# Patient Record
Sex: Male | Born: 1935 | Race: White | Hispanic: No | Smoking: Never smoker
Health system: Southern US, Community
[De-identification: ages and names within clinical notes are randomized; demographics above are authoritative.]

## PROBLEM LIST (undated history)

## (undated) DIAGNOSIS — I219 Acute myocardial infarction, unspecified: Secondary | ICD-10-CM

## (undated) DIAGNOSIS — I499 Cardiac arrhythmia, unspecified: Secondary | ICD-10-CM

## (undated) DIAGNOSIS — I4891 Unspecified atrial fibrillation: Secondary | ICD-10-CM

## (undated) DIAGNOSIS — A419 Sepsis, unspecified organism: Secondary | ICD-10-CM

## (undated) DIAGNOSIS — U071 COVID-19: Secondary | ICD-10-CM

## (undated) DIAGNOSIS — L97519 Non-pressure chronic ulcer of other part of right foot with unspecified severity: Secondary | ICD-10-CM

## (undated) DIAGNOSIS — E119 Type 2 diabetes mellitus without complications: Secondary | ICD-10-CM

## (undated) DIAGNOSIS — Z8673 Personal history of transient ischemic attack (TIA), and cerebral infarction without residual deficits: Secondary | ICD-10-CM

## (undated) DIAGNOSIS — C801 Malignant (primary) neoplasm, unspecified: Secondary | ICD-10-CM

## (undated) DIAGNOSIS — I714 Abdominal aortic aneurysm, without rupture: Secondary | ICD-10-CM

## (undated) DIAGNOSIS — N200 Calculus of kidney: Secondary | ICD-10-CM

## (undated) DIAGNOSIS — M1990 Unspecified osteoarthritis, unspecified site: Secondary | ICD-10-CM

## (undated) DIAGNOSIS — F0390 Unspecified dementia without behavioral disturbance: Secondary | ICD-10-CM

## (undated) DIAGNOSIS — I10 Essential (primary) hypertension: Secondary | ICD-10-CM

## (undated) DIAGNOSIS — I639 Cerebral infarction, unspecified: Secondary | ICD-10-CM

## (undated) DIAGNOSIS — H9190 Unspecified hearing loss, unspecified ear: Secondary | ICD-10-CM

## (undated) DIAGNOSIS — L97529 Non-pressure chronic ulcer of other part of left foot with unspecified severity: Secondary | ICD-10-CM

## (undated) DIAGNOSIS — E785 Hyperlipidemia, unspecified: Secondary | ICD-10-CM

## (undated) DIAGNOSIS — Z87442 Personal history of urinary calculi: Secondary | ICD-10-CM

## (undated) HISTORY — PX: EYE SURGERY: SHX253

## (undated) HISTORY — DX: Calculus of kidney: N20.0

## (undated) HISTORY — DX: Essential (primary) hypertension: I10

## (undated) HISTORY — DX: Abdominal aortic aneurysm, without rupture: I71.4

## (undated) HISTORY — PX: LITHOTRIPSY: SUR834

## (undated) HISTORY — DX: Unspecified atrial fibrillation: I48.91

## (undated) HISTORY — PX: CHOLECYSTECTOMY: SHX55

## (undated) HISTORY — DX: Personal history of transient ischemic attack (TIA), and cerebral infarction without residual deficits: Z86.73

---

## 1998-03-18 ENCOUNTER — Emergency Department (HOSPITAL_COMMUNITY): Admission: EM | Admit: 1998-03-18 | Discharge: 1998-03-18 | Payer: Self-pay | Admitting: Emergency Medicine

## 2004-01-28 ENCOUNTER — Encounter (INDEPENDENT_AMBULATORY_CARE_PROVIDER_SITE_OTHER): Payer: Self-pay | Admitting: *Deleted

## 2004-01-28 ENCOUNTER — Ambulatory Visit (HOSPITAL_COMMUNITY): Admission: RE | Admit: 2004-01-28 | Discharge: 2004-01-28 | Payer: Self-pay | Admitting: Gastroenterology

## 2009-05-11 DIAGNOSIS — Z8673 Personal history of transient ischemic attack (TIA), and cerebral infarction without residual deficits: Secondary | ICD-10-CM

## 2009-05-11 HISTORY — DX: Personal history of transient ischemic attack (TIA), and cerebral infarction without residual deficits: Z86.73

## 2009-05-17 ENCOUNTER — Ambulatory Visit: Payer: Self-pay | Admitting: Internal Medicine

## 2009-05-17 ENCOUNTER — Inpatient Hospital Stay (HOSPITAL_COMMUNITY): Admission: EM | Admit: 2009-05-17 | Discharge: 2009-05-19 | Payer: Self-pay | Admitting: Emergency Medicine

## 2009-05-19 ENCOUNTER — Encounter (INDEPENDENT_AMBULATORY_CARE_PROVIDER_SITE_OTHER): Payer: Self-pay | Admitting: Internal Medicine

## 2011-03-20 LAB — CBC
HCT: 32.1 % — ABNORMAL LOW (ref 39.0–52.0)
HCT: 37.7 % — ABNORMAL LOW (ref 39.0–52.0)
Hemoglobin: 11.3 g/dL — ABNORMAL LOW (ref 13.0–17.0)
Hemoglobin: 11.4 g/dL — ABNORMAL LOW (ref 13.0–17.0)
MCHC: 35 g/dL (ref 30.0–36.0)
MCHC: 35 g/dL (ref 30.0–36.0)
MCHC: 35.1 g/dL (ref 30.0–36.0)
MCV: 92 fL (ref 78.0–100.0)
MCV: 92.1 fL (ref 78.0–100.0)
MCV: 92.5 fL (ref 78.0–100.0)
Platelets: 164 10*3/uL (ref 150–400)
Platelets: 206 10*3/uL (ref 150–400)
RBC: 3.48 MIL/uL — ABNORMAL LOW (ref 4.22–5.81)
RBC: 3.54 MIL/uL — ABNORMAL LOW (ref 4.22–5.81)
RDW: 13.2 % (ref 11.5–15.5)
RDW: 13.5 % (ref 11.5–15.5)
WBC: 7.6 10*3/uL (ref 4.0–10.5)
WBC: 8.6 10*3/uL (ref 4.0–10.5)

## 2011-03-20 LAB — COMPREHENSIVE METABOLIC PANEL
ALT: 20 U/L (ref 0–53)
AST: 18 U/L (ref 0–37)
Albumin: 3 g/dL — ABNORMAL LOW (ref 3.5–5.2)
Albumin: 3.7 g/dL (ref 3.5–5.2)
Alkaline Phosphatase: 26 U/L — ABNORMAL LOW (ref 39–117)
BUN: 22 mg/dL (ref 6–23)
BUN: 34 mg/dL — ABNORMAL HIGH (ref 6–23)
CO2: 26 mEq/L (ref 19–32)
Calcium: 8.9 mg/dL (ref 8.4–10.5)
Calcium: 9.5 mg/dL (ref 8.4–10.5)
Chloride: 109 mEq/L (ref 96–112)
Chloride: 111 mEq/L (ref 96–112)
Creatinine, Ser: 1.1 mg/dL (ref 0.4–1.5)
Creatinine, Ser: 1.56 mg/dL — ABNORMAL HIGH (ref 0.4–1.5)
GFR calc Af Amer: >60 mL/min (ref >60)
GFR calc non Af Amer: >60 mL/min (ref >60)
Glucose, Bld: 118 mg/dL — ABNORMAL HIGH (ref 70–99)
Potassium: 4.1 mEq/L (ref 3.5–5.1)
Sodium: 144 mEq/L (ref 135–145)
Total Bilirubin: 0.6 mg/dL (ref 0.3–1.2)
Total Bilirubin: 0.7 mg/dL (ref 0.3–1.2)
Total Protein: 5.8 g/dL — ABNORMAL LOW (ref 6.0–8.3)

## 2011-03-20 LAB — URINALYSIS, ROUTINE W REFLEX MICROSCOPIC
Bilirubin Urine: NEGATIVE
Nitrite: NEGATIVE
Specific Gravity, Urine: 1.019 (ref 1.005–1.030)
Urobilinogen, UA: 0.2 mg/dL (ref 0.0–1.0)

## 2011-03-20 LAB — DIFFERENTIAL
Basophils Absolute: 0 10*3/uL (ref 0.0–0.1)
Lymphocytes Relative: 23 % (ref 12–46)
Lymphs Abs: 2 10*3/uL (ref 0.7–4.0)
Monocytes Absolute: 0.7 10*3/uL (ref 0.1–1.0)
Neutro Abs: 5.7 10*3/uL (ref 1.7–7.7)

## 2011-03-20 LAB — BASIC METABOLIC PANEL
CO2: 25 mEq/L (ref 19–32)
Chloride: 110 mEq/L (ref 96–112)
GFR calc Af Amer: >60 mL/min (ref >60)
Sodium: 142 mEq/L (ref 135–145)

## 2011-03-20 LAB — HEMOGLOBIN A1C
Hgb A1c MFr Bld: 7 % — ABNORMAL HIGH (ref 4.6–6.1)
Mean Plasma Glucose: 154 mg/dL

## 2011-03-20 LAB — CARDIAC PANEL(CRET KIN+CKTOT+MB+TROPI)
CK, MB: 1.4 ng/mL (ref 0.3–4.0)
Relative Index: INVALID (ref 0.0–2.5)
Total CK: 74 U/L (ref 7–232)
Troponin I: 0.01 ng/mL (ref 0.00–0.06)

## 2011-03-20 LAB — GLUCOSE, CAPILLARY
Glucose-Capillary: 103 mg/dL — ABNORMAL HIGH (ref 70–99)
Glucose-Capillary: 117 mg/dL — ABNORMAL HIGH (ref 70–99)
Glucose-Capillary: 125 mg/dL — ABNORMAL HIGH (ref 70–99)
Glucose-Capillary: 147 mg/dL — ABNORMAL HIGH (ref 70–99)

## 2011-03-20 LAB — POCT CARDIAC MARKERS: Myoglobin, poc: 144 ng/mL (ref 12–200)

## 2011-03-20 LAB — LIPID PANEL: Cholesterol: 146 mg/dL (ref 0–200)

## 2011-03-20 LAB — LIPASE, BLOOD: Lipase: 28 U/L (ref 11–59)

## 2011-03-20 LAB — TSH: TSH: 0.82 u[IU]/mL (ref 0.350–4.500)

## 2011-04-25 NOTE — Discharge Summary (Signed)
NAMEJHALIL, Charles Richard              ACCOUNT NO.:  [id number]   MEDICAL RECORD NO.:  [id number]          PATIENT TYPE:  INP   LOCATION:  5511                         FACILITY:  [facility]   PHYSICIAN:  Renee Ramus, MD       DATE OF BIRTH:  1930/11/10   DATE OF ADMISSION:  05/17/2009  DATE OF DISCHARGE:                               DISCHARGE SUMMARY   PRIMARY DISCHARGE DIAGNOSIS:  Acute cerebrovascular accident.   SECONDARY DIAGNOSES:  1. Near syncopal episode.  2. Hypertension.  3. Diabetes mellitus, type 2.  4. Sinus bradycardia.  5. Obesity.   HOSPITAL COURSE:  1. CVA.  The patient is a 77 year old male who was admitted secondary      to syncopal episode followed by confusion and expressive aphasia.      The patient was seen in the emergency department, was admitted to      our service.  The patient had a CT scan of the head that showed      nothing and an MRI of the head that showed acute small posterior      circulation CVA.  The patient had a MRA that showed no      hemodynamically significant stenosis.  The patient had a lipid      panel, which showed that he is already in goal LDL and he is on      statin therapy currently.  The patient had an echocardiogram that      is currently pending.  It has been completed however.  The patient      does not appear to require physical therapy; however, he will      receive a physical therapy evaluation prior to discharge and if      needed we will refer for inpatient PT/OT.  The patient is currently      stable.  He is on aspirin.  He did come in on aspirin; however, I      believe that his preceding syncopal episode triggered his stroke      and because of this and financial reasons, we are not going to      change him to Plavix or combination of aspirin, dipyridamole,      Plavix, or Aggrenox.  2. Near-syncopal episode.  The patient did experience neurocardiogenic      syncope secondary to dehydration.  He is on Lasix for unknown   reasons.  He is also on beta-blocker.  His creatinine was elevated      upon admission, it has now come down with IV fluids.  We believe      this was a source of his syncopal episode.  3. Hypertension is currently stable.  We will be holding his Coreg at      discharge given his bradycardic episode.  4. Diabetes mellitus, type 2.  The patient is on metformin.  We will      continue this postdischarge unless his discharge creatinine is      greater than 1.6.  5. Sinus brady.  As above, we will discontinue metoprolol.   LABORATORIES OF NOTE:  1. Mild anemia with a hemoglobin of 11.4, hematocrit of 32.6.  2. Blood glucose ranging between 147 and 103.  3. Initial creatinine of 1.56 with repeat creatinine of 1.39.  We have      no idea what his baseline creatinine is.  4. Hemoglobin A1c of 7.  5. Negative troponins x3.  6. Total cholesterol 146, LDL of 101 and HDL of 25.  7. TSH is 0.820.   STUDIES:  1. MRA of the head showing no major vessel occlusion, but      atherosclerotic change was noted in the more distal branches      intracranially.  2. MR of the brain showing small vessel disease with a very small      infarction of posterior limb of the internal capsule to the      external capsule region on the right.  3. Chest x-ray showing no acute disease.  4. CT of the head without contrast showing cerebral atrophy and      periventricular white matter disease, but no acute intracranial      findings or mass lesions.   MEDICATIONS ON DISCHARGE:  1. Lisinopril 20 mg p.o. daily.  2. Ultram plus Tylenol 37.5/325 one p.o. q.8 h. p.r.n. pain.  3. Coreg, which has been discontinued.  4. Lasix, which has been discontinued.  5. Vitamin D 50,000 units p.o. weekly.  6. Multivitamin p.o. daily.  7. Aspirin 81 mg p.o. daily.  8. Metformin 500 mg p.o. b.i.d.  9. Zocor 20 mg p.o. at bedtime.   There are morning labs and the official read of the echocardiogram  pending prior to admission,  otherwise there are no labs or studies  pending.  The patient is in stable condition and ready for admission.   Time spent 35 minutes.      Renee Ramus, MD  Electronically Signed     JF/MEDQ  D:  05/18/2009  T:  05/19/2009  Job:  [id number]

## 2011-04-25 NOTE — H&P (Signed)
Charles Richard, Charles Richard              ACCOUNT NO.:  [id number]   MEDICAL RECORD NO.:  [id number]          PATIENT TYPE:  EMS   LOCATION:  MAJO                         FACILITY:  [facility]   PHYSICIAN:  Eduard Clos, MDDATE OF BIRTH:  Jul 07, 1930   DATE OF ADMISSION:  05/17/2009  DATE OF DISCHARGE:                              HISTORY & PHYSICAL   PRIMARY CARE PHYSICIAN:  [facility] [place].   CHIEF COMPLAINT:  Loss of consciousness.   HISTORY OF PRESENT ILLNESS:  A 77 year old male with a history of  diabetes mellitus type 2, hypertension, who was brought into the ER  after the patient had a brief spell of loss of consciousness, as  witnessed by patient's daughter.  Patient stated he was doing well and  suddenly lost consciousness for over 2 to 3 minutes and woke up with no  focal deficits or any chest pain or shortness of breath.  Patient is  asymptomatic.  He has been admitted for further evaluation and  observation.   Patient denies any chest pain, shortness of breath.  He denies any  fever, chills, or headache.  He denies any focal deficits or any  dysuria, discharge, or abdominal pain.   PAST MEDICAL HISTORY:  1. Hypertension.  2. Diabetes mellitus type 2.   PAST SURGICAL HISTORY:  Gallbladder surgery.   MEDICATIONS PRIOR TO ADMISSION:  1. Lisinopril 20 mg p.o. daily.  2. __________ 50 mg as needed for pain.  3. Carvedilol 25 mg twice daily.  4. Furosemide 40 mg p.o. daily.  5. Vitamin D 50,000 units p.o. weekly.  6. Aspirin 81 mg daily.  7. Metformin 5 mg p.o. b.i.d.   ALLERGIES:  No known drug allergies.   FAMILY HISTORY:  Nothing contributory.   SOCIAL HISTORY:  Patient denies smoking cigarettes.  Drinks alcohol  occasionally.  Denies any drug abuse.   REVIEW OF SYSTEMS:  As per the history of present illness, nothing else  significant.   PHYSICAL EXAMINATION:  Patient examined at bedside.  Not in acute  distress.  VITAL SIGNS:  Blood pressure is 98/55, pulse  50 per minute, temperature  97, respirations 18 per minute, O2 sat 100%.  HEENT:  Anicteric.  No pallor.  No facial asymmetry.  Tongue is midline.  CHEST:  Bilateral air entry present.  No rhonchi, no crepitation.  HEART:  S1 and S2 heard.  ABDOMEN:  Soft.  Nontender.  Bowel sounds heard.  CNS:  Awake, alert and oriented to time, place, and person.  Moves upper  and lower extremities 5/5.  EXTREMITIES:  Peripheral pulses felt.  No edema.   LABS:  EKG, normal sinus rhythm with sinus bradycardia, around 50 per  minute.   CT of the head, nothing acute.   CBC:  WBC is 8.3, hemoglobin 13.2, hematocrit 37.7, platelets 206,  neutrophils 67%.  Complete metabolic panel:  Sodium 143, potassium 4.3,  chloride 109, carbon dioxide 26, glucose 187, BUN 34, creatinine 1.5,  alk phos 35, AST 24, ALT 28, total protein 7.1, calcium 9.5, lipase 28,  lactic acid 2.9.  Troponin I less than 0.05.  CK-MB  1.2.   ASSESSMENT:  1. Syncope:  Will have to rule out cardiologic or neurological      process.  2. Dehydration.  3. Diabetes mellitus type 2.  4. Hypertension.  5. Sinus bradycardia.   PLAN:  1. Admit patient to telemetry to rule out any arrhythmias and observe      for any further worsening of the heart rate.  2. Will decrease his carvedilol dose to 12.5 b.i.d.  Presently, he is      on 25.  3. Will gently hydrate and hold his Lasix.  4. Will get a 2D echo, cardiac enzymes, get MRI of the brain along      with MRA.  5. Will hold Metformin and place on CBGs.  Continue aspirin.   Further recommendations as patient's condition evolves.      Eduard Clos, MD  Electronically Signed     ANK/MEDQ  D:  05/17/2009  T:  05/18/2009  Job:  [id number]

## 2011-04-25 NOTE — Discharge Summary (Signed)
Charles Richard, Charles Richard              ACCOUNT NO.:  [id number]   MEDICAL RECORD NO.:  [id number]          PATIENT TYPE:  INP   LOCATION:  5511                         FACILITY:  [facility]   PHYSICIAN:  Hind I Elsaid, MD      DATE OF BIRTH:  09-09-1930   DATE OF ADMISSION:  05/17/2009  DATE OF DISCHARGE:  05/19/2009                               DISCHARGE SUMMARY   DISCHARGE DIAGNOSIS:  Remain the same as dictated by Dr. Janice Norrie.   DISCHARGE MEDICATIONS:  1. Aspirin 325 mg p.o. daily.  2. Lisinopril 20 mg p.o. daily.  3. Coreg dose will decrease to 3.125 mg p.o. b.i.d.  The patient may      have rebound tachycardia, so we will decrease the dose rather than      stopping it.  4. Lasix will be discontinued.  5. Multivitamin 1 tablet p.o. daily.  6. Metformin 500 mg p.o. b.i.d.  7. Zocor 20 mg p.o. at bedtime.   A 2-D echo is pending at this time.  The patient is to follow up with  his primary care next week.  Physical Therapy to see and evaluate the  patient.  Recommend home health PT.  At this time, we felt the patient  is medically stable to be discharged home.      [name], MD  Electronically Signed     HIE/MEDQ  D:  05/19/2009  T:  05/20/2009  Job:  [id number]

## 2011-04-28 NOTE — Op Note (Signed)
NAME:  Charles Richard, Charles Richard                        ACCOUNT NO.:  [id number]   MEDICAL RECORD NO.:  [id number]                   PATIENT TYPE:  AMB   LOCATION:  ENDO                                 FACILITY:  [facility]   PHYSICIAN:  James L. Malon Kindle., M.D.          DATE OF BIRTH:  September 01, 1930   DATE OF PROCEDURE:  01/28/2004  DATE OF DISCHARGE:                                 OPERATIVE REPORT   PROCEDURE PERFORMED:  Colonoscopy and polypectomy.   ENDOSCOPIST:  Llana Aliment. Edwards, M.D.   MEDICATIONS:  Fentanyl 50 mcg, Versed 5 mg IV.   INSTRUMENT USED:  Adult Olympus colonoscope.   INDICATIONS FOR PROCEDURE:  Heme positive stool.   DESCRIPTION OF PROCEDURE:  The procedure had been explained to the patient  and consent obtained.  With the patient in the left lateral decubitus  position, the Olympus scope was inserted and advanced.  The prep was quite  good and we were able to reach the cecum.  There was a 1 cm sessile polyp in  the ascending colon above the ileocecal valve.  This was snared.  Next to it  was a 5 mm sessile polyp that was snared and sucked through the scope.  The  first polyp was too large to suck through the scope.  We attempted to  retrieve it with a tripod retriever; however, it continued to malfunction.  We threw it away and used the [employer] and retrieved the polyp, examined  the colon upon removed and the remainder of the ascending colon, transverse  colon, descending and sigmoid colon were seen well.  We reinserted the scope  back and advanced back up to what was felt to be the proximal transverse  colon and re-examined it again upon removal.  Scattered diverticula in the  sigmoid colon.  No polyps seen in the rectum.  A 0.5 cm sessile polyp was  removed with the snare and sucked through the scope.  No other polyps were  seen in the rectum.  The scope was withdrawn.  The patient tolerated the  procedure well.   ASSESSMENT:  Colon polyps removed, 211.3.   PLAN:   Routine postpolypectomy instructions.  Will likely need to repeat  procedure in two to three years.                                               James L. Malon Kindle., M.D.    Waldron Session  D:  01/28/2004  T:  01/28/2004  Job:  [id number]   cc:   Aida Puffer  [address]  [place]  [place] [address]  Fax: [phone]

## 2013-04-07 ENCOUNTER — Encounter (INDEPENDENT_AMBULATORY_CARE_PROVIDER_SITE_OTHER): Payer: Medicare Other | Admitting: Ophthalmology

## 2013-04-07 DIAGNOSIS — H35039 Hypertensive retinopathy, unspecified eye: Secondary | ICD-10-CM

## 2013-04-07 DIAGNOSIS — H2510 Age-related nuclear cataract, unspecified eye: Secondary | ICD-10-CM

## 2013-04-07 DIAGNOSIS — E11319 Type 2 diabetes mellitus with unspecified diabetic retinopathy without macular edema: Secondary | ICD-10-CM

## 2013-04-07 DIAGNOSIS — E1139 Type 2 diabetes mellitus with other diabetic ophthalmic complication: Secondary | ICD-10-CM

## 2013-04-07 DIAGNOSIS — I10 Essential (primary) hypertension: Secondary | ICD-10-CM

## 2013-04-07 DIAGNOSIS — H43819 Vitreous degeneration, unspecified eye: Secondary | ICD-10-CM

## 2013-04-07 DIAGNOSIS — H01009 Unspecified blepharitis unspecified eye, unspecified eyelid: Secondary | ICD-10-CM

## 2017-04-10 DIAGNOSIS — I4891 Unspecified atrial fibrillation: Secondary | ICD-10-CM

## 2017-04-10 DIAGNOSIS — N200 Calculus of kidney: Secondary | ICD-10-CM

## 2017-04-10 HISTORY — DX: Calculus of kidney: N20.0

## 2017-04-10 HISTORY — DX: Unspecified atrial fibrillation: I48.91

## 2017-05-07 ENCOUNTER — Emergency Department (HOSPITAL_COMMUNITY)
Admission: EM | Admit: 2017-05-07 | Discharge: 2017-05-07 | Disposition: A | Discharge status: Home / Self Care | Payer: Medicare Other | Attending: Emergency Medicine | Admitting: Emergency Medicine

## 2017-05-07 ENCOUNTER — Encounter (HOSPITAL_COMMUNITY): Payer: Self-pay | Admitting: *Deleted

## 2017-05-07 ENCOUNTER — Emergency Department (HOSPITAL_COMMUNITY): Payer: Medicare Other

## 2017-05-07 DIAGNOSIS — N132 Hydronephrosis with renal and ureteral calculous obstruction: Secondary | ICD-10-CM | POA: Insufficient documentation

## 2017-05-07 DIAGNOSIS — R9389 Abnormal findings on diagnostic imaging of other specified body structures: Secondary | ICD-10-CM

## 2017-05-07 DIAGNOSIS — R935 Abnormal findings on diagnostic imaging of other abdominal regions, including retroperitoneum: Secondary | ICD-10-CM | POA: Insufficient documentation

## 2017-05-07 DIAGNOSIS — E119 Type 2 diabetes mellitus without complications: Secondary | ICD-10-CM | POA: Insufficient documentation

## 2017-05-07 DIAGNOSIS — I10 Essential (primary) hypertension: Secondary | ICD-10-CM | POA: Insufficient documentation

## 2017-05-07 DIAGNOSIS — N289 Disorder of kidney and ureter, unspecified: Secondary | ICD-10-CM | POA: Diagnosis not present

## 2017-05-07 DIAGNOSIS — R109 Unspecified abdominal pain: Secondary | ICD-10-CM | POA: Diagnosis present

## 2017-05-07 DIAGNOSIS — N201 Calculus of ureter: Secondary | ICD-10-CM

## 2017-05-07 HISTORY — DX: Type 2 diabetes mellitus without complications: E11.9

## 2017-05-07 LAB — COMPREHENSIVE METABOLIC PANEL
ALT: 14 U/L — ABNORMAL LOW (ref 17–63)
AST: 26 U/L (ref 15–41)
Albumin: 3.8 g/dL (ref 3.5–5.0)
Alkaline Phosphatase: 45 U/L (ref 38–126)
Anion gap: 12 (ref 5–15)
BUN: 30 mg/dL — ABNORMAL HIGH (ref 6–20)
CO2: 21 mmol/L — ABNORMAL LOW (ref 22–32)
Calcium: 9.5 mg/dL (ref 8.9–10.3)
Chloride: 104 mmol/L (ref 101–111)
Creatinine, Ser: 2.21 mg/dL — ABNORMAL HIGH (ref 0.61–1.24)
GFR calc Af Amer: 30 mL/min — ABNORMAL LOW (ref >60)
GFR calc non Af Amer: 26 mL/min — ABNORMAL LOW (ref >60)
Glucose, Bld: 177 mg/dL — ABNORMAL HIGH (ref 65–99)
Potassium: 4.5 mmol/L (ref 3.5–5.1)
Sodium: 137 mmol/L (ref 135–145)
Total Bilirubin: 1.1 mg/dL (ref 0.3–1.2)
Total Protein: 7.2 g/dL (ref 6.5–8.1)

## 2017-05-07 LAB — CBC
HCT: 41.5 % (ref 39.0–52.0)
Hemoglobin: 14.3 g/dL (ref 13.0–17.0)
MCH: 32.1 pg (ref 26.0–34.0)
MCHC: 34.5 g/dL (ref 30.0–36.0)
MCV: 93 fL (ref 78.0–100.0)
Platelets: 241 10*3/uL (ref 150–400)
RBC: 4.46 MIL/uL (ref 4.22–5.81)
RDW: 12.6 % (ref 11.5–15.5)
WBC: 13.8 10*3/uL — ABNORMAL HIGH (ref 4.0–10.5)

## 2017-05-07 LAB — LIPASE, BLOOD: Lipase: 27 U/L (ref 11–51)

## 2017-05-07 MED ORDER — OXYCODONE-ACETAMINOPHEN 5-325 MG PO TABS: 1.0000 | ORAL_TABLET | ORAL | 0 refills | Status: DC | PRN

## 2017-05-07 MED ORDER — ONDANSETRON 4 MG PO TBDP
4.0000 mg | ORAL_TABLET | Freq: Once | ORAL | Status: AC
  Administered 2017-05-07: 4 mg via ORAL

## 2017-05-07 MED ORDER — HYDROMORPHONE HCL 1 MG/ML IJ SOLN
1.0000 mg | Freq: Once | INTRAMUSCULAR | Status: AC
  Administered 2017-05-07: 1 mg via INTRAVENOUS
  Filled 2017-05-07: qty 1

## 2017-05-07 MED ORDER — ONDANSETRON HCL 4 MG/2ML IJ SOLN
4.0000 mg | Freq: Once | INTRAMUSCULAR | Status: AC
  Administered 2017-05-07: 4 mg via INTRAVENOUS
  Filled 2017-05-07: qty 2

## 2017-05-07 MED ORDER — ONDANSETRON 4 MG PO TBDP
ORAL_TABLET | ORAL | Status: AC
  Filled 2017-05-07: qty 1

## 2017-05-07 MED ORDER — PROMETHAZINE HCL 25 MG/ML IJ SOLN
12.5000 mg | Freq: Once | INTRAMUSCULAR | Status: AC
  Administered 2017-05-07: 12.5 mg via INTRAVENOUS
  Filled 2017-05-07: qty 1

## 2017-05-07 MED ORDER — TAMSULOSIN HCL 0.4 MG PO CAPS: 0.4000 mg | ORAL_CAPSULE | Freq: Every day | ORAL | 0 refills | Status: DC

## 2017-05-07 MED ORDER — ONDANSETRON HCL 4 MG PO TABS: 4.0000 mg | ORAL_TABLET | Freq: Four times a day (QID) | ORAL | 0 refills | Status: DC

## 2017-05-07 MED ORDER — SODIUM CHLORIDE 0.9 % IV BOLUS (SEPSIS)
1000.0000 mL | Freq: Once | INTRAVENOUS | Status: AC
  Administered 2017-05-07: 1000 mL via INTRAVENOUS

## 2017-05-07 NOTE — ED Notes (Signed)
Got patient undress on the monitor family at beside

## 2017-05-07 NOTE — Discharge Instructions (Signed)
Take pain/nausea medicine as needed. Drink less Diet Pepsi and more water. If you're symptoms are not controlled with pain/nausea medication then return to the ER. If you can, [facility] because urology prefers to operate from there if you would end up needing a procedure. You need further evaluation of two other abnormalities noted on your CT scan today  but I do not think are the cause of your current symptoms. One spot may be an aneurysm. You need to stay well hydrated and have your kidney function checked in a week. If your kidney function improves enough then your need a CT angiogram (blood vessel study). You family doctor can order this. If you cannot have a CT angiogram then you need an ultrasound. The other spot may simply be an enlarged lymph node. You need a repeat CT scan to reassess this as well in 3-6 months to see if it has changed.

## 2017-05-07 NOTE — ED Provider Notes (Signed)
[facility]ENCY DEPT Provider Note   CSN: [id number] Arrival date & time: 05/07/17  1134  By signing my name below, I, Charles Richard, attest that this documentation has been prepared under the direction and in the presence of Raeford Razor, MD . Electronically Signed: Freida Richard, Scribe. 05/07/2017. 1:23 PM.   History   Chief Complaint Chief Complaint  Patient presents with  . Abdominal Pain  . Emesis    The history is provided by the patient. No language interpreter was used.   HPI Comments:  Charles Richard is a 83 y.o. male who presents to the Emergency Department complaining of waxing and waning left sided abdominal pain that radiates around to his left flank x 2 days. He notes pain today is similar to pain felt with past kidney stones. He reports associated nausea and vomiting.  He has taken tramadol with mild relief. He denies fever, dysuria, hematuria, and recent change in BM.   Past Medical History:  Diagnosis Date  . Diabetes mellitus without complication (HCC)   . Hypertension   . Renal disorder    kidney stones    There are no active problems to display for this patient.   Past Surgical History:  Procedure Laterality Date  . CHOLECYSTECTOMY         Home Medications    Prior to Admission medications   Not on File    Family History No family history on file.  Social History Social History  Substance Use Topics  . Smoking status: Never Smoker  . Smokeless tobacco: Never Used  . Alcohol use No     Allergies   Patient has no known allergies.   Review of Systems Review of Systems  Constitutional: Negative for fever.  Gastrointestinal: Positive for abdominal pain, nausea and vomiting. Negative for constipation and diarrhea.  Genitourinary: Positive for flank pain. Negative for dysuria and hematuria.  All other systems reviewed and are negative.    Physical Exam Updated Vital Signs BP 140/72 (BP Location: Right Arm)   Pulse 98   Temp  98.8 F (37.1 C) (Oral)   Resp 20   Ht 6' (1.829 m)   Wt 222 lb (100.7 kg)   SpO2 98%   BMI 30.11 kg/m   Physical Exam  Constitutional: He is oriented to person, place, and time. He appears well-developed and well-nourished.  HENT:  Head: Normocephalic and atraumatic.  Eyes: EOM are normal.  Neck: Normal range of motion.  Cardiovascular: Normal rate, regular rhythm, normal heart sounds and intact distal pulses.   Pulmonary/Chest: Effort normal and breath sounds normal. No respiratory distress.  Abdominal: Soft. He exhibits no distension. There is no tenderness. There is no CVA tenderness.  Musculoskeletal: Normal range of motion.  Neurological: He is alert and oriented to person, place, and time.  Skin: Skin is warm and dry.  Psychiatric: He has a normal mood and affect. Judgment normal.  Nursing note and vitals reviewed.    ED Treatments / Results  DIAGNOSTIC STUDIES:  Oxygen Saturation is 98% on RA, normal by my interpretation.    COORDINATION OF CARE:  1:23 PM Discussed treatment plan with pt at bedside and pt agreed to plan.  Labs (all labs ordered are listed, but only abnormal results are displayed) Labs Reviewed  COMPREHENSIVE METABOLIC PANEL - Abnormal; Notable for the following:       Result Value   CO2 21 (*)    Glucose, Bld 177 (*)    BUN 30 (*)  Creatinine, Ser 2.21 (*)    ALT 14 (*)    GFR calc non Af Amer 26 (*)    GFR calc Af Amer 30 (*)    All other components within normal limits  CBC - Abnormal; Notable for the following:    WBC 13.8 (*)    All other components within normal limits  LIPASE, BLOOD    EKG  EKG Interpretation None       Radiology Ct Renal Stone Study  Result Date: 05/07/2017 CLINICAL DATA:  Acute right lower quadrant abdominal and flank pain. EXAM: CT ABDOMEN AND PELVIS WITHOUT CONTRAST TECHNIQUE: Multidetector CT imaging of the abdomen and pelvis was performed following the standard protocol without IV contrast.  COMPARISON:  None. FINDINGS: Lower chest: No acute abnormality. Hepatobiliary: No focal liver abnormality is seen. No gallstones, gallbladder wall thickening, or biliary dilatation. Pancreas: Unremarkable. No pancreatic ductal dilatation or surrounding inflammatory changes. 16 x 10 mm probable lymph node is seen anterior and superior to pancreatic body. Spleen: Normal in size without focal abnormality. Adrenals/Urinary Tract: Adrenal glands are unremarkable. Two simple cysts arise from upper pole of right kidney, the largest measuring 5 cm. Left nephrolithiasis is noted. Mild left hydronephrosis is noted secondary to 2 calculi in middle portion of left ureter, the largest measuring 7 mm. Urinary bladder is unremarkable. Stomach/Bowel: Stomach is within normal limits. Appendix appears normal. No evidence of bowel wall thickening, distention, or inflammatory changes. Vascular/Lymphatic: 3.3 cm rounded density with calcific rim is noted anterior to the third portion of the duodenum. This is concerning for possible aneurysm. Reproductive: Prostate is unremarkable. Other: Small fat containing periumbilical hernia is noted. No abdominopelvic ascites. Musculoskeletal: Severe multilevel degenerative disc disease is noted in the lumbar spine. No acute abnormality is noted. IMPRESSION: Left nephrolithiasis. Mild left hydronephrosis secondary to 2 calculi in the middle portion of the left ureter, the largest measuring 7 mm. Aortic atherosclerosis. Small fat containing periumbilical hernia. 16 x 10 mm lobulated density is seen superior and anterior to pancreatic body most consistent with lymph node. Follow-up CT scan in 3-6 months is recommended to ensure stability and rule out malignancy. 3.3 cm rounded density is seen anterior to third portion of the duodenum with calcific rim which may represent calcified diverticulum, but large calcified aneurysm cannot be excluded. CT angiography of the abdomen is recommended for further  evaluation. Electronically Signed   By: Lupita Raider, M.D.   On: 05/07/2017 15:18    Procedures Procedures (including critical care time)  Medications Ordered in ED Medications  ondansetron (ZOFRAN-ODT) disintegrating tablet 4 mg (4 mg Oral Given 05/07/17 1219)  sodium chloride 0.9 % bolus 1,000 mL (0 mLs Intravenous Stopped 05/07/17 1550)  ondansetron (ZOFRAN) injection 4 mg (4 mg Intravenous Given 05/07/17 1419)  HYDROmorphone (DILAUDID) injection 1 mg (1 mg Intravenous Given 05/07/17 1420)  promethazine (PHENERGAN) injection 12.5 mg (12.5 mg Intravenous Given 05/07/17 1550)     Initial Impression / Assessment and Plan / ED Course  I have reviewed the triage vital signs and the nursing notes.  Pertinent labs & imaging results that were available during my care of the patient were reviewed by me and considered in my medical decision making (see chart for details).     86yM with L flank pain. CT with L ureteral stones. Symptoms consistent with this.   Incidental findings noted. Cannot obtain CTa 2/2 renal function. I do not think his presenting symptoms are from this regardless if an aneurysm or not.  Last labs for comparison from 8 years ago and I'm not sure of chronicity of renal impairment. Will hydrate in ED. Assuming symptoms controlled, will DC with urology FU. PCP FU to reassess renal function in a week and obtain CTa if appropriate. If not, US. 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  He is feeling better now I think he down plays his symptoms. Says his pain is just an ache now. Did vomit again after zofran but feeling better after phenergan. Has not provided a urine specimen but doesn't want to wait any longer. He is afebrile/nontoxic and HD stable. Abdominal exam remains benign and he has no CVA tenderness.   Pt and daughter were advised of renal impairment, and two additional radiographic abnormalities. [facility]. Advised he needs repeat blood work  within a week to reassess his renal function. He needs to drink less Pepsi and more water. Needs CTa for possible aneurysm if renal function improved enough. If it hasn't then needs an US. Needs repeat imaging in 3-6 months for mass noted in front of pancreas.  PRN pain/nausea meds otherwise and expected management. Return precautions discussed. If he needs to return to the ER, advised to try to [facility] if he can but can certainly can go to any other.   Final Clinical Impressions(s) / ED Diagnoses   Final diagnoses:  Left ureteral stone  Abnormal radiographic examination  Renal impairment    New Prescriptions New Prescriptions   No medications on file   I personally preformed the services scribed in my presence. The recorded information has been reviewed is accurate. Raeford RazorStephen Deborah Lazcano, MD.     Raeford RazorKohut, Johnrobert Foti, MD 05/07/17 [phone]

## 2017-05-07 NOTE — ED Triage Notes (Signed)
PT c/o RLQ pain and R lower back pain and vomiting since yesterday.  Denies changes in bowel and bladder habits.  States feels like a kidney stone.

## 2017-05-07 NOTE — ED Notes (Signed)
ED Physician at bedside  

## 2017-05-07 NOTE — ED Notes (Signed)
PT states understanding of care given, follow up care, and medication prescribed. PT ambulated from ED to car with a steady gait. 

## 2017-05-07 NOTE — ED Notes (Signed)
Pt transported to CT at this time.

## 2017-05-07 NOTE — ED Notes (Signed)
Patient transported to CT 

## 2017-05-09 ENCOUNTER — Encounter (HOSPITAL_COMMUNITY): Payer: Self-pay

## 2017-05-09 ENCOUNTER — Observation Stay (HOSPITAL_COMMUNITY): Payer: Medicare Other | Admitting: Certified Registered"

## 2017-05-09 ENCOUNTER — Observation Stay (HOSPITAL_COMMUNITY): Payer: Medicare Other

## 2017-05-09 ENCOUNTER — Observation Stay (HOSPITAL_BASED_OUTPATIENT_CLINIC_OR_DEPARTMENT_OTHER): Payer: Medicare Other

## 2017-05-09 ENCOUNTER — Observation Stay (HOSPITAL_COMMUNITY)
Admission: EM | Admit: 2017-05-09 | Discharge: 2017-05-11 | Disposition: A | Discharge status: Home Health | Payer: Medicare Other | Attending: Internal Medicine | Admitting: Internal Medicine

## 2017-05-09 ENCOUNTER — Other Ambulatory Visit: Payer: Self-pay | Admitting: Urology

## 2017-05-09 ENCOUNTER — Encounter (HOSPITAL_COMMUNITY)
Admission: EM | Disposition: A | Discharge status: Home Health | Payer: Self-pay | Source: Home / Self Care | Attending: Emergency Medicine

## 2017-05-09 DIAGNOSIS — N139 Obstructive and reflux uropathy, unspecified: Secondary | ICD-10-CM | POA: Insufficient documentation

## 2017-05-09 DIAGNOSIS — Z79899 Other long term (current) drug therapy: Secondary | ICD-10-CM | POA: Insufficient documentation

## 2017-05-09 DIAGNOSIS — N390 Urinary tract infection, site not specified: Secondary | ICD-10-CM | POA: Insufficient documentation

## 2017-05-09 DIAGNOSIS — E860 Dehydration: Secondary | ICD-10-CM | POA: Diagnosis not present

## 2017-05-09 DIAGNOSIS — Z7982 Long term (current) use of aspirin: Secondary | ICD-10-CM | POA: Diagnosis not present

## 2017-05-09 DIAGNOSIS — E119 Type 2 diabetes mellitus without complications: Secondary | ICD-10-CM | POA: Insufficient documentation

## 2017-05-09 DIAGNOSIS — I10 Essential (primary) hypertension: Secondary | ICD-10-CM | POA: Diagnosis not present

## 2017-05-09 DIAGNOSIS — R5381 Other malaise: Secondary | ICD-10-CM

## 2017-05-09 DIAGNOSIS — Z7984 Long term (current) use of oral hypoglycemic drugs: Secondary | ICD-10-CM | POA: Diagnosis not present

## 2017-05-09 DIAGNOSIS — N359 Urethral stricture, unspecified: Secondary | ICD-10-CM | POA: Diagnosis not present

## 2017-05-09 DIAGNOSIS — N132 Hydronephrosis with renal and ureteral calculous obstruction: Principal | ICD-10-CM | POA: Insufficient documentation

## 2017-05-09 DIAGNOSIS — N179 Acute kidney failure, unspecified: Secondary | ICD-10-CM

## 2017-05-09 DIAGNOSIS — I4891 Unspecified atrial fibrillation: Secondary | ICD-10-CM

## 2017-05-09 DIAGNOSIS — E785 Hyperlipidemia, unspecified: Secondary | ICD-10-CM | POA: Diagnosis not present

## 2017-05-09 DIAGNOSIS — R6520 Severe sepsis without septic shock: Secondary | ICD-10-CM | POA: Diagnosis not present

## 2017-05-09 DIAGNOSIS — A419 Sepsis, unspecified organism: Secondary | ICD-10-CM

## 2017-05-09 DIAGNOSIS — Z87442 Personal history of urinary calculi: Secondary | ICD-10-CM | POA: Diagnosis not present

## 2017-05-09 DIAGNOSIS — R0600 Dyspnea, unspecified: Secondary | ICD-10-CM

## 2017-05-09 DIAGNOSIS — E871 Hypo-osmolality and hyponatremia: Secondary | ICD-10-CM | POA: Diagnosis not present

## 2017-05-09 DIAGNOSIS — N200 Calculus of kidney: Secondary | ICD-10-CM | POA: Diagnosis present

## 2017-05-09 HISTORY — PX: CYSTOSCOPY W/ URETERAL STENT PLACEMENT: SHX1429

## 2017-05-09 HISTORY — DX: Unspecified hearing loss, unspecified ear: H91.90

## 2017-05-09 HISTORY — DX: Hyperlipidemia, unspecified: E78.5

## 2017-05-09 LAB — URINALYSIS, ROUTINE W REFLEX MICROSCOPIC
BILIRUBIN URINE: NEGATIVE
GLUCOSE, UA: NEGATIVE mg/dL
HGB URINE DIPSTICK: NEGATIVE
Ketones, ur: 5 mg/dL — AB
Nitrite: NEGATIVE
PH: 5 (ref 5.0–8.0)
PROTEIN: NEGATIVE mg/dL
Specific Gravity, Urine: 1.02 (ref 1.005–1.030)

## 2017-05-09 LAB — ECHOCARDIOGRAM COMPLETE
Height: 72 in
Weight: 3714.31 oz

## 2017-05-09 LAB — PROTIME-INR
INR: 1.2
PROTHROMBIN TIME: 15.3 s — AB (ref 11.4–15.2)

## 2017-05-09 LAB — CBC
HCT: 35.8 % — ABNORMAL LOW (ref 39.0–52.0)
HEMOGLOBIN: 12.2 g/dL — AB (ref 13.0–17.0)
MCH: 31.7 pg (ref 26.0–34.0)
MCHC: 34.1 g/dL (ref 30.0–36.0)
MCV: 93 fL (ref 78.0–100.0)
PLATELETS: 203 10*3/uL (ref 150–400)
RBC: 3.85 MIL/uL — ABNORMAL LOW (ref 4.22–5.81)
RDW: 12.4 % (ref 11.5–15.5)
WBC: 14.8 10*3/uL — ABNORMAL HIGH (ref 4.0–10.5)

## 2017-05-09 LAB — BASIC METABOLIC PANEL
Anion gap: 9 (ref 5–15)
BUN: 40 mg/dL — AB (ref 6–20)
CO2: 21 mmol/L — AB (ref 22–32)
CREATININE: 2.76 mg/dL — AB (ref 0.61–1.24)
Calcium: 8.3 mg/dL — ABNORMAL LOW (ref 8.9–10.3)
Chloride: 103 mmol/L (ref 101–111)
GFR calc Af Amer: 23 mL/min — ABNORMAL LOW (ref >60)
GFR, EST NON AFRICAN AMERICAN: 20 mL/min — AB (ref >60)
GLUCOSE: 152 mg/dL — AB (ref 65–99)
Potassium: 4.5 mmol/L (ref 3.5–5.1)
Sodium: 133 mmol/L — ABNORMAL LOW (ref 135–145)

## 2017-05-09 LAB — LACTIC ACID, PLASMA: Lactic Acid, Venous: 1.2 mmol/L (ref 0.5–1.9)

## 2017-05-09 LAB — GLUCOSE, CAPILLARY
Glucose-Capillary: 129 mg/dL — ABNORMAL HIGH (ref 65–99)
Glucose-Capillary: 146 mg/dL — ABNORMAL HIGH (ref 65–99)
Glucose-Capillary: 128 mg/dL — ABNORMAL HIGH (ref 65–99)

## 2017-05-09 LAB — TSH: TSH: 0.864 u[IU]/mL (ref 0.350–4.500)

## 2017-05-09 LAB — APTT: APTT: 30 s (ref 24–36)

## 2017-05-09 LAB — TROPONIN I: Troponin I: 0.04 ng/mL (ref <0.03)

## 2017-05-09 LAB — LIPASE, BLOOD: Lipase: 20 U/L (ref 11–51)

## 2017-05-09 SURGERY — CYSTOSCOPY, WITH RETROGRADE PYELOGRAM AND URETERAL STENT INSERTION
Anesthesia: General | Site: Ureter | Laterality: Left

## 2017-05-09 MED ORDER — POLYVINYL ALCOHOL 1.4 % OP SOLN: 1.0000 [drp] | Freq: Every day | OPHTHALMIC | Status: DC | PRN

## 2017-05-09 MED ORDER — ONDANSETRON HCL 4 MG/2ML IJ SOLN
INTRAMUSCULAR | Status: DC | PRN
  Administered 2017-05-09: 4 mg via INTRAVENOUS

## 2017-05-09 MED ORDER — PROMETHAZINE HCL 25 MG/ML IJ SOLN
12.5000 mg | Freq: Once | INTRAMUSCULAR | Status: AC
  Administered 2017-05-09: 12.5 mg via INTRAVENOUS
  Filled 2017-05-09: qty 1

## 2017-05-09 MED ORDER — PERFLUTREN LIPID MICROSPHERE
INTRAVENOUS | Status: AC
  Filled 2017-05-09: qty 10

## 2017-05-09 MED ORDER — OXYCODONE-ACETAMINOPHEN 5-325 MG PO TABS: 2.0000 | ORAL_TABLET | ORAL | Status: DC | PRN

## 2017-05-09 MED ORDER — FENTANYL CITRATE (PF) 100 MCG/2ML IJ SOLN
INTRAMUSCULAR | Status: AC
  Filled 2017-05-09: qty 2

## 2017-05-09 MED ORDER — CEFAZOLIN SODIUM-DEXTROSE 2-4 GM/100ML-% IV SOLN
INTRAVENOUS | Status: AC
  Filled 2017-05-09: qty 100

## 2017-05-09 MED ORDER — METOCLOPRAMIDE HCL 5 MG/ML IJ SOLN: 10.0000 mg | Freq: Once | INTRAMUSCULAR | Status: DC | PRN

## 2017-05-09 MED ORDER — PROPOFOL 10 MG/ML IV BOLUS
INTRAVENOUS | Status: AC
  Filled 2017-05-09: qty 20

## 2017-05-09 MED ORDER — INSULIN ASPART 100 UNIT/ML ~~LOC~~ SOLN
0.0000 [IU] | Freq: Three times a day (TID) | SUBCUTANEOUS | Status: DC
  Administered 2017-05-09 – 2017-05-10 (×2): 1 [IU] via SUBCUTANEOUS
  Administered 2017-05-11: 2 [IU] via SUBCUTANEOUS
  Administered 2017-05-11: 1 [IU] via SUBCUTANEOUS

## 2017-05-09 MED ORDER — DEXTROSE 5 % IV SOLN
INTRAVENOUS | Status: AC
  Filled 2017-05-09: qty 10

## 2017-05-09 MED ORDER — HYDROMORPHONE HCL 1 MG/ML IJ SOLN
1.0000 mg | Freq: Once | INTRAMUSCULAR | Status: AC
  Administered 2017-05-09: 1 mg via INTRAVENOUS
  Filled 2017-05-09: qty 1

## 2017-05-09 MED ORDER — MEPERIDINE HCL 50 MG/ML IJ SOLN: 6.2500 mg | INTRAMUSCULAR | Status: DC | PRN

## 2017-05-09 MED ORDER — CARVEDILOL 6.25 MG PO TABS
6.2500 mg | ORAL_TABLET | Freq: Every day | ORAL | Status: DC
Start: 2017-05-10 — End: 2017-05-11
  Administered 2017-05-10 – 2017-05-11 (×2): 6.25 mg via ORAL
  Filled 2017-05-09 (×2): qty 1

## 2017-05-09 MED ORDER — LORATADINE 10 MG PO TABS
10.0000 mg | ORAL_TABLET | Freq: Every day | ORAL | Status: DC
  Administered 2017-05-10 – 2017-05-11 (×2): 10 mg via ORAL
  Filled 2017-05-09 (×2): qty 1

## 2017-05-09 MED ORDER — MECLIZINE HCL 25 MG PO TABS
25.0000 mg | ORAL_TABLET | Freq: Two times a day (BID) | ORAL | Status: DC
  Administered 2017-05-09 – 2017-05-11 (×5): 25 mg via ORAL
  Filled 2017-05-09 (×5): qty 1

## 2017-05-09 MED ORDER — SIMVASTATIN 20 MG PO TABS
10.0000 mg | ORAL_TABLET | Freq: Every day | ORAL | Status: DC
  Administered 2017-05-10 – 2017-05-11 (×2): 10 mg via ORAL
  Filled 2017-05-09 (×2): qty 1

## 2017-05-09 MED ORDER — ONDANSETRON HCL 4 MG PO TABS: 4.0000 mg | ORAL_TABLET | Freq: Four times a day (QID) | ORAL | Status: DC | PRN

## 2017-05-09 MED ORDER — TRAMADOL HCL 50 MG PO TABS: 100.0000 mg | ORAL_TABLET | Freq: Two times a day (BID) | ORAL | Status: DC | PRN

## 2017-05-09 MED ORDER — ADULT MULTIVITAMIN W/MINERALS CH
1.0000 | ORAL_TABLET | Freq: Every day | ORAL | Status: DC
  Administered 2017-05-09 – 2017-05-10 (×2): 1 via ORAL
  Filled 2017-05-09 (×2): qty 1

## 2017-05-09 MED ORDER — ONDANSETRON HCL 4 MG PO TABS
4.0000 mg | ORAL_TABLET | Freq: Four times a day (QID) | ORAL | Status: DC
  Administered 2017-05-09: 4 mg via ORAL
  Filled 2017-05-09 (×4): qty 1

## 2017-05-09 MED ORDER — SODIUM CHLORIDE 0.9 % IV BOLUS (SEPSIS)
500.0000 mL | Freq: Once | INTRAVENOUS | Status: AC
  Administered 2017-05-09: 500 mL via INTRAVENOUS

## 2017-05-09 MED ORDER — ACETAMINOPHEN 325 MG PO TABS: 650.0000 mg | ORAL_TABLET | Freq: Four times a day (QID) | ORAL | Status: DC | PRN

## 2017-05-09 MED ORDER — HYDROMORPHONE HCL 1 MG/ML IJ SOLN
0.5000 mg | Freq: Once | INTRAMUSCULAR | Status: AC
  Administered 2017-05-09: 0.5 mg via INTRAVENOUS
  Filled 2017-05-09: qty 0.5

## 2017-05-09 MED ORDER — PROPOFOL 10 MG/ML IV BOLUS
INTRAVENOUS | Status: DC | PRN
  Administered 2017-05-09: 100 mg via INTRAVENOUS

## 2017-05-09 MED ORDER — PHENYLEPHRINE 40 MCG/ML (10ML) SYRINGE FOR IV PUSH (FOR BLOOD PRESSURE SUPPORT)
PREFILLED_SYRINGE | INTRAVENOUS | Status: DC | PRN
  Administered 2017-05-09: 160 ug via INTRAVENOUS
  Administered 2017-05-09: 200 ug via INTRAVENOUS
  Administered 2017-05-09: 120 ug via INTRAVENOUS
  Administered 2017-05-09: 80 ug via INTRAVENOUS

## 2017-05-09 MED ORDER — FENTANYL CITRATE (PF) 100 MCG/2ML IJ SOLN
INTRAMUSCULAR | Status: DC | PRN
  Administered 2017-05-09 (×2): 25 ug via INTRAVENOUS
  Administered 2017-05-09: 50 ug via INTRAVENOUS

## 2017-05-09 MED ORDER — ONDANSETRON HCL 4 MG/2ML IJ SOLN
4.0000 mg | Freq: Four times a day (QID) | INTRAMUSCULAR | Status: DC | PRN
  Administered 2017-05-09: 4 mg via INTRAVENOUS
  Filled 2017-05-09: qty 2

## 2017-05-09 MED ORDER — POLYETHYLENE GLYCOL 3350 17 G PO PACK
17.0000 g | PACK | Freq: Every day | ORAL | Status: DC
  Administered 2017-05-10 – 2017-05-11 (×2): 17 g via ORAL
  Filled 2017-05-09 (×2): qty 1

## 2017-05-09 MED ORDER — FENTANYL CITRATE (PF) 100 MCG/2ML IJ SOLN: 25.0000 ug | INTRAMUSCULAR | Status: DC | PRN

## 2017-05-09 MED ORDER — AMLODIPINE BESYLATE 10 MG PO TABS: 10.0000 mg | ORAL_TABLET | Freq: Every day | ORAL | Status: DC

## 2017-05-09 MED ORDER — DEXTROSE 5 % IV SOLN
INTRAVENOUS | Status: DC | PRN
  Administered 2017-05-09: 1 g via INTRAVENOUS

## 2017-05-09 MED ORDER — ACETAMINOPHEN 650 MG RE SUPP: 650.0000 mg | Freq: Four times a day (QID) | RECTAL | Status: DC | PRN

## 2017-05-09 MED ORDER — DILTIAZEM HCL ER 60 MG PO CP12
60.0000 mg | ORAL_CAPSULE | Freq: Two times a day (BID) | ORAL | Status: DC
  Administered 2017-05-09 – 2017-05-11 (×4): 60 mg via ORAL
  Filled 2017-05-09 (×5): qty 1

## 2017-05-09 MED ORDER — ONDANSETRON HCL 4 MG/2ML IJ SOLN
4.0000 mg | Freq: Once | INTRAMUSCULAR | Status: AC | PRN
  Administered 2017-05-09: 4 mg via INTRAVENOUS
  Filled 2017-05-09: qty 2

## 2017-05-09 MED ORDER — PERFLUTREN LIPID MICROSPHERE
1.0000 mL | INTRAVENOUS | Status: AC | PRN
  Administered 2017-05-09: 2 mL via INTRAVENOUS
  Filled 2017-05-09: qty 10

## 2017-05-09 MED ORDER — DEXTROSE 5 % IV SOLN
1.0000 g | INTRAVENOUS | Status: DC
  Administered 2017-05-09 – 2017-05-10 (×2): 1 g via INTRAVENOUS
  Filled 2017-05-09 (×4): qty 10

## 2017-05-09 MED ORDER — SODIUM CHLORIDE 0.9 % IV SOLN
INTRAVENOUS | Status: DC
  Administered 2017-05-09 – 2017-05-11 (×5): via INTRAVENOUS

## 2017-05-09 MED ORDER — BISACODYL 5 MG PO TBEC: 5.0000 mg | DELAYED_RELEASE_TABLET | Freq: Every day | ORAL | Status: DC | PRN

## 2017-05-09 MED ORDER — LACTATED RINGERS IV SOLN
INTRAVENOUS | Status: DC | PRN
  Administered 2017-05-09: 17:00:00 via INTRAVENOUS

## 2017-05-09 MED ORDER — ENOXAPARIN SODIUM 30 MG/0.3ML ~~LOC~~ SOLN
30.0000 mg | SUBCUTANEOUS | Status: DC
  Administered 2017-05-09: 30 mg via SUBCUTANEOUS
  Filled 2017-05-09: qty 0.3

## 2017-05-09 MED ORDER — ASPIRIN 325 MG PO TABS
325.0000 mg | ORAL_TABLET | Freq: Every day | ORAL | Status: DC
  Administered 2017-05-09: 325 mg via ORAL
  Filled 2017-05-09: qty 1

## 2017-05-09 MED ORDER — SODIUM CHLORIDE 0.9 % IR SOLN
Status: DC | PRN
  Administered 2017-05-09: 3000 mL via INTRAVESICAL

## 2017-05-09 MED ORDER — CEFAZOLIN SODIUM-DEXTROSE 2-4 GM/100ML-% IV SOLN: 2.0000 g | INTRAVENOUS | Status: DC

## 2017-05-09 MED ORDER — HYDROMORPHONE HCL 1 MG/ML IJ SOLN
1.0000 mg | INTRAMUSCULAR | Status: DC | PRN
  Administered 2017-05-09: 1 mg via INTRAVENOUS
  Filled 2017-05-09: qty 1

## 2017-05-09 MED ORDER — EPHEDRINE SULFATE-NACL 50-0.9 MG/10ML-% IV SOSY
PREFILLED_SYRINGE | INTRAVENOUS | Status: DC | PRN
  Administered 2017-05-09: 10 mg via INTRAVENOUS

## 2017-05-09 MED ORDER — DILTIAZEM HCL 30 MG PO TABS
30.0000 mg | ORAL_TABLET | Freq: Once | ORAL | Status: AC
  Administered 2017-05-09: 30 mg via ORAL
  Filled 2017-05-09: qty 1

## 2017-05-09 MED ORDER — TAMSULOSIN HCL 0.4 MG PO CAPS
0.4000 mg | ORAL_CAPSULE | Freq: Every day | ORAL | Status: DC
  Administered 2017-05-09 – 2017-05-10 (×2): 0.4 mg via ORAL
  Filled 2017-05-09 (×2): qty 1

## 2017-05-09 MED ORDER — LIDOCAINE 2% (20 MG/ML) 5 ML SYRINGE
INTRAMUSCULAR | Status: DC | PRN
  Administered 2017-05-09: 40 mg via INTRAVENOUS

## 2017-05-09 MED ORDER — IOHEXOL 300 MG/ML  SOLN
INTRAMUSCULAR | Status: DC | PRN
  Administered 2017-05-09: 4 mL via URETHRAL

## 2017-05-09 SURGICAL SUPPLY — 17 items
BAG URINE DRAINAGE (UROLOGICAL SUPPLIES) ×3 IMPLANT
BAG URO CATCHER STRL LF (MISCELLANEOUS) ×3 IMPLANT
BASKET ZERO TIP NITINOL 2.4FR (BASKET) IMPLANT
CATH INTERMIT  6FR 70CM (CATHETERS) ×6 IMPLANT
CATH TIEMANN FOLEY 18FR 5CC (CATHETERS) ×3 IMPLANT
CLOTH BEACON ORANGE TIMEOUT ST (SAFETY) ×3 IMPLANT
COVER SURGICAL LIGHT HANDLE (MISCELLANEOUS) ×3 IMPLANT
GLOVE BIOGEL M STRL SZ7.5 (GLOVE) ×3 IMPLANT
GOWN STRL REUS W/TWL LRG LVL3 (GOWN DISPOSABLE) ×3 IMPLANT
GOWN STRL REUS W/TWL XL LVL3 (GOWN DISPOSABLE) ×3 IMPLANT
GUIDEWIRE ANG ZIPWIRE 038X150 (WIRE) IMPLANT
GUIDEWIRE STR DUAL SENSOR (WIRE) ×3 IMPLANT
MANIFOLD NEPTUNE II (INSTRUMENTS) ×3 IMPLANT
PACK CYSTO (CUSTOM PROCEDURE TRAY) ×3 IMPLANT
STENT URET 6FRX26 CONTOUR (STENTS) ×3 IMPLANT
TUBING CONNECTING 10 (TUBING) ×2 IMPLANT
TUBING CONNECTING 10' (TUBING) ×1

## 2017-05-09 NOTE — Op Note (Signed)
Preoperative diagnosis: Left ureteral stone, left renal stones, acute renal failure Postoperative diagnosis: Left ureteral stone, left renal stone, acute renal failure, urethral stricture  Procedure: Cystoscopy, left retrograde pyelogram, left ureteral stent placement  Surgeon: Mena GoesEskridge  Anesthesia: Gen.  Indication for procedure: 83 year old elderly white male with persistent flank pain and a 6-7 mm left proximal ureteral stone with a smaller stone proximal to this. Patient added for urgent stent because of the acute renal failure but showing some signs of sirs with A. Fib (per CRNA), low-grade temperature and tachycardia.  Findings: On cystoscopy there was a moderate stricture of the bulbar urethra. This was dilated with the scope. There were no stones or foreign bodies in the bladder. The prostate did not appear obstructing. The trigone and ureteral orifices appeared normal. The bladder mucosa appeared normal.  Left retrograde pyelogram-this outlined a single ureter single collecting system unit with 2 filling defects in the left proximal ureter and dilation of the collecting system also possible filling defect of the left lower pole with the known stones.  Description of procedure: After consent was obtained the patient was brought to the operating room. After adequate anesthesia he was placed in lithotomy position and prepped and draped in the usual sterile fashion. I ordered cefazolin but ER he had Rocephin hanging 1 g which was given. I have cRNA call pharmacy and they recommended 2 g a we gave another gram. The cystoscope was passed per urethra where I noticed a moderate stricture of the bulb. I initially thought I could dilated with the scope but it began to form a false passage therefore I passed a wire through the true lumen and coiled this under fluoroscopic guidance in the bladder. It then became apparent I had dilated the stricture and there was just a thin film of tissue over the true  lumen which was dilated with the scope. Once in the bladder the wire was removed. I flushed the bladder a few times. I then cannulated the left ureteral orifice and injected contrast. I then passed a wire but they coiled at the stones and would not pass. I passed a 6 French catheter to brace the wire and the wire did pass the stones. I passed the 6 French catheter into the proximal ureter and remove the wire noted a good hydronephrotic drip. I sent urine for culture. I then reinjected contrast to confirm I was in the collecting system in this outlined a dilated collecting system. I then repassed the wire and removed the 6 French catheter and passed a 6 x 26 cm ureteral stent. There was a good coil seen in the renal pelvis and a good coil in the bladder. The scope was removed and an 8018 JamaicaFrench coud catheter was passed without difficulty. It was left to gravity drainage. He was awakened and taken to the recovery room in stable condition.   Complications: None Blood loss: 0  Specimens to lab: Urine for culture left kidney  Drains: 6 x 26 cm left ureteral stent

## 2017-05-09 NOTE — ED Notes (Signed)
Attempted to call report.  Nurse busy. 

## 2017-05-09 NOTE — Anesthesia Procedure Notes (Signed)
Procedure Name: LMA Insertion Date/Time: 05/09/2017 4:56 PM Performed by: Minerva EndsMIRARCHI, Valentine Kuechle M Pre-anesthesia Checklist: Patient identified, Emergency Drugs available, Suction available and Patient being monitored Patient Re-evaluated:Patient Re-evaluated prior to inductionOxygen Delivery Method: Circle System Utilized Preoxygenation: Pre-oxygenation with 100% oxygen Intubation Type: IV induction Ventilation: Mask ventilation without difficulty LMA: LMA inserted LMA Size: 4.0 Number of attempts: 1 Placement Confirmation: positive ETCO2 Tube secured with: Tape Dental Injury: Teeth and Oropharynx as per pre-operative assessment  Comments: Smooth IV induction--- LMA AM CRNA atraumatic--- mouth as preop--- bilat BS--- Carignan present--- assited and supervised induction

## 2017-05-09 NOTE — Anesthesia Postprocedure Evaluation (Signed)
Anesthesia Post Note  Patient: Charles Richard  Procedure(s) Performed: Procedure(s) (LRB): CYSTOSCOPY WITH RETROGRADE PYELOGRAM/URETERAL STENT PLACEMENT (Left)  Patient location during evaluation: PACU Anesthesia Type: General Level of consciousness: awake and alert Pain management: pain level controlled Vital Signs Assessment: post-procedure vital signs reviewed and stable Respiratory status: spontaneous breathing, nonlabored ventilation, respiratory function stable and patient connected to nasal cannula oxygen Cardiovascular status: blood pressure returned to baseline and stable Postop Assessment: no signs of nausea or vomiting Anesthetic complications: no       Last Vitals:  Vitals:   05/09/17 1800 05/09/17 1809  BP: 105/73 126/82  Pulse: 95 (!) 102  Resp: 15 17  Temp: 36.7 C 37.2 C    Last Pain:  Vitals:   05/09/17 1800  TempSrc:   PainSc: 1                  Phillips Groutarignan, Austine Kelsay

## 2017-05-09 NOTE — Transfer of Care (Signed)
Immediate Anesthesia Transfer of Care Note  Patient: Charles Richard  Procedure(s) Performed: Procedure(s): CYSTOSCOPY WITH RETROGRADE PYELOGRAM/URETERAL STENT PLACEMENT (Left)  Patient Location: PACU  Anesthesia Type:General  Level of Consciousness: awake and alert   Airway & Oxygen Therapy: Patient Spontanous Breathing and Patient connected to face mask oxygen  Post-op Assessment: Report given to RN and Post -op Vital signs reviewed and stable  Post vital signs: Reviewed and stable  Last Vitals:  Vitals:   05/09/17 1330 05/09/17 1530  BP: 136/74 124/70  Pulse: (!) 105 (!) 110  Resp: 19 19  Temp: 37.2 C (!) 38 C    Last Pain:  Vitals:   05/09/17 1530  TempSrc: Oral  PainSc:          Complications: No apparent anesthesia complications

## 2017-05-09 NOTE — ED Notes (Signed)
Pt not able to urinate.   

## 2017-05-09 NOTE — Progress Notes (Signed)
Pharmacy: ceftriaxone   Patient is an 83 y.o M who was diagnosed with kidney stone on 05/07/17 presented to the ED on 05/09/17 with c/o pain and nausea.  Per urology, plan is for cysto L ureteral stent placement today.  To start ceftriaxone for suspected UTI.   Plan: -  Ceftriaxone 1 gm IV q24h - No renal adjustment is needed with Ceftriaxone -- pharmacy will sign off - Re-consult us if need further assistance  Thank you for asking pharmacy to be part of this patient's care.  Dorna LeitzAnh Jeoffrey Eleazer, PharmD, BCPS 05/09/2017 1:02 PM

## 2017-05-09 NOTE — H&P (Addendum)
History and Physical    Charles Richard ZOX:[id number] DOB: 1930-08-06 DOA: 05/09/2017  Referring MD/NP/PA: Cathren Richard PCP: System, Provider Not In  Outpatient Specialists: Dr. Mena Richard  Patient coming from: home  Chief Complaint: abdominal pain  HPI: Charles Richard is a 83 y.o. male with history of diabetes mellitus type 2, hypertension, and kidney stones who presents with abdominal pain, nausea, and vomiting.  Seen in the ER on 5/28 and had a CT renal stone study which demonstrated mild left hydronephrosis secondary to 2 calculi in the middle portion portion of the left ureter, the largest of which was 7 mm.  They incidentally discovered a possible abdominal aneurysm vs. calcificed diverticulum and a possible enlarged LN anterior to pancreas.  He was discharged home from the ER with pain and nausea medication and advised to follow up with his Urologist and PCP.  He was given Rx for percocet and flomax, however, he had increasing left flank pain that was severe 10/10 at its worst, radiating down the left side to left lateral abdominal wall, and associated with nausea and vomiting.  Pain is no longer going away and was not improved by percocet and flomax so he returned to the ER.  Denies chest pains, SOB, diarrhea, constipation, dysuria, hematuria, obvious passing of stones, or change in urine volume.    ED Course:  Vital signs notable for pulse 111, blood pressure within normal limits, afebrile. Labs: WBC 14.8, sodium 133, creatinine 2.76 up from 2.21 on 5/28.  Baseline is unknown as previous creatinines were from 2010.  He was given Zofran, Phenergan, and dilaudid 0.5 and 1mg  in the emergency department which did not improve his pain but did help him fall asleep.  He was seen by Urology and they are planning for stent placement this afternoon.    Review of Systems:  General:  Subjective fevers and chills, denies weight loss or gain HEENT:  Denies changes to hearing and vision, rhinorrhea,  sinus congestion, sore throat CV:  Denies chest pain and palpitations, lower extremity edema.  PULM:  Denies SOB, wheezing, cough.   GI:  Per HIP GU:  Denies dysuria, frequency, urgency ENDO:  Denies polyuria, polydipsia.   HEME:  Denies hematemesis, blood in stools, melena, abnormal bruising or bleeding.  LYMPH:  Denies lymphadenopathy.   MSK:  Denies arthralgias, myalgias.   DERM:  Denies skin rash or ulcer.   NEURO:  Denies focal numbness, weakness, slurred speech, confusion, facial droop.  PSYCH:  Denies anxiety and depression.    Past Medical History:  Diagnosis Date  . Diabetes mellitus without complication (HCC)   . Hard of hearing   . Hyperlipidemia   . Hypertension   . Renal disorder    kidney stones    Past Surgical History:  Procedure Laterality Date  . CHOLECYSTECTOMY    . LITHOTRIPSY       reports that he has never smoked. He has never used smokeless tobacco. He reports that he does not drink alcohol or use drugs.  No Known Allergies  Family History  Problem Relation Age of Onset  . Lung cancer Father        black lung  . CAD Neg Hx   . Kidney Stones Neg Hx   . Aneurysm Neg Hx     Prior to Admission medications   Medication Sig Start Date End Date Taking? Authorizing Provider  amLODipine (NORVASC) 10 MG tablet Take 10 mg by mouth daily with breakfast.   Yes [provider]  aspirin 325 MG tablet Take 325 mg by mouth at bedtime.    Yes [provider]  carvedilol (COREG) 6.25 MG tablet Take 6.25 mg by mouth daily with breakfast.   Yes [provider]  cholecalciferol (VITAMIN D) 1000 units tablet Take 1,000 Units by mouth at bedtime.   Yes [provider]  lisinopril (PRINIVIL,ZESTRIL) 40 MG tablet Take 20 mg by mouth daily with breakfast.   Yes [provider]  loratadine (CLARITIN) 10 MG tablet Take 10 mg by mouth daily with breakfast.    Yes [provider]  meclizine (ANTIVERT) 25 MG tablet Take 25  mg by mouth 2 (two) times daily.    Yes [provider]  metFORMIN (GLUCOPHAGE) 1000 MG tablet Take 1,000 mg by mouth 2 (two) times daily with a meal.   Yes [provider]  Multiple Vitamin (MULTIVITAMIN WITH MINERALS) TABS tablet Take 1 tablet by mouth at bedtime.   Yes [provider]  ondansetron (ZOFRAN) 4 MG tablet Take 1 tablet (4 mg total) by mouth every 6 (six) hours. 05/07/17  Yes Charles Razor, MD  oxyCODONE-acetaminophen (PERCOCET/ROXICET) 5-325 MG tablet Take 1-2 tablets by mouth every 4 (four) hours as needed for severe pain. 05/07/17  Yes Charles Razor, MD  Propylene Glycol (SYSTANE BALANCE) 0.6 % SOLN Place 1-2 drops into both eyes daily as needed (for dry eyes).   Yes [provider]  simvastatin (ZOCOR) 20 MG tablet Take 10 mg by mouth daily with breakfast.   Yes [provider]  tamsulosin (FLOMAX) 0.4 MG CAPS capsule Take 1 capsule (0.4 mg total) by mouth daily. 05/07/17  Yes Charles Razor, MD  traMADol (ULTRAM) 50 MG tablet Take 100 mg by mouth every 6 (six) hours as needed for moderate pain.   Yes [provider]    Physical Exam: Vitals:   05/09/17 0637 05/09/17 1040  BP: 124/71 (!) 118/59  Pulse: (!) 111 (!) 101  Resp: 16 16  Temp: 97.4 F (36.3 C) 99.3 F (37.4 C)  TempSrc: Oral Oral  SpO2: 97% 94%   Constitutional: NAD, calm, comfortable, hard of hearing  Eyes: PERRL, lids and conjunctivae normal ENMT: Mucous membranes are moist. Posterior pharynx clear of any exudate or lesions.  Poor dentition.  Neck: normal, supple, no masses, no thyromegaly Respiratory: clear to auscultation bilaterally, no wheezing, no crackles. Normal respiratory effort. No accessory muscle use.  Cardiovascular: Regular rate and rhythm, no murmurs / rubs / gallops. No extremity edema. 2+ pedal pulses. No carotid bruits.  Abdomen: NABS, soft, nondistended, mild tenderness to palpation in the left upper quadrant without rebound or  guarding. Positive left flank pain  Musculoskeletal: no clubbing / cyanosis. No joint deformity upper and lower extremities. Good ROM, no contractures. Normal muscle tone.  Skin: no rashes, lesions, ulcers. No induration Neurologic: CN 2-12 grossly intact. Sensation intact, DTR normal. Strength 5/5 in all 4.  Psychiatric: Normal judgment and insight. Alert and oriented x 3. Normal mood.   Labs on Admission: I have personally reviewed following labs and imaging studies  CBC:  Recent Labs Lab 05/07/17 1218 05/09/17 0700  WBC 13.8* 14.8*  HGB 14.3 12.2*  HCT 41.5 35.8*  MCV 93.0 93.0  PLT 241 203   Basic Metabolic Panel:  Recent Labs Lab 05/07/17 1218 05/09/17 0700  NA 137 133*  K 4.5 4.5  CL 104 103  CO2 21* 21*  GLUCOSE 177* 152*  BUN 30* 40*  CREATININE 2.21* 2.76*  CALCIUM  9.5 8.3*   GFR: Estimated Creatinine Clearance: 24.5 mL/min (A) (by C-G formula based on SCr of 2.76 mg/dL (H)). Liver Function Tests:  Recent Labs Lab 05/07/17 1218  AST 26  ALT 14*  ALKPHOS 45  BILITOT 1.1  PROT 7.2  ALBUMIN 3.8    Recent Labs Lab 05/07/17 1218 05/09/17 0700  LIPASE 27 20   No results for input(s): AMMONIA in the last 168 hours. Coagulation Profile: No results for input(s): INR, PROTIME in the last 168 hours. Cardiac Enzymes: No results for input(s): CKTOTAL, CKMB, CKMBINDEX, TROPONINI in the last 168 hours. BNP (last 3 results) No results for input(s): PROBNP in the last 8760 hours. HbA1C: No results for input(s): HGBA1C in the last 72 hours. CBG: No results for input(s): GLUCAP in the last 168 hours. Lipid Profile: No results for input(s): CHOL, HDL, LDLCALC, TRIG, CHOLHDL, LDLDIRECT in the last 72 hours. Thyroid Function Tests: No results for input(s): TSH, T4TOTAL, FREET4, T3FREE, THYROIDAB in the last 72 hours. Anemia Panel: No results for input(s): VITAMINB12, FOLATE, FERRITIN, TIBC, IRON, RETICCTPCT in the last 72 hours. Urine analysis:      Component Value Date/Time   COLORURINE YELLOW 05/09/2017 0850   APPEARANCEUR CLEAR 05/09/2017 0850   LABSPEC 1.020 05/09/2017 0850   PHURINE 5.0 05/09/2017 0850   GLUCOSEU NEGATIVE 05/09/2017 0850   HGBUR NEGATIVE 05/09/2017 0850   BILIRUBINUR NEGATIVE 05/09/2017 0850   KETONESUR 5 (A) 05/09/2017 0850   PROTEINUR NEGATIVE 05/09/2017 0850   UROBILINOGEN 0.2 05/19/2009 0733   NITRITE NEGATIVE 05/09/2017 0850   LEUKOCYTESUR SMALL (A) 05/09/2017 0850   Sepsis Labs: @LABRCNTIP (procalcitonin:4,lacticidven:4) )No results found for this or any previous visit (from the past 240 hour(s)).   Radiological Exams on Admission: Ct Renal Stone Study  Result Date: 05/07/2017 CLINICAL DATA:  Acute right lower quadrant abdominal and flank pain. EXAM: CT ABDOMEN AND PELVIS WITHOUT CONTRAST TECHNIQUE: Multidetector CT imaging of the abdomen and pelvis was performed following the standard protocol without IV contrast. COMPARISON:  None. FINDINGS: Lower chest: No acute abnormality. Hepatobiliary: No focal liver abnormality is seen. No gallstones, gallbladder wall thickening, or biliary dilatation. Pancreas: Unremarkable. No pancreatic ductal dilatation or surrounding inflammatory changes. 16 x 10 mm probable lymph node is seen anterior and superior to pancreatic body. Spleen: Normal in size without focal abnormality. Adrenals/Urinary Tract: Adrenal glands are unremarkable. Two simple cysts arise from upper pole of right kidney, the largest measuring 5 cm. Left nephrolithiasis is noted. Mild left hydronephrosis is noted secondary to 2 calculi in middle portion of left ureter, the largest measuring 7 mm. Urinary bladder is unremarkable. Stomach/Bowel: Stomach is within normal limits. Appendix appears normal. No evidence of bowel wall thickening, distention, or inflammatory changes. Vascular/Lymphatic: 3.3 cm rounded density with calcific rim is noted anterior to the third portion of the duodenum. This is concerning  for possible aneurysm. Reproductive: Prostate is unremarkable. Other: Small fat containing periumbilical hernia is noted. No abdominopelvic ascites. Musculoskeletal: Severe multilevel degenerative disc disease is noted in the lumbar spine. No acute abnormality is noted. IMPRESSION: Left nephrolithiasis. Mild left hydronephrosis secondary to 2 calculi in the middle portion of the left ureter, the largest measuring 7 mm. Aortic atherosclerosis. Small fat containing periumbilical hernia. 16 x 10 mm lobulated density is seen superior and anterior to pancreatic body most consistent with lymph node. Follow-up CT scan in 3-6 months is recommended to ensure stability and rule out malignancy. 3.3 cm rounded density is seen anterior to third portion of the duodenum  with calcific rim which may represent calcified diverticulum, but large calcified aneurysm cannot be excluded. CT angiography of the abdomen is recommended for further evaluation. Electronically Signed   By: Lupita Raider, M.D.   On: 05/07/2017 15:18    EKG: Independently reviewed. ECG read as a-fib by monitor but may have some low amplitude p-waves.  No acute ischemia  Assessment/Plan Principal Problem:   Ureteral stone with hydronephrosis Active Problems:   Essential hypertension   Type 2 diabetes mellitus without complication (HCC)   Hyponatremia   AKI (acute kidney injury) (HCC)   Obstructive left renal ureteral calculi (2) with mild left hydronephrosis and sepsis secondary to suspected left pyelonephritis/UTI (fever, tachycardia, leukocytosis).   -  Appreciate Urology assistance -  NPO and IVF pending OR this afternoon -  Ultram, percocet, and dilaudid for pain control -  Add ceftriaxone  -  F/u urine culture   AKI, unknown baseline but creatinine increased from 2.21 on 5/28 to 2.76 on 5/30, likely in part due to obstructing left renal stones -  IVF -  D/c lisinopril -  Repeat creatinine in AM -  Urology to place stent later  today  New onset atrial fibrillation, CHADS2vasc 4, needs anticoagulation but held off prior to surgery -  Admit to telemetry -  TSH, troponins -  ECHO:  Preserved EF and no valvular abnormalities -  Please discuss possibility of anticoagulation with patient tomorrow -  Started on oral diltiazem -  Discontinue norvasc  Mild hyponatremia due to obstructing stones -  Repeat BMP in AM  Incidentally found density superior and anterior to the pancreatic body 16 x 10 mm, likely a lymph node -  Repeat CT scan in 3-6 months to ensure stability and rule out malignancy  3.3 cm density in the anterior to third portion of the duodenum with calcific rim which may represent a large calcified aneurysm -  Recommend follow-up CT angiography of the abdomen once patient is clinically stabilized  Diabetes mellitus type 2 without complication -  D/c metformin due to elevated creatinine -  Low dose SSI   Essential hypertension -  Hold lisinopril -  Continue carvedilol -  Changing norvasc to dilt for better rate control   DVT prophylaxis: lovenox/SCDs Code Status: full code Family Communication: no family at bedside  Disposition Plan:  Likely to home   Consults called: Urology  Admission status:  Observation due to obstructing kidney stones requiring stent placement with AKI  Renae Fickle MD [facility] Pager [phone]  If 7PM-7AM, please contact night-coverage [email or web address] Password [email or web address]  05/09/2017, 11:06 AM

## 2017-05-09 NOTE — Progress Notes (Addendum)
I called and discussed procedure with patient's daughter Marta LamasLori Poch. She works across the street at [employer]. Discussed with her stent and rationale for staged procedure. Also, discussed the urethral stricture. Discussed importance of URS/stent exchange or removal in a few weeks.    I also spoke with Ms. Merdis DelayK. Schorr about possible new onset AFIB and she'll check on pt later.

## 2017-05-09 NOTE — Anesthesia Preprocedure Evaluation (Addendum)
Anesthesia Evaluation  Patient identified by MRN, date of birth, ID band Patient awake    Reviewed: Allergy & Precautions, NPO status , Patient's Chart, lab work & pertinent test results  Airway Mallampati: II  TM Distance: >3 FB Neck ROM: Full    Dental no notable dental hx.    Pulmonary neg pulmonary ROS,    Pulmonary exam normal breath sounds clear to auscultation       Cardiovascular hypertension, Pt. on medications Normal cardiovascular exam Rhythm:Regular Rate:Normal  ? New onset afib. ECHO normal   Neuro/Psych negative neurological ROS  negative psych ROS   GI/Hepatic negative GI ROS, Neg liver ROS,   Endo/Other  diabetes, Type 2  Renal/GU Renal disease  negative genitourinary   Musculoskeletal negative musculoskeletal ROS (+)   Abdominal   Peds negative pediatric ROS (+)  Hematology negative hematology ROS (+)   Anesthesia Other Findings   Reproductive/Obstetrics negative OB ROS                            Anesthesia Physical Anesthesia Plan  ASA: II  Anesthesia Plan: General   Post-op Pain Management:    Induction: Intravenous  Airway Management Planned: LMA  Additional Equipment:   Intra-op Plan:   Post-operative Plan: Extubation in OR  Informed Consent: I have reviewed the patients History and Physical, chart, labs and discussed the procedure including the risks, benefits and alternatives for the proposed anesthesia with the patient or authorized representative who has indicated his/her understanding and acceptance.   Dental advisory given  Plan Discussed with: CRNA  Anesthesia Plan Comments:         Anesthesia Quick Evaluation

## 2017-05-09 NOTE — ED Triage Notes (Signed)
Pt c/o pain and nausea since last night. Was dx with kidney stone on Monday, they wanted to admit him but he left. Is now getting sick again. Prescriptions aren't helping enough.

## 2017-05-09 NOTE — Progress Notes (Signed)
CRITICAL VALUE ALERT  Critical Value:  Trop - 0.04  Date & Time Notied:  05/09/2017  @ 1400  Provider Notified: Short  Orders Received/Actions taken:

## 2017-05-09 NOTE — Progress Notes (Signed)
  Echocardiogram 2D Echocardiogram with Definity has been performed.  Nolon RodBrown, Tony 05/09/2017, 3:43 PM

## 2017-05-09 NOTE — Progress Notes (Signed)
Patients HR increased to 140's with ambulation to bathroom. HR decreased to 110's when returned to bed. Temp 100.4, MD notified. Patient given 500mL bolus and po cardizem prior to OR.  Earnest ConroyBrooke M. Clelia CroftShaw, RN

## 2017-05-09 NOTE — ED Provider Notes (Signed)
[facility]-EMERGENCY DEPT Provider Note   CSN: [id number] Arrival date & time: 05/09/17  0631     History   Chief Complaint Chief Complaint  Patient presents with  . Flank Pain    left  . Nephrolithiasis    HPI Doyne KeelRonald E Kren is a 83 y.o. male.  Patient with hx recently dx left ureteral stone, c/o persistent/worsening left flank pain. Pain constant, waxing and waning in intensity, mod-severe, radiating towards LLQ.  Same as prior, recent kidney stone pain. Nausea. Dry heaves. Poor po intake. No fever or chills. Is making normal amt urine. No dysuria. States pain not controlled w home meds.    The history is provided by the patient.  Flank Pain  Pertinent negatives include no chest pain, no abdominal pain, no headaches and no shortness of breath.    Past Medical History:  Diagnosis Date  . Diabetes mellitus without complication (HCC)   . Hypertension   . Renal disorder    kidney stones    There are no active problems to display for this patient.   Past Surgical History:  Procedure Laterality Date  . CHOLECYSTECTOMY         Home Medications    Prior to Admission medications   Medication Sig Start Date End Date Taking? Authorizing Provider  AmLODIPine Besylate (NORVASC PO) Take 1 tablet by mouth daily.    [provider]  aspirin 325 MG tablet Take 325 mg by mouth daily.    [provider]  CARVEDILOL PO Take 1 tablet by mouth daily.    [provider]  loratadine (CLARITIN) 10 MG tablet Take 10 mg by mouth daily.    [provider]  LOSARTAN POTASSIUM PO Take 1 tablet by mouth daily.    [provider]  meclizine (ANTIVERT) 25 MG tablet Take 25 mg by mouth daily as needed for dizziness.    [provider]  metFORMIN (GLUCOPHAGE) 1000 MG tablet Take 1,000 mg by mouth 2 (two) times daily with a meal.    [provider]  ondansetron (ZOFRAN) 4 MG tablet Take 1 tablet (4 mg total) by mouth every 6 (six)  hours. 05/07/17   Raeford RazorKohut, Stephen, MD  oxyCODONE-acetaminophen (PERCOCET/ROXICET) 5-325 MG tablet Take 1-2 tablets by mouth every 4 (four) hours as needed for severe pain. 05/07/17   Raeford RazorKohut, Stephen, MD  simvastatin (ZOCOR) 10 MG tablet Take 5 mg by mouth daily.    [provider]  tamsulosin (FLOMAX) 0.4 MG CAPS capsule Take 1 capsule (0.4 mg total) by mouth daily. 05/07/17   Raeford RazorKohut, Stephen, MD    Family History No family history on file.  Social History Social History  Substance Use Topics  . Smoking status: Never Smoker  . Smokeless tobacco: Never Used  . Alcohol use No     Allergies   Patient has no known allergies.   Review of Systems Review of Systems  Constitutional: Negative for fever.  HENT: Negative for sore throat.   Eyes: Negative for redness.  Respiratory: Negative for shortness of breath.   Cardiovascular: Negative for chest pain.  Gastrointestinal: Positive for nausea. Negative for abdominal pain.  Genitourinary: Positive for flank pain. Negative for testicular pain.  Musculoskeletal: Negative for back pain and neck pain.  Skin: Negative for rash.  Neurological: Negative for headaches.  Hematological: Does not bruise/bleed easily.  Psychiatric/Behavioral: Negative for confusion.     Physical Exam Updated Vital Signs BP 124/71   Pulse (!) 111   Temp 97.4 F (  36.3 C) (Oral)   Resp 16   SpO2 97%   Physical Exam  Constitutional: He appears well-developed and well-nourished. No distress.  HENT:  Mouth/Throat: Oropharynx is clear and moist.  Eyes: Conjunctivae are normal.  Neck: Neck supple. No tracheal deviation present.  Cardiovascular: Normal rate, regular rhythm, normal heart sounds and intact distal pulses.   Pulmonary/Chest: Effort normal and breath sounds normal. No accessory muscle usage. No respiratory distress.  Abdominal: Soft. Bowel sounds are normal. He exhibits no distension. There is no tenderness.  Genitourinary:  Genitourinary  Comments: No cva tenderness  Musculoskeletal: He exhibits no edema.  Neurological: He is alert.  Skin: Skin is warm and dry. No rash noted. He is not diaphoretic.  Psychiatric: He has a normal mood and affect.  Nursing note and vitals reviewed.    ED Treatments / Results  Labs (all labs ordered are listed, but only abnormal results are displayed)  Results for orders placed or performed during the hospital encounter of 05/09/17  Basic metabolic panel  Result Value Ref Range   Sodium 133 (L) 135 - 145 mmol/L   Potassium 4.5 3.5 - 5.1 mmol/L   Chloride 103 101 - 111 mmol/L   CO2 21 (L) 22 - 32 mmol/L   Glucose, Bld 152 (H) 65 - 99 mg/dL   BUN 40 (H) 6 - 20 mg/dL   Creatinine, Ser 9.60 (H) 0.61 - 1.24 mg/dL   Calcium 8.3 (L) 8.9 - 10.3 mg/dL   GFR calc non Af Amer 20 (L) >60 mL/min   GFR calc Af Amer 23 (L) >60 mL/min   Anion gap 9 5 - 15  CBC  Result Value Ref Range   WBC 14.8 (H) 4.0 - 10.5 K/uL   RBC 3.85 (L) 4.22 - 5.81 MIL/uL   Hemoglobin 12.2 (L) 13.0 - 17.0 g/dL   HCT 45.4 (L) 09.8 - 11.9 %   MCV 93.0 78.0 - 100.0 fL   MCH 31.7 26.0 - 34.0 pg   MCHC 34.1 30.0 - 36.0 g/dL   RDW 14.7 82.9 - 56.2 %   Platelets 203 150 - 400 K/uL  Lipase, blood  Result Value Ref Range   Lipase 20 11 - 51 U/L   Ct Renal Stone Study  Result Date: 05/07/2017 CLINICAL DATA:  Acute right lower quadrant abdominal and flank pain. EXAM: CT ABDOMEN AND PELVIS WITHOUT CONTRAST TECHNIQUE: Multidetector CT imaging of the abdomen and pelvis was performed following the standard protocol without IV contrast. COMPARISON:  None. FINDINGS: Lower chest: No acute abnormality. Hepatobiliary: No focal liver abnormality is seen. No gallstones, gallbladder wall thickening, or biliary dilatation. Pancreas: Unremarkable. No pancreatic ductal dilatation or surrounding inflammatory changes. 16 x 10 mm probable lymph node is seen anterior and superior to pancreatic body. Spleen: Normal in size without focal  abnormality. Adrenals/Urinary Tract: Adrenal glands are unremarkable. Two simple cysts arise from upper pole of right kidney, the largest measuring 5 cm. Left nephrolithiasis is noted. Mild left hydronephrosis is noted secondary to 2 calculi in middle portion of left ureter, the largest measuring 7 mm. Urinary bladder is unremarkable. Stomach/Bowel: Stomach is within normal limits. Appendix appears normal. No evidence of bowel wall thickening, distention, or inflammatory changes. Vascular/Lymphatic: 3.3 cm rounded density with calcific rim is noted anterior to the third portion of the duodenum. This is concerning for possible aneurysm. Reproductive: Prostate is unremarkable. Other: Small fat containing periumbilical hernia is noted. No abdominopelvic ascites. Musculoskeletal: Severe multilevel degenerative disc disease is noted in  the lumbar spine. No acute abnormality is noted. IMPRESSION: Left nephrolithiasis. Mild left hydronephrosis secondary to 2 calculi in the middle portion of the left ureter, the largest measuring 7 mm. Aortic atherosclerosis. Small fat containing periumbilical hernia. 16 x 10 mm lobulated density is seen superior and anterior to pancreatic body most consistent with lymph node. Follow-up CT scan in 3-6 months is recommended to ensure stability and rule out malignancy. 3.3 cm rounded density is seen anterior to third portion of the duodenum with calcific rim which may represent calcified diverticulum, but large calcified aneurysm cannot be excluded. CT angiography of the abdomen is recommended for further evaluation. Electronically Signed   By: Lupita Raider, M.D.   On: 05/07/2017 15:18    EKG  EKG Interpretation None       Radiology Ct Renal Stone Study  Result Date: 05/07/2017 CLINICAL DATA:  Acute right lower quadrant abdominal and flank pain. EXAM: CT ABDOMEN AND PELVIS WITHOUT CONTRAST TECHNIQUE: Multidetector CT imaging of the abdomen and pelvis was performed following  the standard protocol without IV contrast. COMPARISON:  None. FINDINGS: Lower chest: No acute abnormality. Hepatobiliary: No focal liver abnormality is seen. No gallstones, gallbladder wall thickening, or biliary dilatation. Pancreas: Unremarkable. No pancreatic ductal dilatation or surrounding inflammatory changes. 16 x 10 mm probable lymph node is seen anterior and superior to pancreatic body. Spleen: Normal in size without focal abnormality. Adrenals/Urinary Tract: Adrenal glands are unremarkable. Two simple cysts arise from upper pole of right kidney, the largest measuring 5 cm. Left nephrolithiasis is noted. Mild left hydronephrosis is noted secondary to 2 calculi in middle portion of left ureter, the largest measuring 7 mm. Urinary bladder is unremarkable. Stomach/Bowel: Stomach is within normal limits. Appendix appears normal. No evidence of bowel wall thickening, distention, or inflammatory changes. Vascular/Lymphatic: 3.3 cm rounded density with calcific rim is noted anterior to the third portion of the duodenum. This is concerning for possible aneurysm. Reproductive: Prostate is unremarkable. Other: Small fat containing periumbilical hernia is noted. No abdominopelvic ascites. Musculoskeletal: Severe multilevel degenerative disc disease is noted in the lumbar spine. No acute abnormality is noted. IMPRESSION: Left nephrolithiasis. Mild left hydronephrosis secondary to 2 calculi in the middle portion of the left ureter, the largest measuring 7 mm. Aortic atherosclerosis. Small fat containing periumbilical hernia. 16 x 10 mm lobulated density is seen superior and anterior to pancreatic body most consistent with lymph node. Follow-up CT scan in 3-6 months is recommended to ensure stability and rule out malignancy. 3.3 cm rounded density is seen anterior to third portion of the duodenum with calcific rim which may represent calcified diverticulum, but large calcified aneurysm cannot be excluded. CT angiography  of the abdomen is recommended for further evaluation. Electronically Signed   By: Lupita Raider, M.D.   On: 05/07/2017 15:18    Procedures Procedures (including critical care time)  Medications Ordered in ED Medications  ondansetron (ZOFRAN) injection 4 mg (4 mg Intravenous Given 05/09/17 0704)     Initial Impression / Assessment and Plan / ED Course  I have reviewed the triage vital signs and the nursing notes.  Pertinent labs & imaging results that were available during my care of the patient were reviewed by me and considered in my medical decision making (see chart for details).  Iv ns. Dilaudid .5 mg iv. zofran iv.   Reviewed nursing notes and prior charts for additional history.   Reviewed recent ct.  Pain improved w meds, but persists. Will discuss w  urology.   Patient with persistent/intractable n/v and pain.  Additional dilaudid iv. Phenergan 12.5 mg iv.  Discussed with Urology, Dr Mena Goes - he indicates given AKI, hx DM, admit to med service, he will consult and plan to do stent, keep npo.     Final Clinical Impressions(s) / ED Diagnoses   Final diagnoses:  None    New Prescriptions New Prescriptions   No medications on file     Cathren Laine, MD 05/09/17 [phone]

## 2017-05-09 NOTE — ED Notes (Signed)
Consent signs

## 2017-05-09 NOTE — Consult Note (Signed)
New Consult Note  Requesting Physician: Cathren Laine, MD  Service Requesting Consult: Emergency Medicine  Urology Consult Attending: Mena Goes Reason for Consult:  Left ureteral stone  Subjective: DOYL BITTING is seen in consultation for reasons noted above.   This is a 83 yo patient (severely hard of hearing) with a history of HTN, DM, nephrolithiasis presenting with left sided abdominal and flank pain for 2 days. Similar pain with prior kidney stone passage. Has had associated nausea/vomiting. No fevers. No dysuria. No gross hematuria. He was seen in the ED 5/28 and again today for persistent pain. Ongoing poor PO intake with minimal urine output. Pain not well controlled.   He has had prior kidney stones in the past and has required stone extraction. He is familiar with ureteral stents. He does not see a urologist regularly. 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   Present with daughter.   Past Medical History: Past Medical History:  Diagnosis Date  . Diabetes mellitus without complication (HCC)   . Hypertension   . Renal disorder    kidney stones    Past Surgical History:  Past Surgical History:  Procedure Laterality Date  . CHOLECYSTECTOMY      Medication: Current Facility-Administered Medications  Medication Dose Route Frequency Provider Last Rate Last Dose  . HYDROmorphone (DILAUDID) injection 1 mg  1 mg Intravenous Once Cathren Laine, MD      . promethazine (PHENERGAN) injection 12.5 mg  12.5 mg Intravenous Once Cathren Laine, MD       Current Outpatient Prescriptions  Medication Sig Dispense Refill  . ondansetron (ZOFRAN) 4 MG tablet Take 1 tablet (4 mg total) by mouth every 6 (six) hours. 12 tablet 0  . oxyCODONE-acetaminophen (PERCOCET/ROXICET) 5-325 MG tablet Take 1-2 tablets by mouth every 4 (four) hours as needed for severe pain. 15 tablet 0  . tamsulosin (FLOMAX) 0.4 MG CAPS capsule Take 1 capsule (0.4 mg total) by mouth  daily. 7 capsule 0  . AmLODIPine Besylate (NORVASC PO) Take 1 tablet by mouth daily.    Marland Kitchen aspirin 325 MG tablet Take 325 mg by mouth daily.    Marland Kitchen CARVEDILOL PO Take 1 tablet by mouth daily.    Marland Kitchen loratadine (CLARITIN) 10 MG tablet Take 10 mg by mouth daily.    Marland Kitchen LOSARTAN POTASSIUM PO Take 1 tablet by mouth daily.    . meclizine (ANTIVERT) 25 MG tablet Take 25 mg by mouth daily as needed for dizziness.    . metFORMIN (GLUCOPHAGE) 1000 MG tablet Take 1,000 mg by mouth 2 (two) times daily with a meal.    . simvastatin (ZOCOR) 10 MG tablet Take 5 mg by mouth daily.      Allergies: No Known Allergies  Social History: Social History  Substance Use Topics  . Smoking status: Never Smoker  . Smokeless tobacco: Never Used  . Alcohol use No    Family History No family history on file.  Review of Systems 10 systems were reviewed and are negative except as noted specifically in the HPI.  Objective: Vital signs in last 24 hours: BP 124/71   Pulse (!) 111   Temp 97.4 F (36.3 C) (Oral)   Resp 16   SpO2 97%   Intake/Output last 3 shifts: No intake/output data recorded.  Physical Exam General: NAD, A&O, resting, appropriate HEENT: [place]/AT, EOMI, MMM Pulmonary: Normal work of breathing on RA Cardiovascular: Regular rate Abdomen: soft,  nondistended, left abdominal  pain GU: No foley, left CVA tenderness Extremities: warm and well perfused, no edema Neuro: Appropriate, no focal neurological deficits  Most Recent Labs: Lab Results  Component Value Date   WBC 14.8 (H) 05/09/2017   HGB 12.2 (L) 05/09/2017   HCT 35.8 (L) 05/09/2017   PLT 203 05/09/2017    Lab Results  Component Value Date   NA 133 (L) 05/09/2017   K 4.5 05/09/2017   CL 103 05/09/2017   CO2 21 (L) 05/09/2017   BUN 40 (H) 05/09/2017   CREATININE 2.76 (H) 05/09/2017   CALCIUM 8.3 (L) 05/09/2017    Lab Results  Component Value Date   ALKPHOS 45 05/07/2017   BILITOT 1.1 05/07/2017   PROT 7.2 05/07/2017    ALBUMIN 3.8 05/07/2017   ALT 14 (L) 05/07/2017   AST 26 05/07/2017    No results found for: INR, APTT   IMAGING: Ct Renal Stone Study  Result Date: 05/07/2017 CLINICAL DATA:  Acute right lower quadrant abdominal and flank pain. EXAM: CT ABDOMEN AND PELVIS WITHOUT CONTRAST TECHNIQUE: Multidetector CT imaging of the abdomen and pelvis was performed following the standard protocol without IV contrast. COMPARISON:  None. FINDINGS: Lower chest: No acute abnormality. Hepatobiliary: No focal liver abnormality is seen. No gallstones, gallbladder wall thickening, or biliary dilatation. Pancreas: Unremarkable. No pancreatic ductal dilatation or surrounding inflammatory changes. 16 x 10 mm probable lymph node is seen anterior and superior to pancreatic body. Spleen: Normal in size without focal abnormality. Adrenals/Urinary Tract: Adrenal glands are unremarkable. Two simple cysts arise from upper pole of right kidney, the largest measuring 5 cm. Left nephrolithiasis is noted. Mild left hydronephrosis is noted secondary to 2 calculi in middle portion of left ureter, the largest measuring 7 mm. Urinary bladder is unremarkable. Stomach/Bowel: Stomach is within normal limits. Appendix appears normal. No evidence of bowel wall thickening, distention, or inflammatory changes. Vascular/Lymphatic: 3.3 cm rounded density with calcific rim is noted anterior to the third portion of the duodenum. This is concerning for possible aneurysm. Reproductive: Prostate is unremarkable. Other: Small fat containing periumbilical hernia is noted. No abdominopelvic ascites. Musculoskeletal: Severe multilevel degenerative disc disease is noted in the lumbar spine. No acute abnormality is noted. IMPRESSION: Left nephrolithiasis. Mild left hydronephrosis secondary to 2 calculi in the middle portion of the left ureter, the largest measuring 7 mm. Aortic atherosclerosis. Small fat containing periumbilical hernia. 16 x 10 mm lobulated density is  seen superior and anterior to pancreatic body most consistent with lymph node. Follow-up CT scan in 3-6 months is recommended to ensure stability and rule out malignancy. 3.3 cm rounded density is seen anterior to third portion of the duodenum with calcific rim which may represent calcified diverticulum, but large calcified aneurysm cannot be excluded. CT angiography of the abdomen is recommended for further evaluation. Electronically Signed   By: Lupita RaiderJames  Green Jr, M.D.   On: 05/07/2017 15:18    Assessment: Patient is a 83 y.o. male with HTN, DM, recurrent nephrolithiasis presenting as second visit to ED within 2 days with left flank and abdominal pain secondary to 7mm mid left ureteral stone.   Also has AKI with creatinine of 2.76.  WBC 14.8. Urinalysis pending. No fevers.   Discussed management plan. Given AKI on significant burden in left lower pole, will plan staged stent placement today and then ureteroscopic management in the future. Discussed stent placement, risks, benefits, complications. He is eager to proceed.   Recommendations: 1. Admission to medicine for management of comorbidities and  AKI.  2. OR later today for cysto left ureteral stent placement. Will planned staged ureteroscopic extraction to be scheduled as an outpatient at a date after discharge. 3. Keep NPO until OR with gentle IV fluids.  Discussed with Dr. Mena GoesEskridge. Thank you for this consult. Please do not hesitate to contact us with any further questions/concerns.  Buck MamJason Zarahi Fuerst, MD PGY4 Urology Resident

## 2017-05-10 ENCOUNTER — Encounter (HOSPITAL_COMMUNITY): Payer: Self-pay | Admitting: Urology

## 2017-05-10 DIAGNOSIS — E871 Hypo-osmolality and hyponatremia: Secondary | ICD-10-CM | POA: Diagnosis not present

## 2017-05-10 DIAGNOSIS — N390 Urinary tract infection, site not specified: Secondary | ICD-10-CM | POA: Diagnosis not present

## 2017-05-10 DIAGNOSIS — R5381 Other malaise: Secondary | ICD-10-CM | POA: Diagnosis not present

## 2017-05-10 DIAGNOSIS — I4891 Unspecified atrial fibrillation: Secondary | ICD-10-CM

## 2017-05-10 DIAGNOSIS — N179 Acute kidney failure, unspecified: Secondary | ICD-10-CM | POA: Diagnosis not present

## 2017-05-10 DIAGNOSIS — E119 Type 2 diabetes mellitus without complications: Secondary | ICD-10-CM

## 2017-05-10 DIAGNOSIS — A419 Sepsis, unspecified organism: Secondary | ICD-10-CM

## 2017-05-10 DIAGNOSIS — N132 Hydronephrosis with renal and ureteral calculous obstruction: Principal | ICD-10-CM

## 2017-05-10 DIAGNOSIS — I10 Essential (primary) hypertension: Secondary | ICD-10-CM | POA: Diagnosis not present

## 2017-05-10 LAB — BASIC METABOLIC PANEL
Anion gap: 9 (ref 5–15)
BUN: 32 mg/dL — AB (ref 6–20)
CALCIUM: 7.9 mg/dL — AB (ref 8.9–10.3)
CO2: 23 mmol/L (ref 22–32)
CREATININE: 2.37 mg/dL — AB (ref 0.61–1.24)
Chloride: 105 mmol/L (ref 101–111)
GFR calc non Af Amer: 24 mL/min — ABNORMAL LOW (ref >60)
GFR, EST AFRICAN AMERICAN: 27 mL/min — AB (ref >60)
Glucose, Bld: 107 mg/dL — ABNORMAL HIGH (ref 65–99)
Potassium: 3.8 mmol/L (ref 3.5–5.1)
Sodium: 137 mmol/L (ref 135–145)

## 2017-05-10 LAB — GLUCOSE, CAPILLARY
GLUCOSE-CAPILLARY: 103 mg/dL — AB (ref 65–99)
GLUCOSE-CAPILLARY: 93 mg/dL (ref 65–99)
GLUCOSE-CAPILLARY: 132 mg/dL — AB (ref 65–99)

## 2017-05-10 LAB — CBC
HCT: 34.2 % — ABNORMAL LOW (ref 39.0–52.0)
Hemoglobin: 11.4 g/dL — ABNORMAL LOW (ref 13.0–17.0)
MCH: 31.1 pg (ref 26.0–34.0)
MCHC: 33.3 g/dL (ref 30.0–36.0)
MCV: 93.4 fL (ref 78.0–100.0)
PLATELETS: 202 10*3/uL (ref 150–400)
RBC: 3.66 MIL/uL — AB (ref 4.22–5.81)
RDW: 12.3 % (ref 11.5–15.5)
WBC: 11.3 10*3/uL — ABNORMAL HIGH (ref 4.0–10.5)

## 2017-05-10 LAB — TROPONIN I: Troponin I: 0.04 ng/mL (ref <0.03)

## 2017-05-10 LAB — URINE CULTURE

## 2017-05-10 LAB — MAGNESIUM: MAGNESIUM: 1.4 mg/dL — AB (ref 1.7–2.4)

## 2017-05-10 MED ORDER — APIXABAN 2.5 MG PO TABS
2.5000 mg | ORAL_TABLET | Freq: Two times a day (BID) | ORAL | Status: DC
  Administered 2017-05-10 – 2017-05-11 (×2): 2.5 mg via ORAL
  Filled 2017-05-10 (×2): qty 1

## 2017-05-10 NOTE — Evaluation (Signed)
Physical Therapy Evaluation Patient Details Name: Charles Richard MRN: [id number] DOB: [birth year] Today's Date: 05/10/2017   History of Present Illness   83 y.o. male with history of diabetes mellitus type 2, hypertension, and kidney stones who presents with abdominal pain, nausea, and vomiting.  Seen in the ER on 5/28 and had a CT renal stone study which demonstrated mild left hydronephrosis secondary to 2 calculi in the middle portion portion of the left ureter. s/p L ureteral stent 05/09/17.  Clinical Impression  Pt admitted with above diagnosis. Pt currently with functional limitations due to the deficits listed below (see PT Problem List). Pt ambulated 200' with RW and min/guard assist for balance with turns.  Pt will benefit from skilled PT to increase their independence and safety with mobility to allow discharge to the venue listed below.       Follow Up Recommendations Home health PT; supervision for mobility    Equipment Recommendations  Rolling walker with 5" wheels (pt's daughter to check and see if they already have a RW)    Recommendations for Other Services       Precautions / Restrictions Precautions Precautions: Fall Precaution Comments: pt denies h/o falls; HOH Restrictions Weight Bearing Restrictions: No      Mobility  Bed Mobility               General bed mobility comments: up in recliner  Transfers Overall transfer level: Needs assistance Equipment used: Rolling walker (2 wheeled) Transfers: Sit to/from Stand Sit to Stand: Supervision         General transfer comment: VCs hand placement, uses momentum to stand  Ambulation/Gait Ambulation/Gait assistance: Min guard Ambulation Distance (Feet): 200 Feet Assistive device: Rolling walker (2 wheeled) Gait Pattern/deviations: Step-through pattern;Trunk flexed   Gait velocity interpretation: at or above normal speed for age/gender General Gait Details: steady with RW, VCs for positioning in RW  with turns, pt reports he feels weaker than normal 2* to "being in bed for several days"  Stairs            Wheelchair Mobility    Modified Rankin (Stroke Patients Only)       Balance Overall balance assessment: Needs assistance   Sitting balance-Leahy Scale: Good     Standing balance support: Single extremity supported Standing balance-Leahy Scale: Fair                               Pertinent Vitals/Pain Pain Assessment: 0-10    Home Living Family/patient expects to be discharged to:: Private residence Living Arrangements: Children (daughter and her fiance) Available Help at Discharge: Family;Available PRN/intermittently   Home Access: Stairs to enter   Entrance Stairs-Number of Steps: 1 Home Layout: One level Home Equipment: Cane - single point      Prior Function Level of Independence: Independent with assistive device(s)         Comments: walks with SPC, drives     Hand Dominance        Extremity/Trunk Assessment   Upper Extremity Assessment Upper Extremity Assessment: Overall WFL for tasks assessed    Lower Extremity Assessment Lower Extremity Assessment: Overall WFL for tasks assessed (B knee ext +4/5)    Cervical / Trunk Assessment Cervical / Trunk Assessment: Normal  Communication   Communication: HOH  Cognition Arousal/Alertness: Awake/alert Behavior During Therapy: WFL for tasks assessed/performed Overall Cognitive Status: Within Functional Limits for tasks assessed  General Comments      Exercises     Assessment/Plan    PT Assessment Patient needs continued PT services  PT Problem List Decreased activity tolerance;Decreased balance;Decreased mobility;Decreased knowledge of use of DME       PT Treatment Interventions Gait training;DME instruction;Functional mobility training;Therapeutic exercise;Therapeutic activities;Patient/family education    PT  Goals (Current goals can be found in the Care Plan section)  Acute Rehab PT Goals Patient Stated Goal: get home to his dog Rosa PT Goal Formulation: With patient Time For Goal Achievement: 05/24/17 Potential to Achieve Goals: Good    Frequency Min 3X/week   Barriers to discharge        Co-evaluation               AM-PAC PT "6 Clicks" Daily Activity  Outcome Measure Difficulty turning over in bed (including adjusting bedclothes, sheets and blankets)?: A Little Difficulty moving from lying on back to sitting on the side of the bed? : A Little Difficulty sitting down on and standing up from a chair with arms (e.g., wheelchair, bedside commode, etc,.)?: A Little Help needed moving to and from a bed to chair (including a wheelchair)?: A Little Help needed walking in hospital room?: A Little Help needed climbing 3-5 steps with a railing? : A Little 6 Click Score: 18    End of Session Equipment Utilized During Treatment: Gait belt Activity Tolerance: Patient tolerated treatment well;No increased pain Patient left: in chair;with call bell/phone within reach Nurse Communication: Mobility status PT Visit Diagnosis: Unsteadiness on feet (R26.81);Difficulty in walking, not elsewhere classified (R26.2)    Time: 1478-2956 PT Time Calculation (min) (ACUTE ONLY): 21 min   Charges:   PT Evaluation $PT Eval Low Complexity: 1 Procedure     PT G Codes:   PT G-Codes **NOT FOR INPATIENT CLASS** Functional Assessment Tool Used: AM-PAC 6 Clicks Basic Mobility Functional Limitation: Mobility: Walking and moving around Mobility: Walking and Moving Around Current Status (O1308): At least 20 percent but less than 40 percent impaired, limited or restricted Mobility: Walking and Moving Around Goal Status [phone]): At least 1 percent but less than 20 percent impaired, limited or restricted      Tamala Ser 05/10/2017, 12:58 PM [phone]

## 2017-05-10 NOTE — Care Management Note (Signed)
Case Management Note  Patient Details  Name: Doyne KeelRonald E Elster MRN: [id number] Date of Birth: 04/17/1930  Subjective/Objective:PT recc [facility]-rep Kenard Gowerrew aware of HHC order, await d/c.                    Action/Plan:d/c home w/HHC.   Expected Discharge Date:   (unknown)               Expected Discharge Plan:  Home w Home Health Services  In-House Referral:     Discharge planning Services  CM Consult  Post Acute Care Choice:  Durable Medical Equipment (rw) Choice offered to:  Patient  DME Arranged:    DME Agency:     HH Arranged:  PT HH Agency:  [facility]  Status of Service:  In process, will continue to follow  If discussed at Long Length of Stay Meetings, dates discussed:    Additional Comments:  Lanier ClamMahabir, Diedre Maclellan, RN 05/10/2017, 1:43 PM

## 2017-05-10 NOTE — Care Management Note (Signed)
Case Management Note  Patient Details  Name: Charles Richard MRN: [id number] Date of Birth: 1930-06-01  Subjective/Objective:83 y/o m admitted w/Ureteral stone w/hydronephrosis. From home. Urology following. PT cons-await recc.                    Action/Plan:d/c home.   Expected Discharge Date:   (unknown)               Expected Discharge Plan:  Home/Self Care  In-House Referral:     Discharge planning Services  CM Consult  Post Acute Care Choice:    Choice offered to:     DME Arranged:    DME Agency:     HH Arranged:    HH Agency:     Status of Service:  In process, will continue to follow  If discussed at Long Length of Stay Meetings, dates discussed:    Additional Comments:  Lanier ClamMahabir, Liya Strollo, RN 05/10/2017, 11:22 AM

## 2017-05-10 NOTE — Progress Notes (Signed)
[facility] PROGRESS NOTE  Charles Richard ZOX:[id number] DOB: July 04, 1930 DOA: 05/09/2017 PCP: [facility]  Interim summary and history of present illness:  83 y.o. male with history of diabetes mellitus type 2, hypertension, and kidney stones who presents with abdominal pain, nausea, and vomiting.  Seen in the ER on 5/28 and had a CT renal stone study which demonstrated mild left hydronephrosis secondary to 2 calculi in the middle portion portion of the left ureter, the largest of which was 7 mm.  They incidentally discovered a possible abdominal aneurysm vs. calcificed diverticulum and a possible enlarged LN anterior to pancreas.  He was discharged home from the ER with pain and nausea medication and advised to follow up with his Urologist and PCP.  He was given Rx for percocet and flomax, however, he had increasing left flank pain that was severe 10/10 at its worst, radiating down the left side to left lateral abdominal wall, and associated with nausea and vomiting.  Patient found to have a heart rate up to 111, elevated wbc's at 14.8, worsening renal function and low-grade temperature. At this moment patient admitted secondary to urosepsis due to obstructive uropathy and hydronephrosis.   Assessment/Plan: 1-obstructive urosepsis: With left renal or ureteral calculi causing mild hydronephrosis -Patient with sepsis on admission presented with fever, tachycardia, leukocytosis. -Sepsis features improving/resolving -Will continue Rocephin IV for another 24 hours -Patient is a status post ureteral stent by urology -Renal function also improving. -Will follow culture results.  2-acute renal failure: Unknown if acute on chronic.  -Patient creatinine on admission 2.76 -Dehydration, obstructive uropathy, infection and continue use of nephrotoxic agents playing a role. -Continue holding nephrotoxic agents -Urology has placed stent, which would alleviate hydronephrosis -Continue  antibiotics -Follow renal function trend  3-atrial fibrillation: new diagnosis -CHADsVASC score 4 -rate improved with use of cardizem and coreg -will need outpatient follow up with cardiology -will start eliquis  4-hypertension -Continue carvedilol -Norvasc has been substituted by Cardizem (in order to provide better control of his heart rate) -Continue holding lisinopril due to renal failure.  5-diabetes mellitus type 2: No major complication reported by patient, most likely with some nephropathy. 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 Will continue adjusting medications for rate control of new onset A. Fib. Patient will need outpatient follow-up with cardiology and anticoagulation.   Consultants:  Urology  Procedures:  2-D echo - Left ventricle: The cavity size was normal. There was mild focal   basal hypertrophy of the septum. Systolic function was normal.   The estimated ejection fraction was in the range of 60% to 65%.   Wall motion was normal; there were no regional wall motion   abnormalities. - Aortic root: The aortic root was mildly dilated.  Impressions:  - Technically difficult; definity used; normal LV systolic   function; mildly dilated aortic root.  Antibiotics:  Rocephin 5/30  HPI/Subjective: Afebrile,  no chest pain,  no shortness of breath and no palpitations. Patient is still with some intermittent episode of left flank pain and is still experiencing hematuria (even much improved).  Objective: Vitals:   05/10/17 0708 05/10/17 1546  BP: 116/68 102/70  Pulse: 98 (!) 105  Resp: 17 19  Temp: 99.1 F (37.3 C) 97.9 F (36.6 C)    Intake/Output Summary (Last 24 hours) at 05/10/17 1859 Last data filed at 05/10/17 1547  Gross per 24 hour  Intake          2636.66 ml  Output             1950 ml  Net           686.66 ml   Filed Weights   05/09/17 1330  Weight: 105.3 kg (232 lb 2.3 oz)    Exam:   General:  Afebrile, reports no chest pain and no shortness of breath. Patient endorses no palpitations. Still having some hematuria and some discomfort on his left flank.   Cardiovascular: Tachycardic, irregular, no rubs, no gallops, no murmurs,  no JVD   Respiratory: No using accessory muscles, no wheezing, no crackles   Abdomen: Positive left CVA, no distention, no guarding, positive bowel sounds   Musculoskeletal: Trace edema bilaterally, no cyanosis or clubbing.   Data Reviewed: Basic Metabolic Panel:  Recent Labs Lab 05/07/17 1218 05/09/17 0700 05/10/17 0124 05/10/17 0753  NA 137 133* 137  --   K 4.5 4.5 3.8  --   CL 104 103 105  --   CO2 21* 21* 23  --   GLUCOSE 177* 152* 107*  --   BUN 30* 40* 32*  --   CREATININE 2.21* 2.76* 2.37*  --   CALCIUM 9.5 8.3* 7.9*  --   MG  --   --   --  1.4*   Liver Function Tests:  Recent Labs Lab 05/07/17 1218  AST 26  ALT 14*  ALKPHOS 45  BILITOT 1.1  PROT 7.2  ALBUMIN 3.8    Recent Labs Lab 05/07/17 1218 05/09/17 0700  LIPASE 27 20   CBC:  Recent Labs Lab 05/07/17 1218 05/09/17 0700 05/10/17 0124  WBC 13.8* 14.8* 11.3*  HGB 14.3 12.2* 11.4*  HCT 41.5 35.8* 34.2*  MCV 93.0 93.0 93.4  PLT 241 203 202   Cardiac Enzymes:  Recent Labs Lab 05/09/17 1312 05/09/17 1947 05/10/17 0124 05/10/17 0753   TROPONINI 0.04* <0.03 <0.03 0.04*    CBG:  Recent Labs Lab 05/09/17 1834 05/09/17 2047 05/10/17 0731 05/10/17 1202 05/10/17 1639  GLUCAP 146* 128* 103* 93 132*    Recent Results (from the past 240 hour(s))  Urine culture     Status: Abnormal   Collection Time: 05/09/17  8:50 AM  Result Value Ref Range Status   Specimen Description URINE, CLEAN CATCH  Final   Special Requests NONE  Final   Culture (A)  Final    <10,000 COLONIES/mL INSIGNIFICANT GROWTH Performed at [facility], [address], [place], [place] [address]    Report Status 05/10/2017 FINAL  Final  Culture, blood (x 2)     Status: None (Preliminary result)   Collection Time: 05/09/17  6:40 PM  Result Value Ref Range Status   Specimen Description RIGHT ANTECUBITAL  Final   Special Requests   Final    BOTTLES DRAWN AEROBIC AND ANAEROBIC Blood Culture adequate volume   Culture   Final    NO GROWTH < 24 HOURS Performed at [facility], [address] [place], [place] [address]    Report Status PENDING  Incomplete  Culture, blood (x 2)     Status: None (Preliminary result)   Collection Time: 05/09/17  6:48 PM  Result Value Ref Range Status   Specimen Description LEFT ANTECUBITAL  Final   Special Requests   Final    BOTTLES DRAWN AEROBIC AND ANAEROBIC Blood Culture adequate volume   Culture   Final    NO GROWTH < 24 HOURS Performed at [facility], [address] [place], [place] [address]    Report Status PENDING  Incomplete     Studies: Dg Chest Port 1 View  Result Date: 05/09/2017 CLINICAL DATA:  Shortness of breath. Dyspnea and atrial fibrillation with RVR. EXAM: PORTABLE CHEST 1 VIEW COMPARISON:  05/18/2009 FINDINGS: Slightly low lung volumes. No focal airspace disease or pulmonary edema. Heart size is within normal limits for technique. Atherosclerotic calcifications at the aortic arch. The trachea is midline. Negative for a pneumothorax. Bone structures intact. IMPRESSION: No  active disease. Electronically Signed   By: Richarda OverlieAdam  Henn M.D.   On: 05/09/2017 19:07   Dg C-arm 1-60 Min-no Report  Result Date: 05/09/2017 Fluoroscopy was utilized by the requesting physician.  No radiographic interpretation.    Scheduled Meds: . aspirin  325 mg Oral QHS  . carvedilol  6.25 mg Oral Q breakfast  . diltiazem  60 mg Oral Q12H  . enoxaparin (LOVENOX) injection  30 mg Subcutaneous Q24H  . insulin aspart  0-9 Units Subcutaneous TID WC  . loratadine  10 mg Oral Q breakfast  . meclizine  25 mg Oral BID  . multivitamin with minerals  1 tablet Oral QHS  . polyethylene glycol  17 g Oral Daily  . simvastatin  10 mg Oral Q breakfast  . tamsulosin  0.4 mg Oral QHS   Continuous Infusions: . sodium chloride 100 mL/hr at 05/10/17 1308  . cefTRIAXone (ROCEPHIN)  IV Stopped (05/10/17 1334)    Principal Problem:   Ureteral stone with hydronephrosis Active Problems:   Essential hypertension   Type 2 diabetes mellitus without complication (HCC)   Hyponatremia   AKI (acute kidney injury) (HCC)   Atrial fibrillation with RVR (HCC)   Sepsis secondary to UTI [facility](HCC)    Time spent: 25 minutes    Vassie LollMadera, Bera Pinela  [facility] Pager [phone]. If 7PM-7AM, please contact night-coverage at [email or web address], password [email or web address] 05/10/2017, 6:59 PM  LOS: 0 days

## 2017-05-10 NOTE — Progress Notes (Addendum)
Looks much better. Eating breakfast.   Vitals:   05/10/17 0116 05/10/17 0708  BP: 125/67 116/68  Pulse: 97 98  Resp: 16 17  Temp: 97.9 F (36.6 C) 99.1 F (37.3 C)   BMET    Component Value Date/Time   NA 137 05/10/2017 0124   K 3.8 05/10/2017 0124   CL 105 05/10/2017 0124   CO2 23 05/10/2017 0124   GLUCOSE 107 (H) 05/10/2017 0124   BUN 32 (H) 05/10/2017 0124   CREATININE 2.37 (H) 05/10/2017 0124   CALCIUM 7.9 (L) 05/10/2017 0124   GFRNONAA 24 (L) 05/10/2017 0124   GFRAA 27 (L) 05/10/2017 0124    NAD Alert and O with no focal deficits Sitting up in bed eating Urine clear  A/P - 1) Left proximal ureteral stones and left renal stones in the setting of acute renal failure nausea and vomiting- s/p left ureteral stent 5/30 - patient much improved today.  I'll set him up for ureteroscopy in 2-3 weeks.  I'll sign off but please page GU on-call for any issues.  2) large caliber urethral sx - discussed with patient. Will d/c foley today.

## 2017-05-10 NOTE — Progress Notes (Signed)
CRITICAL VALUE ALERT  Critical Value: Trop 0.04  Date & Time Notied:  05/10/17 @ 0900  Provider Notified: Dr. Gwenlyn PerkingMadera @0915   Orders Received/Actions taken: Continue to monitor

## 2017-05-10 NOTE — Care Management Obs Status (Signed)
MEDICARE OBSERVATION STATUS NOTIFICATION   Patient Details  Name: Charles Richard MRN: [id number] Date of Birth: Jan 06, 1930   Medicare Observation Status Notification Given:  Yes    MahabirOlegario [name], [name] [name], RN 05/10/2017, 1:45 PM

## 2017-05-11 DIAGNOSIS — R5381 Other malaise: Secondary | ICD-10-CM

## 2017-05-11 DIAGNOSIS — I4891 Unspecified atrial fibrillation: Secondary | ICD-10-CM | POA: Diagnosis not present

## 2017-05-11 DIAGNOSIS — N132 Hydronephrosis with renal and ureteral calculous obstruction: Secondary | ICD-10-CM | POA: Diagnosis not present

## 2017-05-11 DIAGNOSIS — I10 Essential (primary) hypertension: Secondary | ICD-10-CM | POA: Diagnosis not present

## 2017-05-11 DIAGNOSIS — N179 Acute kidney failure, unspecified: Secondary | ICD-10-CM | POA: Diagnosis not present

## 2017-05-11 DIAGNOSIS — E871 Hypo-osmolality and hyponatremia: Secondary | ICD-10-CM

## 2017-05-11 LAB — CBC
HCT: 32 % — ABNORMAL LOW (ref 39.0–52.0)
Hemoglobin: 10.9 g/dL — ABNORMAL LOW (ref 13.0–17.0)
MCH: 32 pg (ref 26.0–34.0)
MCHC: 34.1 g/dL (ref 30.0–36.0)
MCV: 93.8 fL (ref 78.0–100.0)
PLATELETS: 207 10*3/uL (ref 150–400)
RBC: 3.41 MIL/uL — AB (ref 4.22–5.81)
RDW: 12.6 % (ref 11.5–15.5)
WBC: 10.2 10*3/uL (ref 4.0–10.5)

## 2017-05-11 LAB — BASIC METABOLIC PANEL
ANION GAP: 9 (ref 5–15)
BUN: 26 mg/dL — AB (ref 6–20)
CO2: 22 mmol/L (ref 22–32)
Calcium: 8 mg/dL — ABNORMAL LOW (ref 8.9–10.3)
Chloride: 108 mmol/L (ref 101–111)
Creatinine, Ser: 1.71 mg/dL — ABNORMAL HIGH (ref 0.61–1.24)
GFR calc Af Amer: 41 mL/min — ABNORMAL LOW (ref >60)
GFR, EST NON AFRICAN AMERICAN: 35 mL/min — AB (ref >60)
Glucose, Bld: 119 mg/dL — ABNORMAL HIGH (ref 65–99)
POTASSIUM: 4.2 mmol/L (ref 3.5–5.1)
SODIUM: 139 mmol/L (ref 135–145)

## 2017-05-11 LAB — URINE CULTURE: Culture: NO GROWTH

## 2017-05-11 LAB — GLUCOSE, CAPILLARY
GLUCOSE-CAPILLARY: 113 mg/dL — AB (ref 65–99)
GLUCOSE-CAPILLARY: 159 mg/dL — AB (ref 65–99)
Glucose-Capillary: 112 mg/dL — ABNORMAL HIGH (ref 65–99)
Glucose-Capillary: 124 mg/dL — ABNORMAL HIGH (ref 65–99)

## 2017-05-11 MED ORDER — APIXABAN 2.5 MG PO TABS: 2.5000 mg | ORAL_TABLET | Freq: Two times a day (BID) | ORAL | 1 refills | Status: DC

## 2017-05-11 MED ORDER — CIPROFLOXACIN HCL 500 MG PO TABS
500.0000 mg | ORAL_TABLET | Freq: Two times a day (BID) | ORAL | Status: DC
  Administered 2017-05-11: 500 mg via ORAL
  Filled 2017-05-11: qty 1

## 2017-05-11 MED ORDER — DILTIAZEM HCL ER 60 MG PO CP12: 60.0000 mg | ORAL_CAPSULE | Freq: Two times a day (BID) | ORAL | 1 refills | Status: DC

## 2017-05-11 MED ORDER — LISINOPRIL 40 MG PO TABS: 20.0000 mg | ORAL_TABLET | Freq: Every day | ORAL | Status: DC

## 2017-05-11 MED ORDER — CIPROFLOXACIN HCL 500 MG PO TABS: 500.0000 mg | ORAL_TABLET | Freq: Two times a day (BID) | ORAL | 0 refills | Status: AC

## 2017-05-11 NOTE — Discharge Instructions (Signed)
Ureteral Stent Implantation, Care After Refer to this sheet in the next few weeks. These instructions provide you with information about caring for yourself after your procedure. Your health care provider may also give you more specific instructions. Your treatment has been planned according to current medical practices, but problems sometimes occur. Call your health care provider if you have any problems or questions after your procedure. What can I expect after the procedure? After the procedure, it is common to have:  Nausea.  Mild pain when you urinate. You may feel this pain in your lower back or lower abdomen. Pain should stop within a few minutes after you urinate. This may last for up to 1 week.  A small amount of blood in your urine for several days. Follow these instructions at home:   Medicines   Take over-the-counter and prescription medicines only as told by your health care provider.  If you were prescribed an antibiotic medicine, take it as told by your health care provider. Do not stop taking the antibiotic even if you start to feel better.  Do not drive for 24 hours if you received a sedative.  Do not drive or operate heavy machinery while taking prescription pain medicines. Activity   Return to your normal activities as told by your health care provider. Ask your health care provider what activities are safe for you.  Do not lift anything that is heavier than 10 lb (4.5 kg). Follow this limit for 1 week after your procedure, or for as long as told by your health care provider. General instructions   Watch for any blood in your urine. Call your health care provider if the amount of blood in your urine increases.  If you have a catheter:  Follow instructions from your health care provider about taking care of your catheter and collection bag.  Do not take baths, swim, or use a hot tub until your health care provider approves.  Drink enough fluid to keep your urine  clear or pale yellow.  Keep all follow-up visits as told by your health care provider. This is important. Contact a health care provider if:  You have pain that gets worse or does not get better with medicine, especially pain when you urinate.  You have difficulty urinating.  You feel nauseous or you vomit repeatedly during a period of more than 2 days after the procedure. Get help right away if:  Your urine is dark red or has blood clots in it.  You are leaking urine (have incontinence).  The end of the stent comes out of your urethra.  You cannot urinate.  You have sudden, sharp, or severe pain in your abdomen or lower back.  You have a fever. This information is not intended to replace advice given to you by your health care provider. Make sure you discuss any questions you have with your health care provider. Document Released: 07/30/2013 Document Revised: 05/04/2016 Document Reviewed: 06/11/2015 Elsevier Interactive Patient Education  2017 [employer]  ______________________________________________________________  Information on my medicine - ELIQUIS (apixaban)  This medication education was reviewed with me or my healthcare representative as part of my discharge preparation.    Why was Eliquis prescribed for you? Eliquis was prescribed for you to reduce the risk of a blood clot forming that can cause a stroke if you have a medical condition called atrial fibrillation (a type of irregular heartbeat).  What do You need to know about Eliquis ? Take your Eliquis TWICE DAILY -  one tablet in the morning and one tablet in the evening with or without food. If you have difficulty swallowing the tablet whole please discuss with your pharmacist how to take the medication safely.  Take Eliquis exactly as prescribed by your doctor and DO NOT stop taking Eliquis without talking to the doctor who prescribed the medication.  Stopping may increase your risk of developing a  stroke.  Refill your prescription before you run out.  After discharge, you should have regular check-up appointments with your healthcare provider that is prescribing your Eliquis.  In the future your dose may need to be changed if your kidney function or weight changes by a significant amount or as you get older.  What do you do if you miss a dose? If you miss a dose, take it as soon as you remember on the same day and resume taking twice daily.  Do not take more than one dose of ELIQUIS at the same time to make up a missed dose.  Important Safety Information A possible side effect of Eliquis is bleeding. You should call your healthcare provider right away if you experience any of the following: ? Bleeding from an injury or your nose that does not stop. ? Unusual colored urine (red or dark brown) or unusual colored stools (red or black). ? Unusual bruising for unknown reasons. ? A serious fall or if you hit your head (even if there is no bleeding).  Some medicines may interact with Eliquis and might increase your risk of bleeding or clotting while on Eliquis. To help avoid this, consult your healthcare provider or pharmacist prior to using any new prescription or non-prescription medications, including herbals, vitamins, non-steroidal anti-inflammatory drugs (NSAIDs) and supplements.  This website has more information on Eliquis (apixaban): http://www.eliquis.com/eliquis/home

## 2017-05-11 NOTE — Discharge Summary (Signed)
Physician Discharge Summary  Doyne KeelRonald E Stradling ZOX:[id number] DOB: [birth year] DOA: 05/09/2017  [facility]  Admit date: 05/09/2017 Discharge date: 05/11/2017  Time spent: 35 minutes  Recommendations for Outpatient Follow-up:  1. Repeat BMET to follow electrolytes and renal function  2. Reassess BP and resume antihypertensive regimen (lisinopril, if renal function stable/back to normal at follow up) 3. Repeat CBC to follow Hgb trend  4. Patient needs follow up with cardiology for further evaluation and management of new A. Fib 5. Make sure patient has follow as instructed with urology service  6. Recommending follow-up with a CT angiography of the abdomen once the patient is clinically stable and his renal function has improved/resolved 7. Recommend repeat CT scan in 3-6 month to ensure stability and rule out malignancy   Discharge Diagnoses:  Principal Problem:   Ureteral stone with hydronephrosis Active Problems:   Essential hypertension   Type 2 diabetes mellitus without complication (HCC)   Hyponatremia   AKI (acute kidney injury) (HCC)   Atrial fibrillation with RVR (HCC)   Sepsis secondary to UTI [facility](HCC)   Physical deconditioning   Discharge Condition: stable and improved. Discharge home with home health services. Outpatient follow up with Dr. Mena GoesEskridge (urology) and with cardiology as an outpatient. Also asked to arrange follow up up with PCP in 10 days.  Diet recommendation: heart healthy diet and modified carbohydrates   Filed Weights   05/09/17 1330  Weight: 105.3 kg (232 lb 2.3 oz)    History of present illness:  83 y.o.malewith history of diabetes mellitus type 2, hypertension, and kidney stones who presents with abdominal pain, nausea, and vomiting. Seen in the ER on 5/28 and had a CT renal stone study which demonstrated mild left hydronephrosis secondary to 2 calculi in the middle portion portion of the left ureter, the largest of which was 7 mm.  They incidentally discovered a possible abdominal aneurysm vs. calcificed diverticulum and a possible enlarged LN anterior to pancreas.He was discharged home from the ER with pain and nausea medication and advised to follow up with his Urologist and PCP. He was given Rx for percocet and flomax, however, he had increasing left flank pain that was severe 10/10 at its worst, radiating down the left side to left lateral abdominal wall, and associated with nausea and vomiting.  Patient found to have a heart rate up to 111, elevated wbc's at 14.8, worsening renal function and low-grade temperature. At this moment patient admitted secondary to urosepsis due to obstructive uropathy and hydronephrosis.   Hospital Course:  1-obstructive urosepsis: With left renal or ureteral calculi causing mild hydronephrosis -Patient with sepsis on admission presented with fever, tachycardia, leukocytosis. -patient discharge on ciprofloxacin to be taken for another 5 days at discharge to complete antibiotic therapy.  -Will continue Rocephin IV for another 24 hours -Patient is a status post ureteral stent by urology and will follow up with them in the next 1-2 weeks  -Renal function improving and sepsis features resolved. -urine cultures w/o specific growth   2-acute renal failure: Unknown if acute on chronic.  -Patient creatinine on admission 2.76 -Dehydration, obstructive uropathy, infection and continue use of nephrotoxic agents playing a role. -Continue holding nephrotoxic agents at discharge -advise to keep himself well hydrated  -Urology has placed stent, which would alleviate hydronephrosis -Continue antibiotics as mentioned above to complete tx for infection -Follow renal function trend at follow up.  3-atrial fibrillation: new diagnosis -CHADsVASC score 4 -rate improved/controlled with use of cardizem and  coreg -will need outpatient follow up with cardiology (office will set up visit) -discharged on  eliquis -patient's 2-D echo reassuring  -normal TSH  4-hypertension -Continue carvedilol -Norvasc has been substituted by Cardizem (in order to provide control of his heart rate) -Continue holding lisinopril due to renal failure; reassess need of restarting this medication once renal function back to normal.  5-diabetes mellitus type 2: No major complication reported by patient, most likely with some nephropathy at baseline. 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 There was mild focal basal hypertrophy of the septum. Systolic function was normal. The estimated ejection fraction was in the range of 60% to 65%. Wall motion was normal; there were no regional wall motion abnormalities. - Aortic root: The aortic root was mildly dilated.  Impressions: - Technically difficult; definity used; normal LV systolic function; mildly dilated aortic root.  Consultations:  Urology  Cardiology   Discharge Exam: Vitals:   05/11/17 0617 05/11/17 1429  BP: 125/64 125/66  Pulse: 87 92  Resp: 17 (!) 24  Temp: 98.6 F (37 C) 98.3 F (36.8 C)    General:  Afebrile, reports no chest pain and no shortness of breath. Patient endorses no palpitations or feeling heart racing/skipping beats. Still having some left flank discomfort, but much improved. Urine continue clearing and is not having  frank hematuria anymore.   Cardiovascular: irregular, but with control rate. No no rubs, no gallops, no murmurs,  no JVD   Respiratory: No using accessory muscles, no wheezing, no crackles   Abdomen: mild left CVA, no distention, no guarding, positive bowel sounds   Musculoskeletal: Trace edema bilaterally, no cyanosis or clubbing.    Discharge Instructions   Discharge Instructions    Diet - low sodium heart healthy    Complete by:  As directed    Discharge instructions    Complete by:  As directed    Keep yourself well hydrated Take medications as prescribed  Please follow heart healthy diet Follow up with urology as instructed Follow up with cardiology (for further workup and treatment of Atrial fibrillation; office will set up appointment for you) Stop lisinopril until follow up with PCP     Current Discharge Medication List    START taking these medications   Details  apixaban (ELIQUIS) 2.5 MG TABS tablet Take 1 tablet (2.5 mg total) by mouth 2 (two) times daily. Qty: 60 tablet, Refills: 1    ciprofloxacin (CIPRO) 500 MG tablet Take 1 tablet (500 mg total) by mouth 2 (two) times daily. Qty: 10 tablet, Refills: 0    diltiazem (CARDIZEM SR) 60 MG 12 hr capsule Take 1 capsule (60 mg total) by mouth every 12 (twelve) hours. Qty: 60 capsule, Refills: 1      CONTINUE these medications which have CHANGED   Details  lisinopril (PRINIVIL,ZESTRIL) 40 MG tablet Take 0.5 tablets (20 mg total) by mouth daily with breakfast. HOLD UNTIL FOLLOW UP WITH PCP  CONTINUE these medications which have NOT CHANGED   Details  carvedilol (COREG) 6.25 MG tablet Take 6.25 mg by mouth daily with breakfast.    cholecalciferol (VITAMIN D) 1000 units tablet Take 1,000 Units by mouth at bedtime.    loratadine (CLARITIN) 10 MG tablet Take 10 mg by mouth daily with breakfast.     meclizine (ANTIVERT) 25 MG tablet Take 25 mg by mouth 2 (two) times daily.     metFORMIN (GLUCOPHAGE) 1000 MG  tablet Take 1,000 mg by mouth 2 (two) times daily with a meal.    Multiple Vitamin (MULTIVITAMIN WITH MINERALS) TABS tablet Take 1 tablet by mouth at bedtime.    ondansetron (ZOFRAN) 4 MG tablet Take 1 tablet (4 mg total) by mouth every 6 (six) hours. Qty: 12 tablet, Refills: 0    Propylene Glycol (SYSTANE BALANCE) 0.6 % SOLN Place 1-2 drops into both eyes daily as needed (for dry eyes).    simvastatin (ZOCOR) 20 MG tablet Take 10 mg by mouth daily with breakfast.    tamsulosin (FLOMAX) 0.4 MG CAPS capsule Take 1 capsule (0.4 mg total) by mouth daily. Qty: 7 capsule, Refills: 0    traMADol (ULTRAM) 50 MG tablet Take 100 mg by mouth every 6 (six) hours as needed for moderate pain.      STOP taking these medications     amLODipine (NORVASC) 10 MG tablet      aspirin 325 MG tablet      oxyCODONE-acetaminophen (PERCOCET/ROXICET) 5-325 MG tablet        No Known Allergies Follow-up Information    Jerilee Field, MD Follow up.   Specialty:  Urology Why:  1-2 weeks  Contact information: [address] [place] [address] [phone]        [facility] Follow up.   Specialty:  Home Health Services Why:  [facility] physical therapy Contact information: [address] [place] [address] [phone]        [facility] Schedule an appointment as soon as possible for a visit in 10 day(s).   Contact information: [address] [facility] [place] [address] [phone]            The results of significant diagnostics from this hospitalization (including imaging, microbiology, ancillary and laboratory) are listed below for reference.    Significant Diagnostic Studies: Dg Chest Port 1 View  Result Date: 05/09/2017 CLINICAL DATA:  Shortness of breath. Dyspnea and atrial fibrillation with RVR. EXAM: PORTABLE CHEST 1 VIEW COMPARISON:  05/18/2009 FINDINGS: Slightly low lung volumes. No focal airspace disease or  pulmonary edema. Heart size is within normal limits for technique. Atherosclerotic calcifications at the aortic arch. The trachea is midline. Negative for a pneumothorax. Bone structures intact. IMPRESSION: No active disease. Electronically Signed   By: Richarda Overlie M.D.   On: 05/09/2017 19:07   Dg C-arm 1-60 Min-no Report  Result Date: 05/09/2017 Fluoroscopy was utilized by the requesting physician.  No radiographic interpretation.   Ct Renal Stone Study  Result Date: 05/07/2017 CLINICAL DATA:  Acute right lower quadrant abdominal and flank pain. EXAM: CT ABDOMEN AND PELVIS WITHOUT CONTRAST TECHNIQUE: Multidetector CT imaging of the abdomen and pelvis was performed following the standard protocol without IV contrast. COMPARISON:  None. FINDINGS: Lower chest: No acute abnormality. Hepatobiliary: No focal liver abnormality is seen. No gallstones, gallbladder wall thickening, or biliary dilatation. Pancreas: Unremarkable. No pancreatic ductal dilatation or surrounding inflammatory changes. 16 x 10 mm probable lymph node  is seen anterior and superior to pancreatic body. Spleen: Normal in size without focal abnormality. Adrenals/Urinary Tract: Adrenal glands are unremarkable. Two simple cysts arise from upper pole of right kidney, the largest measuring 5 cm. Left nephrolithiasis is noted. Mild left hydronephrosis is noted secondary to 2 calculi in middle portion of left ureter, the largest measuring 7 mm. Urinary bladder is unremarkable. Stomach/Bowel: Stomach is within normal limits. Appendix appears normal. No evidence of bowel wall thickening, distention, or inflammatory changes. Vascular/Lymphatic: 3.3 cm rounded density with calcific rim is noted anterior to the third portion of the duodenum. This is concerning for possible aneurysm. Reproductive: Prostate is unremarkable. Other: Small fat containing periumbilical hernia is noted. No abdominopelvic ascites. Musculoskeletal: Severe multilevel degenerative disc  disease is noted in the lumbar spine. No acute abnormality is noted. IMPRESSION: Left nephrolithiasis. Mild left hydronephrosis secondary to 2 calculi in the middle portion of the left ureter, the largest measuring 7 mm. Aortic atherosclerosis. Small fat containing periumbilical hernia. 16 x 10 mm lobulated density is seen superior and anterior to pancreatic body most consistent with lymph node. Follow-up CT scan in 3-6 months is recommended to ensure stability and rule out malignancy. 3.3 cm rounded density is seen anterior to third portion of the duodenum with calcific rim which may represent calcified diverticulum, but large calcified aneurysm cannot be excluded. CT angiography of the abdomen is recommended for further evaluation. Electronically Signed   By: Lupita Raider, M.D.   On: 05/07/2017 15:18    Microbiology: Recent Results (from the past 240 hour(s))  Urine culture     Status: Abnormal   Collection Time: 05/09/17  8:50 AM  Result Value Ref Range Status   Specimen Description URINE, CLEAN CATCH  Final   Special Requests NONE  Final   Culture (A)  Final    <10,000 COLONIES/mL INSIGNIFICANT GROWTH Performed at [facility], [address], [place], [place] [address]    Report Status 05/10/2017 FINAL  Final  Urine culture     Status: None   Collection Time: 05/09/17  5:12 PM  Result Value Ref Range Status   Specimen Description CYSTOSCOPY  Final   Special Requests NONE  Final   Culture   Final    NO GROWTH Performed at [facility], [address], [place], [place] [address]    Report Status 05/11/2017 FINAL  Final  Culture, blood (x 2)     Status: None (Preliminary result)   Collection Time: 05/09/17  6:40 PM  Result Value Ref Range Status   Specimen Description RIGHT ANTECUBITAL  Final   Special Requests   Final    BOTTLES DRAWN AEROBIC AND ANAEROBIC Blood Culture adequate volume   Culture   Final    NO GROWTH 2 DAYS Performed at [facility], [address] [place], [place] [address]    Report Status PENDING  Incomplete  Culture, blood (x 2)     Status: None (Preliminary result)   Collection Time: 05/09/17  6:48 PM  Result Value Ref Range Status   Specimen Description LEFT ANTECUBITAL  Final   Special Requests   Final    BOTTLES DRAWN AEROBIC AND ANAEROBIC Blood Culture adequate volume   Culture   Final    NO GROWTH 2 DAYS Performed at [facility], [address], [place], [place] [address]    Report Status PENDING  Incomplete     Labs: Basic Metabolic Panel:  Recent Labs  Lab 05/07/17 1218 05/09/17 0700 05/10/17 0124 05/10/17 0753 05/11/17 0502  NA 137 133* 137  --  139  K 4.5 4.5 3.8  --  4.2  CL 104 103 105  --  108  CO2 21* 21* 23  --  22  GLUCOSE 177* 152* 107*  --  119*  BUN 30* 40* 32*  --  26*  CREATININE 2.21* 2.76* 2.37*  --  1.71*  CALCIUM 9.5 8.3* 7.9*  --  8.0*  MG  --   --   --  1.4*  --    Liver Function Tests:  Recent Labs Lab 05/07/17 1218  AST 26  ALT 14*  ALKPHOS 45  BILITOT 1.1  PROT 7.2  ALBUMIN 3.8    Recent Labs Lab 05/07/17 1218 05/09/17 0700  LIPASE 27 20   No results for input(s): AMMONIA in the last 168 hours. CBC:  Recent Labs Lab 05/07/17 1218 05/09/17 0700 05/10/17 0124 05/11/17 0502  WBC 13.8* 14.8* 11.3* 10.2  HGB 14.3 12.2* 11.4* 10.9*  HCT 41.5 35.8* 34.2* 32.0*  MCV 93.0 93.0 93.4 93.8  PLT 241 203 202 207   Cardiac Enzymes:  Recent Labs Lab 05/09/17 1312 05/09/17 1947 05/10/17 0124 05/10/17 0753  TROPONINI 0.04* <0.03 <0.03 0.04*   BNP: BNP (last 3 results) No results for input(s): BNP in the last 8760 hours.  ProBNP (last 3 results) No results for input(s): PROBNP in the last 8760 hours.  CBG:  Recent Labs Lab 05/10/17 1202 05/10/17 1639 05/10/17 2255 05/11/17 0740 05/11/17 1140  GLUCAP 93 132* 113* 112* 159*       Signed:  Vassie Loll MD.  [facility] 05/11/2017, 3:53 PM

## 2017-05-11 NOTE — Progress Notes (Signed)
Physical Therapy Treatment Patient Details Name: Charles KeelRonald E Boursiquot MRN: [id number] DOB: 09/23/1930 Today's Date: 05/11/2017    History of Present Illness  83 y.o. male with history of diabetes mellitus type 2, hypertension, and kidney stones who presents with abdominal pain, nausea, and vomiting.  Seen in the ER on 5/28 and had a CT renal stone study which demonstrated mild left hydronephrosis secondary to 2 calculi in the middle portion portion of the left ureter. s/p L ureteral stent 05/09/17.    PT Comments    The patient  Is planning Dc today. Recomend close supervision for ambulation.  Follow Up Recommendations  Home health PT;Supervision for mobility/OOB     Equipment Recommendations   (pt states he has a RW.)    Recommendations for Other Services       Precautions / Restrictions Precautions Precautions: Fall    Mobility  Bed Mobility Overal bed mobility: Needs Assistance Bed Mobility: Supine to Sit;Sit to Supine     Supine to sit: Supervision     General bed mobility comments: patient rocks to get momentum and thrusts legs to get momentum  Transfers Overall transfer level: Needs assistance Equipment used: Rolling walker (2 wheeled) Transfers: Sit to/from Stand Sit to Stand: Supervision         General transfer comment: VCs hand placement, uses momentum to stand  Ambulation/Gait Ambulation/Gait assistance: Min guard Ambulation Distance (Feet): 200 Feet Assistive device: Rolling walker (2 wheeled) Gait Pattern/deviations: Step-through pattern     General Gait Details: steady with RW,    Stairs            Wheelchair Mobility    Modified Rankin (Stroke Patients Only)       Balance                                            Cognition Arousal/Alertness: Awake/alert                                            Exercises      General Comments        Pertinent Vitals/Pain Pain Assessment: No/denies  pain    Home Living                      Prior Function            PT Goals (current goals can now be found in the care plan section) Progress towards PT goals: Progressing toward goals    Frequency    Min 3X/week      PT Plan Current plan remains appropriate    Co-evaluation              AM-PAC PT "6 Clicks" Daily Activity  Outcome Measure  Difficulty turning over in bed (including adjusting bedclothes, sheets and blankets)?: A Little Difficulty moving from lying on back to sitting on the side of the bed? : A Little Difficulty sitting down on and standing up from a chair with arms (e.g., wheelchair, bedside commode, etc,.)?: A Little Help needed moving to and from a bed to chair (including a wheelchair)?: A Little Help needed walking in hospital room?: A Little Help needed climbing 3-5 steps with a railing? : A Little 6 Click Score: 18  End of Session   Activity Tolerance: Patient tolerated treatment well Patient left: in bed;with call bell/phone within reach;with bed alarm set Nurse Communication: Mobility status PT Visit Diagnosis: Unsteadiness on feet (R26.81);Difficulty in walking, not elsewhere classified (R26.2)     Time: 1610-9604 PT Time Calculation (min) (ACUTE ONLY): 18 min  Charges:  $Gait Training: 8-22 mins                    G Codes:       Rada Hay 05/11/2017, 4:54 PM

## 2017-05-14 LAB — CULTURE, BLOOD (ROUTINE X 2)
CULTURE: NO GROWTH
CULTURE: NO GROWTH
SPECIAL REQUESTS: ADEQUATE
Special Requests: ADEQUATE

## 2017-05-30 NOTE — Progress Notes (Signed)
Consulted Dr. Eilene GhaziGeorge Rose, Anesthesia about patient's EKG from 05/09/2017 with new onset of Atrial Fibrillation and per Dr. Okey Dupreose, patient needs's Cardiac Clearance for surgery on 06/08/2017.

## 2017-05-31 ENCOUNTER — Other Ambulatory Visit: Payer: Self-pay | Admitting: Urology

## 2017-06-01 ENCOUNTER — Encounter: Payer: Self-pay | Admitting: Cardiology

## 2017-06-01 ENCOUNTER — Ambulatory Visit (INDEPENDENT_AMBULATORY_CARE_PROVIDER_SITE_OTHER): Payer: Medicare Other | Admitting: Cardiology

## 2017-06-01 VITALS — BP 106/68 | HR 133 | Ht 72.0 in | Wt 214.0 lb

## 2017-06-01 DIAGNOSIS — Z01810 Encounter for preprocedural cardiovascular examination: Secondary | ICD-10-CM

## 2017-06-01 DIAGNOSIS — N132 Hydronephrosis with renal and ureteral calculous obstruction: Secondary | ICD-10-CM | POA: Diagnosis not present

## 2017-06-01 DIAGNOSIS — I10 Essential (primary) hypertension: Secondary | ICD-10-CM | POA: Diagnosis not present

## 2017-06-01 DIAGNOSIS — E871 Hypo-osmolality and hyponatremia: Secondary | ICD-10-CM | POA: Diagnosis not present

## 2017-06-01 DIAGNOSIS — N179 Acute kidney failure, unspecified: Secondary | ICD-10-CM

## 2017-06-01 DIAGNOSIS — E119 Type 2 diabetes mellitus without complications: Secondary | ICD-10-CM | POA: Diagnosis not present

## 2017-06-01 DIAGNOSIS — I714 Abdominal aortic aneurysm, without rupture, unspecified: Secondary | ICD-10-CM

## 2017-06-01 DIAGNOSIS — I4891 Unspecified atrial fibrillation: Secondary | ICD-10-CM | POA: Diagnosis not present

## 2017-06-01 MED ORDER — AMIODARONE HCL 200 MG PO TABS: ORAL_TABLET | ORAL | 6 refills | Status: DC

## 2017-06-01 NOTE — Patient Instructions (Addendum)
Medication start tonight  Amiodarone Take 400 mg ( 2 tablets ) by mouth twice a day for 5 days then take 400 mg ( 2 tablets) by mouth once daily for 5 days then 200 mg daily    Stop diltiazem now    starting on Wednesday June 06 2017 Take eliqius morning dose then stop until after Surgery. Restart on Saturday June 09 2017    Your physician recommends that you schedule a follow-up appointment in Wednesday June 06 2080 for ekg with nurse's visit. Will be able to clear you for surgery at that time.   Your physician recommends that you schedule a follow-up appointment in July 09 2017 with Dr Herbie BaltimoreHarding

## 2017-06-01 NOTE — Progress Notes (Signed)
PCP: [facility]  Clinic Note: Chief Complaint  Patient presents with  . New Patient (Initial Visit)    needs cardiac clearance, possible a fib    HPI: Charles Richard is a 83 y.o. male who is being seen today for the evaluation of newly recognized Afib as part of pre-op evalaution at the request of [facility].  HANNIBAL SKALLA admitted to [facility] from 5/30-6/1 2018 with nephrolithiasis related UTI & Sepsis.  Initially - ER 05/07/2017 - Abd pain, N/V & dysuria --CT showed kidney stone & mild L Hydronephrosis (2/2 mid L utreteral calculi -up to 7 mm).  Also noted ? AAA & calcified diverticulum along w/ peri-adrenal LN enlargement. --> d/c for OP Urology f/u with pain meds,   but with increased L flank pain& severe N/V, he returned to ER on 5/30.  UTI with Sepsis (UroSepsis)  Tachycardic - 111 with leukocytosis. Cr 2.76 -- Rx with Rocephin & converted to PO Cipro  Urology - L ureteral stent with plan for 1-2 week f/u (& has planned Cystoscopy now for 6/29  Renal Fxn improved with resolution of hydronephrosis & UTI  Tachycardia was noted to be Afib RVR (? New Dx of Afib) --> Dilt & Coreg, Eliquis; Cardiology was not directly consulted.  Studies Personally Reviewed - (if available, images/films reviewed: From Epic Chart or Care Everywhere)  Echo 05/09/2017: EF 60-65% with no regional wall motion abnormalities. Technically difficult.  CT Renal : L nephrolithiasis with mild hydronephrosis 2/2 2 calculi in mid L ureter (largest ~7 cm) Aortic Atherosclerosis - ? Calcified 3.3 cm mass anterior to duodenum (? Aneurysm). Multilevel severe L-spine DJD  Interval History: Charles Richard presents today for initial follow-up with new diagnosis of A. fib. He walked in from the bathroom with no major complaints. He had no concept of the fact that he was in atrial fibrillation with a rate of 133 beats minute upon arrival to the room. He may note a little bit of dyspnea with  exertion, but he has noticed that there has been much of a change for him. He has no idea when he went into A. fib, and there was no comment on the discharge summary was still in A. fib upon discharge.  He denies any PND, orthopnea or edema. He denies any chest tightness pressure with rest or exertion. Some mild positional dizziness, but no significant lightheadedness, dizziness, wooziness or syncope/near-syncope. No TIA or amaurosis fugax. No bleeding issues onset ELIQUIS: No melena, hematochezia hematuria or epistaxis. No significant bruising. . No claudication.  ROS: A comprehensive was performed. Review of Systems  Constitutional: Negative for chills, fever and malaise/fatigue (E getting better post d/c).  HENT: Negative for congestion and nosebleeds.   Respiratory: Negative for cough and shortness of breath.   Cardiovascular:       Per HPI  Gastrointestinal: Negative for abdominal pain, blood in stool, diarrhea, melena and nausea.  Genitourinary: Positive for frequency. Negative for dysuria and flank pain.  Musculoskeletal: Positive for back pain and joint pain.  Neurological: Positive for dizziness (positional). Negative for focal weakness and loss of consciousness.  Endo/Heme/Allergies: Negative for environmental allergies. Does not bruise/bleed easily.  Psychiatric/Behavioral: Positive for memory loss. Negative for depression. The patient is not nervous/anxious and does not have insomnia.   All other systems reviewed and are negative.  I have reviewed and (if needed) personally updated the patient's problem list, medications, allergies, past medical and surgical history, social and family history.  Past Medical History:  Diagnosis Date  . AAA (abdominal aortic aneurysm) without rupture (HCC) 06/03/2017  . Atrial fibrillation with rapid ventricular response (HCC) 04/2017   New diagnosis during admission for urosepsis secondary to left nephrolithiasis/ureteral stone with  hydronephrosis  . Diabetes mellitus without complication (HCC)   . Essential hypertension   . Hard of hearing   . History of CVA (cerebrovascular accident) without residual deficits 05/2009   Admitted with expressive aphasia & confusion following a neurocardiogenic syncope event (related to dehydration, AKD & BB related hypotension/bradycardia)  . Hyperlipidemia   . Left nephrolithiasis 04/2017   s/p L Ureteral stent for L hydronephrosis & UTI/Urosepsis.      Past Surgical History:  Procedure Laterality Date  . CHOLECYSTECTOMY    . CYSTOSCOPY W/ URETERAL STENT PLACEMENT Left 05/09/2017   Procedure: CYSTOSCOPY WITH RETROGRADE PYELOGRAM/URETERAL STENT PLACEMENT;  Surgeon: Jerilee Field, MD;  Location: [facility];  Service: Urology;  Laterality: Left;  . LITHOTRIPSY      Current Meds  Medication Sig  . apixaban (ELIQUIS) 2.5 MG TABS tablet Take 1 tablet (2.5 mg total) by mouth 2 (two) times daily.  . carvedilol (COREG) 6.25 MG tablet Take 6.25 mg by mouth daily with breakfast.  . cholecalciferol (VITAMIN D) 1000 units tablet Take 1,000 Units by mouth at bedtime.  Marland Kitchen lisinopril (PRINIVIL,ZESTRIL) 40 MG tablet Take 0.5 tablets (20 mg total) by mouth daily with breakfast. HOLD UNTIL FOLLOW UP WITH PCP  . loratadine (CLARITIN) 10 MG tablet Take 10 mg by mouth daily with breakfast.   . meclizine (ANTIVERT) 25 MG tablet Take 25 mg by mouth 2 (two) times daily.   . metFORMIN (GLUCOPHAGE) 1000 MG tablet Take 1,000 mg by mouth 2 (two) times daily with a meal.  . Multiple Vitamin (MULTIVITAMIN WITH MINERALS) TABS tablet Take 1 tablet by mouth at bedtime.  . ondansetron (ZOFRAN) 4 MG tablet Take 1 tablet (4 mg total) by mouth every 6 (six) hours. (Patient taking differently: Take 4 mg by mouth every 6 (six) hours as needed for nausea. )  . Propylene Glycol (SYSTANE BALANCE) 0.6 % SOLN Place 1-2 drops into both eyes daily as needed (for dry eyes).  . simvastatin (ZOCOR) 20 MG tablet Take 10 mg by mouth  daily with breakfast.  . tamsulosin (FLOMAX) 0.4 MG CAPS capsule Take 1 capsule (0.4 mg total) by mouth daily.  . traMADol (ULTRAM) 50 MG tablet Take 100 mg by mouth every 6 (six) hours as needed for moderate pain.  . [DISCONTINUED] diltiazem (CARDIZEM SR) 60 MG 12 hr capsule Take 1 capsule (60 mg total) by mouth every 12 (twelve) hours.    No Known Allergies  Social History   Social History  . Marital status: Widowed    Spouse name: N/A  . Number of children: N/A  . Years of education: N/A   Social History Main Topics  . Smoking status: Never Smoker  . Smokeless tobacco: Never Used  . Alcohol use No  . Drug use: No  . Sexual activity: Not Asked   Other Topics Concern  . None   Social History Narrative  . None    family history includes Lung cancer in his father.  Wt Readings from Last 3 Encounters:  06/01/17 214 lb (97.1 kg)  05/09/17 232 lb 2.3 oz (105.3 kg)  05/07/17 222 lb (100.7 kg)    PHYSICAL EXAM BP 106/68   Pulse (!) 133   Ht 6' (1.829 m)   Wt 214 lb (97.1  kg)   BMI 29.02 kg/m  --> f/u pulse on recheck ~105 & 97 bpm. General appearance: alert, cooperative, appears stated age, no distress. Borderline obese. Pretty much well-nourished and well-groomed for age. HEENT: [place]/AT, EOMI, MMM, anicteric sclera Neck: no adenopathy, no carotid bruit and no JVD Lungs: Mostly clear to auscultation bilaterally- with some fine basal crackles, normal percussion bilaterally and non-labored Heart: Irregularly irregular rhythm with rapid rate. (Improved notably once he was sitting down in the room. When I left his heart rate was in the 90s.), S1 &S2 normal, no murmur, click, rub or gallop; nondisplaced PMI but somewhat hyperdynamic Abdomen: soft, non-tender; bowel sounds normal; no masses,  no organomegaly; no HJR; palpable abdominal aorta with no notable bruit. Extremities: extremities normal, atraumatic, no cyanosis, and edema - trivial Pulses: 2+ and symmetric;  Skin:  mobility and turgor normal, no evidence of bleeding or bruising and no lesions noted or  Neurologic: Mental status: Alert & oriented x 3, thought content appropriate; non-focal exam.  Pleasant mood & affect. Cranial nerves: normal (II-XII grossly intact)    Adult ECG Report  Rate: 133 ;  Rhythm: atrial fibrillation, premature ventricular contractions (PVC) and RVR with possible aberrantly conducted beats. Otherwise normal axis, intervals & durations.;   Narrative Interpretation: Abnormal - A. fib with RVR   Other studies Reviewed: Additional studies/ records that were reviewed today include:  Recent Labs:   Lab Results  Component Value Date   CREATININE 1.71 (H) 05/11/2017   CREATININE 2.37 (H) 05/10/2017   CREATININE 2.76 (H) 05/09/2017   Lab Results  Component Value Date   CREATININE 1.71 (H) 05/11/2017   BUN 26 (H) 05/11/2017   NA 139 05/11/2017   K 4.2 05/11/2017   CL 108 05/11/2017   CO2 22 05/11/2017   CBC Latest Ref Rng & Units 05/11/2017 05/10/2017 05/09/2017  WBC 4.0 - 10.5 K/uL 10.2 11.3(H) 14.8(H)  Hemoglobin 13.0 - 17.0 g/dL 10.9(L) 11.4(L) 12.2(L)  Hematocrit 39.0 - 52.0 % 32.0(L) 34.2(L) 35.8(L)  Platelets 150 - 400 K/uL 207 202 203   Lab Results  Component Value Date   TSH 0.864 05/09/2017    ASSESSMENT / PLAN: Problem List Items Addressed This Visit    AAA (abdominal aortic aneurysm) without rupture (HCC)    Abnormalities noted on CT nephrogram. Will need a more dedicated CT angiogram to assess and couple months once his renal function stabilizes.      Relevant Medications   amiodarone (PACERONE) 200 MG tablet   AKI (acute kidney injury) (HCC)    Renal function has notably improved with creatinine own to 1.71 prior to discharge      Atrial fibrillation with RVR (HCC) - Primary (Chronic)    This was a new diagnosis for him during the hospital stay, but we really don't know how long he has been in it. He is now on ELIQUIS and has been so for 3 weeks.  Unfortunately for his cystoscopy, he will probably need to be off of it. This puts us in a predicament because I wouldn't otherwise consider attempting cardioversion next week after he has been on ELIQUIS for 4 weeks. As such, since he still has poorly controlled rates without even having any symptoms, I plan to switch from diltiazem to amiodarone which will be used for short-term rate/rhythm control until his urologic issues are resolved.  If he remains in atrial fibrillation and is symptomatic following his cystoscopy, we could consider TEE guided cardioversion while back on ELIQUIS. However, if  he is basically the medic, I would probably prefer to wait and not attempt cardioversion until he has been loaded on amiodarone.  Plan: Stop diltiazem (his blood pressure is borderline low today) to avoid hypotension  Amiodarone loading: 400 mg twice a day for 5 days, 40 mg once a day for 5 days, 200 mg daily until follow-up  Nurse visit with EKG Wednesday (also check blood pressure)  If his blood pressure remains low, would reduce lisinopril to 10 mg      Relevant Medications   amiodarone (PACERONE) 200 MG tablet   Other Relevant Orders   EKG 12-Lead   Essential hypertension (Chronic)    Borderline hypotensive today.   Plan: DC diltiazem. Low threshold to reduce ACE inhibitor dose to 10 mg      Relevant Medications   amiodarone (PACERONE) 200 MG tablet   Hyponatremia    Resolved      Preop cardiovascular exam    Estle is a relatively asymptomatic with his atrial fibrillation.   Urologic procedures are of low cardiac risk, and he is a symptomatically with a heart rate of 133 bpm which was essentially mean he is passing equivalent a GXT. - No active angina or heart failure with preserved EF on echo. I would not pursue any further cardiac evaluation besides an EKG evaluation next week.  His main perioperative risk factor is the fact he has a history of mild stroke, but does not have active  angina, heart failure or insulin-dependent diabetes. He is on oral medications for diabetes. His renal function is now improved with a creatinine less than 2.  Recommendations: Proceed with urologic procedure as scheduled  I am seeking to rate/rhythm control him with amiodarone with the pending procedure.  We need to clarify with urology, but my impression is that they would probably want ELIQUIS held - if so he will take [facility] in the morning of Wednesday, June 27 and then stop until Saturday morning June 30th.      Relevant Medications   amiodarone (PACERONE) 200 MG tablet   Other Relevant Orders   EKG 12-Lead   Type 2 diabetes mellitus without complication (HCC) (Chronic)   Ureteral stone with hydronephrosis    Pending cystoscopy with stent procedure or sent removal         Current medicines are reviewed at length with the patient today. (+/- concerns) n/a The following changes have been made: see instructions   Patient Instructions  Medication start tonight  Amiodarone Take 400 mg ( 2 tablets ) by mouth twice a day for 5 days then take 400 mg ( 2 tablets) by mouth once daily for 5 days then 200 mg daily    Stop diltiazem now    starting on Wednesday June 06 2017 Take eliqius morning dose then stop until after Surgery. Restart on Saturday June 09 2017    Your physician recommends that you schedule a follow-up appointment in Wednesday June 06 2080 for ekg with nurse's visit. Will be able to clear you for surgery at that time.   Your physician recommends that you schedule a follow-up appointment in July 09 2017 with Dr Herbie Baltimore     Studies Ordered:   Orders Placed This Encounter  Procedures  . EKG 12-Lead      Bryan Lemma, M.D., M.S. Interventional Cardiologist   Pager # [phone] Phone # [phone] [address] [place], [place] [address]

## 2017-06-03 ENCOUNTER — Encounter: Payer: Self-pay | Admitting: Cardiology

## 2017-06-03 DIAGNOSIS — I714 Abdominal aortic aneurysm, without rupture, unspecified: Secondary | ICD-10-CM | POA: Insufficient documentation

## 2017-06-03 HISTORY — DX: Abdominal aortic aneurysm, without rupture: I71.4

## 2017-06-03 HISTORY — DX: Abdominal aortic aneurysm, without rupture, unspecified: I71.40

## 2017-06-03 NOTE — Assessment & Plan Note (Signed)
Renal function has notably improved with creatinine own to 1.71 prior to discharge

## 2017-06-03 NOTE — Assessment & Plan Note (Signed)
Resolved

## 2017-06-03 NOTE — Assessment & Plan Note (Signed)
Pending cystoscopy with stent procedure or sent removal

## 2017-06-03 NOTE — Assessment & Plan Note (Signed)
Borderline hypotensive today.   Plan: DC diltiazem. Low threshold to reduce ACE inhibitor dose to 10 mg

## 2017-06-03 NOTE — Assessment & Plan Note (Signed)
Charles Richard is a relatively asymptomatic with his atrial fibrillation.   Urologic procedures are of low cardiac risk, and he is a symptomatically with a heart rate of 133 bpm which was essentially mean he is passing equivalent a GXT. - No active angina or heart failure with preserved EF on echo. I would not pursue any further cardiac evaluation besides an EKG evaluation next week.  His main perioperative risk factor is the fact he has a history of mild stroke, but does not have active angina, heart failure or insulin-dependent diabetes. He is on oral medications for diabetes. His renal function is now improved with a creatinine less than 2.  Recommendations: Proceed with urologic procedure as scheduled  I am seeking to rate/rhythm control him with amiodarone with the pending procedure.  We need to clarify with urology, but my impression is that they would probably want ELIQUIS held - if so [facility], [facility] and then stop until Saturday morning June 30th.

## 2017-06-03 NOTE — Assessment & Plan Note (Signed)
Abnormalities noted on CT nephrogram. Will need a more dedicated CT angiogram to assess and couple months once his renal function stabilizes.

## 2017-06-03 NOTE — Assessment & Plan Note (Signed)
This was a new diagnosis for him during the hospital stay, but we really don't know how long he has been in it. He is now on ELIQUIS and has been so for 3 weeks. Unfortunately for his cystoscopy, he will probably need to be off of it. This puts us in a predicament because I wouldn't otherwise consider attempting cardioversion next week after he has been on ELIQUIS for 4 weeks. As such, since he still has poorly controlled rates without even having any symptoms, I plan to switch from diltiazem to amiodarone which will be used for short-term rate/rhythm control until his urologic issues are resolved.  If he remains in atrial fibrillation and is symptomatic following his cystoscopy, we could consider TEE guided cardioversion while back on ELIQUIS. However, if he is basically the medic, I would probably prefer to wait and not attempt cardioversion until he has been loaded on amiodarone.  Plan: Stop diltiazem (his blood pressure is borderline low today) to avoid hypotension  Amiodarone loading: 400 mg twice a day for 5 days, 40 mg once a day for 5 days, 200 mg daily until follow-up  Nurse visit with EKG Wednesday (also check blood pressure)  If his blood pressure remains low, would reduce lisinopril to 10 mg

## 2017-06-04 NOTE — Patient Instructions (Signed)
Charles Richard  06/04/2017   Your procedure is scheduled on: 06/08/17  [facility]  Entrance [place]vators to 3rd floor to  Short Stay Center at      [phone].    Call this number if you have problems the morning of surgery [phone]    Remember: ONLY 1 PERSON MAY GO WITH YOU TO SHORT STAY TO GET  READY MORNING OF YOUR SURGERY.  Do not eat food or drink liquids :After Midnight.     Take these medicines the morning of surgery with A SIP OF WATER: carvedilol(coreg), amiodarone, claritin, simvastatin, flomax, meclizine                                You may not have any metal on your body including hair pins and              piercings  Do not wear jewelry,lotions, powders or perfumes, deodorant                       Men may shave face and neck.   Do not bring valuables to the hospital. [facility] IS NOT             RESPONSIBLE   FOR VALUABLES.  Contacts, dentures or bridgework may not be worn into surgery.  Leave suitcase in the car. After surgery it may be brought to your room.     Patients discharged the day of surgery will not be allowed to drive home.  Name and phone number of your driver:  Special Instructions: N/A              Please read over the following fact sheets you were given: _____________________________________________________________________             [facility] - Preparing for Surgery Before surgery, you can play an important role.  Because skin is not sterile, your skin needs to be as free of germs as possible.  You can reduce the number of germs on your skin by washing with CHG (chlorahexidine gluconate) soap before surgery.  CHG is an antiseptic cleaner which kills germs and bonds with the skin to continue killing germs even after washing. Please DO NOT use if you have an allergy to CHG or antibacterial soaps.  If your skin becomes reddened/irritated stop using the CHG and inform your nurse when you arrive  at Short Stay. Do not shave (including legs and underarms) for at least 48 hours prior to the first CHG shower.  You may shave your face/neck. Please follow these instructions carefully:  1.  Shower with CHG Soap the night before surgery and the  morning of Surgery.  2.  If you choose to wash your hair, wash your hair first as usual with your  normal  shampoo.  3.  After you shampoo, rinse your hair and body thoroughly to remove the  shampoo.                           4.  Use CHG as you would any other liquid soap.  You can apply chg directly  to the skin and wash  Gently with a scrungie or clean washcloth.  5.  Apply the CHG Soap to your body ONLY FROM THE NECK DOWN.   Do not use on face/ open                           Wound or open sores. Avoid contact with eyes, ears mouth and genitals (private parts).                       Wash face,  Genitals (private parts) with your normal soap.             6.  Wash thoroughly, paying special attention to the area where your surgery  will be performed.  7.  Thoroughly rinse your body with warm water from the neck down.  8.  DO NOT shower/wash with your normal soap after using and rinsing off  the CHG Soap.                9.  Pat yourself dry with a clean towel.            10.  Wear clean pajamas.            11.  Place clean sheets on your bed the night of your first shower and do not  sleep with pets. Day of Surgery : Do not apply any lotions/deodorants the morning of surgery.  Please wear clean clothes to the hospital/surgery center.  FAILURE TO FOLLOW THESE INSTRUCTIONS MAY RESULT IN THE CANCELLATION OF YOUR SURGERY PATIENT SIGNATURE_________________________________  NURSE SIGNATURE__________________________________  ________________________________________________________________________

## 2017-06-05 ENCOUNTER — Encounter (HOSPITAL_COMMUNITY): Payer: Self-pay

## 2017-06-05 ENCOUNTER — Encounter (HOSPITAL_COMMUNITY)
Admission: RE | Admit: 2017-06-05 | Discharge: 2017-06-05 | Disposition: A | Discharge status: Home / Self Care | Payer: Medicare Other | Source: Ambulatory Visit | Attending: Urology | Admitting: Urology

## 2017-06-05 DIAGNOSIS — Z01810 Encounter for preprocedural cardiovascular examination: Secondary | ICD-10-CM | POA: Insufficient documentation

## 2017-06-05 DIAGNOSIS — I10 Essential (primary) hypertension: Secondary | ICD-10-CM | POA: Diagnosis not present

## 2017-06-05 DIAGNOSIS — M1990 Unspecified osteoarthritis, unspecified site: Secondary | ICD-10-CM | POA: Diagnosis not present

## 2017-06-05 DIAGNOSIS — N358 Other urethral stricture: Secondary | ICD-10-CM | POA: Diagnosis not present

## 2017-06-05 DIAGNOSIS — Z01812 Encounter for preprocedural laboratory examination: Secondary | ICD-10-CM | POA: Insufficient documentation

## 2017-06-05 DIAGNOSIS — Z7982 Long term (current) use of aspirin: Secondary | ICD-10-CM | POA: Diagnosis not present

## 2017-06-05 DIAGNOSIS — Z7984 Long term (current) use of oral hypoglycemic drugs: Secondary | ICD-10-CM | POA: Diagnosis not present

## 2017-06-05 DIAGNOSIS — Z79899 Other long term (current) drug therapy: Secondary | ICD-10-CM | POA: Diagnosis not present

## 2017-06-05 DIAGNOSIS — E1151 Type 2 diabetes mellitus with diabetic peripheral angiopathy without gangrene: Secondary | ICD-10-CM | POA: Diagnosis not present

## 2017-06-05 DIAGNOSIS — N132 Hydronephrosis with renal and ureteral calculous obstruction: Secondary | ICD-10-CM | POA: Diagnosis not present

## 2017-06-05 DIAGNOSIS — Z8673 Personal history of transient ischemic attack (TIA), and cerebral infarction without residual deficits: Secondary | ICD-10-CM | POA: Diagnosis not present

## 2017-06-05 DIAGNOSIS — Z7901 Long term (current) use of anticoagulants: Secondary | ICD-10-CM | POA: Diagnosis not present

## 2017-06-05 DIAGNOSIS — N179 Acute kidney failure, unspecified: Secondary | ICD-10-CM | POA: Diagnosis not present

## 2017-06-05 DIAGNOSIS — Z87442 Personal history of urinary calculi: Secondary | ICD-10-CM | POA: Diagnosis not present

## 2017-06-05 DIAGNOSIS — I4891 Unspecified atrial fibrillation: Secondary | ICD-10-CM | POA: Diagnosis not present

## 2017-06-05 HISTORY — DX: Cerebral infarction, unspecified: I63.9

## 2017-06-05 HISTORY — DX: Acute myocardial infarction, unspecified: I21.9

## 2017-06-05 HISTORY — DX: Cardiac arrhythmia, unspecified: I49.9

## 2017-06-05 HISTORY — DX: Personal history of urinary calculi: Z87.442

## 2017-06-05 HISTORY — DX: Unspecified osteoarthritis, unspecified site: M19.90

## 2017-06-05 LAB — CBC
HEMATOCRIT: 42.4 % (ref 39.0–52.0)
HEMOGLOBIN: 14.2 g/dL (ref 13.0–17.0)
MCH: 32 pg (ref 26.0–34.0)
MCHC: 33.5 g/dL (ref 30.0–36.0)
MCV: 95.5 fL (ref 78.0–100.0)
Platelets: 256 10*3/uL (ref 150–400)
RBC: 4.44 MIL/uL (ref 4.22–5.81)
RDW: 12.9 % (ref 11.5–15.5)
WBC: 11.7 10*3/uL — AB (ref 4.0–10.5)

## 2017-06-05 LAB — BASIC METABOLIC PANEL
ANION GAP: 8 (ref 5–15)
BUN: 33 mg/dL — AB (ref 6–20)
CHLORIDE: 113 mmol/L — AB (ref 101–111)
CO2: 23 mmol/L (ref 22–32)
Calcium: 9.6 mg/dL (ref 8.9–10.3)
Creatinine, Ser: 1.76 mg/dL — ABNORMAL HIGH (ref 0.61–1.24)
GFR, EST AFRICAN AMERICAN: 39 mL/min — AB (ref >60)
GFR, EST NON AFRICAN AMERICAN: 34 mL/min — AB (ref >60)
Glucose, Bld: 161 mg/dL — ABNORMAL HIGH (ref 65–99)
Potassium: 5.5 mmol/L — ABNORMAL HIGH (ref 3.5–5.1)
Sodium: 144 mmol/L (ref 135–145)

## 2017-06-05 LAB — GLUCOSE, CAPILLARY: Glucose-Capillary: 186 mg/dL — ABNORMAL HIGH (ref 65–99)

## 2017-06-06 ENCOUNTER — Ambulatory Visit (INDEPENDENT_AMBULATORY_CARE_PROVIDER_SITE_OTHER): Payer: Medicare Other

## 2017-06-06 DIAGNOSIS — I4891 Unspecified atrial fibrillation: Secondary | ICD-10-CM

## 2017-06-06 LAB — HEMOGLOBIN A1C
HEMOGLOBIN A1C: 6.1 % — AB (ref 4.8–5.6)
MEAN PLASMA GLUCOSE: 128 mg/dL

## 2017-06-06 NOTE — Progress Notes (Signed)
Patient came in for an EKG. Per Dr. Herbie BaltimoreHarding, the patient should stay on Amiodarone 400 mg twice daily until after the surgery, then Amiodarone 400 mg daily for 5 days, and then Amiodarone 200 mg daily. Patient's son verbalized understanding and was instructed to call if he had any questions.

## 2017-06-08 ENCOUNTER — Encounter (HOSPITAL_COMMUNITY): Payer: Self-pay | Admitting: *Deleted

## 2017-06-08 ENCOUNTER — Encounter (HOSPITAL_COMMUNITY)
Admission: RE | Disposition: A | Discharge status: Home / Self Care | Payer: Self-pay | Source: Ambulatory Visit | Attending: Urology

## 2017-06-08 ENCOUNTER — Ambulatory Visit (HOSPITAL_COMMUNITY): Payer: Medicare Other

## 2017-06-08 ENCOUNTER — Ambulatory Visit (HOSPITAL_COMMUNITY): Payer: Medicare Other | Admitting: Certified Registered Nurse Anesthetist

## 2017-06-08 ENCOUNTER — Ambulatory Visit (HOSPITAL_COMMUNITY)
Admission: RE | Admit: 2017-06-08 | Discharge: 2017-06-08 | Disposition: A | Discharge status: Home / Self Care | Payer: Medicare Other | Source: Ambulatory Visit | Attending: Urology | Admitting: Urology

## 2017-06-08 DIAGNOSIS — Z87442 Personal history of urinary calculi: Secondary | ICD-10-CM | POA: Insufficient documentation

## 2017-06-08 DIAGNOSIS — N132 Hydronephrosis with renal and ureteral calculous obstruction: Secondary | ICD-10-CM | POA: Insufficient documentation

## 2017-06-08 DIAGNOSIS — E1151 Type 2 diabetes mellitus with diabetic peripheral angiopathy without gangrene: Secondary | ICD-10-CM | POA: Diagnosis not present

## 2017-06-08 DIAGNOSIS — Z7901 Long term (current) use of anticoagulants: Secondary | ICD-10-CM | POA: Insufficient documentation

## 2017-06-08 DIAGNOSIS — I10 Essential (primary) hypertension: Secondary | ICD-10-CM | POA: Insufficient documentation

## 2017-06-08 DIAGNOSIS — I4891 Unspecified atrial fibrillation: Secondary | ICD-10-CM | POA: Insufficient documentation

## 2017-06-08 DIAGNOSIS — N358 Other urethral stricture: Secondary | ICD-10-CM | POA: Diagnosis not present

## 2017-06-08 DIAGNOSIS — N179 Acute kidney failure, unspecified: Secondary | ICD-10-CM | POA: Insufficient documentation

## 2017-06-08 DIAGNOSIS — Z79899 Other long term (current) drug therapy: Secondary | ICD-10-CM | POA: Insufficient documentation

## 2017-06-08 DIAGNOSIS — M1990 Unspecified osteoarthritis, unspecified site: Secondary | ICD-10-CM | POA: Insufficient documentation

## 2017-06-08 DIAGNOSIS — Z7982 Long term (current) use of aspirin: Secondary | ICD-10-CM | POA: Insufficient documentation

## 2017-06-08 DIAGNOSIS — Z8673 Personal history of transient ischemic attack (TIA), and cerebral infarction without residual deficits: Secondary | ICD-10-CM | POA: Insufficient documentation

## 2017-06-08 DIAGNOSIS — Z7984 Long term (current) use of oral hypoglycemic drugs: Secondary | ICD-10-CM | POA: Insufficient documentation

## 2017-06-08 HISTORY — PX: CYSTOSCOPY WITH RETROGRADE PYELOGRAM, URETEROSCOPY AND STENT PLACEMENT: SHX5789

## 2017-06-08 HISTORY — PX: HOLMIUM LASER APPLICATION: SHX5852

## 2017-06-08 LAB — GLUCOSE, CAPILLARY
GLUCOSE-CAPILLARY: 118 mg/dL — AB (ref 65–99)
Glucose-Capillary: 142 mg/dL — ABNORMAL HIGH (ref 65–99)

## 2017-06-08 SURGERY — CYSTOURETEROSCOPY, WITH RETROGRADE PYELOGRAM AND STENT INSERTION
Anesthesia: General

## 2017-06-08 MED ORDER — LIDOCAINE 2% (20 MG/ML) 5 ML SYRINGE
INTRAMUSCULAR | Status: AC
  Filled 2017-06-08: qty 5

## 2017-06-08 MED ORDER — CEFAZOLIN SODIUM-DEXTROSE 2-4 GM/100ML-% IV SOLN
2.0000 g | INTRAVENOUS | Status: AC
  Administered 2017-06-08: 2 g via INTRAVENOUS
  Filled 2017-06-08: qty 100

## 2017-06-08 MED ORDER — FENTANYL CITRATE (PF) 100 MCG/2ML IJ SOLN
INTRAMUSCULAR | Status: DC | PRN
  Administered 2017-06-08 (×4): 25 ug via INTRAVENOUS

## 2017-06-08 MED ORDER — FENTANYL CITRATE (PF) 100 MCG/2ML IJ SOLN: 25.0000 ug | INTRAMUSCULAR | Status: DC | PRN

## 2017-06-08 MED ORDER — PHENYLEPHRINE HCL 10 MG/ML IJ SOLN
INTRAMUSCULAR | Status: DC | PRN
  Administered 2017-06-08 (×2): 80 ug via INTRAVENOUS

## 2017-06-08 MED ORDER — LACTATED RINGERS IV SOLN
INTRAVENOUS | Status: DC
  Administered 2017-06-08: 09:00:00 via INTRAVENOUS

## 2017-06-08 MED ORDER — PROMETHAZINE HCL 25 MG/ML IJ SOLN: 6.2500 mg | INTRAMUSCULAR | Status: DC | PRN

## 2017-06-08 MED ORDER — PROPOFOL 10 MG/ML IV BOLUS
INTRAVENOUS | Status: DC | PRN
  Administered 2017-06-08: 150 mg via INTRAVENOUS

## 2017-06-08 MED ORDER — CEPHALEXIN 500 MG PO CAPS: 500.0000 mg | ORAL_CAPSULE | Freq: Every day | ORAL | 0 refills | Status: DC

## 2017-06-08 MED ORDER — LIDOCAINE 2% (20 MG/ML) 5 ML SYRINGE
INTRAMUSCULAR | Status: DC | PRN
  Administered 2017-06-08: 100 mg via INTRAVENOUS

## 2017-06-08 MED ORDER — FENTANYL CITRATE (PF) 100 MCG/2ML IJ SOLN
INTRAMUSCULAR | Status: AC
  Filled 2017-06-08: qty 2

## 2017-06-08 MED ORDER — SODIUM CHLORIDE 0.9 % IR SOLN
Status: DC | PRN
  Administered 2017-06-08: 7000 mL via INTRAVESICAL

## 2017-06-08 MED ORDER — PROPOFOL 10 MG/ML IV BOLUS
INTRAVENOUS | Status: AC
  Filled 2017-06-08: qty 20

## 2017-06-08 MED ORDER — SODIUM CHLORIDE 0.9 % IV SOLN
INTRAVENOUS | Status: DC | PRN
  Administered 2017-06-08: 50 ug/min via INTRAVENOUS

## 2017-06-08 MED ORDER — ONDANSETRON HCL 4 MG/2ML IJ SOLN
INTRAMUSCULAR | Status: DC | PRN
  Administered 2017-06-08: 4 mg via INTRAVENOUS

## 2017-06-08 MED ORDER — DEXAMETHASONE SODIUM PHOSPHATE 10 MG/ML IJ SOLN
INTRAMUSCULAR | Status: AC
  Filled 2017-06-08: qty 1

## 2017-06-08 MED ORDER — ONDANSETRON HCL 4 MG/2ML IJ SOLN
INTRAMUSCULAR | Status: AC
  Filled 2017-06-08: qty 2

## 2017-06-08 SURGICAL SUPPLY — 31 items
BAG URINE DRAINAGE (UROLOGICAL SUPPLIES) IMPLANT
BAG URO CATCHER STRL LF (MISCELLANEOUS) ×4 IMPLANT
BALLN NEPHROSTOMY (BALLOONS)
BALLOON NEPHROSTOMY (BALLOONS) IMPLANT
BASKET ZERO TIP NITINOL 2.4FR (BASKET) ×4 IMPLANT
CATH FOLEY 2W COUNCIL 20FR 5CC (CATHETERS) IMPLANT
CATH INTERMIT  6FR 70CM (CATHETERS) ×4 IMPLANT
CATH URET 5FR 28IN CONE TIP (BALLOONS)
CATH URET 5FR 70CM CONE TIP (BALLOONS) IMPLANT
CATH URET WHISTLE 6FR (CATHETERS) IMPLANT
CLOTH BEACON ORANGE TIMEOUT ST (SAFETY) ×4 IMPLANT
COVER SURGICAL LIGHT HANDLE (MISCELLANEOUS) ×4 IMPLANT
EXTRACTOR STONE NITINOL NGAGE (UROLOGICAL SUPPLIES) ×8 IMPLANT
FIBER LASER FLEXIVA 1000 (UROLOGICAL SUPPLIES) IMPLANT
FIBER LASER FLEXIVA 365 (UROLOGICAL SUPPLIES) ×4 IMPLANT
FIBER LASER FLEXIVA 550 (UROLOGICAL SUPPLIES) IMPLANT
FIBER LASER TRAC TIP (UROLOGICAL SUPPLIES) IMPLANT
GLOVE BIO SURGEON STRL SZ7.5 (GLOVE) ×4 IMPLANT
GOWN STRL REUS W/TWL XL LVL3 (GOWN DISPOSABLE) ×4 IMPLANT
GUIDEWIRE ANG ZIPWIRE 038X150 (WIRE) ×4 IMPLANT
GUIDEWIRE STR DUAL SENSOR (WIRE) ×4 IMPLANT
MANIFOLD NEPTUNE II (INSTRUMENTS) ×4 IMPLANT
NS IRRIG 1000ML POUR BTL (IV SOLUTION) IMPLANT
PACK CYSTO (CUSTOM PROCEDURE TRAY) ×4 IMPLANT
SHEATH ACCESS URETERAL 24CM (SHEATH) IMPLANT
SHEATH ACCESS URETERAL 38CM (SHEATH) ×4 IMPLANT
STENT CONTOUR 6FRX24X.038 (STENTS) ×4 IMPLANT
TUBING CONNECTING 10 (TUBING) ×3 IMPLANT
TUBING CONNECTING 10' (TUBING) ×1
WATER STERILE IRR 3000ML UROMA (IV SOLUTION) IMPLANT
WIRE COONS/BENSON .038X145CM (WIRE) IMPLANT

## 2017-06-08 NOTE — Interval H&P Note (Signed)
History and Physical Interval Note:  06/08/2017 9:29 AM  Charles Richard  has presented today for surgery, with the diagnosis of LEFT URETERAL STONE AND LEFT RENAL STONE  The various methods of treatment have been discussed with the patient and family. After consideration of risks, benefits and other options for treatment, the patient has consented to  Procedure(s): CYSTOSCOPY WITH LEFT RETROGRADE PYELOGRAM, URETEROSCOPY HOLMIUM LASER LITHOTRIPSY BASKET EXTRACTION  AND STENT EXCHANGE (Left) HOLMIUM LASER APPLICATION (Left) CYSTOSCOPY WITH POSSIBLE  DILATATION OF  URETERAL STRICTURE (N/A) as a surgical intervention .  The patient's history has been reviewed, patient examined, no change in status, stable for surgery. He's been well with no fever, chills or dysuria. Discussed with patient and his daughter and reviewed CT nature r/b of left URS to clear the ureteral stone and also as much LLP stone as possible. We discussed he may need a staged procedure to clear the LLP, but they want to limit a staged procedure if at all possible, so we'll have to balance the two. We discussed any fragments or stones that remain in the LLP can pass and start this process all over again. I have reviewed the patient's chart and labs.  Questions were answered to the patient's satisfaction.     Amoreena Neubert

## 2017-06-08 NOTE — Anesthesia Procedure Notes (Signed)
Procedure Name: LMA Insertion Date/Time: 06/08/2017 9:54 AM Performed by: Orest DikesPETERS, Zulma Court J Pre-anesthesia Checklist: Patient identified, Emergency Drugs available, Suction available and Patient being monitored Patient Re-evaluated:Patient Re-evaluated prior to inductionOxygen Delivery Method: Circle system utilized Preoxygenation: Pre-oxygenation with 100% oxygen Intubation Type: IV induction LMA: LMA with gastric port inserted LMA Size: 5.0 Number of attempts: 1 Placement Confirmation: positive ETCO2 and breath sounds checked- equal and bilateral Tube secured with: Tape Dental Injury: Teeth and Oropharynx as per pre-operative assessment

## 2017-06-08 NOTE — Interval H&P Note (Signed)
History and Physical Interval Note:  06/08/2017 9:33 AM  Charles Richard   -- I should add his hospital and office urine Cx only grew mixed growth and pt was started on cephalexin 6.26.   Gladine Plude

## 2017-06-08 NOTE — Discharge Instructions (Signed)
General Anesthesia, Adult, Care After These instructions provide you with information about caring for yourself after your procedure. Your health care provider may also give you more specific instructions. Your treatment has been planned according to current medical practices, but problems sometimes occur. Call your health care provider if you have any problems or questions after your procedure. What can I expect after the procedure? After the procedure, it is common to have:  Vomiting.  A sore throat.  Mental slowness.  It is common to feel:  Nauseous.  Cold or shivery.  Sleepy.  Tired.  Sore or achy, even in parts of your body where you did not have surgery.  Follow these instructions at home: For at least 24 hours after the procedure:  Do not: ? Participate in activities where you could fall or become injured. ? Drive. ? Use heavy machinery. ? Drink alcohol. ? Take sleeping pills or medicines that cause drowsiness. ? Make important decisions or sign legal documents. ? Take care of children on your own.  Rest. Eating and drinking  If you vomit, drink water, juice, or soup when you can drink without vomiting.  Drink enough fluid to keep your urine clear or pale yellow.  Make sure you have little or no nausea before eating solid foods.  Follow the diet recommended by your health care provider. General instructions  Have a responsible adult stay with you until you are awake and alert.  Return to your normal activities as told by your health care provider. Ask your health care provider what activities are safe for you.  Take over-the-counter and prescription medicines only as told by your health care provider.  If you smoke, do not smoke without supervision.  Keep all follow-up visits as told by your health care provider. This is important. Contact a health care provider if:  You continue to have nausea or vomiting at home, and medicines are not helpful.  You  cannot drink fluids or start eating again.  You cannot urinate after 8-12 hours.  You develop a skin rash.  You have fever.  You have increasing redness at the site of your procedure. Get help right away if:  You have difficulty breathing.  You have chest pain.  You have unexpected bleeding.  You feel that you are having a life-threatening or urgent problem. This information is not intended to replace advice given to you by your health care provider. Make sure you discuss any questions you have with your health care provider. Document Released: 03/05/2001 Document Revised: 05/01/2016 Document Reviewed: 11/11/2015 Elsevier Interactive Patient Education  2018 [employer] Ureteral Stent Implantation, Care After Refer to this sheet in the next few weeks. These instructions provide you with information about caring for yourself after your procedure. Your health care provider may also give you more specific instructions. Your treatment has been planned according to current medical practices, but problems sometimes occur. Call your health care provider if you have any problems or questions after your procedure.  Removal of the stent: Remove the stent by pulling the string with slow steady pressure on Tuesday morning, 06/12/2017.  What can I expect after the procedure? After the procedure, it is common to have:  Nausea.  Mild pain when you urinate. You may feel this pain in your lower back or lower abdomen. Pain should stop within a few minutes after you urinate. This may last for up to 1 week.  A small amount of blood in your urine for several days.  Follow these instructions at home:  Medicines  Take over-the-counter and prescription medicines only as told by your health care provider.  If you were prescribed an antibiotic medicine, take it as told by your health care provider. Do not stop taking the antibiotic even if you start to feel better.  Do not drive for 24 hours if  you received a sedative.  Do not drive or operate heavy machinery while taking prescription pain medicines. Activity  Return to your normal activities as told by your health care provider. Ask your health care provider what activities are safe for you.  Do not lift anything that is heavier than 10 lb (4.5 kg). Follow this limit for 1 week after your procedure, or for as long as told by your health care provider. General instructions  Watch for any blood in your urine. Call your health care provider if the amount of blood in your urine increases.  If you have a catheter: ? Follow instructions from your health care provider about taking care of your catheter and collection bag. ? Do not take baths, swim, or use a hot tub until your health care provider approves.  Drink enough fluid to keep your urine clear or pale yellow.  Keep all follow-up visits as told by your health care provider. This is important. Contact a health care provider if:  You have pain that gets worse or does not get better with medicine, especially pain when you urinate.  You have difficulty urinating.  You feel nauseous or you vomit repeatedly during a period of more than 2 days after the procedure. Get help right away if:  Your urine is dark red or has blood clots in it.  You are leaking urine (have incontinence).  The end of the stent comes out of your urethra.  You cannot urinate.  You have sudden, sharp, or severe pain in your abdomen or lower back.  You have a fever. This information is not intended to replace advice given to you by your health care provider. Make sure you discuss any questions you have with your health care provider. Document Released: 07/30/2013 Document Revised: 05/04/2016 Document Reviewed: 06/11/2015 Elsevier Interactive Patient Education  [employer].

## 2017-06-08 NOTE — Transfer of Care (Signed)
Immediate Anesthesia Transfer of Care Note  Patient: Charles Richard  Procedure(s) Performed: Procedure(s): CYSTOSCOPY WITH LEFT RETROGRADE PYELOGRAM, URETEROSCOPY HOLMIUM LASER LITHOTRIPSY BASKET EXTRACTION  AND STENT EXCHANGE (Left) HOLMIUM LASER APPLICATION (Left)  Patient Location: PACU  Anesthesia Type:General  Level of Consciousness:  sedated, patient cooperative and responds to stimulation  Airway & Oxygen Therapy:Patient Spontanous Breathing and Patient connected to face mask oxgen  Post-op Assessment:  Report given to PACU RN and Post -op Vital signs reviewed and stable  Post vital signs:  Reviewed and stable  Last Vitals:  Vitals:   06/08/17 0838  BP: 120/67  Pulse: 93  Resp: 18  Temp: 36.6 C    Complications: No apparent anesthesia complications

## 2017-06-08 NOTE — Op Note (Signed)
Preoperative diagnosis: Left renal stones, left ureteral stones Postoperative diagnosis: Left renal stones, left ureteral stones  Procedure: Left ureteroscopy, laser lithotripsy, stone basket extraction, left ureteral stent exchange  Surgeon: Mena GoesEskridge  Anesthesia: Gen.  Indication for procedure: 83 year old white male with history of urgent stent for potential sepsis a few weeks ago brought back today for definitive stone management.  Findings: On cystoscopy there was a moderate stricture of the bulbar urethra which was short and the scope dilated. There were no stones in the bladder at the left ureteral stent was in good position. On left ureteroscopy the 2 stones were noted in the left proximal ureter and multiple stones noted in the midpole and lower pole of the left kidney.  Based on the endoscopic findings and fluoroscopy a believe I cleared all the stones except for a more posterior lower pole stone and potentially the posterior midpole stone.  Description of procedure: After consent was obtained patient brought to the operating room. After adequate anesthesia he was placed in lithotomy position and prepped and draped in the usual sterile fashion. A timeout was performed to confirm the patient and procedure. Cystoscope was passed per urethra and the stricture dilated. The stent appeared encrusted on the distal coil but this turned out to be more mucus debris on the stent. I passed a sensor wire alongside the stent and then went back along the wire with the cystoscope grasped the stent and removed without difficulty. The stent in the ureter and proximal coil were quite clean. I then passed a semirigid ureteroscope without difficulty into the proximal ureter and found the 2 stones. With a 0 tip basket they were grasped and sequentially removed and drops in the bladder. I then passed the semirigid back into the proximal ureter and noted no other stones and passed a second wire under direct vision.  The side the Glidewire passed ureteral access sheath and then the digital ureteroscope. Several of the smaller stones were grasped with the in gauge and removed intact. I then sequentially transferred stones from the lower pole and drop them in the upper pole calyx and laser dose at a setting of 0.3 and 50 all the way down to 0.8 and 8. This by far dusted most of the stone burden. The midpole was difficult access and angle basket into it as it was about a 90 turn to get into that calyx. Despite using the 0 tip in the engage there may still be a stone back in this calyx. In the lower pole on was finally able to access the anterior largest stone and as it was fragmented it moved and was able to be grasped and put into the upper pole and finished off. There was still a stone in the more posterior lower pole calyx that I couldn't quite get to because the scope wouldn't angle there with the laser deployed, the engage basket couldn't grab the edge of it and at one point had a 0 tip around it but it wouldn't move and I was afraid the 0 tip was going to get stuck on the stone and not come off. Therefore I decided to leave this lower pole stone removed as many fragments from the mid and upper poles I could and on fluoroscopy and may be a faint stone in the midpole and the known stone in the lower pole. Based on the fluoroscopic and in the scopic findings of the kidney I believe he would do fine by removing the stent, so I planned  to leave a string on it. The ureteroscope was backed out with the access sheath and no fragments or injury were noted in the ureter. It was nicely dilated. The wire was backloaded on the cystoscope and a 6 x 26 and a meter stent advanced. The wire was removed with a good coil seen in the kidney and a good coil in the bladder. The patient was awakened and taken to the recovery room in stable condition.  Complications: None  Blood loss: Minimal  Specimens: Stone fragments to office  lab  Drains: 6 x 26 and a meter left ureteral stent with string  Disposition: Patient stable to PACU. CRNA commented that he remained stable throughout the case with good control of his A. fib.

## 2017-06-08 NOTE — Anesthesia Preprocedure Evaluation (Addendum)
Anesthesia Evaluation  Patient identified by MRN, date of birth, ID band Patient awake    Reviewed: Allergy & Precautions, NPO status , Patient's Chart, lab work & pertinent test results, reviewed documented beta blocker date and time   Airway Mallampati: II  TM Distance: >3 FB Neck ROM: Full    Dental  (+) Dental Advisory Given   Pulmonary neg pulmonary ROS,    breath sounds clear to auscultation       Cardiovascular hypertension, Pt. on medications and Pt. on home beta blockers + Peripheral Vascular Disease  + dysrhythmias Atrial Fibrillation  Rhythm:Regular Rate:Normal     Neuro/Psych CVA    GI/Hepatic negative GI ROS, Neg liver ROS,   Endo/Other  diabetes, Type 2, Oral Hypoglycemic Agents  Renal/GU CRFRenal disease     Musculoskeletal  (+) Arthritis ,   Abdominal   Peds  Hematology negative hematology ROS (+)   Anesthesia Other Findings   Reproductive/Obstetrics                            Lab Results  Component Value Date   WBC 11.7 (H) 06/05/2017   HGB 14.2 06/05/2017   HCT 42.4 06/05/2017   MCV 95.5 06/05/2017   PLT 256 06/05/2017   Lab Results  Component Value Date   CREATININE 1.76 (H) 06/05/2017   BUN 33 (H) 06/05/2017   NA 144 06/05/2017   K 5.5 (H) 06/05/2017   CL 113 (H) 06/05/2017   CO2 23 06/05/2017    Anesthesia Physical Anesthesia Plan  ASA: III  Anesthesia Plan: General   Post-op Pain Management:    Induction: Intravenous  PONV Risk Score and Plan: 3 and Ondansetron, Dexamethasone, Propofol and Treatment may vary due to age or medical condition  Airway Management Planned: LMA  Additional Equipment:   Intra-op Plan:   Post-operative Plan: Extubation in OR  Informed Consent: I have reviewed the patients History and Physical, chart, labs and discussed the procedure including the risks, benefits and alternatives for the proposed anesthesia with the  patient or authorized representative who has indicated his/her understanding and acceptance.   Dental advisory given  Plan Discussed with: CRNA  Anesthesia Plan Comments:        Anesthesia Quick Evaluation

## 2017-06-08 NOTE — Anesthesia Postprocedure Evaluation (Signed)
Anesthesia Post Note  Patient: Doyne KeelRonald E Subramanian  Procedure(s) Performed: Procedure(s) (LRB): CYSTOSCOPY WITH LEFT RETROGRADE PYELOGRAM, URETEROSCOPY HOLMIUM LASER LITHOTRIPSY BASKET EXTRACTION  AND STENT EXCHANGE (Left) HOLMIUM LASER APPLICATION (Left)     Patient location during evaluation: PACU Anesthesia Type: General Level of consciousness: awake and alert Pain management: pain level controlled Vital Signs Assessment: post-procedure vital signs reviewed and stable Respiratory status: spontaneous breathing, nonlabored ventilation, respiratory function stable and patient connected to nasal cannula oxygen Cardiovascular status: blood pressure returned to baseline and stable Postop Assessment: no signs of nausea or vomiting Anesthetic complications: no    Last Vitals:  Vitals:   06/08/17 1325 06/08/17 1352  BP: 129/81 125/64  Pulse: 86 83  Resp: 16 16  Temp: 36.7 C     Last Pain:  Vitals:   06/08/17 1352  TempSrc:   PainSc: 0-No pain                 Kennieth RadFitzgerald, Domique Reardon E

## 2017-06-08 NOTE — H&P (View-Only) (Signed)
New Consult Note  Requesting Physician: Cathren Laine, MD  Service Requesting Consult: Emergency Medicine  Urology Consult Attending: Mena Goes Reason for Consult:  Left ureteral stone  Subjective: DOYL BITTING is seen in consultation for reasons noted above.   This is a 83 yo patient (severely hard of hearing) with a history of HTN, DM, nephrolithiasis presenting with left sided abdominal and flank pain for 2 days. Similar pain with prior kidney stone passage. Has had associated nausea/vomiting. No fevers. No dysuria. No gross hematuria. He was seen in the ED 5/28 and again today for persistent pain. Ongoing poor PO intake with minimal urine output. Pain not well controlled.   He has had prior kidney stones in the past and has required stone extraction. He is familiar with ureteral stents. He does not see a urologist regularly. 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   Present with daughter.   Past Medical History: Past Medical History:  Diagnosis Date  . Diabetes mellitus without complication (HCC)   . Hypertension   . Renal disorder    kidney stones    Past Surgical History:  Past Surgical History:  Procedure Laterality Date  . CHOLECYSTECTOMY      Medication: Current Facility-Administered Medications  Medication Dose Route Frequency Provider Last Rate Last Dose  . HYDROmorphone (DILAUDID) injection 1 mg  1 mg Intravenous Once Cathren Laine, MD      . promethazine (PHENERGAN) injection 12.5 mg  12.5 mg Intravenous Once Cathren Laine, MD       Current Outpatient Prescriptions  Medication Sig Dispense Refill  . ondansetron (ZOFRAN) 4 MG tablet Take 1 tablet (4 mg total) by mouth every 6 (six) hours. 12 tablet 0  . oxyCODONE-acetaminophen (PERCOCET/ROXICET) 5-325 MG tablet Take 1-2 tablets by mouth every 4 (four) hours as needed for severe pain. 15 tablet 0  . tamsulosin (FLOMAX) 0.4 MG CAPS capsule Take 1 capsule (0.4 mg total) by mouth  daily. 7 capsule 0  . AmLODIPine Besylate (NORVASC PO) Take 1 tablet by mouth daily.    Marland Kitchen aspirin 325 MG tablet Take 325 mg by mouth daily.    Marland Kitchen CARVEDILOL PO Take 1 tablet by mouth daily.    Marland Kitchen loratadine (CLARITIN) 10 MG tablet Take 10 mg by mouth daily.    Marland Kitchen LOSARTAN POTASSIUM PO Take 1 tablet by mouth daily.    . meclizine (ANTIVERT) 25 MG tablet Take 25 mg by mouth daily as needed for dizziness.    . metFORMIN (GLUCOPHAGE) 1000 MG tablet Take 1,000 mg by mouth 2 (two) times daily with a meal.    . simvastatin (ZOCOR) 10 MG tablet Take 5 mg by mouth daily.      Allergies: No Known Allergies  Social History: Social History  Substance Use Topics  . Smoking status: Never Smoker  . Smokeless tobacco: Never Used  . Alcohol use No    Family History No family history on file.  Review of Systems 10 systems were reviewed and are negative except as noted specifically in the HPI.  Objective: Vital signs in last 24 hours: BP 124/71   Pulse (!) 111   Temp 97.4 F (36.3 C) (Oral)   Resp 16   SpO2 97%   Intake/Output last 3 shifts: No intake/output data recorded.  Physical Exam General: NAD, A&O, resting, appropriate HEENT: [place]/AT, EOMI, MMM Pulmonary: Normal work of breathing on RA Cardiovascular: Regular rate Abdomen: soft,  nondistended, left abdominal  pain GU: No foley, left CVA tenderness Extremities: warm and well perfused, no edema Neuro: Appropriate, no focal neurological deficits  Most Recent Labs: Lab Results  Component Value Date   WBC 14.8 (H) 05/09/2017   HGB 12.2 (L) 05/09/2017   HCT 35.8 (L) 05/09/2017   PLT 203 05/09/2017    Lab Results  Component Value Date   NA 133 (L) 05/09/2017   K 4.5 05/09/2017   CL 103 05/09/2017   CO2 21 (L) 05/09/2017   BUN 40 (H) 05/09/2017   CREATININE 2.76 (H) 05/09/2017   CALCIUM 8.3 (L) 05/09/2017    Lab Results  Component Value Date   ALKPHOS 45 05/07/2017   BILITOT 1.1 05/07/2017   PROT 7.2 05/07/2017    ALBUMIN 3.8 05/07/2017   ALT 14 (L) 05/07/2017   AST 26 05/07/2017    No results found for: INR, APTT   IMAGING: Ct Renal Stone Study  Result Date: 05/07/2017 CLINICAL DATA:  Acute right lower quadrant abdominal and flank pain. EXAM: CT ABDOMEN AND PELVIS WITHOUT CONTRAST TECHNIQUE: Multidetector CT imaging of the abdomen and pelvis was performed following the standard protocol without IV contrast. COMPARISON:  None. FINDINGS: Lower chest: No acute abnormality. Hepatobiliary: No focal liver abnormality is seen. No gallstones, gallbladder wall thickening, or biliary dilatation. Pancreas: Unremarkable. No pancreatic ductal dilatation or surrounding inflammatory changes. 16 x 10 mm probable lymph node is seen anterior and superior to pancreatic body. Spleen: Normal in size without focal abnormality. Adrenals/Urinary Tract: Adrenal glands are unremarkable. Two simple cysts arise from upper pole of right kidney, the largest measuring 5 cm. Left nephrolithiasis is noted. Mild left hydronephrosis is noted secondary to 2 calculi in middle portion of left ureter, the largest measuring 7 mm. Urinary bladder is unremarkable. Stomach/Bowel: Stomach is within normal limits. Appendix appears normal. No evidence of bowel wall thickening, distention, or inflammatory changes. Vascular/Lymphatic: 3.3 cm rounded density with calcific rim is noted anterior to the third portion of the duodenum. This is concerning for possible aneurysm. Reproductive: Prostate is unremarkable. Other: Small fat containing periumbilical hernia is noted. No abdominopelvic ascites. Musculoskeletal: Severe multilevel degenerative disc disease is noted in the lumbar spine. No acute abnormality is noted. IMPRESSION: Left nephrolithiasis. Mild left hydronephrosis secondary to 2 calculi in the middle portion of the left ureter, the largest measuring 7 mm. Aortic atherosclerosis. Small fat containing periumbilical hernia. 16 x 10 mm lobulated density is  seen superior and anterior to pancreatic body most consistent with lymph node. Follow-up CT scan in 3-6 months is recommended to ensure stability and rule out malignancy. 3.3 cm rounded density is seen anterior to third portion of the duodenum with calcific rim which may represent calcified diverticulum, but large calcified aneurysm cannot be excluded. CT angiography of the abdomen is recommended for further evaluation. Electronically Signed   By: Lupita RaiderJames  Green Jr, M.D.   On: 05/07/2017 15:18    Assessment: Patient is a 83 y.o. male with HTN, DM, recurrent nephrolithiasis presenting as second visit to ED within 2 days with left flank and abdominal pain secondary to 7mm mid left ureteral stone.   Also has AKI with creatinine of 2.76.  WBC 14.8. Urinalysis pending. No fevers.   Discussed management plan. Given AKI on significant burden in left lower pole, will plan staged stent placement today and then ureteroscopic management in the future. Discussed stent placement, risks, benefits, complications. He is eager to proceed.   Recommendations: 1. Admission to medicine for management of comorbidities and  AKI.  2. OR later today for cysto left ureteral stent placement. Will planned staged ureteroscopic extraction to be scheduled as an outpatient at a date after discharge. 3. Keep NPO until OR with gentle IV fluids.  Discussed with Dr. Mena GoesEskridge. Thank you for this consult. Please do not hesitate to contact us with any further questions/concerns.  Buck MamJason Khaleesi Gruel, MD PGY4 Urology Resident

## 2017-06-09 ENCOUNTER — Encounter (HOSPITAL_COMMUNITY): Payer: Self-pay | Admitting: Urology

## 2017-07-06 ENCOUNTER — Encounter (HOSPITAL_COMMUNITY): Payer: Self-pay | Admitting: Emergency Medicine

## 2017-07-06 ENCOUNTER — Emergency Department (HOSPITAL_COMMUNITY): Payer: Medicare Other

## 2017-07-06 ENCOUNTER — Emergency Department (HOSPITAL_COMMUNITY)
Admission: EM | Admit: 2017-07-06 | Discharge: 2017-07-06 | Disposition: A | Discharge status: Home / Self Care | Payer: Medicare Other | Attending: Emergency Medicine | Admitting: Emergency Medicine

## 2017-07-06 DIAGNOSIS — I4891 Unspecified atrial fibrillation: Secondary | ICD-10-CM | POA: Diagnosis not present

## 2017-07-06 DIAGNOSIS — W109XXA Fall (on) (from) unspecified stairs and steps, initial encounter: Secondary | ICD-10-CM | POA: Diagnosis not present

## 2017-07-06 DIAGNOSIS — Z7901 Long term (current) use of anticoagulants: Secondary | ICD-10-CM | POA: Diagnosis not present

## 2017-07-06 DIAGNOSIS — Z8673 Personal history of transient ischemic attack (TIA), and cerebral infarction without residual deficits: Secondary | ICD-10-CM | POA: Insufficient documentation

## 2017-07-06 DIAGNOSIS — Y939 Activity, unspecified: Secondary | ICD-10-CM | POA: Insufficient documentation

## 2017-07-06 DIAGNOSIS — S61401A Unspecified open wound of right hand, initial encounter: Secondary | ICD-10-CM | POA: Diagnosis not present

## 2017-07-06 DIAGNOSIS — Y999 Unspecified external cause status: Secondary | ICD-10-CM | POA: Insufficient documentation

## 2017-07-06 DIAGNOSIS — I10 Essential (primary) hypertension: Secondary | ICD-10-CM | POA: Diagnosis not present

## 2017-07-06 DIAGNOSIS — Z7984 Long term (current) use of oral hypoglycemic drugs: Secondary | ICD-10-CM | POA: Insufficient documentation

## 2017-07-06 DIAGNOSIS — S42221A 2-part displaced fracture of surgical neck of right humerus, initial encounter for closed fracture: Secondary | ICD-10-CM | POA: Insufficient documentation

## 2017-07-06 DIAGNOSIS — Y929 Unspecified place or not applicable: Secondary | ICD-10-CM | POA: Insufficient documentation

## 2017-07-06 DIAGNOSIS — I252 Old myocardial infarction: Secondary | ICD-10-CM | POA: Insufficient documentation

## 2017-07-06 DIAGNOSIS — E119 Type 2 diabetes mellitus without complications: Secondary | ICD-10-CM | POA: Insufficient documentation

## 2017-07-06 DIAGNOSIS — S42201A Unspecified fracture of upper end of right humerus, initial encounter for closed fracture: Secondary | ICD-10-CM

## 2017-07-06 DIAGNOSIS — S51001A Unspecified open wound of right elbow, initial encounter: Secondary | ICD-10-CM | POA: Diagnosis not present

## 2017-07-06 DIAGNOSIS — M25511 Pain in right shoulder: Secondary | ICD-10-CM | POA: Diagnosis present

## 2017-07-06 MED ORDER — ONDANSETRON HCL 4 MG PO TABS: 4.0000 mg | ORAL_TABLET | Freq: Four times a day (QID) | ORAL | 0 refills | Status: DC | PRN

## 2017-07-06 MED ORDER — MORPHINE SULFATE (PF) 2 MG/ML IV SOLN
4.0000 mg | Freq: Once | INTRAVENOUS | Status: AC
  Administered 2017-07-06: 4 mg via INTRAVENOUS
  Filled 2017-07-06: qty 2

## 2017-07-06 MED ORDER — ONDANSETRON HCL 4 MG/2ML IJ SOLN
4.0000 mg | Freq: Once | INTRAMUSCULAR | Status: AC
  Administered 2017-07-06: 4 mg via INTRAVENOUS
  Filled 2017-07-06: qty 2

## 2017-07-06 MED ORDER — OXYCODONE-ACETAMINOPHEN 5-325 MG PO TABS
1.0000 | ORAL_TABLET | Freq: Once | ORAL | Status: AC
  Administered 2017-07-06: 1 via ORAL
  Filled 2017-07-06: qty 1

## 2017-07-06 MED ORDER — OXYCODONE-ACETAMINOPHEN 5-325 MG PO TABS: 1.0000 | ORAL_TABLET | ORAL | 0 refills | Status: DC | PRN

## 2017-07-06 NOTE — ED Triage Notes (Signed)
Pt BIB EMS from home after a fall. The patient was going down a set of stairs when he felt his legs get weak and when he dropped to the floor and hit on his right shoulder. Patient complaining of pain to the shoulder. Mild deformity and swelling noted. Pain on palpation, no bruising. Patient very guarded when putting on sling. Hx of AFib and + blood thinners. Denies head injury. Good PMS before and after splinting

## 2017-07-06 NOTE — ED Provider Notes (Addendum)
[facility]-EMERGENCY DEPT Provider Note   CSN: [id number] Arrival date & time: 07/06/17  0027     History   Chief Complaint Chief Complaint  Patient presents with  . Fall  . Shoulder Pain    Right    HPI Charles Richard is a 83 y.o. male.  The history is provided by the patient.  He fell going up some steps, and landed on his right shoulder. Is complaining of severe pain there which he rates at 8/10. He also suffered skin tears to his right hand and right elbow. He does not think he hit his head. However, he is anticoagulated on apixaban for atrial fibrillation.  Past Medical History:  Diagnosis Date  . AAA (abdominal aortic aneurysm) without rupture (HCC) 06/03/2017  . Arthritis   . Atrial fibrillation with rapid ventricular response (HCC) 04/2017   New diagnosis during admission for urosepsis secondary to left nephrolithiasis/ureteral stone with hydronephrosis  . Diabetes mellitus without complication (HCC)   . Dysrhythmia    a fib  . Essential hypertension   . Hard of hearing   . History of CVA (cerebrovascular accident) without residual deficits 05/2009   Admitted with expressive aphasia & confusion following a neurocardiogenic syncope event (related to dehydration, AKD & BB related hypotension/bradycardia)  . History of kidney stones   . Hyperlipidemia   . Left nephrolithiasis 04/2017   s/p L Ureteral stent for L hydronephrosis & UTI/Urosepsis.    . Myocardial infarction (HCC)   . Stroke [facility])    mild stroke no residuals    Patient Active Problem List   Diagnosis Date Noted  . AAA (abdominal aortic aneurysm) without rupture (HCC) 06/03/2017  . Preop cardiovascular exam 06/01/2017  . Physical deconditioning   . Ureteral stone with hydronephrosis 05/09/2017  . Essential hypertension 05/09/2017  . Type 2 diabetes mellitus without complication (HCC) 05/09/2017  . Hyponatremia 05/09/2017  . AKI (acute kidney injury) (HCC) 05/09/2017  . Atrial fibrillation with RVR  (HCC) 05/09/2017  . Sepsis secondary to UTI (HCC) 05/09/2017    Past Surgical History:  Procedure Laterality Date  . CHOLECYSTECTOMY    . CYSTOSCOPY W/ URETERAL STENT PLACEMENT Left 05/09/2017   Procedure: CYSTOSCOPY WITH RETROGRADE PYELOGRAM/URETERAL STENT PLACEMENT;  Surgeon: Jerilee Field, MD;  Location: [facility] ORS;  Service: Urology;  Laterality: Left;  . CYSTOSCOPY WITH RETROGRADE PYELOGRAM, URETEROSCOPY AND STENT PLACEMENT Left 06/08/2017   Procedure: CYSTOSCOPY WITH LEFT RETROGRADE PYELOGRAM, URETEROSCOPY HOLMIUM LASER LITHOTRIPSY BASKET EXTRACTION  AND STENT EXCHANGE;  Surgeon: Jerilee Field, MD;  Location: [facility] ORS;  Service: Urology;  Laterality: Left;  . EYE SURGERY     right  . HOLMIUM LASER APPLICATION Left 06/08/2017   Procedure: HOLMIUM LASER APPLICATION;  Surgeon: Jerilee Field, MD;  Location: [facility] OR[facility];  Service: Urology;  Laterality: Left;  . LITHOTRIPSY         Home Medications    Prior to Admission medications   Medication Sig Start Date End Date Taking? Authorizing Provider  amiodarone (PACERONE) 200 MG tablet Take 400 mg ( 2 tablets ) by mouth twice a day for 5 days then take 400 mg ( 2 tablets) by mouth once daily for 5 days then 200 mg daily 06/01/17   Marykay Lex, MD  apixaban (ELIQUIS) 2.5 MG TABS tablet Take 1 tablet (2.5 mg total) by mouth 2 (two) times daily. 05/11/17   Vassie Loll, MD  carvedilol (COREG) 6.25 MG tablet Take 6.25 mg by mouth daily with breakfast.    [provider]  cephALEXin (KEFLEX) 500 MG capsule Take 1 capsule (500 mg total) by mouth at bedtime. 06/08/17   Jerilee FieldEskridge, Matthew, MD  cholecalciferol (VITAMIN D) 1000 units tablet Take 1,000 Units by mouth at bedtime.    [provider]  lisinopril (PRINIVIL,ZESTRIL) 40 MG tablet Take 0.5 tablets (20 mg total) by mouth daily with breakfast. HOLD UNTIL FOLLOW UP WITH PCP 05/11/17   Vassie LollMadera, Carlos, MD  loratadine (CLARITIN) 10 MG tablet Take 10 mg by mouth daily with  breakfast.     [provider]  meclizine (ANTIVERT) 25 MG tablet Take 25 mg by mouth 2 (two) times daily.     [provider]  metFORMIN (GLUCOPHAGE) 1000 MG tablet Take 1,000 mg by mouth 2 (two) times daily with a meal.    [provider]  Multiple Vitamin (MULTIVITAMIN WITH MINERALS) TABS tablet Take 1 tablet by mouth at bedtime.    [provider]  ondansetron (ZOFRAN) 4 MG tablet Take 1 tablet (4 mg total) by mouth every 6 (six) hours. Patient taking differently: Take 4 mg by mouth every 6 (six) hours as needed for nausea.  05/07/17   Raeford RazorKohut, Stephen, MD  Propylene Glycol (SYSTANE BALANCE) 0.6 % SOLN Place 1-2 drops into both eyes daily as needed (for dry eyes).    [provider]  simvastatin (ZOCOR) 20 MG tablet Take 10 mg by mouth daily with breakfast.    [provider]  tamsulosin (FLOMAX) 0.4 MG CAPS capsule Take 1 capsule (0.4 mg total) by mouth daily. 05/07/17   Raeford RazorKohut, Stephen, MD  traMADol (ULTRAM) 50 MG tablet Take 100 mg by mouth every 6 (six) hours as needed for moderate pain.    [provider]    Family History Family History  Problem Relation Age of Onset  . Lung cancer Father        black lung  . CAD Neg Hx   . Kidney Stones Neg Hx   . Aneurysm Neg Hx     Social History Social History  Substance Use Topics  . Smoking status: Never Smoker  . Smokeless tobacco: Never Used  . Alcohol use No     Allergies   Patient has no known allergies.   Review of Systems Review of Systems  All other systems reviewed and are negative.    Physical Exam Updated Vital Signs BP 138/75 (BP Location: Left Arm)   Pulse 75   Temp 98.8 F (37.1 C) (Oral)   Resp 16   SpO2 96%   Physical Exam  Nursing note and vitals reviewed.  83 year old male, resting comfortably and in no acute distress. Vital signs are normal. Oxygen saturation is 96%, which is normal. Head is normocephalic and atraumatic. PERRLA, EOMI.  Oropharynx is clear. Neck is nontender without adenopathy or JVD. Back is nontender and there is no CVA tenderness. Lungs are clear without rales, wheezes, or rhonchi. Chest is nontender. Heart has regular rate and rhythm without murmur. Abdomen is soft, flat, nontender without masses or hepatosplenomegaly and peristalsis is normoactive. Extremities: Swelling and tenderness present over the right shoulder. No tenderness over the clavicle. No deformity seen. Marked pain with passive range of motion. Distal neurovascular exam intact with strong pulses, prompt capillary refill, normal sensation. Skin tear seen on the dorsum of the right hand, and on the right elbow. Skin is warm and dry without rash. Neurologic: Mental status is normal, cranial nerves are intact, there are no motor or sensory deficits.  ED  Treatments / Results   Radiology Dg Shoulder Right  Result Date: 07/06/2017 CLINICAL DATA:  Patient fell, striking the right shoulder. Deformity and swelling is noted. EXAM: RIGHT SHOULDER - 2+ VIEW; RIGHT HUMERUS - 2+ VIEW COMPARISON:  None. FINDINGS: Comminuted fractures of the surgical neck of the right humerus with impaction of fracture fragments, mild medial displacement of the distal fracture fragment, and lateral angulation of the distal fracture fragment. Fracture line extends into the humeral head with small lesser trochanteric fragments suggested. No extension to the articular surface. Coracoclavicular and acromioclavicular spaces are maintained. Distal humerus appears intact. Soft tissue swelling. IMPRESSION: Comminuted fractures of the right humeral head and neck as described. Electronically Signed   By: Burman Nieves M.D.   On: 07/06/2017 01:51   Ct Head Wo Contrast  Result Date: 07/06/2017 CLINICAL DATA:  Patient fell down stairs. No head injury. History of stroke, hypertension, diabetes. EXAM: CT HEAD WITHOUT CONTRAST CT CERVICAL SPINE WITHOUT CONTRAST TECHNIQUE: Multidetector CT  imaging of the head and cervical spine was performed following the standard protocol without intravenous contrast. Multiplanar CT image reconstructions of the cervical spine were also generated. COMPARISON:  MRI brain 05/18/2009.  CT head 05/17/2009. FINDINGS: CT HEAD FINDINGS Brain: Diffuse cerebral atrophy. Low-attenuation changes in the deep white matter consistent small vessel ischemia. No evidence of acute infarction, hemorrhage, hydrocephalus, extra-axial collection or mass lesion/mass effect. Vascular: Tortuous and calcified intracranial arteries. Skull: Normal. Negative for fracture or focal lesion. Sinuses/Orbits: No acute finding. Other: None. CT CERVICAL SPINE FINDINGS Alignment: Normal. Skull base and vertebrae: No acute fracture. No primary bone lesion or focal pathologic process. Soft tissues and spinal canal: No prevertebral fluid or swelling. No visible canal hematoma. Disc levels: Degenerative changes throughout the cervical spine with narrowed interspaces and endplate hypertrophic changes. Degenerative changes are most prominent at C4-5 and C5-6 levels. Degenerative changes throughout the facet joints. Upper chest: Mild emphysematous changes in the lung apices. Other: Vascular calcifications in the cervical carotid arteries. IMPRESSION: 1. No acute intracranial abnormalities. Chronic atrophy and small vessel ischemic changes. 2. No acute displaced cervical spine fractures identified. Degenerative changes are present. 3. Atherosclerotic vascular disease. Electronically Signed   By: Burman Nieves M.D.   On: 07/06/2017 02:04   Ct Cervical Spine Wo Contrast  Result Date: 07/06/2017 CLINICAL DATA:  Patient fell down stairs. No head injury. History of stroke, hypertension, diabetes. EXAM: CT HEAD WITHOUT CONTRAST CT CERVICAL SPINE WITHOUT CONTRAST TECHNIQUE: Multidetector CT imaging of the head and cervical spine was performed following the standard protocol without intravenous contrast.  Multiplanar CT image reconstructions of the cervical spine were also generated. COMPARISON:  MRI brain 05/18/2009.  CT head 05/17/2009. FINDINGS: CT HEAD FINDINGS Brain: Diffuse cerebral atrophy. Low-attenuation changes in the deep white matter consistent small vessel ischemia. No evidence of acute infarction, hemorrhage, hydrocephalus, extra-axial collection or mass lesion/mass effect. Vascular: Tortuous and calcified intracranial arteries. Skull: Normal. Negative for fracture or focal lesion. Sinuses/Orbits: No acute finding. Other: None. CT CERVICAL SPINE FINDINGS Alignment: Normal. Skull base and vertebrae: No acute fracture. No primary bone lesion or focal pathologic process. Soft tissues and spinal canal: No prevertebral fluid or swelling. No visible canal hematoma. Disc levels: Degenerative changes throughout the cervical spine with narrowed interspaces and endplate hypertrophic changes. Degenerative changes are most prominent at C4-5 and C5-6 levels. Degenerative changes throughout the facet joints. Upper chest: Mild emphysematous changes in the lung apices. Other: Vascular calcifications in the cervical carotid arteries. IMPRESSION: 1. No acute intracranial  abnormalities. Chronic atrophy and small vessel ischemic changes. 2. No acute displaced cervical spine fractures identified. Degenerative changes are present. 3. Atherosclerotic vascular disease. Electronically Signed   By: Burman NievesWilliam  Stevens M.D.   On: 07/06/2017 02:04   Dg Humerus Right  Result Date: 07/06/2017 CLINICAL DATA:  Patient fell, striking the right shoulder. Deformity and swelling is noted. EXAM: RIGHT SHOULDER - 2+ VIEW; RIGHT HUMERUS - 2+ VIEW COMPARISON:  None. FINDINGS: Comminuted fractures of the surgical neck of the right humerus with impaction of fracture fragments, mild medial displacement of the distal fracture fragment, and lateral angulation of the distal fracture fragment. Fracture line extends into the humeral head with small  lesser trochanteric fragments suggested. No extension to the articular surface. Coracoclavicular and acromioclavicular spaces are maintained. Distal humerus appears intact. Soft tissue swelling. IMPRESSION: Comminuted fractures of the right humeral head and neck as described. Electronically Signed   By: Burman NievesWilliam  Stevens M.D.   On: 07/06/2017 01:51    Procedures Procedures (including critical care time)  Medications Ordered in ED Medications  morphine 2 MG/ML injection 4 mg (4 mg Intravenous Given 07/06/17 0125)  morphine 2 MG/ML injection 4 mg (4 mg Intravenous Given 07/06/17 0216)  ondansetron (ZOFRAN) injection 4 mg (4 mg Intravenous Given 07/06/17 0302)  oxyCODONE-acetaminophen (PERCOCET/ROXICET) 5-325 MG per tablet 1 tablet (1 tablet Oral Given 07/06/17 0429)     Initial Impression / Assessment and Plan / ED Course  I have reviewed the triage vital signs and the nursing notes.  Pertinent imaging results that were available during my care of the patient were reviewed by me and considered in my medical decision making (see chart for details).  Fall with injury to right shoulder. Old records are reviewed, and he has no prior visits for falls. Recent cardiology evaluation for atrial fibrillation. He is sent for x-rays of his shoulder. Because of anticoagulation history, will also get CT of head and cervical spine.  X-rays show comminuted fracture of the proximal humerus. Head and cervical spine CTs show no acute injury. He is given several doses of morphine, then a dose of oxycodone-acetaminophen, and finally had adequate pain relief. He is placed in a sling and discharged with prescription for oxycodone have acetaminophen. Follow-up with orthopedics.  Final Clinical Impressions(s) / ED Diagnoses   Final diagnoses:  Closed fracture of proximal end of right humerus, unspecified fracture morphology, initial encounter    New Prescriptions New Prescriptions   OXYCODONE-ACETAMINOPHEN  (PERCOCET) 5-325 MG TABLET    Take 1 tablet by mouth every 4 (four) hours as needed for moderate pain.     Dione BoozeGlick, Chayse Gracey, MD 07/06/17 [id number]    Dione BoozeGlick, Naomia Lenderman, MD 07/06/17 [phone]

## 2017-07-06 NOTE — ED Notes (Addendum)
Patient vomited x1, Zofran given. Patient states morphine did not help his pain.

## 2017-07-06 NOTE — ED Notes (Signed)
Bed: [facility] Expected date:  Expected time:  Means of arrival:  Comments: 

## 2017-07-06 NOTE — Discharge Instructions (Signed)
Wear the sling until you see the orthopedic physician.

## 2017-07-13 ENCOUNTER — Ambulatory Visit: Payer: Medicare Other | Admitting: Cardiology

## 2017-07-18 ENCOUNTER — Telehealth: Payer: Self-pay | Admitting: *Deleted

## 2017-07-18 NOTE — Progress Notes (Addendum)
Anesthesia PAT Evaluation: Patient is a 83 year old male scheduled for reverse right shoulder arthroplasty on 07/20/17  By Dr. Duwayne HeckJason Rogers. Dx: Right proximal humerus fracture. (He fell down the stairs on 07/06/16 and sustained comminuted fractures of the right humeral head and neck.) Case was a late add-on and PAT scheduled for 07/19/17. Anesthesia consultation was requested by surgeon. Patient is very hard of hearing. Daughter is with him at PAT. His son will be with him on the day of surgery.    History includes never smoker, HLD, DM2, CVA (acute small posterior circulation CVA; presented with syncope/dysarthria) 05/17/09, HTN, afib 04/2017 (new, during urosepsis admission 05/09/17-05/11/17), AAA, cholecystectomy, hard of hearing. MI was added to his history by an RN on 06/07/17, but I don't see it listed in cardiology notes. His troponin was 0.04-<0.03-<0.03-0.04 during his urosepsis admission with afib. Hospitalized 05/09/17-05/11/17 for left nephrolithiasis with hydronephrosis/urosepsis/AKI s/p cystoscopy with left ureteral stent 05/09/17. He developed afib during that admission and was started on Cardizem, Coreg, and Eliquis with out patient cardiology follow-up. He also had incidental findings of 3.3 cm density possible calcified AAA or calcified diverticulum and a pancreatic body density felt likely a lymph node, but both would need future follow-up. He was later seen by cardiologist Dr. Herbie BaltimoreHarding and is s/p left ureteroscopy with laser lithotripsy, stone basket extraction and left ureteral stent exchange 06/08/17.   - PCP is Dr. Gerlene FeePiva with [facility]. - Urologist is Dr. Jerilee FieldMatthew Eskridge. - Cardiologist is Dr. Bryan Lemmaavid Harding. Last visit 06/01/17 for afib follow-up and preoperative evaluation for urologic surgery (done on 06/08/17). He also signed a note of cardiac clearance for this procedure with permission to hold Eliquis 48 hours prior to surgery.    Meds include amiodarone, Coreg, lisinopril (hold until  PCP f/u), Claritin, meclizine, metformin, Percocet, Zocor, tramadol, Eliquis (last dose 07/17/17).  Vitals: BP 77/48 --> 102/52, HR 99, T 36.6 C, RR 18, O2 sat 97%. CBG 158. BMI 28.22. Exam shows a pleasant Caucasian male in NAD. He is in a hospital wheelchair. His daughter says he needs assistance with mobility. He is very hard of hearing, but able to answer questions. His right arm is in a sling. Lungs sounds clear. Heart rate irregular--although some runs sound regular. HR 99. Mild non-pitting ankle edema. Mallampati II. He has several missing teeth. Denied any loose teeth. He denied chest pain, SOB at rest, syncope. No new edema. He reports issues with constipation, but no current voiding problems.    EKG 06/06/17 ([facility]): Afib with RVR at 111 bpm, low voltage QRS, non-specific ST abnormality.  Echo 05/09/17: Study Conclusions - Left ventricle: The cavity size was normal. There was mild focal   basal hypertrophy of the septum. Systolic function was normal.   The estimated ejection fraction was in the range of 60% to 65%.   Wall motion was normal; there were no regional wall motion   abnormalities. - Aortic root: The aortic root was mildly dilated. Impressions: - Technically difficult; definity used; normal LV systolic   function; mildly dilated aortic root (39 mm).  CT Renal Stone Study 05/07/17: IMPRESSION: - Left nephrolithiasis. Mild left hydronephrosis secondary to 2 calculi in the middle portion of the left ureter, the largest measuring 7 mm. - Aortic atherosclerosis. - Small fat containing periumbilical hernia. - 16 x 10 mm lobulated density is seen superior and anterior to pancreatic body most consistent with lymph node. Follow-up CT scan in 3-6 months is recommended to ensure stability and rule out malignancy. -  3.3 cm rounded density is seen anterior to third portion of the duodenum with calcific rim which may represent calcified diverticulum, but large calcified  aneurysm cannot be excluded. CT angiography of the abdomen is recommended for further evaluation.  Preoperative labs noted. WBC 11.7, H/H 11.1/33.3. PLT 429. Glucose 166. A1c on 06/05/17 was 6.1. BUN 46, Cr 1.85. Cr peaked at  2.76 during admission for urosepsis, but last 1.76 on 06/05/17.    Patient is a pleasant 83 year old male with known afib. Rate has been overall better controlled on amiodarone, although still in the high 90's. He was hypotensive on arrival to PAT, but a little better at 102/52 when rechecked. He denied any acute CV symptoms. Dr. Herbie Baltimore has signed a note of cardiac clearance. He get vitals and his anesthesiologist will further evaluate on the day of surgery to ensure no acute changes prior to proceeding.   Velna Ochs [facility] Short Stay Center/Anesthesiology Phone [phone] 07/19/2017 3:58 PM

## 2017-07-18 NOTE — Telephone Encounter (Signed)
LEFT VOICE MESSAGE ON SECURE LINE---   FAXED CARDIAC CLEARANCE   FOR RIGHT SHOULDER REVISITED TOTAL SHOULDER FOR HUMERUS FRACTURE  PER DR HARDING   CLEARED FOR SURGERY WITH FOLLOWING RECOMMENDATION OF HOLD ELIQUIS FOR 48 HOURS PRE OP --- RESTART POST OP WHEN STABLE-- CONTINUE AMIODARONE.   PLACED FORM TO BE SCAN ANY QUESTION MAY CALL BACK

## 2017-07-19 ENCOUNTER — Encounter (HOSPITAL_COMMUNITY)
Admission: RE | Admit: 2017-07-19 | Discharge: 2017-07-19 | Disposition: A | Discharge status: Home / Self Care | Payer: Medicare Other | Source: Ambulatory Visit | Attending: Orthopedic Surgery | Admitting: Orthopedic Surgery

## 2017-07-19 ENCOUNTER — Encounter (HOSPITAL_COMMUNITY): Payer: Self-pay

## 2017-07-19 LAB — BASIC METABOLIC PANEL
ANION GAP: 12 (ref 5–15)
BUN: 46 mg/dL — ABNORMAL HIGH (ref 6–20)
CO2: 21 mmol/L — ABNORMAL LOW (ref 22–32)
Calcium: 9 mg/dL (ref 8.9–10.3)
Chloride: 103 mmol/L (ref 101–111)
Creatinine, Ser: 1.85 mg/dL — ABNORMAL HIGH (ref 0.61–1.24)
GFR, EST AFRICAN AMERICAN: 37 mL/min — AB (ref >60)
GFR, EST NON AFRICAN AMERICAN: 32 mL/min — AB (ref >60)
GLUCOSE: 166 mg/dL — AB (ref 65–99)
POTASSIUM: 4.8 mmol/L (ref 3.5–5.1)
Sodium: 136 mmol/L (ref 135–145)

## 2017-07-19 LAB — CBC
HEMATOCRIT: 33.3 % — AB (ref 39.0–52.0)
Hemoglobin: 11.1 g/dL — ABNORMAL LOW (ref 13.0–17.0)
MCH: 31.4 pg (ref 26.0–34.0)
MCHC: 33.3 g/dL (ref 30.0–36.0)
MCV: 94.3 fL (ref 78.0–100.0)
PLATELETS: 429 10*3/uL — AB (ref 150–400)
RBC: 3.53 MIL/uL — AB (ref 4.22–5.81)
RDW: 14.3 % (ref 11.5–15.5)
WBC: 11.7 10*3/uL — AB (ref 4.0–10.5)

## 2017-07-19 LAB — GLUCOSE, CAPILLARY: GLUCOSE-CAPILLARY: 158 mg/dL — AB (ref 65–99)

## 2017-07-19 LAB — SURGICAL PCR SCREEN
MRSA, PCR: NEGATIVE
Staphylococcus aureus: NEGATIVE

## 2017-07-19 NOTE — Progress Notes (Signed)
Patient's daughter stated his last dose of ELIQUIS was 07/17/17.

## 2017-07-19 NOTE — Pre-Procedure Instructions (Signed)
Charles Richard  07/19/2017      [facility] - [place] - [address] [address] [place] [address] Phone: [phone] Fax: [phone]    Your procedure is scheduled on  Friday  07/20/17  Report to [facility] Admitting at 1215 P.M.  Call this number if you have problems the morning of surgery:  [phone]   Remember:  Do not eat food or drink liquids after midnight.  Take these medicines the morning of surgery with A SIP OF WATER   AMIODARONE (PACERONE), CARVEDILOL(COREG), MECLIZINE, OXYCODONE OR TRAMADOL  IF NEEDED, EYE DROPS   7 days prior to surgery STOP taking any Aspirin, Aleve, Naproxen, Ibuprofen, Motrin, Advil, Goody's, BC's, all herbal medications, fish oil, and all vitamins    (STOP ELIQUIS 48 HOURS PRIOR TO SURGERY)    How to Manage Your Diabetes Before and After Surgery  Why is it important to control my blood sugar before and after surgery? . Improving blood sugar levels before and after surgery helps healing and can limit problems. . A way of improving blood sugar control is eating a healthy diet by: o  Eating less sugar and carbohydrates o  Increasing activity/exercise o  Talking with your doctor about reaching your blood sugar goals . High blood sugars (greater than 180 mg/dL) can raise your risk of infections and slow your recovery, so you will need to focus on controlling your diabetes during the weeks before surgery. . Make sure that the doctor who takes care of your diabetes knows about your planned surgery including the date and location.  How do I manage my blood sugar before surgery? . Check your blood sugar at least 4 times a day, starting 2 days before surgery, to make sure that the level is not too high or low. o Check your blood sugar the morning of your surgery when you wake up and every 2 hours until you get to the Short Stay unit. . If your blood sugar is less than 70  mg/dL, you will need to treat for low blood sugar: o Do not take insulin. o Treat a low blood sugar (less than 70 mg/dL) with  cup of clear juice (cranberry or apple), 4 glucose tablets, OR glucose gel. o Recheck blood sugar in 15 minutes after treatment (to make sure it is greater than 70 mg/dL). If your blood sugar is not greater than 70 mg/dL on recheck, call [phone] for further instructions. . Report your blood sugar to the short stay nurse when you get to Short Stay.  . If you are admitted to the hospital after surgery: o Your blood sugar will be checked by the staff and you will probably be given insulin after surgery (instead of oral diabetes medicines) to make sure you have good blood sugar levels. o The goal for blood sugar control after surgery is 80-180 mg/dL.              WHAT DO I DO ABOUT MY DIABETES MEDICATION?   Marland Kitchen Do not take oral diabetes medicines (pills) the morning of surgery.  .       .   .   .   Other Instructions:              Reviewed and Endorsed by [facility] Patient Education Committee, August 2015  Do not wear jewelry, make-up or nail polish.  Do not wear lotions, powders, or perfumes, or  deoderant.  Do not shave 48 hours prior to surgery.  Men may shave face and neck.  Do not bring valuables to the hospital.  [facility] is not responsible for any belongings or valuables.  Contacts, dentures or bridgework may not be worn into surgery.  Leave your suitcase in the car.  After surgery it may be brought to your room.  For patients admitted to the hospital, discharge time will be determined by your treatment team.  Patients discharged the day of surgery will not be allowed to drive home.   Name and phone number of your driver:    Special instructions:  [place] - Preparing for Surgery  Before surgery, you can play an important role.  Because skin is not sterile, your skin needs to be as free of germs as possible.  You can  reduce the number of germs on you skin by washing with CHG (chlorahexidine gluconate) soap before surgery.  CHG is an antiseptic cleaner which kills germs and bonds with the skin to continue killing germs even after washing.  Please DO NOT use if you have an allergy to CHG or antibacterial soaps.  If your skin becomes reddened/irritated stop using the CHG and inform your nurse when you arrive at Short Stay.  Do not shave (including legs and underarms) for at least 48 hours prior to the first CHG shower.  You may shave your face.  Please follow these instructions carefully:   1.  Shower with CHG Soap the night before surgery and the                                morning of Surgery.  2.  If you choose to wash your hair, wash your hair first as usual with your       normal shampoo.  3.  After you shampoo, rinse your hair and body thoroughly to remove the                      Shampoo.  4.  Use CHG as you would any other liquid soap.  You can apply chg directly       to the skin and wash gently with scrungie or a clean washcloth.  5.  Apply the CHG Soap to your body ONLY FROM THE NECK DOWN.        Do not use on open wounds or open sores.  Avoid contact with your eyes,       ears, mouth and genitals (private parts).  Wash genitals (private parts)       with your normal soap.  6.  Wash thoroughly, paying special attention to the area where your surgery        will be performed.  7.  Thoroughly rinse your body with warm water from the neck down.  8.  DO NOT shower/wash with your normal soap after using and rinsing off       the CHG Soap.  9.  Pat yourself dry with a clean towel.            10.  Wear clean pajamas.            11.  Place clean sheets on your bed the night of your first shower and do not        sleep with pets.  Day of Surgery  Do not apply any lotions/deoderants the morning of surgery.  Please wear clean clothes to the hospital/surgery center.    Please read over the following fact  sheets that you were given. Pain Booklet, Coughing and Deep Breathing, MRSA Information and Surgical Site Infection Prevention

## 2017-07-20 ENCOUNTER — Encounter (HOSPITAL_COMMUNITY): Payer: Self-pay | Admitting: Certified Registered Nurse Anesthetist

## 2017-07-20 ENCOUNTER — Inpatient Hospital Stay (HOSPITAL_COMMUNITY)
Admission: RE | Admit: 2017-07-20 | Discharge: 2017-07-22 | DRG: 483 | Disposition: A | Discharge status: Home / Self Care | Payer: Medicare Other | Source: Ambulatory Visit | Attending: Orthopedic Surgery | Admitting: Orthopedic Surgery

## 2017-07-20 ENCOUNTER — Encounter (HOSPITAL_COMMUNITY)
Admission: RE | Disposition: A | Discharge status: Home / Self Care | Payer: Self-pay | Source: Ambulatory Visit | Attending: Orthopedic Surgery

## 2017-07-20 ENCOUNTER — Inpatient Hospital Stay (HOSPITAL_COMMUNITY): Payer: Medicare Other

## 2017-07-20 ENCOUNTER — Inpatient Hospital Stay (HOSPITAL_COMMUNITY): Payer: Medicare Other | Admitting: Vascular Surgery

## 2017-07-20 DIAGNOSIS — I252 Old myocardial infarction: Secondary | ICD-10-CM | POA: Diagnosis not present

## 2017-07-20 DIAGNOSIS — D62 Acute posthemorrhagic anemia: Secondary | ICD-10-CM | POA: Diagnosis not present

## 2017-07-20 DIAGNOSIS — W109XXA Fall (on) (from) unspecified stairs and steps, initial encounter: Secondary | ICD-10-CM | POA: Diagnosis present

## 2017-07-20 DIAGNOSIS — I4891 Unspecified atrial fibrillation: Secondary | ICD-10-CM | POA: Diagnosis present

## 2017-07-20 DIAGNOSIS — Z96611 Presence of right artificial shoulder joint: Secondary | ICD-10-CM

## 2017-07-20 DIAGNOSIS — M1990 Unspecified osteoarthritis, unspecified site: Secondary | ICD-10-CM | POA: Diagnosis present

## 2017-07-20 DIAGNOSIS — S42201A Unspecified fracture of upper end of right humerus, initial encounter for closed fracture: Secondary | ICD-10-CM | POA: Diagnosis present

## 2017-07-20 DIAGNOSIS — H9190 Unspecified hearing loss, unspecified ear: Secondary | ICD-10-CM | POA: Diagnosis present

## 2017-07-20 DIAGNOSIS — Z87442 Personal history of urinary calculi: Secondary | ICD-10-CM | POA: Diagnosis not present

## 2017-07-20 DIAGNOSIS — Z7984 Long term (current) use of oral hypoglycemic drugs: Secondary | ICD-10-CM | POA: Diagnosis not present

## 2017-07-20 DIAGNOSIS — I959 Hypotension, unspecified: Secondary | ICD-10-CM | POA: Diagnosis present

## 2017-07-20 DIAGNOSIS — I10 Essential (primary) hypertension: Secondary | ICD-10-CM | POA: Diagnosis present

## 2017-07-20 DIAGNOSIS — Z7901 Long term (current) use of anticoagulants: Secondary | ICD-10-CM | POA: Diagnosis not present

## 2017-07-20 DIAGNOSIS — Z79899 Other long term (current) drug therapy: Secondary | ICD-10-CM

## 2017-07-20 DIAGNOSIS — Z8673 Personal history of transient ischemic attack (TIA), and cerebral infarction without residual deficits: Secondary | ICD-10-CM | POA: Diagnosis not present

## 2017-07-20 DIAGNOSIS — E1151 Type 2 diabetes mellitus with diabetic peripheral angiopathy without gangrene: Secondary | ICD-10-CM | POA: Diagnosis present

## 2017-07-20 DIAGNOSIS — S42291A Other displaced fracture of upper end of right humerus, initial encounter for closed fracture: Secondary | ICD-10-CM | POA: Diagnosis present

## 2017-07-20 HISTORY — PX: REVERSE SHOULDER ARTHROPLASTY: SHX5054

## 2017-07-20 LAB — GLUCOSE, CAPILLARY
GLUCOSE-CAPILLARY: 110 mg/dL — AB (ref 65–99)
Glucose-Capillary: 150 mg/dL — ABNORMAL HIGH (ref 65–99)
Glucose-Capillary: 130 mg/dL — ABNORMAL HIGH (ref 65–99)

## 2017-07-20 LAB — PROTIME-INR
INR: 1.14
PROTHROMBIN TIME: 14.7 s (ref 11.4–15.2)

## 2017-07-20 SURGERY — ARTHROPLASTY, SHOULDER, TOTAL, REVERSE
Anesthesia: General | Site: Shoulder | Laterality: Right

## 2017-07-20 MED ORDER — SODIUM CHLORIDE 0.9 % IR SOLN
Status: DC | PRN
  Administered 2017-07-20: 1000 mL

## 2017-07-20 MED ORDER — FENTANYL CITRATE (PF) 100 MCG/2ML IJ SOLN
50.0000 ug | Freq: Once | INTRAMUSCULAR | Status: AC
  Administered 2017-07-20: 50 ug via INTRAVENOUS

## 2017-07-20 MED ORDER — FENTANYL CITRATE (PF) 250 MCG/5ML IJ SOLN
INTRAMUSCULAR | Status: AC
  Filled 2017-07-20: qty 5

## 2017-07-20 MED ORDER — AMIODARONE HCL 100 MG PO TABS
200.0000 mg | ORAL_TABLET | Freq: Every day | ORAL | Status: DC
  Administered 2017-07-21 – 2017-07-22 (×2): 200 mg via ORAL
  Filled 2017-07-20 (×2): qty 2

## 2017-07-20 MED ORDER — FENTANYL CITRATE (PF) 100 MCG/2ML IJ SOLN
INTRAMUSCULAR | Status: AC
  Administered 2017-07-20: 50 ug via INTRAVENOUS
  Filled 2017-07-20: qty 2

## 2017-07-20 MED ORDER — ONDANSETRON HCL 4 MG PO TABS: 4.0000 mg | ORAL_TABLET | Freq: Four times a day (QID) | ORAL | Status: DC | PRN

## 2017-07-20 MED ORDER — ONDANSETRON HCL 4 MG/2ML IJ SOLN
INTRAMUSCULAR | Status: DC | PRN
Start: 2017-07-20 — End: 2017-07-20
  Administered 2017-07-20: 4 mg via INTRAVENOUS

## 2017-07-20 MED ORDER — SIMVASTATIN 10 MG PO TABS
10.0000 mg | ORAL_TABLET | Freq: Every day | ORAL | Status: DC
  Administered 2017-07-21 – 2017-07-22 (×2): 10 mg via ORAL
  Filled 2017-07-20 (×2): qty 1

## 2017-07-20 MED ORDER — PHENYLEPHRINE HCL 10 MG/ML IJ SOLN
INTRAVENOUS | Status: DC | PRN
  Administered 2017-07-20: 40 ug/min via INTRAVENOUS

## 2017-07-20 MED ORDER — ONDANSETRON HCL 4 MG/2ML IJ SOLN
INTRAMUSCULAR | Status: AC
  Filled 2017-07-20: qty 2

## 2017-07-20 MED ORDER — LACTATED RINGERS IV SOLN
INTRAVENOUS | Status: DC | PRN
  Administered 2017-07-20: 14:00:00 via INTRAVENOUS

## 2017-07-20 MED ORDER — ROCURONIUM BROMIDE 10 MG/ML (PF) SYRINGE
PREFILLED_SYRINGE | INTRAVENOUS | Status: DC | PRN
  Administered 2017-07-20: 50 mg via INTRAVENOUS
  Administered 2017-07-20: 20 mg via INTRAVENOUS
  Administered 2017-07-20: 10 mg via INTRAVENOUS

## 2017-07-20 MED ORDER — OXYCODONE HCL 5 MG PO TABS: 5.0000 mg | ORAL_TABLET | ORAL | 0 refills | Status: DC | PRN

## 2017-07-20 MED ORDER — VITAMIN D 1000 UNITS PO TABS
1000.0000 [IU] | ORAL_TABLET | Freq: Every day | ORAL | Status: DC
  Administered 2017-07-20 – 2017-07-21 (×2): 1000 [IU] via ORAL
  Filled 2017-07-20 (×2): qty 1

## 2017-07-20 MED ORDER — TRAMADOL HCL 50 MG PO TABS
50.0000 mg | ORAL_TABLET | Freq: Four times a day (QID) | ORAL | Status: DC | PRN
  Administered 2017-07-21 – 2017-07-22 (×2): 50 mg via ORAL
  Filled 2017-07-20 (×2): qty 1

## 2017-07-20 MED ORDER — ONDANSETRON 4 MG PO TBDP: 4.0000 mg | ORAL_TABLET | Freq: Three times a day (TID) | ORAL | 0 refills | Status: DC | PRN

## 2017-07-20 MED ORDER — PROPOFOL 10 MG/ML IV BOLUS
INTRAVENOUS | Status: AC
  Filled 2017-07-20: qty 20

## 2017-07-20 MED ORDER — EPHEDRINE 5 MG/ML INJ
INTRAVENOUS | Status: AC
  Filled 2017-07-20: qty 10

## 2017-07-20 MED ORDER — LIDOCAINE 2% (20 MG/ML) 5 ML SYRINGE
INTRAMUSCULAR | Status: AC
  Filled 2017-07-20: qty 5

## 2017-07-20 MED ORDER — ROPIVACAINE HCL 5 MG/ML IJ SOLN
INTRAMUSCULAR | Status: DC | PRN
  Administered 2017-07-20: 30 mL via PERINEURAL

## 2017-07-20 MED ORDER — ADULT MULTIVITAMIN W/MINERALS CH
1.0000 | ORAL_TABLET | Freq: Every day | ORAL | Status: DC
  Administered 2017-07-20 – 2017-07-21 (×2): 1 via ORAL
  Filled 2017-07-20 (×2): qty 1

## 2017-07-20 MED ORDER — METHOCARBAMOL 500 MG PO TABS
500.0000 mg | ORAL_TABLET | Freq: Four times a day (QID) | ORAL | Status: DC | PRN
  Administered 2017-07-21 – 2017-07-22 (×3): 500 mg via ORAL
  Filled 2017-07-20 (×3): qty 1

## 2017-07-20 MED ORDER — SUGAMMADEX SODIUM 200 MG/2ML IV SOLN
INTRAVENOUS | Status: AC
  Filled 2017-07-20: qty 2

## 2017-07-20 MED ORDER — PROMETHAZINE HCL 25 MG/ML IJ SOLN: 6.2500 mg | INTRAMUSCULAR | Status: DC | PRN

## 2017-07-20 MED ORDER — LISINOPRIL 20 MG PO TABS
20.0000 mg | ORAL_TABLET | Freq: Every day | ORAL | Status: DC
  Administered 2017-07-21 – 2017-07-22 (×2): 20 mg via ORAL
  Filled 2017-07-20 (×2): qty 1

## 2017-07-20 MED ORDER — LORATADINE 10 MG PO TABS
10.0000 mg | ORAL_TABLET | Freq: Every day | ORAL | Status: DC
  Administered 2017-07-21 – 2017-07-22 (×2): 10 mg via ORAL
  Filled 2017-07-20 (×2): qty 1

## 2017-07-20 MED ORDER — BUPIVACAINE-EPINEPHRINE (PF) 0.25% -1:200000 IJ SOLN
INTRAMUSCULAR | Status: AC
  Filled 2017-07-20: qty 30

## 2017-07-20 MED ORDER — INSULIN ASPART 100 UNIT/ML ~~LOC~~ SOLN: 0.0000 [IU] | Freq: Every day | SUBCUTANEOUS | Status: DC

## 2017-07-20 MED ORDER — OXYCODONE HCL 5 MG/5ML PO SOLN: 5.0000 mg | Freq: Once | ORAL | Status: DC | PRN

## 2017-07-20 MED ORDER — LIDOCAINE 2% (20 MG/ML) 5 ML SYRINGE
INTRAMUSCULAR | Status: DC | PRN
  Administered 2017-07-20: 60 mg via INTRAVENOUS

## 2017-07-20 MED ORDER — PROPOFOL 10 MG/ML IV BOLUS
INTRAVENOUS | Status: DC | PRN
  Administered 2017-07-20: 80 mg via INTRAVENOUS

## 2017-07-20 MED ORDER — MORPHINE SULFATE (PF) 4 MG/ML IV SOLN: 2.0000 mg | INTRAVENOUS | Status: DC | PRN

## 2017-07-20 MED ORDER — MIDAZOLAM HCL 2 MG/2ML IJ SOLN
1.0000 mg | Freq: Once | INTRAMUSCULAR | Status: AC
  Administered 2017-07-20: 1 mg via INTRAVENOUS

## 2017-07-20 MED ORDER — CEFAZOLIN SODIUM-DEXTROSE 2-4 GM/100ML-% IV SOLN
2.0000 g | INTRAVENOUS | Status: AC
  Administered 2017-07-20: 2 g via INTRAVENOUS

## 2017-07-20 MED ORDER — OXYCODONE HCL 5 MG PO TABS: 5.0000 mg | ORAL_TABLET | Freq: Once | ORAL | Status: DC | PRN

## 2017-07-20 MED ORDER — ETOMIDATE 2 MG/ML IV SOLN
INTRAVENOUS | Status: AC
  Filled 2017-07-20: qty 10

## 2017-07-20 MED ORDER — MECLIZINE HCL 25 MG PO TABS
25.0000 mg | ORAL_TABLET | Freq: Two times a day (BID) | ORAL | Status: DC
  Administered 2017-07-20 – 2017-07-22 (×3): 25 mg via ORAL
  Filled 2017-07-20 (×4): qty 1

## 2017-07-20 MED ORDER — ACETAMINOPHEN 325 MG PO TABS
650.0000 mg | ORAL_TABLET | Freq: Four times a day (QID) | ORAL | Status: DC | PRN
  Administered 2017-07-21: 650 mg via ORAL
  Filled 2017-07-20: qty 2

## 2017-07-20 MED ORDER — INSULIN ASPART 100 UNIT/ML ~~LOC~~ SOLN
0.0000 [IU] | Freq: Three times a day (TID) | SUBCUTANEOUS | Status: DC
  Administered 2017-07-21 (×2): 3 [IU] via SUBCUTANEOUS

## 2017-07-20 MED ORDER — MIDAZOLAM HCL 2 MG/2ML IJ SOLN
INTRAMUSCULAR | Status: AC
  Filled 2017-07-20: qty 2

## 2017-07-20 MED ORDER — CEFAZOLIN SODIUM-DEXTROSE 2-4 GM/100ML-% IV SOLN
INTRAVENOUS | Status: AC
  Filled 2017-07-20: qty 100

## 2017-07-20 MED ORDER — DOCUSATE SODIUM 100 MG PO CAPS
200.0000 mg | ORAL_CAPSULE | Freq: Every day | ORAL | Status: DC
  Administered 2017-07-20 – 2017-07-22 (×3): 200 mg via ORAL
  Filled 2017-07-20 (×3): qty 2

## 2017-07-20 MED ORDER — ACETAMINOPHEN 650 MG RE SUPP: 650.0000 mg | Freq: Four times a day (QID) | RECTAL | Status: DC | PRN

## 2017-07-20 MED ORDER — APIXABAN 2.5 MG PO TABS
2.5000 mg | ORAL_TABLET | Freq: Two times a day (BID) | ORAL | Status: DC
  Administered 2017-07-21 – 2017-07-22 (×3): 2.5 mg via ORAL
  Filled 2017-07-20 (×3): qty 1

## 2017-07-20 MED ORDER — CHLORHEXIDINE GLUCONATE 4 % EX LIQD: 60.0000 mL | Freq: Once | CUTANEOUS | Status: DC

## 2017-07-20 MED ORDER — PHENYLEPHRINE HCL 10 MG/ML IJ SOLN
INTRAMUSCULAR | Status: DC | PRN
  Administered 2017-07-20: 200 ug via INTRAVENOUS

## 2017-07-20 MED ORDER — DEXTROSE 5 % IV SOLN
500.0000 mg | Freq: Four times a day (QID) | INTRAVENOUS | Status: DC | PRN
  Filled 2017-07-20: qty 5

## 2017-07-20 MED ORDER — MIDAZOLAM HCL 2 MG/2ML IJ SOLN
INTRAMUSCULAR | Status: AC
  Administered 2017-07-20: 1 mg via INTRAVENOUS
  Filled 2017-07-20: qty 2

## 2017-07-20 MED ORDER — CARVEDILOL 6.25 MG PO TABS
6.2500 mg | ORAL_TABLET | Freq: Every day | ORAL | Status: DC
  Administered 2017-07-21 – 2017-07-22 (×2): 6.25 mg via ORAL
  Filled 2017-07-20 (×3): qty 1

## 2017-07-20 MED ORDER — OXYCODONE HCL 5 MG PO TABS
5.0000 mg | ORAL_TABLET | ORAL | Status: DC | PRN
  Administered 2017-07-21 – 2017-07-22 (×4): 10 mg via ORAL
  Filled 2017-07-20 (×4): qty 2

## 2017-07-20 MED ORDER — SODIUM CHLORIDE 0.9 % IV SOLN
INTRAVENOUS | Status: DC
  Administered 2017-07-20: 10 mL/h via INTRAVENOUS
  Administered 2017-07-21: 10:00:00 via INTRAVENOUS

## 2017-07-20 MED ORDER — SUGAMMADEX SODIUM 200 MG/2ML IV SOLN
INTRAVENOUS | Status: DC | PRN
  Administered 2017-07-20: 200 mg via INTRAVENOUS

## 2017-07-20 MED ORDER — ALBUMIN HUMAN 5 % IV SOLN
INTRAVENOUS | Status: DC | PRN
  Administered 2017-07-20 (×2): via INTRAVENOUS

## 2017-07-20 MED ORDER — HYDROMORPHONE HCL 1 MG/ML IJ SOLN: 0.2500 mg | INTRAMUSCULAR | Status: DC | PRN

## 2017-07-20 SURGICAL SUPPLY — 62 items
BIT DRILL 5/64X5 DISP (BIT) ×3 IMPLANT
BIT DRILL TWIST 2.7 (BIT) IMPLANT
BIT DRILL TWIST 2.7MM (BIT)
BLADE SAG 18X100X1.27 (BLADE) ×3 IMPLANT
BONE CEMENT PALACOSE (Cement) ×6 IMPLANT
CAPT SHLDR REVTOTAL 2 ×2 IMPLANT
CAPT SHOULDER REVTOTAL 2 ×1 IMPLANT
CEMENT BONE PALACOSE (Cement) ×2 IMPLANT
CLOSURE WOUND 1/2 X4 (GAUZE/BANDAGES/DRESSINGS) ×1
COVER SURGICAL LIGHT HANDLE (MISCELLANEOUS) ×3 IMPLANT
DRAPE IMP U-DRAPE 54X76 (DRAPES) ×6 IMPLANT
DRAPE INCISE IOBAN 66X45 STRL (DRAPES) ×6 IMPLANT
DRAPE ORTHO SPLIT 77X108 STRL (DRAPES) ×4
DRAPE SURG ORHT 6 SPLT 77X108 (DRAPES) ×2 IMPLANT
DRAPE U-SHAPE 47X51 STRL (DRAPES) ×3 IMPLANT
DRSG ADAPTIC 3X8 NADH LF (GAUZE/BANDAGES/DRESSINGS) ×3 IMPLANT
DRSG AQUACEL AG ADV 3.5X10 (GAUZE/BANDAGES/DRESSINGS) ×3 IMPLANT
DRSG PAD ABDOMINAL 8X10 ST (GAUZE/BANDAGES/DRESSINGS) ×3 IMPLANT
DURAPREP 26ML APPLICATOR (WOUND CARE) ×3 IMPLANT
ELECT BLADE 4.0 EZ CLEAN MEGAD (MISCELLANEOUS) ×3
ELECT REM PT RETURN 9FT ADLT (ELECTROSURGICAL) ×3
ELECTRODE BLDE 4.0 EZ CLN MEGD (MISCELLANEOUS) ×1 IMPLANT
ELECTRODE REM PT RTRN 9FT ADLT (ELECTROSURGICAL) ×1 IMPLANT
FRACTURE POSITIONING SLEEVE ×3 IMPLANT
GAUZE SPONGE 4X4 12PLY STRL (GAUZE/BANDAGES/DRESSINGS) IMPLANT
GLOVE BIO SURGEON STRL SZ7.5 (GLOVE) ×3 IMPLANT
GLOVE BIOGEL PI IND STRL 8 (GLOVE) ×1 IMPLANT
GLOVE BIOGEL PI INDICATOR 8 (GLOVE) ×2
GOWN STRL REUS W/ TWL LRG LVL3 (GOWN DISPOSABLE) ×1 IMPLANT
GOWN STRL REUS W/ TWL XL LVL3 (GOWN DISPOSABLE) ×2 IMPLANT
GOWN STRL REUS W/TWL LRG LVL3 (GOWN DISPOSABLE) ×2
GOWN STRL REUS W/TWL XL LVL3 (GOWN DISPOSABLE) ×4
KIT BASIN OR (CUSTOM PROCEDURE TRAY) ×3 IMPLANT
KIT BEACH CHAIR TRIMANO (MISCELLANEOUS) IMPLANT
KIT ROOM TURNOVER OR (KITS) ×3 IMPLANT
MANIFOLD NEPTUNE II (INSTRUMENTS) ×3 IMPLANT
NEEDLE 1/2 CIR MAYO (NEEDLE) ×3 IMPLANT
NEEDLE HYPO 25GX1X1/2 BEV (NEEDLE) ×3 IMPLANT
NS IRRIG 1000ML POUR BTL (IV SOLUTION) ×3 IMPLANT
PACK SHOULDER (CUSTOM PROCEDURE TRAY) ×3 IMPLANT
PAD ARMBOARD 7.5X6 YLW CONV (MISCELLANEOUS) ×6 IMPLANT
PIN THREADED REVERSE (PIN) IMPLANT
SHOULDER CAPITATED REVTOTAL 2 ×1 IMPLANT
SLING ARM FOAM STRAP LRG (SOFTGOODS) IMPLANT
SLING ARM FOAM STRAP MED (SOFTGOODS) IMPLANT
SPONGE LAP 18X18 X RAY DECT (DISPOSABLE) IMPLANT
SPONGE LAP 4X18 X RAY DECT (DISPOSABLE) ×3 IMPLANT
STEM HUMERAL STRL 10MMX122MM (Stem) ×3 IMPLANT
STRIP CLOSURE SKIN 1/2X4 (GAUZE/BANDAGES/DRESSINGS) ×2 IMPLANT
SUCTION FRAZIER HANDLE 10FR (MISCELLANEOUS) ×2
SUCTION TUBE FRAZIER 10FR DISP (MISCELLANEOUS) ×1 IMPLANT
SUT FIBERWIRE #2 38 T-5 BLUE (SUTURE) ×6
SUT MNCRL AB 4-0 PS2 18 (SUTURE) ×3 IMPLANT
SUT VIC AB 2-0 CT1 27 (SUTURE) ×2
SUT VIC AB 2-0 CT1 TAPERPNT 27 (SUTURE) ×1 IMPLANT
SUTURE FIBERWR #2 38 T-5 BLUE (SUTURE) ×2 IMPLANT
SYR CONTROL 10ML LL (SYRINGE) ×3 IMPLANT
TOWEL OR 17X24 6PK STRL BLUE (TOWEL DISPOSABLE) IMPLANT
TOWEL OR 17X26 10 PK STRL BLUE (TOWEL DISPOSABLE) ×3 IMPLANT
TOWER CARTRIDGE SMART MIX (DISPOSABLE) ×6 IMPLANT
WATER STERILE IRR 1000ML POUR (IV SOLUTION) IMPLANT
YANKAUER SUCT BULB TIP NO VENT (SUCTIONS) ×3 IMPLANT

## 2017-07-20 NOTE — Brief Op Note (Signed)
07/20/2017  5:26 PM  PATIENT:  Doyne Keelonald E Garling  83 y.o. male  PRE-OPERATIVE DIAGNOSIS:  Right proximal humerus fracture  POST-OPERATIVE DIAGNOSIS:  Right proximal humerus fracture   PROCEDURE:  Procedure(s) with comments: REVERSE RIGHT SHOULDER ARTHROPLASTY (Right) - 150 mins  SURGEON:  Surgeon(s) and Role:    * Yolonda Kidaogers, Jason Patrick, MD - Primary  PHYSICIAN ASSISTANT:   ASSISTANTS: April Green, RNFA   ANESTHESIA:   regional and general  EBL:  Total I/O In: 1250 [I.V.:1000; IV Piggyback:250] Out: 350 [Blood:350]  BLOOD ADMINISTERED:none  DRAINS: none   LOCAL MEDICATIONS USED:  NONE  SPECIMEN:  No Specimen  DISPOSITION OF SPECIMEN:  N/A  COUNTS:  YES  TOURNIQUET:  * No tourniquets in log *  DICTATION: .Note written in EPIC  PLAN OF CARE: Admit to inpatient   PATIENT DISPOSITION:  PACU - hemodynamically stable.   Delay start of Pharmacological VTE agent (>24hrs) due to surgical blood loss or risk of bleeding: not applicable

## 2017-07-20 NOTE — Anesthesia Procedure Notes (Signed)
Anesthesia Regional Block: Interscalene brachial plexus block   Pre-Anesthetic Checklist: ,, timeout performed, Correct Patient, Correct Site, Correct Laterality, Correct Procedure, Correct Position, site marked, Risks and benefits discussed,  Surgical consent,  Pre-op evaluation,  At surgeon's request and post-op pain management  Laterality: Right  Prep: chloraprep       Needles:  Injection technique: Single-shot  Needle Type: Stimiplex     Needle Length: 9cm  Needle Gauge: 21     Additional Needles:   Procedures: ultrasound guided,,,,,,,,  Narrative:  Start time: 07/20/2017 1:36 PM End time: 07/20/2017 1:41 PM  Performed by: Personally  Anesthesiologist: Anitra LauthMILLER, Chanon Loney RAY

## 2017-07-20 NOTE — Anesthesia Postprocedure Evaluation (Signed)
Anesthesia Post Note  Patient: Charles Richard  Procedure(s) Performed: Procedure(s) (LRB): REVERSE RIGHT SHOULDER ARTHROPLASTY (Right)     Anesthesia Post Evaluation  Last Vitals:  Vitals:   07/20/17 1740 07/20/17 1750  BP: 113/65 107/67  Pulse: 84 86  Resp: 17 14  Temp:    SpO2: 96% 95%    Last Pain:  Vitals:   07/20/17 1750  TempSrc:   PainSc: Asleep                 Lamar Naef DAVID

## 2017-07-20 NOTE — Transfer of Care (Signed)
Immediate Anesthesia Transfer of Care Note  Patient: Charles Richard  Procedure(s) Performed: Procedure(s) with comments: REVERSE RIGHT SHOULDER ARTHROPLASTY (Right) - 150 mins  Patient Location: PACU  Anesthesia Type:General  Level of Consciousness: awake, oriented and patient cooperative  Airway & Oxygen Therapy: Patient Spontanous Breathing and Patient connected to nasal cannula oxygen  Post-op Assessment: Report given to RN  Post vital signs: Reviewed and stable  Last Vitals:  Vitals:   07/20/17 1407 07/20/17 1725  BP: (!) 85/56 115/62  Pulse:  72  Resp:  15  Temp:  36.7 C  SpO2:  100%    Last Pain:  Vitals:   07/20/17 1725  TempSrc:   PainSc: (P) 0-No pain         Complications: No apparent anesthesia complications

## 2017-07-20 NOTE — Anesthesia Procedure Notes (Signed)
Procedure Name: Intubation Date/Time: 07/20/2017 2:47 PM Performed by: Izora Gala Pre-anesthesia Checklist: Patient identified, Emergency Drugs available and Suction available Patient Re-evaluated:Patient Re-evaluated prior to induction Oxygen Delivery Method: Circle system utilized Preoxygenation: Pre-oxygenation with 100% oxygen Induction Type: IV induction Ventilation: Mask ventilation without difficulty Laryngoscope Size: Mac and 3 Grade View: Grade I Tube type: Oral Tube size: 7.5 mm Number of attempts: 1 Airway Equipment and Method: Stylet Placement Confirmation: ETT inserted through vocal cords under direct vision,  positive ETCO2 and breath sounds checked- equal and bilateral Secured at: 22 cm Tube secured with: Tape Dental Injury: Teeth and Oropharynx as per pre-operative assessment

## 2017-07-20 NOTE — Anesthesia Preprocedure Evaluation (Signed)
Anesthesia Evaluation  Patient identified by MRN, date of birth, ID band Patient awake    Reviewed: Allergy & Precautions, NPO status , Patient's Chart, lab work & pertinent test results, reviewed documented beta blocker date and time   Airway Mallampati: II  TM Distance: >3 FB Neck ROM: Full    Dental  (+) Dental Advisory Given   Pulmonary neg pulmonary ROS,    breath sounds clear to auscultation       Cardiovascular hypertension, Pt. on medications and Pt. on home beta blockers + Peripheral Vascular Disease  + dysrhythmias Atrial Fibrillation  Rhythm:Regular Rate:Normal     Neuro/Psych CVA    GI/Hepatic negative GI ROS, Neg liver ROS,   Endo/Other  diabetes, Type 2, Oral Hypoglycemic Agents  Renal/GU CRFRenal disease     Musculoskeletal  (+) Arthritis ,   Abdominal   Peds  Hematology negative hematology ROS (+)   Anesthesia Other Findings   Reproductive/Obstetrics                             Lab Results  Component Value Date   WBC 11.7 (H) 07/19/2017   HGB 11.1 (L) 07/19/2017   HCT 33.3 (L) 07/19/2017   MCV 94.3 07/19/2017   PLT 429 (H) 07/19/2017   Lab Results  Component Value Date   CREATININE 1.85 (H) 07/19/2017   BUN 46 (H) 07/19/2017   NA 136 07/19/2017   K 4.8 07/19/2017   CL 103 07/19/2017   CO2 21 (L) 07/19/2017    Anesthesia Physical  Anesthesia Plan  ASA: III  Anesthesia Plan: General   Post-op Pain Management: GA combined w/ Regional for post-op pain   Induction: Intravenous  PONV Risk Score and Plan: 2 and Ondansetron and Treatment may vary due to age or medical condition  Airway Management Planned: Oral ETT  Additional Equipment:   Intra-op Plan:   Post-operative Plan: Extubation in OR  Informed Consent: I have reviewed the patients History and Physical, chart, labs and discussed the procedure including the risks, benefits and alternatives for  the proposed anesthesia with the patient or authorized representative who has indicated his/her understanding and acceptance.   Dental advisory given  Plan Discussed with: CRNA  Anesthesia Plan Comments:         Anesthesia Quick Evaluation

## 2017-07-20 NOTE — H&P (Signed)
ORTHOPAEDIC CONSULTATION  REQUESTING PHYSICIAN: Nicholes Stairs, MD  PCP:  [facility], [facility]  Chief Complaint: Right proximal humerus fracture  HPI: Charles Richard is a 83 y.o. male who complains of pain of the right shoulder following a fall on 07/06/17.  Charles Richard is a right handed individual that is independent with his ADLs but does live with his daughter and presented for follow up of his right proximal humerus fracture this week in our office with my partner Dr. Onnie Graham.  He recommended reverse shoulder arthroplasty for the most reliable outcome.  Unfortunately he was not able to get this done in a timely manner and asked for my assistance in definitive care.  We met this week in my office and I recommended moving forward this week with reverse TSA for the comminuted and displaced right proximal humerus fracture, and he is here today for that surgery.  He has multiple medical problems including DM and aFib, he has not taken his eluiqis since Tuesday.  He denies any numbness or paresthesias of the right arm.  Past Medical History:  Diagnosis Date  . AAA (abdominal aortic aneurysm) without rupture ([place]) 06/03/2017  . Arthritis   . Atrial fibrillation with rapid ventricular response ([place]) 04/2017   New diagnosis during admission for urosepsis secondary to left nephrolithiasis/ureteral stone with hydronephrosis  . Diabetes mellitus without complication ([place])   . Dysrhythmia    a fib  . Essential hypertension   . Hard of hearing   . History of CVA (cerebrovascular accident) without residual deficits 05/2009   Admitted with expressive aphasia & confusion following a neurocardiogenic syncope event (related to dehydration, AKD & BB related hypotension/bradycardia)  . History of kidney stones   . Hyperlipidemia   . Left nephrolithiasis 04/2017   s/p L Ureteral stent for L hydronephrosis & UTI/Urosepsis.    . Myocardial infarction ([place])   . Stroke [facility])    mild  stroke no residuals   Past Surgical History:  Procedure Laterality Date  . CHOLECYSTECTOMY    . CYSTOSCOPY W/ URETERAL STENT PLACEMENT Left 05/09/2017   Procedure: CYSTOSCOPY WITH RETROGRADE PYELOGRAM/URETERAL STENT PLACEMENT;  Surgeon: Festus Aloe, MD;  Location: [facility];  Service: Urology;  Laterality: Left;  . CYSTOSCOPY WITH RETROGRADE PYELOGRAM, URETEROSCOPY AND STENT PLACEMENT Left 06/08/2017   Procedure: CYSTOSCOPY WITH LEFT RETROGRADE PYELOGRAM, URETEROSCOPY HOLMIUM LASER LITHOTRIPSY BASKET EXTRACTION  AND STENT EXCHANGE;  Surgeon: Festus Aloe, MD;  Location: [facility];  Service: Urology;  Laterality: Left;  . EYE SURGERY     right  . HOLMIUM LASER APPLICATION Left 0/93/2671   Procedure: HOLMIUM LASER APPLICATION;  Surgeon: Festus Aloe, MD;  Location: [facility];  Service: Urology;  Laterality: Left;  . LITHOTRIPSY     Social History   Social History  . Marital status: Widowed    Spouse name: N/A  . Number of children: N/A  . Years of education: N/A   Social History Main Topics  . Smoking status: Never Smoker  . Smokeless tobacco: Never Used  . Alcohol use No  . Drug use: No  . Sexual activity: Not Currently   Other Topics Concern  . Not on file   Social History Narrative  . No narrative on file   Family History  Problem Relation Age of Onset  . Lung cancer Father        black lung  . CAD Neg Hx   . Kidney Stones Neg Hx   . Aneurysm Neg Hx  No Known Allergies Prior to Admission medications   Medication Sig Start Date End Date Taking? Authorizing Provider  amiodarone (PACERONE) 200 MG tablet Take 1 tablet by mouth daily. 07/04/17  Yes [provider]  carvedilol (COREG) 6.25 MG tablet Take 6.25 mg by mouth daily with breakfast.   Yes [provider]  cholecalciferol (VITAMIN D) 1000 units tablet Take 1,000 Units by mouth at bedtime.   Yes [provider]  docusate sodium (COLACE) 100 MG capsule Take 200 mg by mouth daily.    Yes [provider]  lisinopril (PRINIVIL,ZESTRIL) 40 MG tablet Take 0.5 tablets (20 mg total) by mouth daily with breakfast. HOLD UNTIL FOLLOW UP WITH PCP 05/11/17  Yes Barton Dubois, MD  loratadine (CLARITIN) 10 MG tablet Take 10 mg by mouth daily with breakfast.    Yes [provider]  meclizine (ANTIVERT) 25 MG tablet Take 25 mg by mouth 2 (two) times daily.    Yes [provider]  metFORMIN (GLUCOPHAGE) 1000 MG tablet Take 1,000 mg by mouth 2 (two) times daily with a meal.   Yes [provider]  Multiple Vitamin (MULTIVITAMIN WITH MINERALS) TABS tablet Take 1 tablet by mouth at bedtime.   Yes [provider]  ondansetron (ZOFRAN) 4 MG tablet Take 1 tablet (4 mg total) by mouth every 6 (six) hours as needed for nausea. 2/87/86  Yes Delora Fuel, MD  simvastatin (ZOCOR) 20 MG tablet Take 10 mg by mouth daily with breakfast.   Yes [provider]  traMADol (ULTRAM) 50 MG tablet Take 100 mg by mouth every 6 (six) hours as needed for moderate pain.   Yes [provider]  apixaban (ELIQUIS) 2.5 MG TABS tablet Take 1 tablet (2.5 mg total) by mouth 2 (two) times daily. 05/11/17   Barton Dubois, MD  oxyCODONE-acetaminophen (PERCOCET) 5-325 MG tablet Take 1 tablet by mouth every 4 (four) hours as needed for moderate pain. 7/67/20   Delora Fuel, MD  Propylene Glycol (SYSTANE BALANCE) 0.6 % SOLN Place 1-2 drops into both eyes daily as needed (for dry eyes).    [provider]   No results found.  Positive ROS: All other systems have been reviewed and were otherwise negative with the exception of those mentioned in the HPI and as above.  Physical Exam: General: Alert, no acute distress Cardiovascular: No pedal edema Respiratory: No cyanosis, no use of accessory musculature GI: No organomegaly, abdomen is soft and non-tender Skin: No lesions in the area of chief complaint Neurologic: Sensation intact distally Psychiatric: Patient  is competent for consent with normal mood and affect Lymphatic: No axillary or cervical lymphadenopathy  MUSCULOSKELETAL:  Right arm-  Marked ecchymosis and edema about the proximal humerus and axilla.  There is a small abrasion over the olecranon but no open wounds by the fracture.  SILT ax/MC/LABC/med/uln/rad, but a little decreased in the ulnar nerve, motor intact in ax/MC/med/uln/rad/ain/pin.  2+ rad pulse  Assessment: Right closed comminuted displaced proximal humerus fracture  Plan: -we discussed again our recommendation of right reverse shoulder arthroplasty for management of this injury pattern in his age group. -he has received pre operative assessment for this procedure and is optimized to move forward -we will resume his anti coagulation on the first day after surgery, and will be admitted for routine postoperative care -The risks, benefits, and alternatives were discussed with the patient. There are risks associated with the surgery including, but not limited to, problems with anesthesia (death), infection, differences in length/angulation/rotation,  fracture of bones, loosening or failure of implants, malunion, nonunion, hematoma (blood accumulation) which may require surgical drainage, blood clots, pulmonary embolism, nerve injury (foot drop), and blood vessel injury. The patient understands these risks and elects to proceed.     Nicholes Stairs, MD Cell [phone]    07/20/2017 12:40 PM

## 2017-07-21 LAB — BASIC METABOLIC PANEL
Anion gap: 8 (ref 5–15)
BUN: 31 mg/dL — ABNORMAL HIGH (ref 6–20)
CALCIUM: 8.4 mg/dL — AB (ref 8.9–10.3)
CO2: 23 mmol/L (ref 22–32)
CREATININE: 1.39 mg/dL — AB (ref 0.61–1.24)
Chloride: 107 mmol/L (ref 101–111)
GFR, EST AFRICAN AMERICAN: 52 mL/min — AB (ref >60)
GFR, EST NON AFRICAN AMERICAN: 45 mL/min — AB (ref >60)
Glucose, Bld: 127 mg/dL — ABNORMAL HIGH (ref 65–99)
Potassium: 4.6 mmol/L (ref 3.5–5.1)
Sodium: 138 mmol/L (ref 135–145)

## 2017-07-21 LAB — CBC
HEMATOCRIT: 28 % — AB (ref 39.0–52.0)
Hemoglobin: 9.1 g/dL — ABNORMAL LOW (ref 13.0–17.0)
MCH: 31.1 pg (ref 26.0–34.0)
MCHC: 32.5 g/dL (ref 30.0–36.0)
MCV: 95.6 fL (ref 78.0–100.0)
PLATELETS: 343 10*3/uL (ref 150–400)
RBC: 2.93 MIL/uL — ABNORMAL LOW (ref 4.22–5.81)
RDW: 14.4 % (ref 11.5–15.5)
WBC: 10.3 10*3/uL (ref 4.0–10.5)

## 2017-07-21 LAB — GLUCOSE, CAPILLARY
GLUCOSE-CAPILLARY: 190 mg/dL — AB (ref 65–99)
GLUCOSE-CAPILLARY: 113 mg/dL — AB (ref 65–99)
Glucose-Capillary: 107 mg/dL — ABNORMAL HIGH (ref 65–99)
Glucose-Capillary: 162 mg/dL — ABNORMAL HIGH (ref 65–99)

## 2017-07-21 NOTE — Evaluation (Signed)
Physical Therapy Evaluation Patient Details Name: Charles KeelRonald E Zegers MRN: [id number] DOB: 09/28/1930 Today's Date: 07/21/2017   History of Present Illness  Charles KeelRonald E Mccollum is a 83 y.o. male who complains of pain of the right shoulder following a fall on 07/06/17 resulting in R proximal humerus fx. Pt is a right handed individual that is independent with his ADLs but does live with his daughter; now s/p Reverse TSA RUE;  has a past medical history of AAA (abdominal aortic aneurysm) without rupture (HCC) (06/03/2017); Arthritis; Atrial fibrillation with rapid ventricular response (HCC) (04/2017); Diabetes mellitus without complication (HCC); Dysrhythmia; Essential hypertension; Hard of hearing; History of CVA (cerebrovascular accident) without residual deficits (05/2009); Myocardial infarction [facility](HCC)  Clinical Impression   Patient is s/p above surgery resulting in functional limitations due to the deficits listed below (see PT Problem List). Prior to admission for Rev TSA, pt and daughter managing at home, with pt mostly sitting and having assist to get up; they have mocked up a makeshift ramp ( [facility] one); They have problem-solved through many aspects of functional mobility and ADLs while at home awaiting this surgery and have made it clear they would like to return home from this hospitalization; a big concern of mine is Mr. Linse's activity tolerance, as we noted BP drop today with attempts at standing activity (see below); will continue to monitor;  Patient will benefit from skilled PT to increase their independence and safety with mobility to allow discharge to the venue listed below.       Follow Up Recommendations Home health PT;Supervision/Assistance - 24 hour;Other (comment) (or as close to 24 hour assist as possible)    Equipment Recommendations  Other (comment) (pretty well-equipped; [facility])    Recommendations for Other Services        Precautions / Restrictions Precautions Precautions: Fall;Shoulder Type of Shoulder Precautions: Conservative shoulder; AROM wrist, elbow, and hand Restrictions Weight Bearing Restrictions: Yes RUE Weight Bearing: Non weight bearing Other Position/Activity Restrictions: watch orthostatics      Mobility  Bed Mobility Overal bed mobility: Needs Assistance Bed Mobility: Supine to Sit     Supine to sit: Mod assist;+2 for physical assistance;HOB elevated (Daughter assisting)     General bed mobility comments: OT performed prior to PT arrival  Transfers Overall transfer level: Needs assistance Equipment used: 1 person hand held assist (with 2nd person for safety) Transfers: Sit to/from Stand Sit to Stand: Mod assist         General transfer comment: mod assist to power up and steady; support given on pt's L side  Ambulation/Gait Ambulation/Gait assistance: Min assist Ambulation Distance (Feet):  (pivotal steps bed to chair) Assistive device: 1 person hand held assist Gait Pattern/deviations: Shuffle     General Gait Details: Cues to self-monitor for activity tolerance; needs the handheld assist  Stairs            Wheelchair Mobility    Modified Rankin (Stroke Patients Only)       Balance Overall balance assessment: Needs assistance           Standing balance-Leahy Scale: Poor                               Pertinent Vitals/Pain Pain Assessment: 0-10 Pain Score: 4  Pain Location: R shoulder Pain Descriptors / Indicators: Aching Pain Intervention(s): Premedicated before session;Ice applied  Home Living Family/patient expects to be discharged to:: Private residence Living Arrangements: Children Available Help at Discharge: Family;Available PRN/intermittently Type of Home: House Home Access: Stairs to enter ([place] putting in a permanent ramp; temp one for now)   Entrance Stairs-Number of Steps: 2 Home Layout: One level Home  Equipment: Cane - single point;Transport chair;Shower seat;Toilet riser;Bedside commode;Hand held shower head (Lift chair and lift bed)      Prior Function Level of Independence: Independent with assistive device(s)         Comments: Pt was independent before fall in July. Pt daughter assiting with all ADLs since fall.      Hand Dominance   Dominant Hand: Right    Extremity/Trunk Assessment   Upper Extremity Assessment Upper Extremity Assessment: Defer to OT evaluation    Lower Extremity Assessment Lower Extremity Assessment: Generalized weakness    Cervical / Trunk Assessment Cervical / Trunk Assessment: Normal  Communication   Communication: HOH  Cognition Arousal/Alertness: Awake/alert Behavior During Therapy: WFL for tasks assessed/performed Overall Cognitive Status: Within Functional Limits for tasks assessed                                        General Comments General comments (skin integrity, edema, etc.):   07/21/17 1222 07/21/17 1223  Orthostatic Sitting  BP- Sitting 107/82 109/62  Pulse- Sitting 124 109  Orthostatic Standing at 0 minutes  BP- Standing at 0 minutes (!) 58/45 Symptomatic for dizziness/nausea --   Pulse- Standing at 0 minutes 154 --        Exercises     Assessment/Plan    PT Assessment Patient needs continued PT services  PT Problem List Decreased strength;Decreased range of motion;Decreased activity tolerance;Decreased balance;Decreased mobility;Decreased knowledge of use of DME;Decreased knowledge of precautions;Pain       PT Treatment Interventions DME instruction;Gait training;Functional mobility training;Therapeutic activities;Therapeutic exercise;Balance training;Patient/family education    PT Goals (Current goals can be found in the Care Plan section)  Acute Rehab PT Goals Patient Stated Goal: to get home PT Goal Formulation: With patient/family Time For Goal Achievement: 08/04/17 Potential to Achieve  Goals: Good    Frequency Min 5X/week   Barriers to discharge        Co-evaluation PT/OT/SLP Co-Evaluation/Treatment: Yes Reason for Co-Treatment: For patient/therapist safety PT goals addressed during session: Mobility/safety with mobility         AM-PAC PT "6 Clicks" Daily Activity  Outcome Measure Difficulty turning over in bed (including adjusting bedclothes, sheets and blankets)?: Total Difficulty moving from lying on back to sitting on the side of the bed? : Total Difficulty sitting down on and standing up from a chair with arms (e.g., wheelchair, bedside commode, etc,.)?: Total Help needed moving to and from a bed to chair (including a wheelchair)?: A Lot Help needed walking in hospital room?: A Lot Help needed climbing 3-5 steps with a railing? : Total 6 Click Score: 8    End of Session Equipment Utilized During Treatment: Gait belt Cleon Dew) Activity Tolerance: Patient limited by fatigue Patient left: in chair;with call bell/phone within reach;with family/visitor present Nurse Communication: Mobility status;Other (comment) (low BPs as noted in doc flowsheets) PT Visit Diagnosis: Unsteadiness on feet (R26.81);Other abnormalities of gait and mobility (R26.89);Pain Pain - Right/Left: Right Pain - part of body: Shoulder    Time: 1610-9604 PT Time Calculation (min) (ACUTE ONLY): 34 min   Charges:   PT  Evaluation $PT Eval Moderate Complexity: 1 Mod     PT G Codes:        Van Clines, PT  Acute Rehabilitation Services Pager [phone] Office [phone]   Levi Aland 07/21/2017, 1:08 PM

## 2017-07-21 NOTE — Progress Notes (Signed)
    Subjective: 1 Day Post-Op Procedure(s) (LRB): REVERSE RIGHT SHOULDER ARTHROPLASTY (Right) Patient reports pain as 5 on 0-10 scale.   Denies CP or SOB.  Voiding without difficulty. Positive flatus. Objective: Vital signs in last 24 hours: Temp:  [97 F (36.1 C)-99.6 F (37.6 C)] 99.6 F (37.6 C) (08/11 0500) Pulse Rate:  [72-108] 108 (08/11 0500) Resp:  [10-21] 16 (08/11 0500) BP: (80-123)/(43-80) 111/61 (08/11 0500) SpO2:  [95 %-100 %] 96 % (08/11 0500) Weight:  [94.4 kg (208 lb 1.6 oz)] 94.4 kg (208 lb 1.6 oz) (08/10 1242)  Intake/Output from previous day: 08/10 0701 - 08/11 0700 In: 1550 [I.V.:1050; IV Piggyback:500] Out: 350 [Blood:350] Intake/Output this shift: No intake/output data recorded.  Labs:  Recent Labs  07/19/17 1432 07/21/17 0332  HGB 11.1* 9.1*    Recent Labs  07/19/17 1432 07/21/17 0332  WBC 11.7* 10.3  RBC 3.53* 2.93*  HCT 33.3* 28.0*  PLT 429* 343    Recent Labs  07/19/17 1432 07/21/17 0332  NA 136 138  K 4.8 4.6  CL 103 107  CO2 21* 23  BUN 46* 31*  CREATININE 1.85* 1.39*  GLUCOSE 166* 127*  CALCIUM 9.0 8.4*    Recent Labs  07/20/17 1221  INR 1.14    Physical Exam: Neurologically intact Neurovascular intact Intact pulses distally Incision: dressing C/D/I Compartment soft  Assessment/Plan: 1 Day Post-Op Procedure(s) (LRB): REVERSE RIGHT SHOULDER ARTHROPLASTY (Right) Advance diet Up with therapy Plan for discharge tomorrow  Naiyana Barbian D for Dr. Venita Lickahari Torey Regan [facility] [phone] 07/21/2017, 8:39 AM

## 2017-07-21 NOTE — Plan of Care (Signed)
Problem: Safety: Goal: Ability to remain free from injury will improve Outcome: Progressing Safety precautions maintained, no safety issues noted  Problem: Pain Managment: Goal: General experience of comfort will improve Outcome: Progressing Denies pain, does not have sensation to right upper extremity  Problem: Physical Regulation: Goal: Will remain free from infection Outcome: Progressing No signs of infection noted  Problem: Skin Integrity: Goal: Risk for impaired skin integrity will decrease Outcome: Not Progressing Bruises noted to right shoulder and right hip, aquacel clean dry and intact to right shoulder incision  Problem: Tissue Perfusion: Goal: Risk factors for ineffective tissue perfusion will decrease Outcome: Progressing SCDs are on, patient is on Eliquis  Problem: Bowel/Gastric: Goal: Will not experience complications related to bowel motility Outcome: Progressing No gastric or bowel issues reported

## 2017-07-21 NOTE — Evaluation (Signed)
Occupational Therapy Evaluation Patient Details Name: Charles Richard MRN: [id number] DOB: 01/04/1930 Today's Date: 07/21/2017    History of Present Illness Charles Richard is a 83 y.o. male who complains of pain of the right shoulder following a fall on 07/06/17 resulting in R proximal humerus fx. Pt is a right handed individual that is independent with his ADLs but does live with his daughter; now s/p Reverse TSA RUE;  has a past medical history of AAA (abdominal aortic aneurysm) without rupture (HCC) (06/03/2017); Arthritis; Atrial fibrillation with rapid ventricular response (HCC) (04/2017); Diabetes mellitus without complication (HCC); Dysrhythmia; Essential hypertension; Hard of hearing; History of CVA (cerebrovascular accident) without residual deficits (05/2009); Myocardial infarction [facility](HCC)   Clinical Impression   PTA, pt was living with his daughter who was assisting with ADLs and functional mobility after pt's recent fall. Pt currently requiring Max A for UB ADLs and Mod A +2 for functional mobility. Pt demonstrating decreased activity tolerance and as seen by BP drop during transfers. Provided pt and daughter with education and handout on shoulder precautions, exercises, UB ADLs, edema management, RUE positioning, and sling management. Daughter very involved during session and very supportive. Pt would benefit from post-acute rehab; Daughter and pt stating they want to return home. Recommend dc with HHOT, HH aide, and 24 hour assistance. Will continue to follow acutely to optimize occupational performance na facilitate safe dc.     Follow Up Recommendations  Home health OT;Supervision/Assistance - 24 hour;Other (comment) (Pt would benefit from post-acute, but family wants home)    Equipment Recommendations  None recommended by OT    Recommendations for Other Services PT consult     Precautions / Restrictions Precautions Precautions: Fall;Shoulder Type of Shoulder Precautions:  Conservative shoulder; AROM wrist, elbow, and hand Restrictions Weight Bearing Restrictions: Yes RUE Weight Bearing: Non weight bearing Other Position/Activity Restrictions: watch orthostatics      Mobility Bed Mobility Overal bed mobility: Needs Assistance Bed Mobility: Supine to Sit     Supine to sit: Mod assist;+2 for physical assistance;HOB elevated (Daughter assisting)     General bed mobility comments: Pt requiring Mod A +2 to transition hips towards EOB and elevate trunk into sitting  Transfers Overall transfer level: Needs assistance Equipment used: 1 person hand held assist (with 2nd person for safety) Transfers: Sit to/from Stand Sit to Stand: Mod assist         General transfer comment: mod assist to power up and steady; support given on pt's L side    Balance Overall balance assessment: Needs assistance Sitting-balance support: No upper extremity supported;Feet supported Sitting balance-Leahy Scale: Fair     Standing balance support: Single extremity supported;During functional activity Standing balance-Leahy Scale: Poor Standing balance comment: Reliant on UE support                           ADL either performed or assessed with clinical judgement   ADL Overall ADL's : Needs assistance/impaired         Upper Body Bathing: Maximal assistance;Sitting   Lower Body Bathing: Maximal assistance;With caregiver independent assisting;Sit to/from stand   Upper Body Dressing : Maximal assistance;With caregiver independent assisting;Sitting;Cueing for UE precautions;Adhering to UE precautions   Lower Body Dressing: Maximal assistance;Sit to/from stand;With caregiver independent assisting   Toilet Transfer: Moderate assistance;+2 for safety/equipment;Stand-pivot (Simulated to recliner)           Functional mobility during ADLs: Moderate assistance;+2 for safety/equipment (single hand held  A) General ADL Comments: Daughter assisting with ADLs  and functional mobility. Pt demonstrating decreased functional performance and activity tolerance. Pt would benefit from psot acute rehab, but daughter and pt say they want to dc home     Vision         Perception     Praxis      Pertinent Vitals/Pain Pain Assessment: 0-10 Pain Score: 4  Pain Location: R shoulder Pain Descriptors / Indicators: Aching Pain Intervention(s): Premedicated before session;Monitored during session;Limited activity within patient's tolerance;Repositioned     Hand Dominance Right   Extremity/Trunk Assessment Upper Extremity Assessment Upper Extremity Assessment: RUE deficits/detail RUE Deficits / Details: R reverse total shoulder. limited ROM of wrist and elbow. WFL composite grasp RUE: Unable to fully assess due to immobilization;Unable to fully assess due to pain RUE Coordination: decreased fine motor;decreased gross motor   Lower Extremity Assessment Lower Extremity Assessment: Generalized weakness   Cervical / Trunk Assessment Cervical / Trunk Assessment: Normal   Communication Communication Communication: HOH   Cognition Arousal/Alertness: Awake/alert Behavior During Therapy: WFL for tasks assessed/performed Overall Cognitive Status: Within Functional Limits for tasks assessed                                     General Comments  BP 107/82 sitting EOB. Pt with decreased activity tolerance and became fluss, nauseous, and sweaty with sit<>stand    Exercises Exercises: Shoulder Shoulder Exercises Elbow Flexion: AROM;15 reps;Right;Seated Elbow Extension: AROM;Right;15 reps;Seated Wrist Flexion: AROM;Right;10 reps;Seated Wrist Extension: AROM;Right;15 reps;Seated Digit Composite Flexion: AROM;Right;15 reps;Seated Composite Extension: PROM;Right;Supine;Seated Neck Flexion: AROM;5 reps;Seated Neck Extension: AROM;5 reps;Seated Neck Lateral Flexion - Right: AROM;5 reps;Seated Neck Lateral Flexion - Left: AROM;5  reps;Seated   Shoulder Instructions Shoulder Instructions Donning/doffing shirt without moving shoulder: Maximal assistance;Patient able to independently direct caregiver Method for sponge bathing under operated UE: Maximal assistance;Patient able to independently direct caregiver Donning/doffing sling/immobilizer: Maximal assistance;Patient able to independently direct caregiver Correct positioning of sling/immobilizer: Maximal assistance;Patient able to independently direct caregiver ROM for elbow, wrist and digits of operated UE: Minimal assistance;Patient able to independently direct caregiver Sling wearing schedule (on at all times/off for ADL's): Maximal assistance;Patient able to independently direct caregiver Proper positioning of operated UE when showering: Maximal assistance;Patient able to independently direct caregiver Positioning of UE while sleeping: Maximal assistance;Patient able to independently direct caregiver    Home Living Family/patient expects to be discharged to:: Private residence Living Arrangements: Children (Daughter who works during the day) Available Help at Discharge: Family;Available PRN/intermittently Type of Home: House Home Access: Stairs to enter ([place] putting in a permanent ramp; temp one for now) Entrance Stairs-Number of Steps: 2   Home Layout: One level     Bathroom Shower/Tub: Producer, television/film/video: Standard     Home Equipment: Cane - single point;Transport chair;Shower seat;Toilet riser;Bedside commode;Hand held shower head (Lift chair and lift bed)          Prior Functioning/Environment Level of Independence: Independent with assistive device(s)        Comments: Pt was independent before fall in July. Pt daughter assiting with all ADLs since fall.         OT Problem List: Decreased strength;Decreased range of motion;Decreased activity tolerance;Impaired balance (sitting and/or standing);Decreased safety awareness;Decreased  knowledge of use of DME or AE;Decreased knowledge of precautions;Pain;Impaired UE functional use      OT Treatment/Interventions: Self-care/ADL training;Therapeutic exercise;Energy conservation;DME and/or  AE instruction;Therapeutic activities;Patient/family education    OT Goals(Current goals can be found in the care plan section) Acute Rehab OT Goals Patient Stated Goal: to get home OT Goal Formulation: With patient Time For Goal Achievement: 08/04/17 Potential to Achieve Goals: Good ADL Goals Pt Will Perform Upper Body Bathing: with min assist;with caregiver independent in assisting;sitting Pt Will Perform Upper Body Dressing: with min assist;with caregiver independent in assisting;sitting Pt Will Transfer to Toilet: with min guard assist;stand pivot transfer;bedside commode Pt Will Perform Toileting - Clothing Manipulation and hygiene: with min guard assist;sit to/from stand;with caregiver independent in assisting Pt/caregiver will Perform Home Exercise Program: Right Upper extremity;Increased ROM;With written HEP provided;With Supervision  OT Frequency: Min 3X/week   Barriers to D/C:            Co-evaluation PT/OT/SLP Co-Evaluation/Treatment: Yes Reason for Co-Treatment: For patient/therapist safety PT goals addressed during session: Mobility/safety with mobility OT goals addressed during session: ADL's and self-care      AM-PAC PT "6 Clicks" Daily Activity     Outcome Measure Help from another person eating meals?: A Little Help from another person taking care of personal grooming?: A Little Help from another person toileting, which includes using toliet, bedpan, or urinal?: A Lot Help from another person bathing (including washing, rinsing, drying)?: A Lot Help from another person to put on and taking off regular upper body clothing?: A Lot Help from another person to put on and taking off regular lower body clothing?: A Lot 6 Click Score: 14   End of Session Equipment  Utilized During Treatment: Gait belt;Other (comment) (Sling) Nurse Communication: Mobility status (Nauseous and drop in BP)  Activity Tolerance: Patient limited by pain;Patient limited by fatigue Patient left: in chair;with call bell/phone within reach;with family/visitor present  OT Visit Diagnosis: Unsteadiness on feet (R26.81);Other abnormalities of gait and mobility (R26.89);Muscle weakness (generalized) (M62.81);Pain;History of falling (Z91.81) Pain - Right/Left: Right Pain - part of body: Shoulder                Time: 1610-9604 OT Time Calculation (min): 52 min Charges:  OT General Charges $OT Visit: 1 Procedure OT Evaluation $OT Eval Moderate Complexity: 1 Procedure OT Treatments $Self Care/Home Management : 8-22 mins G-Codes:     Ksenia Kunz MSOT, OTR/L Acute Rehab Pager: [phone] Office: [phone]  Charles Richard 07/21/2017, 3:27 PM

## 2017-07-22 LAB — CBC
HEMATOCRIT: 26.1 % — AB (ref 39.0–52.0)
HEMOGLOBIN: 8.5 g/dL — AB (ref 13.0–17.0)
MCH: 31 pg (ref 26.0–34.0)
MCHC: 32.6 g/dL (ref 30.0–36.0)
MCV: 95.3 fL (ref 78.0–100.0)
Platelets: 312 10*3/uL (ref 150–400)
RBC: 2.74 MIL/uL — AB (ref 4.22–5.81)
RDW: 14.5 % (ref 11.5–15.5)
WBC: 10.7 10*3/uL — AB (ref 4.0–10.5)

## 2017-07-22 LAB — GLUCOSE, CAPILLARY
GLUCOSE-CAPILLARY: 102 mg/dL — AB (ref 65–99)
GLUCOSE-CAPILLARY: 145 mg/dL — AB (ref 65–99)

## 2017-07-22 LAB — BASIC METABOLIC PANEL
ANION GAP: 8 (ref 5–15)
BUN: 25 mg/dL — ABNORMAL HIGH (ref 6–20)
CALCIUM: 8.2 mg/dL — AB (ref 8.9–10.3)
CO2: 26 mmol/L (ref 22–32)
Chloride: 104 mmol/L (ref 101–111)
Creatinine, Ser: 1.33 mg/dL — ABNORMAL HIGH (ref 0.61–1.24)
GFR calc non Af Amer: 47 mL/min — ABNORMAL LOW (ref >60)
GFR, EST AFRICAN AMERICAN: 55 mL/min — AB (ref >60)
Glucose, Bld: 107 mg/dL — ABNORMAL HIGH (ref 65–99)
POTASSIUM: 4 mmol/L (ref 3.5–5.1)
Sodium: 138 mmol/L (ref 135–145)

## 2017-07-22 MED ORDER — OXYCODONE HCL 5 MG PO TABS: 5.0000 mg | ORAL_TABLET | ORAL | 0 refills | Status: DC | PRN

## 2017-07-22 NOTE — Progress Notes (Signed)
Occupational Therapy Treatment Patient Details Name: Charles Richard MRN: [id number] DOB: Feb 09, 1930 Today's Date: 07/22/2017    History of present illness Charles Richard is a 83 y.o. male who complains of pain of the right shoulder following a fall on 07/06/17 resulting in R proximal humerus fx. Pt is a right handed individual that is independent with his ADLs but does live with his daughter; now s/p Reverse TSA RUE;  has a past medical history of AAA (abdominal aortic aneurysm) without rupture (HCC) (06/03/2017); Arthritis; Atrial fibrillation with rapid ventricular response (HCC) (04/2017); Diabetes mellitus without complication (HCC); Dysrhythmia; Essential hypertension; Hard of hearing; History of CVA (cerebrovascular accident) without residual deficits (05/2009); Myocardial infarction [facility](HCC)   OT comments  Pt demonstrating increased activity tolerance and daughter present to assist with ADLs. Pt visited twice to increase safety and understanding of shoulder precautions with ADLs: first session focusing on exercises and second session focused on ADLs. Pt daughter present for both session and demonstrated good understanding of shoulder precautions and compensatory techniques for ADLs. Continues to recommend dc with HHOT to increase safety and independence. Answered all family and pt questions in preparation for dc later today.    Follow Up Recommendations  Home health OT;Supervision/Assistance - 24 hour  Equipment Recommendations  None recommended by OT    Recommendations for Other Services PT consult    Precautions / Restrictions Precautions Precautions: Fall;Shoulder Type of Shoulder Precautions: Conservative shoulder; AROM wrist, elbow, and hand Shoulder Interventions: Shoulder sling/immobilizer Precaution Booklet Issued: No Required Braces or Orthoses: Sling Restrictions Weight Bearing Restrictions: Yes RUE Weight Bearing: Non weight bearing Other Position/Activity Restrictions:  watch orthostatics       Mobility Bed Mobility Overal bed mobility: Needs Assistance Bed Mobility: Supine to Sit     Supine to sit: Min assist     General bed mobility comments: Min A to support R shoulder and elevate trunk in sitting. Daughter performed bed mobility  Transfers                      Balance Overall balance assessment: Needs assistance Sitting-balance support: No upper extremity supported;Feet supported Sitting balance-Leahy Scale: Fair     Standing balance support: Single extremity supported;During functional activity Standing balance-Leahy Scale: Poor Standing balance comment: Reliant on UE support                           ADL either performed or assessed with clinical judgement   ADL Overall ADL's : Needs assistance/impaired         Upper Body Bathing: Maximal assistance;Sitting Upper Body Bathing Details (indicate cue type and reason): Reviewed UB bathing     Upper Body Dressing : Maximal assistance;With caregiver independent assisting;Sitting;Cueing for UE precautions;Adhering to UE precautions Upper Body Dressing Details (indicate cue type and reason): Daughter demonstrating good understanding to assist with donning shirt. Also demo'd good sling management and positioning Lower Body Dressing: Maximal assistance;Sit to/from stand;With caregiver independent assisting Lower Body Dressing Details (indicate cue type and reason): Daughter assist with donning depends and pants Toilet Transfer:  (Simulated to recliner)           Functional mobility during ADLs:  (single hand held A) General ADL Comments: Pt seen twice, once in the morning for excercises and once again before dc for ADLs in preparation for dc today. Daughter present for both visits. Daughter demonstrating good understanding of ADLs to maintain shoulder precautions.  Vision       Perception     Praxis      Cognition Arousal/Alertness:  Awake/alert Behavior During Therapy: WFL for tasks assessed/performed Overall Cognitive Status: Within Functional Limits for tasks assessed                                          Exercises Exercises: Shoulder Shoulder Exercises Elbow Flexion: AROM;Right;Seated;20 reps;Supine Elbow Extension: AROM;Right;Seated;20 reps;Supine Wrist Flexion: Right;10 reps;Seated;AAROM Wrist Extension: Right;15 reps;Seated;AAROM Digit Composite Flexion: AROM;Right;15 reps;Seated Composite Extension: PROM;Right;Supine;Seated Neck Flexion: AROM;5 reps;Seated Neck Extension: AROM;5 reps;Seated Neck Lateral Flexion - Right: AROM;5 reps;Seated Neck Lateral Flexion - Left: AROM;5 reps;Seated   Shoulder Instructions Shoulder Instructions Donning/doffing shirt without moving shoulder: Maximal assistance;Caregiver independent with task Method for sponge bathing under operated UE: Maximal assistance;Caregiver independent with task Donning/doffing sling/immobilizer: Maximal assistance;Caregiver independent with task Correct positioning of sling/immobilizer: Maximal assistance;Caregiver independent with task ROM for elbow, wrist and digits of operated UE: Minimal assistance;Caregiver independent with task Sling wearing schedule (on at all times/off for ADL's): Caregiver independent with task;Moderate assistance Proper positioning of operated UE when showering: Moderate assistance;Caregiver independent with task Positioning of UE while sleeping: Maximal assistance;Caregiver independent with task     General Comments No symptoms of orthostatics this session    Pertinent Vitals/ Pain       Pain Assessment: Faces Faces Pain Scale: Hurts little more Pain Location: R shoulder Pain Descriptors / Indicators: Aching Pain Intervention(s): Monitored during session;Repositioned  Home Living                                          Prior Functioning/Environment               Frequency  Min 3X/week        Progress Toward Goals  OT Goals(current goals can now be found in the care plan section)  Progress towards OT goals: Progressing toward goals  Acute Rehab OT Goals Patient Stated Goal: to get home OT Goal Formulation: With patient Time For Goal Achievement: 08/04/17 Potential to Achieve Goals: Good ADL Goals Pt Will Perform Upper Body Bathing: with min assist;with caregiver independent in assisting;sitting Pt Will Perform Upper Body Dressing: with min assist;with caregiver independent in assisting;sitting Pt Will Transfer to Toilet: with min guard assist;stand pivot transfer;bedside commode Pt Will Perform Toileting - Clothing Manipulation and hygiene: with min guard assist;sit to/from stand;with caregiver independent in assisting Pt/caregiver will Perform Home Exercise Program: Right Upper extremity;Increased ROM;With written HEP provided;With Supervision  Plan Discharge plan remains appropriate    Co-evaluation                 AM-PAC PT "6 Clicks" Daily Activity     Outcome Measure   Help from another person eating meals?: A Little Help from another person taking care of personal grooming?: A Little Help from another person toileting, which includes using toliet, bedpan, or urinal?: A Lot Help from another person bathing (including washing, rinsing, drying)?: A Lot Help from another person to put on and taking off regular upper body clothing?: A Lot Help from another person to put on and taking off regular lower body clothing?: A Lot 6 Click Score: 14    End of Session Equipment Utilized During Treatment: Gait belt;Other (comment) (Sling)  OT Visit Diagnosis: Unsteadiness on feet (R26.81);Other abnormalities of gait and mobility (R26.89);Muscle weakness (generalized) (M62.81);Pain;History of falling (Z91.81) Pain - Right/Left: Right Pain - part of body: Shoulder   Activity Tolerance Patient limited by pain;Patient limited by  fatigue   Patient Left in chair;with call bell/phone within reach;with family/visitor present   Nurse Communication Mobility status (Nauseous and drop in BP)        Time: 9147-8295 and 1339-1350 OT Time Calculation (min): Total 44 min  Charges: OT General Charges $OT Visit: 2 Procedure OT Treatments $Self Care/Home Management : 23-37 mins $Therapeutic Activity: 8-22 mins  Dyson Sevey MSOT, OTR/L Acute Rehab Pager: [phone] Office: [phone]   Theodoro Grist Shaughnessy Gethers 07/22/2017, 2:21 PM

## 2017-07-22 NOTE — Op Note (Signed)
Date: 07/20/17  PRE-OPERATIVE DIAGNOSIS: Right proximal humerus fracture  POST-OPERATIVE DIAGNOSIS: Same  PROCEDURE: 1. REVERSE SHOULDER ARTHROPLASTY 2. Long head of biceps tenodesis to pectoralis major tendon  SURGEON: Yolonda KidaJason Patrick Rogers, MD  ASSISTANT: April Green, RNFA  ANESTHESIA: General with a block  ESTIMATED BLOOD LOSS: See anesthesia record  PREOPERATIVE INDICATIONS: Charles Richard is a 83 year old right-hand male who sustained a Right proximal humerus fracture following a fall.Due to the comminution and displaced nature of the fracture and The large medial calcar fragment that was likely to have compromise blood supply to the humeral head we discussed operative management.  He was initially seen in the office by my partner saw 1 of my groups of the shoulder surgeons and he had recommended arthroplasty as well, but was unfortunately not able to schedule a timely manner I was asked to assume care.  We discussed moving forward with reverse shoulder arthroplasty for his injury to allow earlier weight bearing on a walker and/or cane as needed and lower risk of AVN, non union or progression of shoulder arthritis following the fracture. Thus we elected to proceed with reverse shoulder arthroplasty for the plan. The risks benefits and alternatives were discussed with the patient preoperatively including but not limited to the risks of infection, bleeding, nerve injury, cardiopulmonary complications, the need for revision surgery, dislocation, brachial plexus palsy, incomplete relief of pain, among others, and the patient was willing to proceed. The patient DidLungs provided informed consent.  OPERATIVE IMPLANTS: Biomet size 12 humeral fracturestem  Cemented with a 44mm standard  +3 liner and a 36 mm +3 glenosphere with a 25 mm(mini) baseplate and 4, 4.75locking screws and one central 6.5 mm nonlocking screw.  OPERATIVE FINDINGS:  As this was essentially a  maximally displaced 100%  Two-part humerus fracture.  There was a nondisplaced fracture line in the biceps groove.  He did also have a large butterfly fragment of calcar just off the inferior margin of the humeral head that did appear to disrupt from an anatomic standpoint blood supply to the humeral head.  This free fragment was discarded as it had no soft tissue attachments.  Due to the medial bone loss we did elect to proceed with cementing of humeral component.  Also of note he had relatively healthy rotator cuff tissue so we did elect to repair the subscapularis as well as superior posterior rotator cuff tendon back to the prosthesis in place.  OPERATIVE PROCEDURE: The patient was brought to the operating room and placed in the supine position. General anesthesia was administered. IV antibiotics were given. Time out was performed. The upper extremity was prepped and draped in usual sterile fashion. The patient was in a beachchair position. Deltopectoral approach was carried out. After dissection through skin and subcutaneous fat, the cephalic vein was identified with the deltopectoral interval. This was mobilized and taken Laterally.  Large medial contributory proximally was managed with suture ligature.  The fracture was identified and working through the fracture in the biceps groove the lesser and greater tuberosities were freed up and tagged with #2 Fiber wire sutures. The humeral head was fragmented and removed from the wound. As was a large medial calcar butterfly fragment.  Next, the long head of the biceps tendon was tenodesed to the upper border of the pectoralis major tendon with 2 figure of 8 sutures using #2 Fiber wire.  I then performed circumferential releases of the humerus. I then moved to sizing the humerus. There was Large calcar bone loss  and this Made it somewhat difficult to reference the humeral stem height however used the upper border of the pectoralis major tendon as a  reference point.The canal was reamed and found to fit best with a 12mm fracture stem.  We next turned to the glenoid. Deep retractors were placed, and I resected the labrum as well as the residual long head of biceps, and then placed a guidepin into the center position on the glenoid, with slight inferior declination. I then reamed over the guidepin, and this created a small metaphyseal cancellus blush inferiorly, removing just the cartilage to the subchondral bone superiorly. The base plate was selected and impacted place, and then I secured it centrally with a nonlocking screw, and I had excellent purchase both inferiorly and superiorly. I placed a short locking screws on anterior aspect,  And posterior aspect.  I then turned my attention to the glenosphere, and impacted this into place, placing slight inferior offset.   The glenoid sphere was completely seated, and had engagement of the [facility] taper. I then turned my attention back to the humerus.   The 12mm fracture stem was seated to the appropriate height to allow approximate 5.6 cm from the top of the Pectoralis major tendon to the top of the glenosphere. The stem was placed in 30 degrees of retroversion. Due to metaphyseal bone loss were unable to press fit this fracture stem.  Therefore we did place a cement restrictor into the canal or the 12 mm fracture stem.  We cemented using a metaphyseal technique.   Once the stem was Satisfactorily cemented with 30 of retroversion we trialed poly liners. Of note: Initial trialing a 10 mm was felt to be appropriate however once cement hardened with the 10 mm stem was found to be loose.Thus we increase to a 12 Stem did cement technique to 30 of retroversion and was found to have absolute stability. Using the standard humeral metaglen, The shoulder had excellent motion, and was stable, Once but with more than 3 mm of shuck laterally.  Therefore we did elect to go to the +3 standard poly-for final  implant.  On trialing the +3 did have less shuck still maintain adequate glenohumeral motion.. The final poly was impacted and again showed good motion and stability. Next,I irrigated the wounds copiously.   The greater tuberosity was brought back to the humeral stem suture holes and secured with bone graft from the humeral head as augment. The lesser tuberosity was Likewise brought anteriorly to the humeral stem and sutures placed through suture holes in the prosthesis for fixation.  The deltoid was noted to have excellent tension. The axillary nerve was palpated at the end of implanting, and found to be in continuity and not under undo tension.  I then irrigated the shoulder copiously once more, repaired the deltopectoral interval with Vicryl followed by subcutaneous monocryl and then subcuticular monocrylwith Steri-Strips and Aquacel bandage for the skin. The patient was awakened and returned back in stable and satisfactory condition. There no complications and hetolerated the procedure well. All counts were correct. The patient awakened from general anesthesia with no complications and transferred to PACU in stable condition.  Postoperative Plan: Charles Richard will remain in his sling until regional block has worn off, but is ok to remove it for use with his walker as needed. and He should not lift over 5 pounds. The sling will be worn for 4 weeks. He will begin shoulder PT in 1 week from discharge. He will  be admitted for pain control and we will see him back in 2 weeks for a wound check. He will Was his preoperative Eliquis for DVT prophylaxis.

## 2017-07-22 NOTE — Progress Notes (Signed)
   Subjective: 2 Days Post-Op Procedure(s) (LRB): REVERSE RIGHT SHOULDER ARTHROPLASTY (Right) Patient reports pain as mild.   Patient seen in rounds for Dr. Stann Richard. Patient is well, and has had no acute complaints or problems other than soreness in his right shoulder. Denies SOB and chest pain. No issues overnight. Voiding well. Positive flatus.    Objective: Vital signs in last 24 hours: Temp:  [98.3 F (36.8 C)-99 F (37.2 C)] 98.8 F (37.1 C) (08/12 0300) Pulse Rate:  [100-105] 105 (08/12 0300) Resp:  [18] 18 (08/12 0300) BP: (101-119)/(66-71) 119/71 (08/12 0300) SpO2:  [96 %-97 %] 97 % (08/12 0300)  Intake/Output from previous day:  Intake/Output Summary (Last 24 hours) at 07/22/17 0818 Last data filed at 07/21/17 1100  Gross per 24 hour  Intake              420 ml  Output                0 ml  Net              420 ml     Labs:  Recent Labs  07/19/17 1432 07/21/17 0332 07/22/17 0439  HGB 11.1* 9.1* 8.5*    Recent Labs  07/21/17 0332 07/22/17 0439  WBC 10.3 10.7*  RBC 2.93* 2.74*  HCT 28.0* 26.1*  PLT 343 312    Recent Labs  07/21/17 0332 07/22/17 0439  NA 138 138  K 4.6 4.0  CL 107 104  CO2 23 26  BUN 31* 25*  CREATININE 1.39* 1.33*  GLUCOSE 127* 107*  CALCIUM 8.4* 8.2*    Recent Labs  07/20/17 1221  INR 1.14    EXAM General - Patient is Alert and Oriented Extremity - Neurologically intact Intact pulses distally No cellulitis present Dressing/Incision - clean, dry, no drainage Motor Function - intact, moving hand an fingers well on exam.   Past Medical History:  Diagnosis Date  . AAA (abdominal aortic aneurysm) without rupture ([place]) 06/03/2017  . Arthritis   . Atrial fibrillation with rapid ventricular response ([place]) 04/2017   New diagnosis during admission for urosepsis secondary to left nephrolithiasis/ureteral stone with hydronephrosis  . Diabetes mellitus without complication ([place])   . Dysrhythmia    a fib  . Essential  hypertension   . Hard of hearing   . History of CVA (cerebrovascular accident) without residual deficits 05/2009   Admitted with expressive aphasia & confusion following a neurocardiogenic syncope event (related to dehydration, AKD & BB related hypotension/bradycardia)  . History of kidney stones   . Hyperlipidemia   . Left nephrolithiasis 04/2017   s/p L Ureteral stent for L hydronephrosis & UTI/Urosepsis.    . Myocardial infarction ([place])   . Stroke ([place])    mild stroke no residuals    Assessment/Plan: 2 Days Post-Op Procedure(s) (LRB): REVERSE RIGHT SHOULDER ARTHROPLASTY (Right) Principal Problem:   Closed fracture of right proximal humerus Active Problems:   S/P reverse total shoulder arthroplasty, right  Estimated body mass index is 28.22 kg/m as calculated from the following:   Height as of 07/19/17: 6' (1.829 m).   Weight as of this encounter: 94.4 kg (208 lb 1.6 oz). Advance diet Up with therapy Discharge home with home health OT  Continue with therapy today. If he meets goals, plan for DC home today. If PT/OT goals not met, hold DC until Monday.   Ardeen Jourdain, PA-C Orthopaedic Surgery 07/22/2017, 8:18 AM

## 2017-07-22 NOTE — Progress Notes (Addendum)
Subjective: 2 Days Post-Op Procedure(s) (LRB): REVERSE RIGHT SHOULDER ARTHROPLASTY (Right) Patient reports pain as mild.   Patient is well, and has had no acute complaints or problems other than soreness in his right shoulder. Denies SOB and chest pain. No issues overnight. Voiding well. Positive flatus.    Objective: Vital signs in last 24 hours: Temp:  [98.3 F (36.8 C)-99 F (37.2 C)] 98.8 F (37.1 C) (08/12 0300) Pulse Rate:  [100-105] 105 (08/12 0300) Resp:  [18] 18 (08/12 0300) BP: (101-119)/(66-71) 119/71 (08/12 0300) SpO2:  [96 %-97 %] 97 % (08/12 0300)  Intake/Output from previous day:  Intake/Output Summary (Last 24 hours) at 07/22/17 1258 Last data filed at 07/22/17 0900  Gross per 24 hour  Intake              240 ml  Output                0 ml  Net              240 ml     Labs:  Recent Labs  07/19/17 1432 07/21/17 0332 07/22/17 0439  HGB 11.1* 9.1* 8.5*    Recent Labs  07/21/17 0332 07/22/17 0439  WBC 10.3 10.7*  RBC 2.93* 2.74*  HCT 28.0* 26.1*  PLT 343 312    Recent Labs  07/21/17 0332 07/22/17 0439  NA 138 138  K 4.6 4.0  CL 107 104  CO2 23 26  BUN 31* 25*  CREATININE 1.39* 1.33*  GLUCOSE 127* 107*  CALCIUM 8.4* 8.2*    Recent Labs  07/20/17 1221  INR 1.14    EXAM General - Patient is Alert and Oriented Extremity - Neurologically intact, SILT ax/MC/LABC/med/rad/uln Intact pulses distally No cellulitis present Dressing/Incision - clean, dry, no drainage Motor Function - intact, moving hand an fingers well on exam.   Past Medical History:  Diagnosis Date  . AAA (abdominal aortic aneurysm) without rupture ([place]) 06/03/2017  . Arthritis   . Atrial fibrillation with rapid ventricular response ([place]) 04/2017   New diagnosis during admission for urosepsis secondary to left nephrolithiasis/ureteral stone with hydronephrosis  . Diabetes mellitus without complication ([place])   . Dysrhythmia    a fib  . Essential hypertension     . Hard of hearing   . History of CVA (cerebrovascular accident) without residual deficits 05/2009   Admitted with expressive aphasia & confusion following a neurocardiogenic syncope event (related to dehydration, AKD & BB related hypotension/bradycardia)  . History of kidney stones   . Hyperlipidemia   . Left nephrolithiasis 04/2017   s/p L Ureteral stent for L hydronephrosis & UTI/Urosepsis.    . Myocardial infarction ([place])   . Stroke ([place])    mild stroke no residuals    Assessment/Plan: 2 Days Post-Op Procedure(s) (LRB): REVERSE RIGHT SHOULDER ARTHROPLASTY (Right) Principal Problem:   Closed fracture of right proximal humerus Active Problems:   S/P reverse total shoulder arthroplasty, right  Estimated body mass index is 28.22 kg/m as calculated from the following:   Height as of 07/19/17: 6' (1.829 m).   Weight as of this encounter: 94.4 kg (208 lb 1.6 oz). Advance diet Up with therapy Discharge home with home health OT  Eliquis , with SCDs for DVT ppx  Stable at this time with acute blood loss anemia  SSI for DM  CKD at baseline Cr  Continue with therapy today. If he meets goals, plan for DC home today. If PT/OT goals not met, hold DC  until Monday.   Victorino December, MD Orthopaedic Surgery 07/22/2017, 12:58 PM

## 2017-07-22 NOTE — Progress Notes (Addendum)
    Subjective: 2 Days Post-Op Procedure(s) (LRB): REVERSE RIGHT SHOULDER ARTHROPLASTY (Right) Patient reports pain as 5 on 0-10 scale.   Denies CP or SOB.  Voiding without difficulty. Positive flatus. Objective: Vital signs in last 24 hours: Temp:  [98.3 F (36.8 C)-99 F (37.2 C)] 98.8 F (37.1 C) (08/12 0300) Pulse Rate:  [100-105] 105 (08/12 0300) Resp:  [18] 18 (08/12 0300) BP: (101-119)/(66-71) 119/71 (08/12 0300) SpO2:  [96 %-97 %] 97 % (08/12 0300)  Intake/Output from previous day: 08/11 0701 - 08/12 0700 In: 420 [P.O.:420] Out: -  Intake/Output this shift: Total I/O In: 240 [P.O.:240] Out: -   Labs:    Recent Labs  07/21/17 0332   NA 138   K 4.6   CL 107   CO2 23   BUN 31*   CREATININE 1.39*   GLUCOSE 127*   CALCIUM 8.4*     Recent Labs  07/20/17 1221  INR 1.14    Physical Exam: Neurologically intact Neurovascular intact, SILT ax/mc/rad/med/uln/ain/pin Intact pulses distally, radial Incision: dressing C/D/I Compartment soft  Assessment/Plan: 2 Days Post-Op Procedure(s) (LRB): REVERSE RIGHT SHOULDER ARTHROPLASTY (Right) Advance diet Up with therapy Plan for discharge tomorrow  NWB RUE, sling  Eliquis , with SCDs for DVT ppx  Stable at this time with acute blood loss anemia  SSI for DM  CKD at baseline Cr  Charles KidaJason Patrick Richard  [facility] [phone] 07/22/2017, 12:55 PM

## 2017-07-22 NOTE — Progress Notes (Signed)
Physical Therapy Treatment Patient Details Name: Charles Richard MRN: [id number] DOB: [birth year] Today's Date: 07/22/2017    History of Present Illness Charles Richard is a 83 y.o. male who complains of pain of the right shoulder following a fall on 07/06/17 resulting in R proximal humerus fx. Pt is a right handed individual that is independent with his ADLs but does live with his daughter; now s/p Reverse TSA RUE;  has a past medical history of AAA (abdominal aortic aneurysm) without rupture (HCC) (06/03/2017); Arthritis; Atrial fibrillation with rapid ventricular response (HCC) (04/2017); Diabetes mellitus without complication (HCC); Dysrhythmia; Essential hypertension; Hard of hearing; History of CVA (cerebrovascular accident) without residual deficits (05/2009); Myocardial infarction [facility](HCC)    PT Comments    Pt demonstrates improved tolerance for bed mobs and gait this session with decreased c/o pain and improved mobility. Pt is able to progress to EOB with decreased assistance this session and stand briefly without UE support to adjust IV pole without assistance. Pt will benefit from continued acute PT follow-up prior to DC and HH at discharge. Pt may progress home with family assuming they are comfortable caring for the patient at current level of Min A.    Follow Up Recommendations  Home health PT;Supervision/Assistance - 24 hour     Equipment Recommendations  None recommended by PT    Recommendations for Other Services       Precautions / Restrictions Precautions Precautions: Fall;Shoulder Type of Shoulder Precautions: Conservative shoulder; AROM wrist, elbow, and hand Shoulder Interventions: Shoulder sling/immobilizer Precaution Booklet Issued: No Required Braces or Orthoses: Sling Restrictions Weight Bearing Restrictions: Yes RUE Weight Bearing: Non weight bearing    Mobility  Bed Mobility Overal bed mobility: Needs Assistance Bed Mobility: Supine to Sit     Supine to  sit: Min assist     General bed mobility comments: Min A to sit upright at EOB with HOB elevated. Pt able to transition hips to EOB. [facility]  Transfers Overall transfer level: Needs assistance Equipment used: 1 person hand held assist Transfers: Sit to/from Stand Sit to Stand: Min assist         General transfer comment: MIn A for safety from EOB to standing. Improved stability in standing  Ambulation/Gait Ambulation/Gait assistance: Min assist Ambulation Distance (Feet): 20 Feet Assistive device: 1 person hand held assist Gait Pattern/deviations: Shuffle Gait velocity: decreased Gait velocity interpretation: Below normal speed for age/gender General Gait Details: improved sequencing and tolerance for short distance gait. Improved stability in standing.    Stairs            Wheelchair Mobility    Modified Rankin (Stroke Patients Only)       Balance Overall balance assessment: Needs assistance Sitting-balance support: No upper extremity supported;Feet supported Sitting balance-Leahy Scale: Fair     Standing balance support: Single extremity supported;During functional activity Standing balance-Leahy Scale: Poor Standing balance comment: Reliant on UE support                            Cognition Arousal/Alertness: Awake/alert Behavior During Therapy: WFL for tasks assessed/performed Overall Cognitive Status: Within Functional Limits for tasks assessed                                        Exercises      General Comments General comments (  skin integrity, edema, etc.): No symptoms of orthostatics this session      Pertinent Vitals/Pain Pain Assessment: 0-10 Pain Score: 4  Pain Location: R shoulder Pain Descriptors / Indicators: Aching Pain Intervention(s): Premedicated before session;Monitored during session;Repositioned;Ice applied    Home Living                      Prior Function             PT Goals (current goals can now be found in the care plan section) Acute Rehab PT Goals Patient Stated Goal: to get home Progress towards PT goals: Progressing toward goals    Frequency    Min 5X/week      PT Plan Current plan remains appropriate    Co-evaluation              AM-PAC PT "6 Clicks" Daily Activity  Outcome Measure  Difficulty turning over in bed (including adjusting bedclothes, sheets and blankets)?: Total Difficulty moving from lying on back to sitting on the side of the bed? : Total Difficulty sitting down on and standing up from a chair with arms (e.g., wheelchair, bedside commode, etc,.)?: Total Help needed moving to and from a bed to chair (including a wheelchair)?: A Little Help needed walking in hospital room?: A Little Help needed climbing 3-5 steps with a railing? : A Lot 6 Click Score: 11    End of Session Equipment Utilized During Treatment: Gait belt Activity Tolerance: Patient tolerated treatment well Patient left: in chair;with call bell/phone within reach Nurse Communication: Mobility status PT Visit Diagnosis: Unsteadiness on feet (R26.81);Other abnormalities of gait and mobility (R26.89);Pain Pain - Right/Left: Right Pain - part of body: Shoulder     Time: 4098-1191 PT Time Calculation (min) (ACUTE ONLY): 19 min  Charges:                       G Codes:       Charles Richard PT, DPT  [phone]    Charles Richard 07/22/2017, 10:04 AM

## 2017-07-23 ENCOUNTER — Encounter (HOSPITAL_COMMUNITY): Payer: Self-pay | Admitting: Orthopedic Surgery

## 2017-07-24 NOTE — Discharge Summary (Signed)
Patient ID: Charles Richard MRN: [id number] DOB/AGE: Sep 06, 1930 83 y.o.  Admit date: 07/20/2017 Discharge date: 07/22/2017  Primary Diagnosis:  Closed fracture of right proximal humerus    Admission Diagnoses:  Past Medical History:  Diagnosis Date  . AAA (abdominal aortic aneurysm) without rupture (Charles Richard) 06/03/2017  . Arthritis   . Atrial fibrillation with rapid ventricular response ([facility]) 04/2017   New diagnosis during admission for urosepsis secondary to left nephrolithiasis/ureteral stone with hydronephrosis  . Diabetes mellitus without complication ([facility])   . Dysrhythmia    a fib  . Essential hypertension   . Hard of hearing   . History of CVA (cerebrovascular accident) without residual deficits 05/2009   Admitted with expressive aphasia & confusion following a neurocardiogenic syncope event (related to dehydration, AKD & BB related hypotension/bradycardia)  . History of kidney stones   . Hyperlipidemia   . Left nephrolithiasis 04/2017   s/p L Ureteral stent for L hydronephrosis & UTI/Urosepsis.    . Myocardial infarction ([facility])   . Stroke [facility])    mild stroke no residuals   Discharge Diagnoses:   Principal Problem:   Closed fracture of right proximal humerus Active Problems:   S/P reverse total shoulder arthroplasty, right  Estimated body mass index is 28.22 kg/m as calculated from the following:   Height as of 07/19/17: 6' (1.829 m).   Weight as of this encounter: 94.4 kg (208 lb 1.6 oz).  Procedure:  Procedure(s) (LRB): REVERSE RIGHT SHOULDER ARTHROPLASTY (Right)   Consults: None  HPI: Charles Richard is a 83 y.o. male who complains of pain of the right shoulder following a fall on 07/06/17.  [name] is a right handed individual that is independent with his ADLs but does live with his daughter and presented for follow up of his right proximal humerus fracture this week in our office with my partner Dr. Onnie Graham.  He recommended reverse shoulder arthroplasty  for the most reliable outcome.  Unfortunately he was not able to get this done in a timely manner and asked for my assistance in definitive care.  We met this week in my office and I recommended moving forward this week with reverse TSA for the comminuted and displaced right proximal humerus fracture, and he is here today for that surgery.  He has multiple medical problems including DM and aFib, he has not taken his eluiqis since Tuesday.  He denies any numbness or paresthesias of the right arm.  Laboratory Data: Admission on 07/20/2017, Discharged on 07/22/2017  Component Date Value Ref Range Status  . Prothrombin Time 07/20/2017 14.7  11.4 - 15.2 seconds Final  . INR 07/20/2017 1.14   Final  . Glucose-Capillary 07/20/2017 150* 65 - 99 mg/dL Final  . Comment 1 07/20/2017 Notify RN   Final  . Comment 2 07/20/2017 Document in Chart   Final  . Glucose-Capillary 07/20/2017 110* 65 - 99 mg/dL Final  . Comment 1 07/20/2017 Notify RN   Final  . Comment 2 07/20/2017 Document in Chart   Final  . WBC 07/21/2017 10.3  4.0 - 10.5 K/uL Final  . RBC 07/21/2017 2.93* 4.22 - 5.81 MIL/uL Final  . Hemoglobin 07/21/2017 9.1* 13.0 - 17.0 g/dL Final  . HCT 07/21/2017 28.0* 39.0 - 52.0 % Final  . MCV 07/21/2017 95.6  78.0 - 100.0 fL Final  . MCH 07/21/2017 31.1  26.0 - 34.0 pg Final  . MCHC 07/21/2017 32.5  30.0 - 36.0 g/dL Final  . RDW 07/21/2017 14.4  11.5 - 15.5 % Final  . Platelets 07/21/2017 343  150 - 400 K/uL Final  . Sodium 07/21/2017 138  135 - 145 mmol/L Final  . Potassium 07/21/2017 4.6  3.5 - 5.1 mmol/L Final  . Chloride 07/21/2017 107  101 - 111 mmol/L Final  . CO2 07/21/2017 23  22 - 32 mmol/L Final  . Glucose, Bld 07/21/2017 127* 65 - 99 mg/dL Final  . BUN 07/21/2017 31* 6 - 20 mg/dL Final  . Creatinine, Ser 07/21/2017 1.39* 0.61 - 1.24 mg/dL Final  . Calcium 07/21/2017 8.4* 8.9 - 10.3 mg/dL Final  . GFR calc non Af Amer 07/21/2017 45* >60 mL/min Final  . GFR calc Af Amer 07/21/2017 52*  >60 mL/min Final   Comment: (NOTE) The eGFR has been calculated using the CKD EPI equation. This calculation has not been validated in all clinical situations. eGFR's persistently <60 mL/min signify possible Chronic Kidney Disease.   . Anion gap 07/21/2017 8  5 - 15 Final  . Glucose-Capillary 07/20/2017 130* 65 - 99 mg/dL Final  . Glucose-Capillary 07/21/2017 107* 65 - 99 mg/dL Final  . Glucose-Capillary 07/21/2017 190* 65 - 99 mg/dL Final  . Glucose-Capillary 07/21/2017 162* 65 - 99 mg/dL Final  . WBC 07/22/2017 10.7* 4.0 - 10.5 K/uL Final  . RBC 07/22/2017 2.74* 4.22 - 5.81 MIL/uL Final  . Hemoglobin 07/22/2017 8.5* 13.0 - 17.0 g/dL Final  . HCT 07/22/2017 26.1* 39.0 - 52.0 % Final  . MCV 07/22/2017 95.3  78.0 - 100.0 fL Final  . MCH 07/22/2017 31.0  26.0 - 34.0 pg Final  . MCHC 07/22/2017 32.6  30.0 - 36.0 g/dL Final  . RDW 07/22/2017 14.5  11.5 - 15.5 % Final  . Platelets 07/22/2017 312  150 - 400 K/uL Final  . Sodium 07/22/2017 138  135 - 145 mmol/L Final  . Potassium 07/22/2017 4.0  3.5 - 5.1 mmol/L Final  . Chloride 07/22/2017 104  101 - 111 mmol/L Final  . CO2 07/22/2017 26  22 - 32 mmol/L Final  . Glucose, Bld 07/22/2017 107* 65 - 99 mg/dL Final  . BUN 07/22/2017 25* 6 - 20 mg/dL Final  . Creatinine, Ser 07/22/2017 1.33* 0.61 - 1.24 mg/dL Final  . Calcium 07/22/2017 8.2* 8.9 - 10.3 mg/dL Final  . GFR calc non Af Amer 07/22/2017 47* >60 mL/min Final  . GFR calc Af Amer 07/22/2017 55* >60 mL/min Final   Comment: (NOTE) The eGFR has been calculated using the CKD EPI equation. This calculation has not been validated in all clinical situations. eGFR's persistently <60 mL/min signify possible Chronic Kidney Disease.   . Anion gap 07/22/2017 8  5 - 15 Final  . Glucose-Capillary 07/21/2017 113* 65 - 99 mg/dL Final  . Glucose-Capillary 07/22/2017 102* 65 - 99 mg/dL Final  . Glucose-Capillary 07/22/2017 145* 65 - 99 mg/dL Final  Hospital Outpatient Visit on 07/19/2017    Component Date Value Ref Range Status  . Glucose-Capillary 07/19/2017 158* 65 - 99 mg/dL Final  . WBC 07/19/2017 11.7* 4.0 - 10.5 K/uL Final  . RBC 07/19/2017 3.53* 4.22 - 5.81 MIL/uL Final  . Hemoglobin 07/19/2017 11.1* 13.0 - 17.0 g/dL Final  . HCT 07/19/2017 33.3* 39.0 - 52.0 % Final  . MCV 07/19/2017 94.3  78.0 - 100.0 fL Final  . MCH 07/19/2017 31.4  26.0 - 34.0 pg Final  . MCHC 07/19/2017 33.3  30.0 - 36.0 g/dL Final  . RDW 07/19/2017 14.3  11.5 - 15.5 % Final  .  Platelets 07/19/2017 429* 150 - 400 K/uL Final  . Sodium 07/19/2017 136  135 - 145 mmol/L Final  . Potassium 07/19/2017 4.8  3.5 - 5.1 mmol/L Final  . Chloride 07/19/2017 103  101 - 111 mmol/L Final  . CO2 07/19/2017 21* 22 - 32 mmol/L Final  . Glucose, Bld 07/19/2017 166* 65 - 99 mg/dL Final  . BUN 07/19/2017 46* 6 - 20 mg/dL Final  . Creatinine, Ser 07/19/2017 1.85* 0.61 - 1.24 mg/dL Final  . Calcium 07/19/2017 9.0  8.9 - 10.3 mg/dL Final  . GFR calc non Af Amer 07/19/2017 32* >60 mL/min Final  . GFR calc Af Amer 07/19/2017 37* >60 mL/min Final   Comment: (NOTE) The eGFR has been calculated using the CKD EPI equation. This calculation has not been validated in all clinical situations. eGFR's persistently <60 mL/min signify possible Chronic Kidney Disease.   . Anion gap 07/19/2017 12  5 - 15 Final  . MRSA, PCR 07/19/2017 NEGATIVE  NEGATIVE Final  . Staphylococcus aureus 07/19/2017 NEGATIVE  NEGATIVE Final   Comment:        The Xpert SA Assay (FDA approved for NASAL specimens in patients over 35 years of age), is one component of a comprehensive surveillance program.  Test performance has been validated by [facility] for patients greater than or equal to 71 year old. It is not intended to diagnose infection nor to guide or monitor treatment.   Admission on 06/08/2017, Discharged on 06/08/2017  Component Date Value Ref Range Status  . Glucose-Capillary 06/08/2017 142* 65 - 99 mg/dL Final  . Comment 1  06/08/2017 Notify RN   Final  . Glucose-Capillary 06/08/2017 118* 65 - 99 mg/dL Final  . Comment 1 06/08/2017 Notify RN   Final  . Comment 2 06/08/2017 Document in Chart   Final  Hospital Outpatient Visit on 06/05/2017  Component Date Value Ref Range Status  . Hgb A1c MFr Bld 06/05/2017 6.1* 4.8 - 5.6 % Final   Comment: (NOTE)         Pre-diabetes: 5.7 - 6.4         Diabetes: >6.4         Glycemic control for adults with diabetes: <7.0   . Mean Plasma Glucose 06/05/2017 128  mg/dL Final   Comment: (NOTE) Performed At: [facility] [place], [place] [id number] Lindon Romp MD VH:[id number]   . Sodium 06/05/2017 144  135 - 145 mmol/L Final  . Potassium 06/05/2017 5.5* 3.5 - 5.1 mmol/L Final  . Chloride 06/05/2017 113* 101 - 111 mmol/L Final  . CO2 06/05/2017 23  22 - 32 mmol/L Final  . Glucose, Bld 06/05/2017 161* 65 - 99 mg/dL Final  . BUN 06/05/2017 33* 6 - 20 mg/dL Final  . Creatinine, Ser 06/05/2017 1.76* 0.61 - 1.24 mg/dL Final  . Calcium 06/05/2017 9.6  8.9 - 10.3 mg/dL Final  . GFR calc non Af Amer 06/05/2017 34* >60 mL/min Final  . GFR calc Af Amer 06/05/2017 39* >60 mL/min Final   Comment: (NOTE) The eGFR has been calculated using the CKD EPI equation. This calculation has not been validated in all clinical situations. eGFR's persistently <60 mL/min signify possible Chronic Kidney Disease.   . Anion gap 06/05/2017 8  5 - 15 Final  . WBC 06/05/2017 11.7* 4.0 - 10.5 K/uL Final  . RBC 06/05/2017 4.44  4.22 - 5.81 MIL/uL Final  . Hemoglobin 06/05/2017 14.2  13.0 - 17.0 g/dL Final  . HCT  06/05/2017 42.4  39.0 - 52.0 % Final  . MCV 06/05/2017 95.5  78.0 - 100.0 fL Final  . MCH 06/05/2017 32.0  26.0 - 34.0 pg Final  . MCHC 06/05/2017 33.5  30.0 - 36.0 g/dL Final  . RDW 06/05/2017 12.9  11.5 - 15.5 % Final  . Platelets 06/05/2017 256  150 - 400 K/uL Final  . Glucose-Capillary 06/05/2017 186* 65 - 99 mg/dL Final     X-Rays:Dg Shoulder  Right  Result Date: 07/06/2017 CLINICAL DATA:  Patient fell, striking the right shoulder. Deformity and swelling is noted. EXAM: RIGHT SHOULDER - 2+ VIEW; RIGHT HUMERUS - 2+ VIEW COMPARISON:  None. FINDINGS: Comminuted fractures of the surgical neck of the right humerus with impaction of fracture fragments, mild medial displacement of the distal fracture fragment, and lateral angulation of the distal fracture fragment. Fracture line extends into the humeral head with small lesser trochanteric fragments suggested. No extension to the articular surface. Coracoclavicular and acromioclavicular spaces are maintained. Distal humerus appears intact. Soft tissue swelling. IMPRESSION: Comminuted fractures of the right humeral head and neck as described. Electronically Signed   By: Lucienne Capers M.D.   On: 07/06/2017 01:51   Ct Head Wo Contrast  Result Date: 07/06/2017 CLINICAL DATA:  Patient fell down stairs. No head injury. History of stroke, hypertension, diabetes. EXAM: CT HEAD WITHOUT CONTRAST CT CERVICAL SPINE WITHOUT CONTRAST TECHNIQUE: Multidetector CT imaging of the head and cervical spine was performed following the standard protocol without intravenous contrast. Multiplanar CT image reconstructions of the cervical spine were also generated. COMPARISON:  MRI brain 05/18/2009.  CT head 05/17/2009. FINDINGS: CT HEAD FINDINGS Brain: Diffuse cerebral atrophy. Low-attenuation changes in the deep white matter consistent small vessel ischemia. No evidence of acute infarction, hemorrhage, hydrocephalus, extra-axial collection or mass lesion/mass effect. Vascular: Tortuous and calcified intracranial arteries. Skull: Normal. Negative for fracture or focal lesion. Sinuses/Orbits: No acute finding. Other: None. CT CERVICAL SPINE FINDINGS Alignment: Normal. Skull base and vertebrae: No acute fracture. No primary bone lesion or focal pathologic process. Soft tissues and spinal canal: No prevertebral fluid or swelling. No  visible canal hematoma. Disc levels: Degenerative changes throughout the cervical spine with narrowed interspaces and endplate hypertrophic changes. Degenerative changes are most prominent at C4-5 and C5-6 levels. Degenerative changes throughout the facet joints. Upper chest: Mild emphysematous changes in the lung apices. Other: Vascular calcifications in the cervical carotid arteries. IMPRESSION: 1. No acute intracranial abnormalities. Chronic atrophy and small vessel ischemic changes. 2. No acute displaced cervical spine fractures identified. Degenerative changes are present. 3. Atherosclerotic vascular disease. Electronically Signed   By: Lucienne Capers M.D.   On: 07/06/2017 02:04   Ct Cervical Spine Wo Contrast  Result Date: 07/06/2017 CLINICAL DATA:  Patient fell down stairs. No head injury. History of stroke, hypertension, diabetes. EXAM: CT HEAD WITHOUT CONTRAST CT CERVICAL SPINE WITHOUT CONTRAST TECHNIQUE: Multidetector CT imaging of the head and cervical spine was performed following the standard protocol without intravenous contrast. Multiplanar CT image reconstructions of the cervical spine were also generated. COMPARISON:  MRI brain 05/18/2009.  CT head 05/17/2009. FINDINGS: CT HEAD FINDINGS Brain: Diffuse cerebral atrophy. Low-attenuation changes in the deep white matter consistent small vessel ischemia. No evidence of acute infarction, hemorrhage, hydrocephalus, extra-axial collection or mass lesion/mass effect. Vascular: Tortuous and calcified intracranial arteries. Skull: Normal. Negative for fracture or focal lesion. Sinuses/Orbits: No acute finding. Other: None. CT CERVICAL SPINE FINDINGS Alignment: Normal. Skull base and vertebrae: No acute fracture. No primary bone lesion  or focal pathologic process. Soft tissues and spinal canal: No prevertebral fluid or swelling. No visible canal hematoma. Disc levels: Degenerative changes throughout the cervical spine with narrowed interspaces and  endplate hypertrophic changes. Degenerative changes are most prominent at C4-5 and C5-6 levels. Degenerative changes throughout the facet joints. Upper chest: Mild emphysematous changes in the lung apices. Other: Vascular calcifications in the cervical carotid arteries. IMPRESSION: 1. No acute intracranial abnormalities. Chronic atrophy and small vessel ischemic changes. 2. No acute displaced cervical spine fractures identified. Degenerative changes are present. 3. Atherosclerotic vascular disease. Electronically Signed   By: Lucienne Capers M.D.   On: 07/06/2017 02:04   Dg Shoulder Right Port  Result Date: 07/20/2017 CLINICAL DATA:  Status post reverse shoulder arthroplasty to treat proximal humeral fracture. EXAM: PORTABLE RIGHT SHOULDER COMPARISON:  07/06/2017 FINDINGS: Reverse arthroplasty of the right glenohumeral joint shows normal alignment. Hardware appears appropriately positioned. No evidence of fracture surrounding hardware. IMPRESSION: Normal alignment of right reverse shoulder arthroplasty. Electronically Signed   By: Aletta Edouard M.D.   On: 07/20/2017 20:49   Dg Humerus Right  Result Date: 07/06/2017 CLINICAL DATA:  Patient fell, striking the right shoulder. Deformity and swelling is noted. EXAM: RIGHT SHOULDER - 2+ VIEW; RIGHT HUMERUS - 2+ VIEW COMPARISON:  None. FINDINGS: Comminuted fractures of the surgical neck of the right humerus with impaction of fracture fragments, mild medial displacement of the distal fracture fragment, and lateral angulation of the distal fracture fragment. Fracture line extends into the humeral head with small lesser trochanteric fragments suggested. No extension to the articular surface. Coracoclavicular and acromioclavicular spaces are maintained. Distal humerus appears intact. Soft tissue swelling. IMPRESSION: Comminuted fractures of the right humeral head and neck as described. Electronically Signed   By: Lucienne Capers M.D.   On: 07/06/2017 01:51     EKG: Orders placed or performed in visit on 06/06/17  . EKG 12-Lead     Hospital Course: AMIRE LEAZER is a 83 y.o. who was admitted to Hospital. They were brought to the operating room on 07/20/2017 and underwent Procedure(s): REVERSE RIGHT SHOULDER ARTHROPLASTY, for fracture of right proximal humerus.  Patient tolerated the procedure well and was later transferred to the recovery room and then to the orthopaedic floor for postoperative care.  They were given PO and IV analgesics for pain control following their surgery.  They were given 24 hours of postoperative antibiotics of  Anti-infectives    Start     Dose/Rate Route Frequency Ordered Stop   07/21/17 0600  ceFAZolin (ANCEF) IVPB 2g/100 mL premix     2 g 200 mL/hr over 30 Minutes Intravenous On call to O.R. 07/20/17 1210 07/20/17 1433   07/20/17 1212  ceFAZolin (ANCEF) 2-4 GM/100ML-% IVPB    Comments:  Leandrew Koyanagi   : cabinet override      07/20/17 1212 07/20/17 1433     and started on DVT prophylaxis in the form of eluiqis as per before surgery.   PT and OT were ordered for total joint protocol.  Discharge planning consulted to help with postop disposition and equipment needs.  Patient had a good night on the evening of surgery.  They started to get up OOB with therapy on day one.  By day two, the patient had progressed with therapy and meeting their goals.  Incision was healing well.  Patient was seen in rounds and was ready to go home.  He did have acute blood loss anemia but was stable clinically and with  his vital signs, not requiring a transfusion.   Diet: Regular diet Activity:NWB Follow-up:in 2 weeks Disposition - home with home health therapy Discharged Condition: good    Allergies as of 07/22/2017   No Known Allergies     Medication List    STOP taking these medications   ondansetron 4 MG tablet Commonly known as:  ZOFRAN   oxyCODONE-acetaminophen 5-325 MG tablet Commonly known as:  PERCOCET      TAKE these medications   amiodarone 200 MG tablet Commonly known as:  PACERONE Take 1 tablet by mouth daily.   apixaban 2.5 MG Tabs tablet Commonly known as:  ELIQUIS Take 1 tablet (2.5 mg total) by mouth 2 (two) times daily.   carvedilol 6.25 MG tablet Commonly known as:  COREG Take 6.25 mg by mouth daily with breakfast.   cholecalciferol 1000 units tablet Commonly known as:  VITAMIN D Take 1,000 Units by mouth at bedtime.   docusate sodium 100 MG capsule Commonly known as:  COLACE Take 200 mg by mouth daily.   lisinopril 40 MG tablet Commonly known as:  PRINIVIL,ZESTRIL Take 0.5 tablets (20 mg total) by mouth daily with breakfast. HOLD UNTIL FOLLOW UP WITH PCP   loratadine 10 MG tablet Commonly known as:  CLARITIN Take 10 mg by mouth daily with breakfast.   meclizine 25 MG tablet Commonly known as:  ANTIVERT Take 25 mg by mouth 2 (two) times daily.   metFORMIN 1000 MG tablet Commonly known as:  GLUCOPHAGE Take 1,000 mg by mouth 2 (two) times daily with a meal.   multivitamin with minerals Tabs tablet Take 1 tablet by mouth at bedtime.   ondansetron 4 MG disintegrating tablet Commonly known as:  ZOFRAN ODT Take 1 tablet (4 mg total) by mouth every 8 (eight) hours as needed for nausea or vomiting.   oxyCODONE 5 MG immediate release tablet Commonly known as:  Oxy IR/ROXICODONE Take 1-2 tablets (5-10 mg total) by mouth every 4 (four) hours as needed for breakthrough pain.   simvastatin 20 MG tablet Commonly known as:  ZOCOR Take 10 mg by mouth daily with breakfast.   SYSTANE BALANCE 0.6 % Soln Generic drug:  Propylene Glycol Place 1-2 drops into both eyes daily as needed (for dry eyes).   traMADol 50 MG tablet Commonly known as:  ULTRAM Take 100 mg by mouth every 6 (six) hours as needed for moderate pain.      Follow-up Information    Nicholes Stairs, MD Follow up in 2 week(s).   Specialty:  Orthopedic Surgery Why:  For wound re-check Contact  information: [address] [place] [address] [phone]           Signed: Geralynn Rile, MD Orthopaedic Surgery 07/24/2017, 8:39 PM

## 2017-10-07 IMAGING — CR DG SHOULDER 2+V*R*
2 series · 2 of 2 positions shown · non-contrast
Comparison: None.

CLINICAL DATA: Patient fell, striking the right shoulder. Deformity
and swelling is noted.

EXAM:
RIGHT SHOULDER - 2+ VIEW; RIGHT HUMERUS - 2+ VIEW

[x shoulder ap right (1 of 2)]
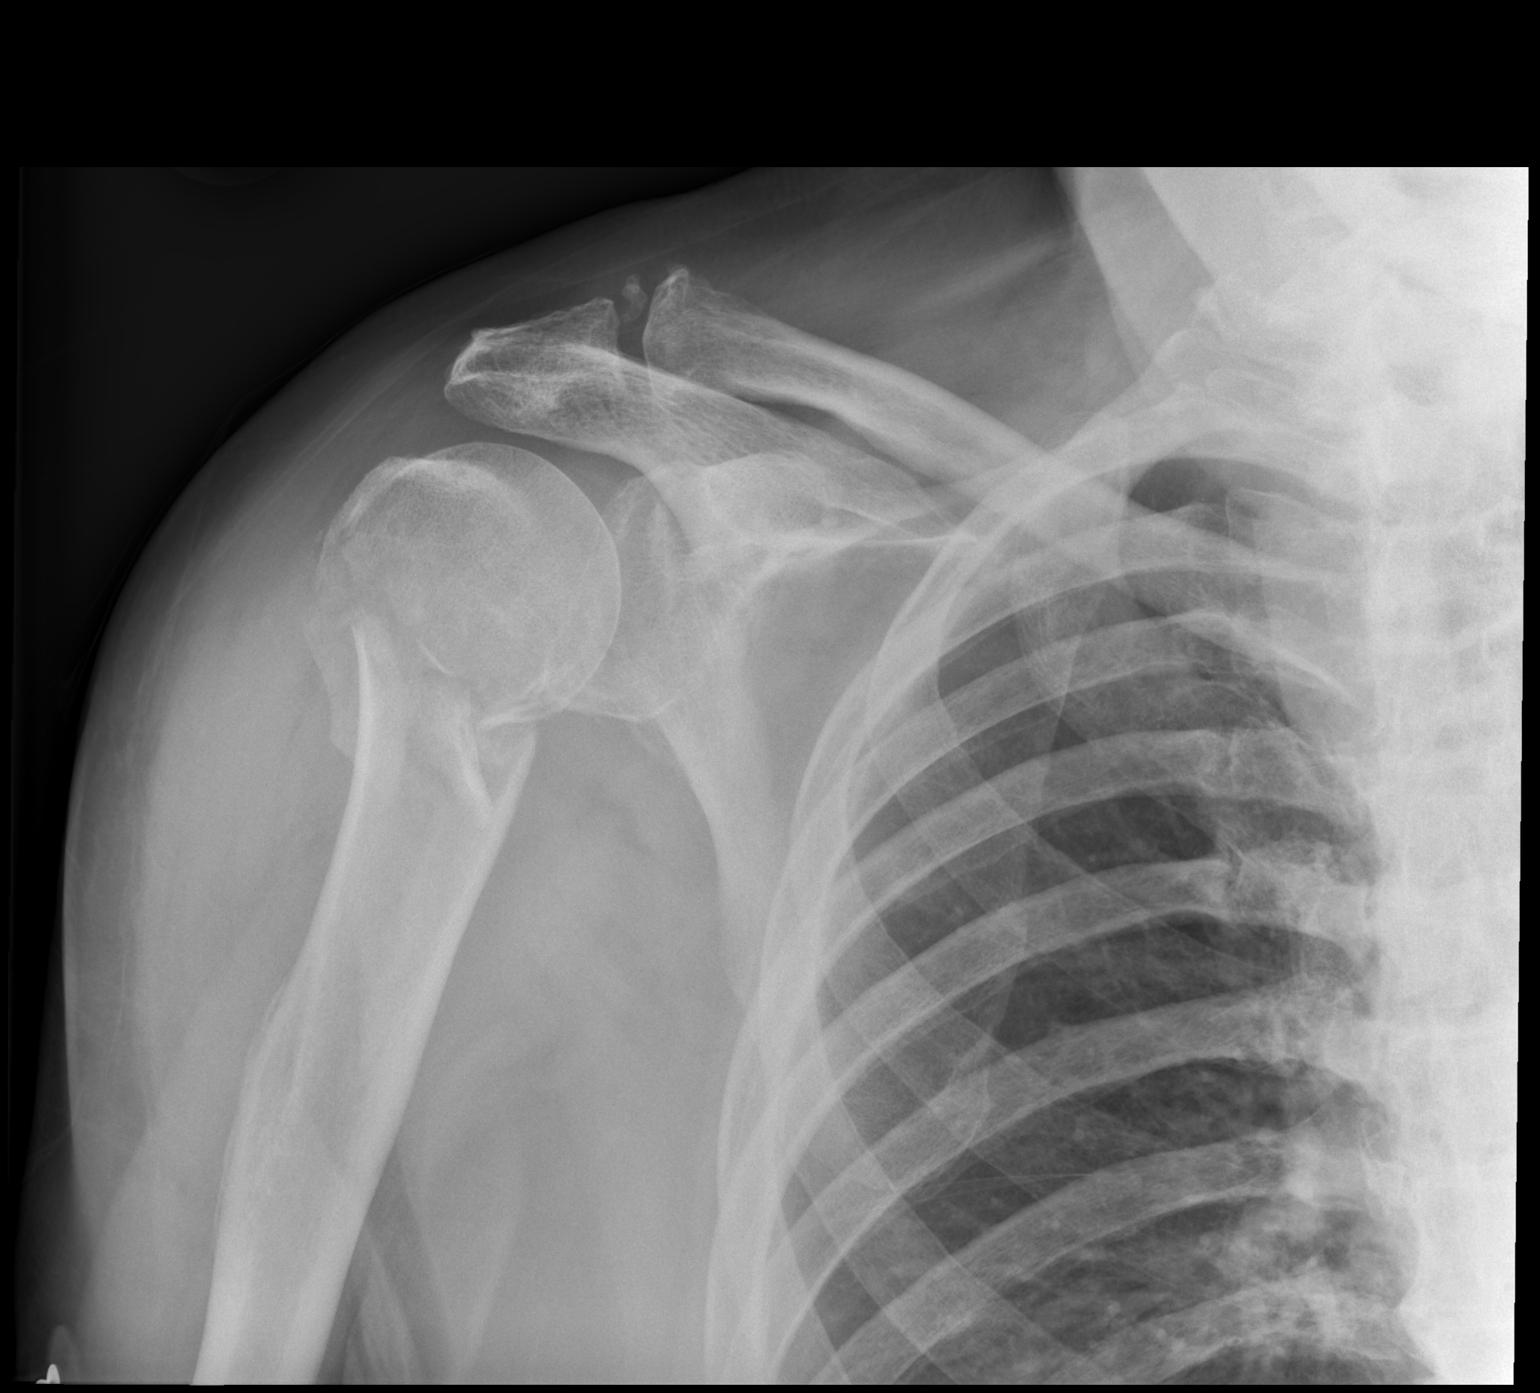

[x shoulder ap right (2 of 2)]
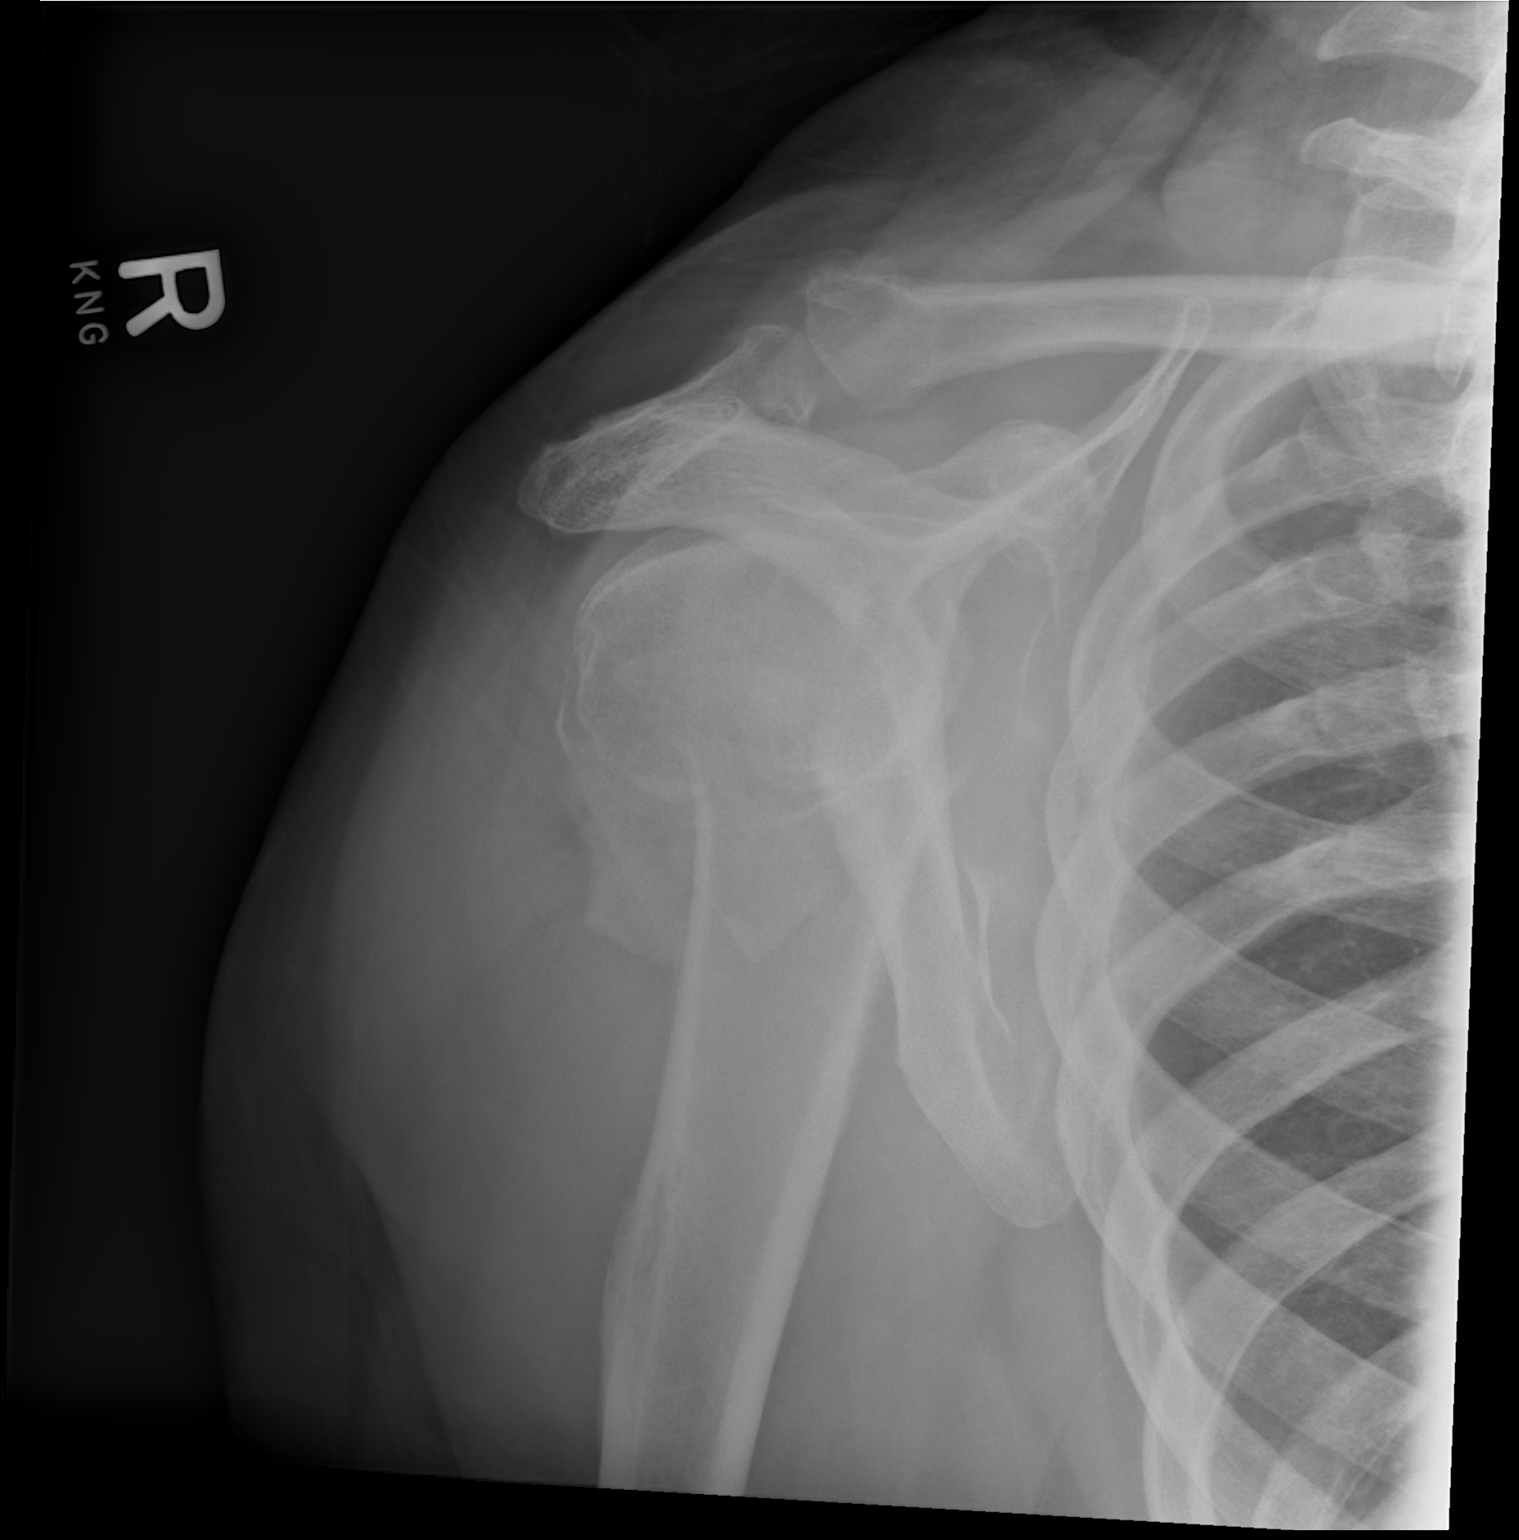

[2 of 2 positions shown; findings below may reference images not displayed]

FINDINGS: Comminuted fractures of the surgical neck of the right humerus with
impaction of fracture fragments, mild medial displacement of the
distal fracture fragment, and lateral angulation of the distal
fracture fragment. Fracture line extends into the humeral head with
small lesser trochanteric fragments suggested. No extension to the
articular surface. Coracoclavicular and acromioclavicular spaces are
maintained. Distal humerus appears intact. Soft tissue swelling.
IMPRESSION: Comminuted fractures of the right humeral head and neck as
described.

## 2017-10-07 IMAGING — CR DG HUMERUS 2V *R*
3 series · 3 of 3 positions shown · non-contrast
Comparison: None.

CLINICAL DATA: Patient fell, striking the right shoulder. Deformity
and swelling is noted.

EXAM:
RIGHT SHOULDER - 2+ VIEW; RIGHT HUMERUS - 2+ VIEW

[x humerus ap right]
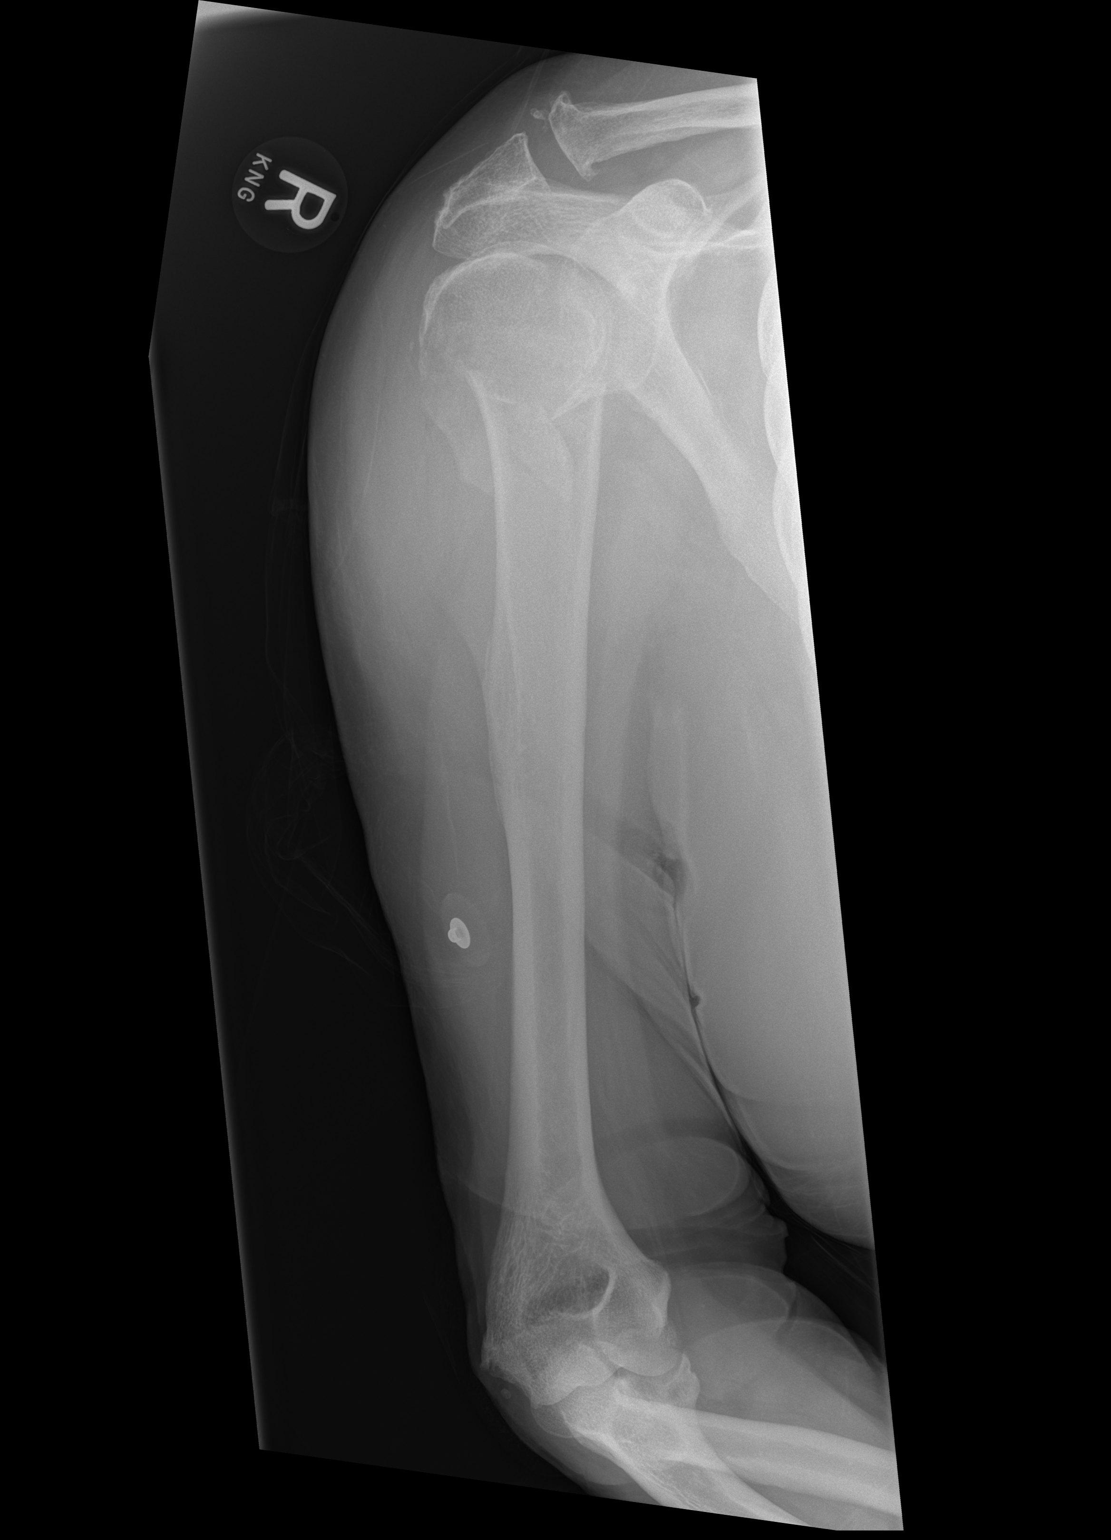

[x humerus lat right (1 of 2)]
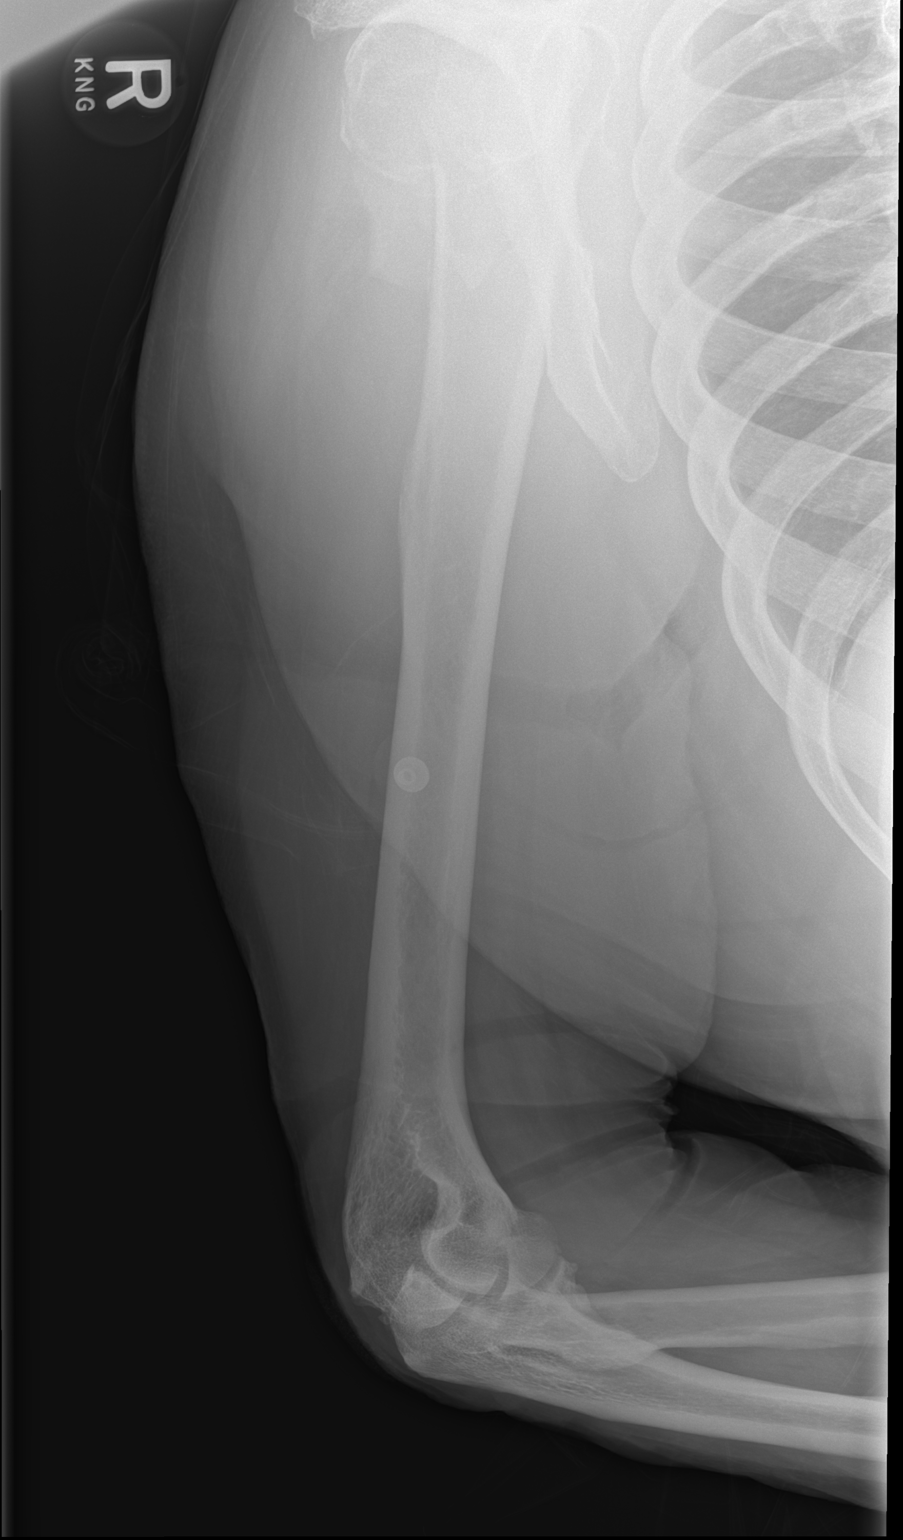

[x humerus lat right (2 of 2)]
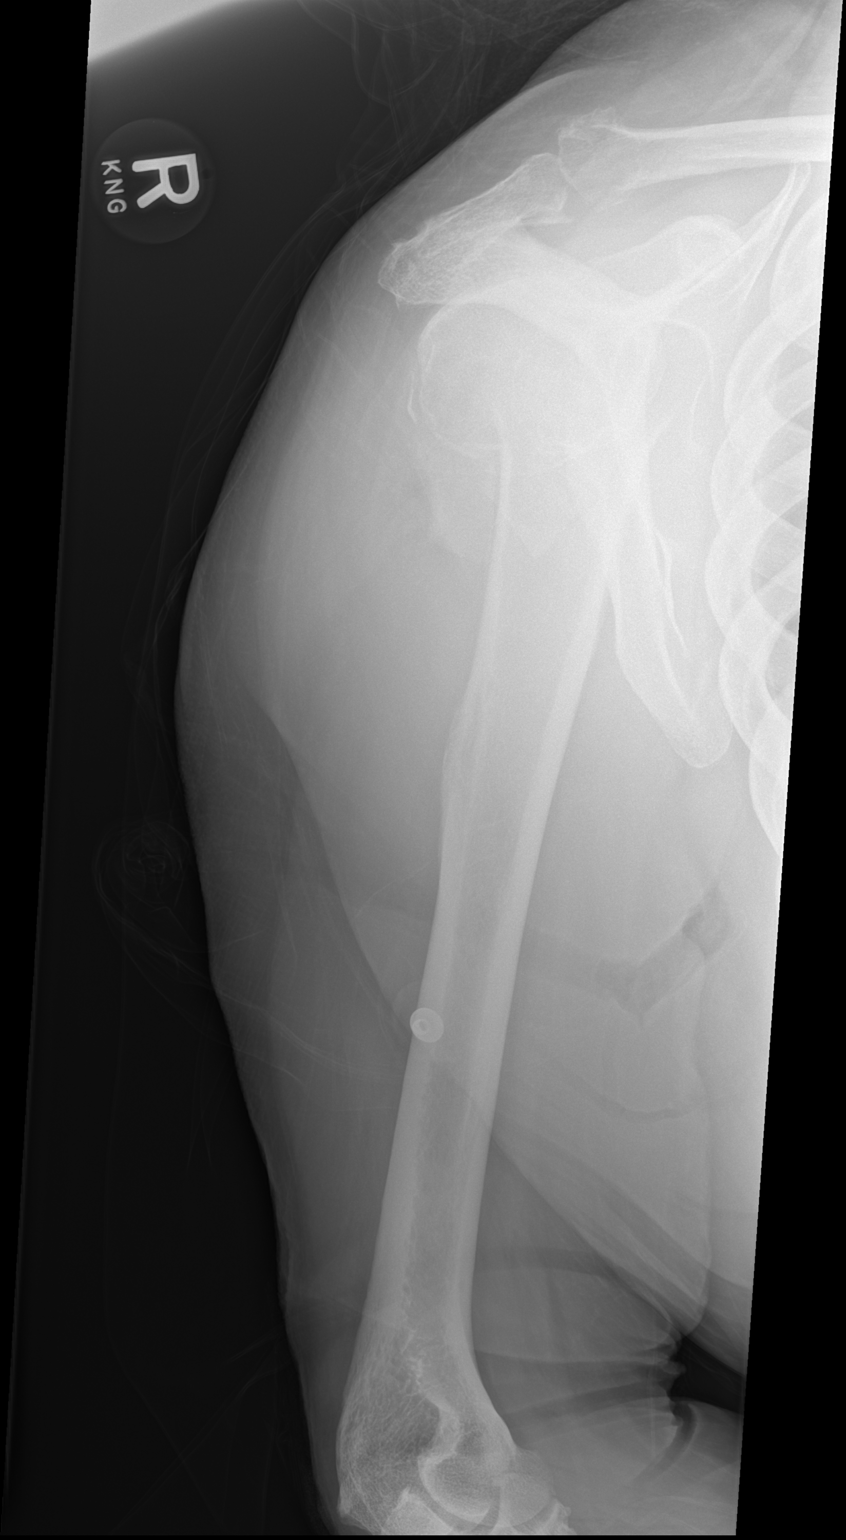

[3 of 3 positions shown; findings below may reference images not displayed]

FINDINGS: Comminuted fractures of the surgical neck of the right humerus with
impaction of fracture fragments, mild medial displacement of the
distal fracture fragment, and lateral angulation of the distal
fracture fragment. Fracture line extends into the humeral head with
small lesser trochanteric fragments suggested. No extension to the
articular surface. Coracoclavicular and acromioclavicular spaces are
maintained. Distal humerus appears intact. Soft tissue swelling.
IMPRESSION: Comminuted fractures of the right humeral head and neck as
described.

## 2017-10-07 IMAGING — CT CT CERVICAL SPINE W/O CM
4 of 8 series · 14 of 33 positions shown, 15 images · non-contrast
Comparison: MRI brain 05/18/2009.  CT head 05/17/2009.

CLINICAL DATA: Patient fell down stairs. No head injury. History of
stroke, hypertension, diabetes.

EXAM:
CT HEAD WITHOUT CONTRAST
CT CERVICAL SPINE WITHOUT CONTRAST
TECHNIQUE: Multidetector CT imaging of the head and cervical spine was
performed following the standard protocol without intravenous
contrast. Multiplanar CT image reconstructions of the cervical spine
were also generated.

[Series 5: coronal · coronal · 0.31mm/px · 3 of 65 slices shown]
[im 17/65  bone]
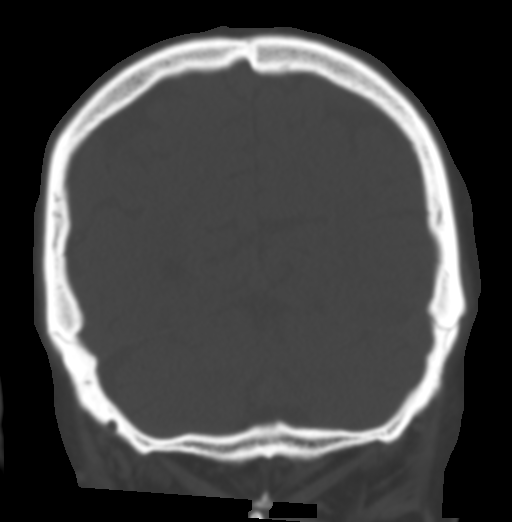
[im 33/65  bone]
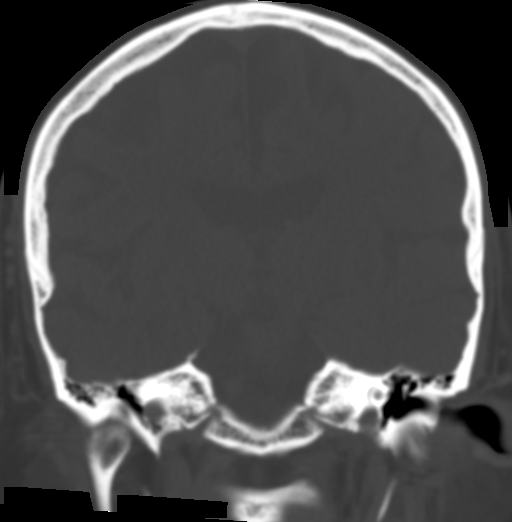
[im 49/65  bone]
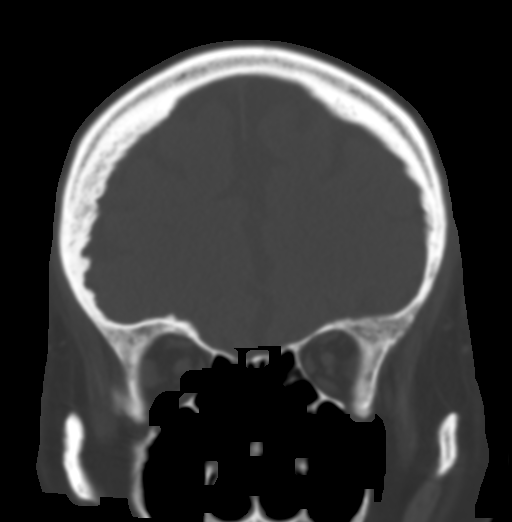

[Series 6: sagittal · sagittal · 0.31mm/px · 5 of 50 slices shown]
[im 9/50  bone]
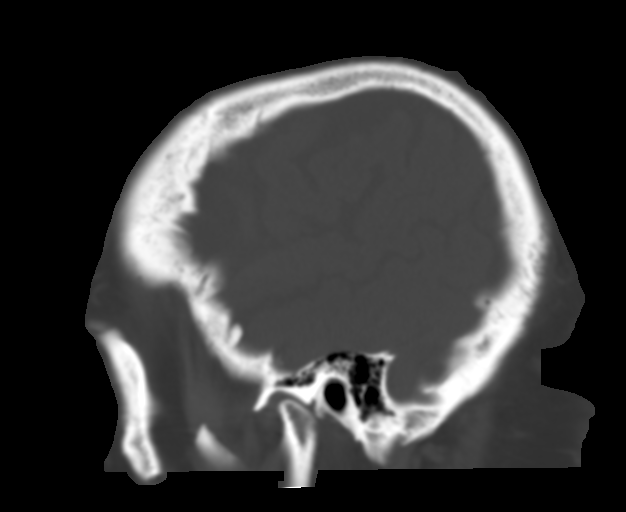
[im 17/50  bone]
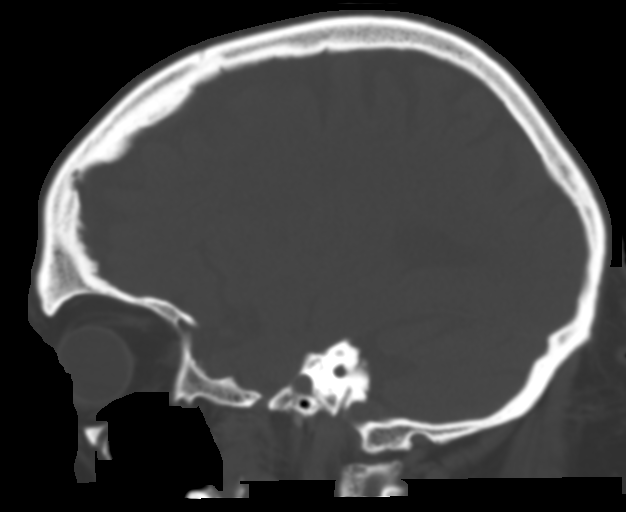
[im 25/50  bone]
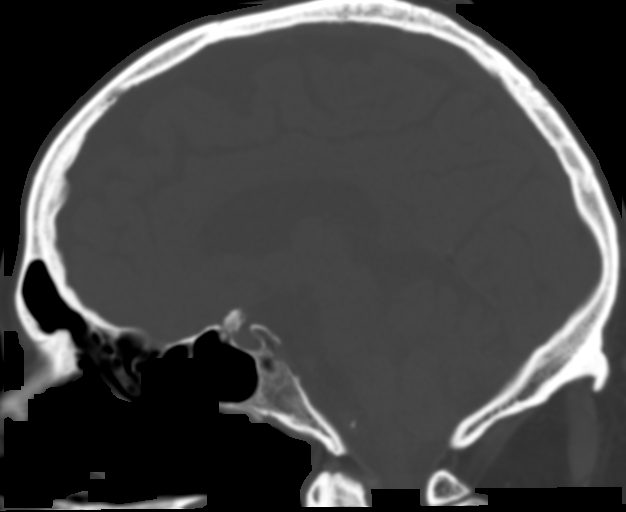
[im 33/50  bone]
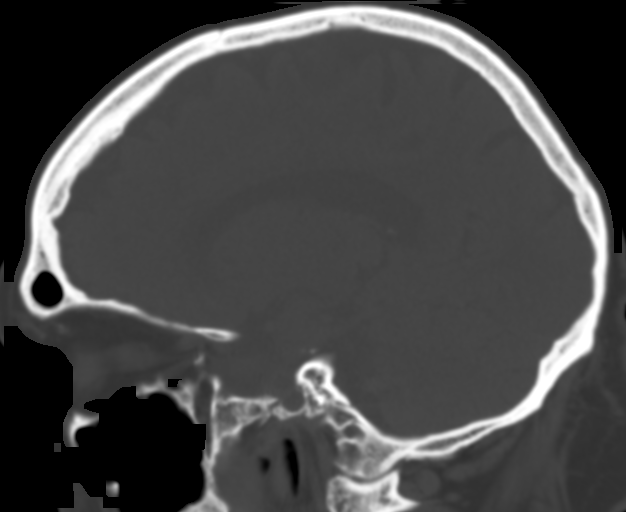
[im 41/50  bone]
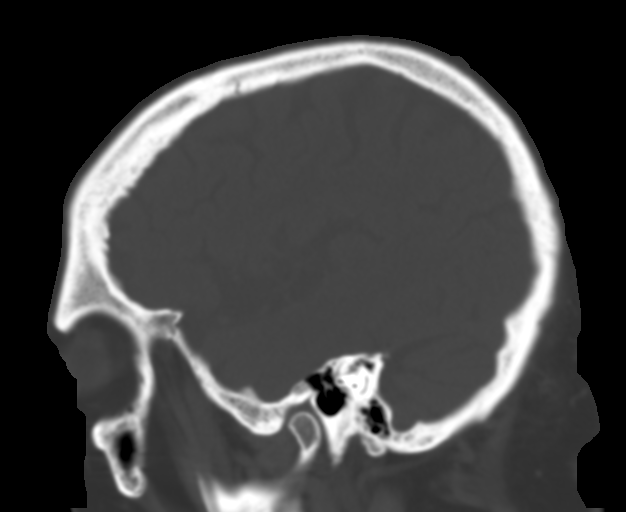

[Series 7: c-spine st · axial · 0.37mm/px · z∈[-258,-164]mm · 3 of 95 slices shown, 4 images]
[im 24/95  soft-tissue]
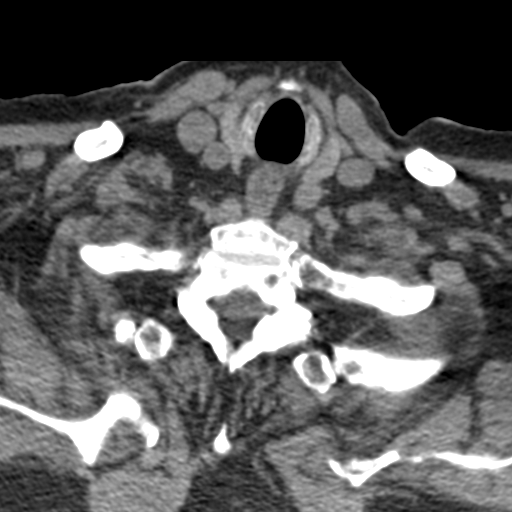
[im 24/95  bone]
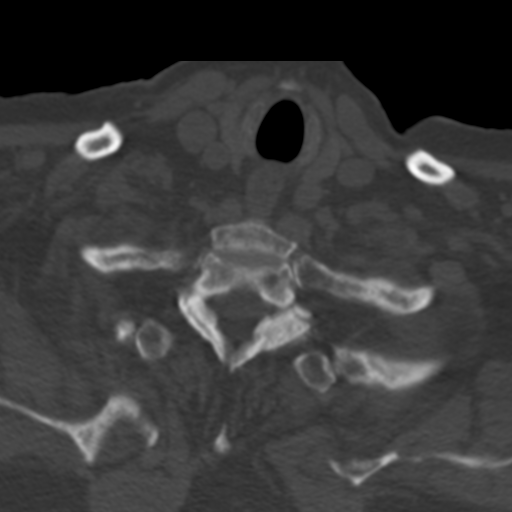
[im 48/95  bone]
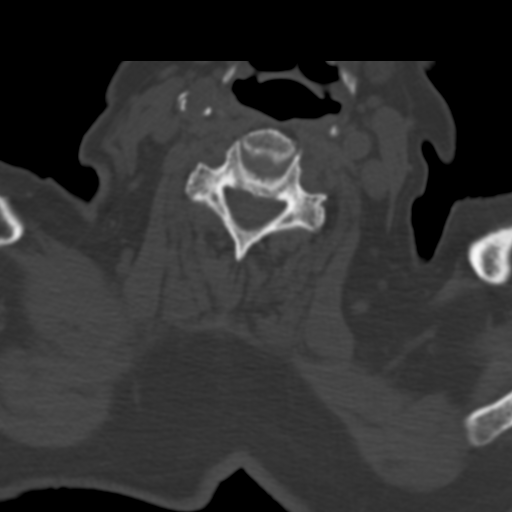
[im 71/95  bone]
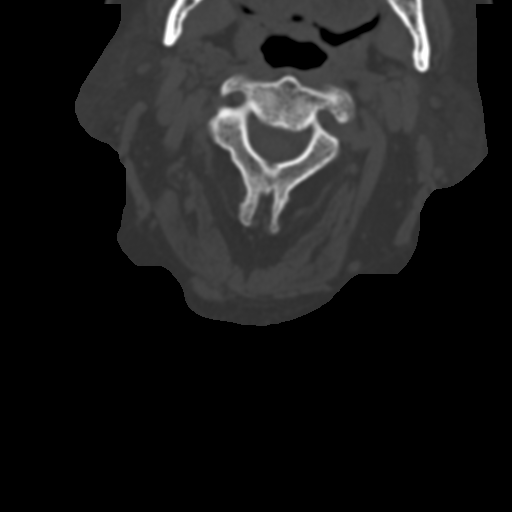

[Series 9: axial recon · axial · 0.23mm/px · z∈[-280,-188]mm · 3 of 103 slices shown]
[im 26/103  bone]
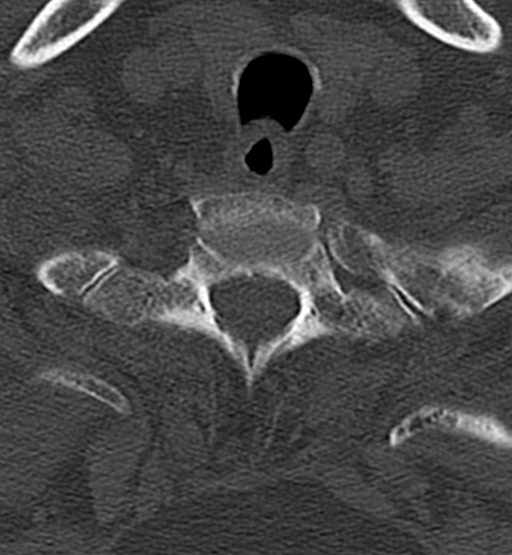
[im 52/103  bone]
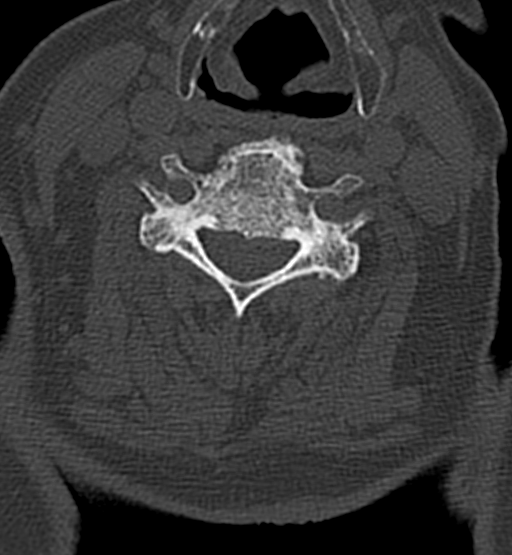
[im 77/103  bone]
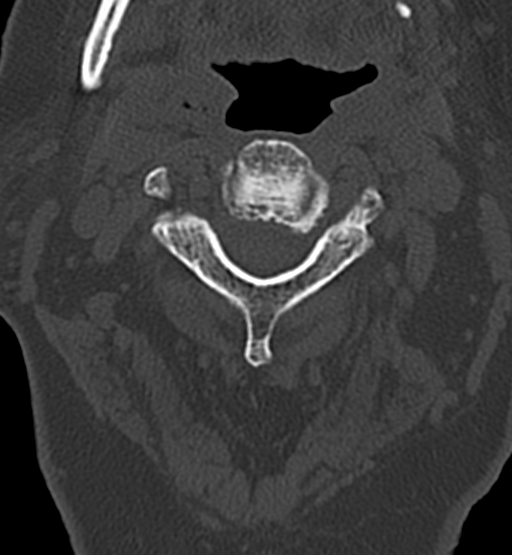

[14 of 33 positions shown; findings below may reference images not displayed]

FINDINGS: CT HEAD FINDINGS

Brain: Diffuse cerebral atrophy. Low-attenuation changes in the deep
white matter consistent small vessel ischemia. No evidence of acute
infarction, hemorrhage, hydrocephalus, extra-axial collection or
mass lesion/mass effect.

Vascular: Tortuous and calcified intracranial arteries.

Skull: Normal. Negative for fracture or focal lesion.

Sinuses/Orbits: No acute finding.

Other: None.

CT CERVICAL SPINE FINDINGS

Alignment: Normal.

Skull base and vertebrae: No acute fracture. No primary bone lesion
or focal pathologic process.

Soft tissues and spinal canal: No prevertebral fluid or swelling. No
visible canal hematoma.

Disc levels: Degenerative changes throughout the cervical spine with
narrowed interspaces and endplate hypertrophic changes. Degenerative
changes are most prominent at C4-5 and C5-6 levels. Degenerative
changes throughout the facet joints.

Upper chest: Mild emphysematous changes in the lung apices.

Other: Vascular calcifications in the cervical carotid arteries.
IMPRESSION: 1. No acute intracranial abnormalities. Chronic atrophy and small
vessel ischemic changes.
2. No acute displaced cervical spine fractures identified.
Degenerative changes are present.
3. Atherosclerotic vascular disease.

## 2020-05-05 ENCOUNTER — Other Ambulatory Visit: Payer: Self-pay

## 2020-05-05 ENCOUNTER — Inpatient Hospital Stay (HOSPITAL_COMMUNITY)
Admission: EM | Admit: 2020-05-05 | Discharge: 2020-05-10 | DRG: 871 | Disposition: A | Discharge status: Home Health | Payer: No Typology Code available for payment source | Attending: Family Medicine | Admitting: Family Medicine

## 2020-05-05 ENCOUNTER — Emergency Department (HOSPITAL_COMMUNITY): Payer: No Typology Code available for payment source

## 2020-05-05 ENCOUNTER — Encounter (HOSPITAL_COMMUNITY): Payer: Self-pay | Admitting: Emergency Medicine

## 2020-05-05 DIAGNOSIS — Z7984 Long term (current) use of oral hypoglycemic drugs: Secondary | ICD-10-CM

## 2020-05-05 DIAGNOSIS — U071 COVID-19: Secondary | ICD-10-CM

## 2020-05-05 DIAGNOSIS — Z6829 Body mass index (BMI) 29.0-29.9, adult: Secondary | ICD-10-CM

## 2020-05-05 DIAGNOSIS — I714 Abdominal aortic aneurysm, without rupture, unspecified: Secondary | ICD-10-CM | POA: Diagnosis present

## 2020-05-05 DIAGNOSIS — J9601 Acute respiratory failure with hypoxia: Secondary | ICD-10-CM | POA: Diagnosis not present

## 2020-05-05 DIAGNOSIS — E785 Hyperlipidemia, unspecified: Secondary | ICD-10-CM | POA: Diagnosis present

## 2020-05-05 DIAGNOSIS — A0472 Enterocolitis due to Clostridium difficile, not specified as recurrent: Secondary | ICD-10-CM | POA: Diagnosis present

## 2020-05-05 DIAGNOSIS — A4189 Other specified sepsis: Principal | ICD-10-CM | POA: Diagnosis present

## 2020-05-05 DIAGNOSIS — Z79891 Long term (current) use of opiate analgesic: Secondary | ICD-10-CM

## 2020-05-05 DIAGNOSIS — N179 Acute kidney failure, unspecified: Secondary | ICD-10-CM | POA: Diagnosis present

## 2020-05-05 DIAGNOSIS — E1122 Type 2 diabetes mellitus with diabetic chronic kidney disease: Secondary | ICD-10-CM | POA: Diagnosis present

## 2020-05-05 DIAGNOSIS — I130 Hypertensive heart and chronic kidney disease with heart failure and stage 1 through stage 4 chronic kidney disease, or unspecified chronic kidney disease: Secondary | ICD-10-CM | POA: Diagnosis present

## 2020-05-05 DIAGNOSIS — I2510 Atherosclerotic heart disease of native coronary artery without angina pectoris: Secondary | ICD-10-CM | POA: Diagnosis present

## 2020-05-05 DIAGNOSIS — Z960 Presence of urogenital implants: Secondary | ICD-10-CM | POA: Diagnosis present

## 2020-05-05 DIAGNOSIS — E119 Type 2 diabetes mellitus without complications: Secondary | ICD-10-CM

## 2020-05-05 DIAGNOSIS — Z87442 Personal history of urinary calculi: Secondary | ICD-10-CM

## 2020-05-05 DIAGNOSIS — E669 Obesity, unspecified: Secondary | ICD-10-CM | POA: Diagnosis present

## 2020-05-05 DIAGNOSIS — Z8673 Personal history of transient ischemic attack (TIA), and cerebral infarction without residual deficits: Secondary | ICD-10-CM

## 2020-05-05 DIAGNOSIS — J189 Pneumonia, unspecified organism: Secondary | ICD-10-CM

## 2020-05-05 DIAGNOSIS — Z9049 Acquired absence of other specified parts of digestive tract: Secondary | ICD-10-CM

## 2020-05-05 DIAGNOSIS — E872 Acidosis: Secondary | ICD-10-CM | POA: Diagnosis present

## 2020-05-05 DIAGNOSIS — J1282 Pneumonia due to coronavirus disease 2019: Secondary | ICD-10-CM | POA: Diagnosis present

## 2020-05-05 DIAGNOSIS — I10 Essential (primary) hypertension: Secondary | ICD-10-CM | POA: Diagnosis present

## 2020-05-05 DIAGNOSIS — H9190 Unspecified hearing loss, unspecified ear: Secondary | ICD-10-CM | POA: Diagnosis present

## 2020-05-05 DIAGNOSIS — L03031 Cellulitis of right toe: Secondary | ICD-10-CM | POA: Diagnosis present

## 2020-05-05 DIAGNOSIS — L03032 Cellulitis of left toe: Secondary | ICD-10-CM | POA: Diagnosis present

## 2020-05-05 DIAGNOSIS — Z8701 Personal history of pneumonia (recurrent): Secondary | ICD-10-CM

## 2020-05-05 DIAGNOSIS — I480 Paroxysmal atrial fibrillation: Secondary | ICD-10-CM | POA: Diagnosis present

## 2020-05-05 DIAGNOSIS — Z96611 Presence of right artificial shoulder joint: Secondary | ICD-10-CM | POA: Diagnosis present

## 2020-05-05 DIAGNOSIS — Z7901 Long term (current) use of anticoagulants: Secondary | ICD-10-CM

## 2020-05-05 DIAGNOSIS — Z79899 Other long term (current) drug therapy: Secondary | ICD-10-CM

## 2020-05-05 DIAGNOSIS — I4891 Unspecified atrial fibrillation: Secondary | ICD-10-CM | POA: Diagnosis not present

## 2020-05-05 DIAGNOSIS — I252 Old myocardial infarction: Secondary | ICD-10-CM

## 2020-05-05 DIAGNOSIS — I5032 Chronic diastolic (congestive) heart failure: Secondary | ICD-10-CM | POA: Diagnosis present

## 2020-05-05 DIAGNOSIS — Z66 Do not resuscitate: Secondary | ICD-10-CM | POA: Diagnosis present

## 2020-05-05 DIAGNOSIS — A419 Sepsis, unspecified organism: Secondary | ICD-10-CM

## 2020-05-05 DIAGNOSIS — N1832 Chronic kidney disease, stage 3b: Secondary | ICD-10-CM | POA: Diagnosis present

## 2020-05-05 LAB — CBC WITH DIFFERENTIAL/PLATELET
Abs Immature Granulocytes: 0.02 10*3/uL (ref 0.00–0.07)
Basophils Absolute: 0 10*3/uL (ref 0.0–0.1)
Basophils Relative: 0 %
Eosinophils Absolute: 0 10*3/uL (ref 0.0–0.5)
Eosinophils Relative: 0 %
HCT: 38.7 % — ABNORMAL LOW (ref 39.0–52.0)
Hemoglobin: 13 g/dL (ref 13.0–17.0)
Immature Granulocytes: 0 %
Lymphocytes Relative: 21 %
Lymphs Abs: 0.9 10*3/uL (ref 0.7–4.0)
MCH: 33.2 pg (ref 26.0–34.0)
MCHC: 33.6 g/dL (ref 30.0–36.0)
MCV: 98.7 fL (ref 80.0–100.0)
Monocytes Absolute: 0.4 10*3/uL (ref 0.1–1.0)
Monocytes Relative: 10 %
Neutro Abs: 3.2 10*3/uL (ref 1.7–7.7)
Neutrophils Relative %: 69 %
Platelets: 157 10*3/uL (ref 150–400)
RBC: 3.92 MIL/uL — ABNORMAL LOW (ref 4.22–5.81)
RDW: 12.9 % (ref 11.5–15.5)
WBC: 4.6 10*3/uL (ref 4.0–10.5)
nRBC: 0 % (ref 0.0–0.2)

## 2020-05-05 LAB — COMPREHENSIVE METABOLIC PANEL
ALT: 36 U/L (ref 0–44)
AST: 56 U/L — ABNORMAL HIGH (ref 15–41)
Albumin: 3 g/dL — ABNORMAL LOW (ref 3.5–5.0)
Alkaline Phosphatase: 33 U/L — ABNORMAL LOW (ref 38–126)
Anion gap: 10 (ref 5–15)
BUN: 30 mg/dL — ABNORMAL HIGH (ref 8–23)
CO2: 22 mmol/L (ref 22–32)
Calcium: 8 mg/dL — ABNORMAL LOW (ref 8.9–10.3)
Chloride: 104 mmol/L (ref 98–111)
Creatinine, Ser: 1.99 mg/dL — ABNORMAL HIGH (ref 0.61–1.24)
GFR calc Af Amer: 34 mL/min — ABNORMAL LOW (ref >60)
GFR calc non Af Amer: 29 mL/min — ABNORMAL LOW (ref >60)
Glucose, Bld: 241 mg/dL — ABNORMAL HIGH (ref 70–99)
Potassium: 4.1 mmol/L (ref 3.5–5.1)
Sodium: 136 mmol/L (ref 135–145)
Total Bilirubin: 0.3 mg/dL (ref 0.3–1.2)
Total Protein: 6.4 g/dL — ABNORMAL LOW (ref 6.5–8.1)

## 2020-05-05 LAB — LACTIC ACID, PLASMA: Lactic Acid, Venous: 2.3 mmol/L (ref 0.5–1.9)

## 2020-05-05 LAB — SARS CORONAVIRUS 2 BY RT PCR (HOSPITAL ORDER, PERFORMED IN ~~LOC~~ HOSPITAL LAB): SARS Coronavirus 2: POSITIVE — AB

## 2020-05-05 LAB — PROTIME-INR
INR: 1.1 (ref 0.8–1.2)
Prothrombin Time: 13.8 seconds (ref 11.4–15.2)

## 2020-05-05 LAB — LIPASE, BLOOD: Lipase: 40 U/L (ref 11–51)

## 2020-05-05 LAB — APTT: aPTT: 35 seconds (ref 24–36)

## 2020-05-05 MED ORDER — SODIUM CHLORIDE 0.9 % IV SOLN
2.0000 g | Freq: Once | INTRAVENOUS | Status: AC
  Administered 2020-05-05: 2 g via INTRAVENOUS
  Filled 2020-05-05: qty 2

## 2020-05-05 MED ORDER — GUAIFENESIN-DM 100-10 MG/5ML PO SYRP
10.0000 mL | ORAL_SOLUTION | ORAL | Status: DC | PRN
  Administered 2020-05-06: 10 mL via ORAL
  Filled 2020-05-05: qty 10

## 2020-05-05 MED ORDER — SODIUM CHLORIDE 0.9 % IV BOLUS (SEPSIS)
1000.0000 mL | Freq: Once | INTRAVENOUS | Status: AC
  Administered 2020-05-05: 1000 mL via INTRAVENOUS

## 2020-05-05 MED ORDER — VANCOMYCIN HCL IN DEXTROSE 1-5 GM/200ML-% IV SOLN
1000.0000 mg | Freq: Once | INTRAVENOUS | Status: DC
  Filled 2020-05-05: qty 200

## 2020-05-05 MED ORDER — METRONIDAZOLE IN NACL 5-0.79 MG/ML-% IV SOLN
500.0000 mg | Freq: Once | INTRAVENOUS | Status: AC
  Administered 2020-05-05: 500 mg via INTRAVENOUS
  Filled 2020-05-05: qty 100

## 2020-05-05 MED ORDER — SODIUM CHLORIDE 0.9 % IV SOLN: 100.0000 mg | Freq: Every day | INTRAVENOUS | Status: DC

## 2020-05-05 MED ORDER — VANCOMYCIN HCL 2000 MG/400ML IV SOLN
2000.0000 mg | Freq: Once | INTRAVENOUS | Status: AC
  Administered 2020-05-05: 2000 mg via INTRAVENOUS
  Filled 2020-05-05: qty 400

## 2020-05-05 MED ORDER — SODIUM CHLORIDE 0.9 % IV SOLN: 200.0000 mg | Freq: Once | INTRAVENOUS | Status: DC

## 2020-05-05 MED ORDER — HYDROCOD POLST-CPM POLST ER 10-8 MG/5ML PO SUER: 5.0000 mL | Freq: Two times a day (BID) | ORAL | Status: DC | PRN

## 2020-05-05 NOTE — Progress Notes (Signed)
A consult was received from an ED physician for Vancomycin & Cefepime per pharmacy dosing.  The patient's profile has been reviewed for ht/wt/allergies/indication/available labs.   A one time order has been placed for Cefepime 2gm & Vancomycin 2gm IV.  Further antibiotics/pharmacy consults should be ordered by admitting physician if indicated.                       Thank you, Junita Push PharmD 05/05/2020  7:58 PM

## 2020-05-05 NOTE — H&P (Signed)
Charles Richard [id number] DOB: September 16, 1930 DOA: 05/05/2020    PCP: [facility]   Outpatient Specialists:   CARDS:  Dr. Herbie [place]  Urology Dr.Eskridge  Patient arrived to ER on 05/05/20 at 1859  Patient coming from: home Lives  With family    Chief Complaint:   Chief Complaint  Patient presents with  . Fever  . Fatigue    HPI: Charles Richard is a 86 y.o. male with medical history significant of AAA, A.fib, DM2, HTN, hx of CVA, HLD, DM2    Presented with diarrhea and cough for the past 4 days. Decreased appetite.  [facility], Daughter work with [employer] office  Infectious risk factors:  Reports fever, shortness of breath, dry cough, chest pain,URI symptoms, anosmia/change in taste, mouth sore  N/Diarrhea  Body aches, severe fatigue  Reports known sick contacts, known COVID 19 exposure, inability to completely isolate  Has  NOt been vaccinated against COVID   In  ER   COVID TEST   POSITIVE,     Lab Results  Component Value Date   SARSCOV2NAA POSITIVE (A) 05/05/2020     Regarding pertinent Chronic problems:     Hyperlipidemia -  on statins zocor   HTN on coreg, lisinopril    DM 2 -  Lab Results  Component Value Date   HGBA1C 6.1 (H) 06/05/2017  PO meds only   Hx of CVA - with/out residual deficits    A. Fib -  - CHA2DS2 vas score >3 :  current  on anticoagulation with  Eliquis,           -  Rate control:  Currently controlled with  Coreg       CKD stage III - baseline Cr  1.3 Lab Results  Component Value Date   CREATININE 1.99 (H) 05/05/2020   CREATININE 1.33 (H) 07/22/2017   CREATININE 1.39 (H) 07/21/2017   While in ER: Pt was noted to have bibasilar infiltrates, and tested positive for COVID   Hospitalist was called for admission for COVID PNA  The following Work up has been ordered so far:  Orders Placed This Encounter  Procedures  . Culture, blood (Routine x 2)  . SARS Coronavirus 2 by RT PCR (hospital order,  performed in [facility] hospital lab) Nasopharyngeal Nasopharyngeal Swab  . Urine culture  . DG Chest 2 View  . Comprehensive metabolic panel  . Lactic acid, plasma  . CBC with Differential  . Protime-INR  . Urinalysis, Routine w reflex microscopic  . Lipase, blood  . APTT  . Diet NPO time specified  . Notify Physician if pt is possible Sepsis patient  . Document height and weight  . Insert / maintain saline lock  . Cardiac monitoring  . Refer to Sidebar Report: Sepsis Sidebar ED/IP  . Document vital signs within 1-hour of fluid bolus completion and notify provider of bolus completion  . Insert peripheral IV x 2  . Initiate Carrier Fluid Protocol  . In and Out Cath  . Initiate Code Sepsis ([facility] [phone])  When activated, this will prioritize pharmacy, lab, and radiology services for this patient for STAT collections and interventions.  . Consult to hospitalist  ALL PATIENTS BEING ADMITTED/HAVING PROCEDURES NEED COVID-19 SCREENING  . Pulse oximetry, continuous  . ED EKG 12-Lead  . EKG 12-Lead     Following Medications were ordered in ER: Medications  sodium chloride 0.9 % bolus 1,000 mL (0 mLs Intravenous Stopped 05/05/20 2325)  And  sodium chloride 0.9 % bolus 1,000 mL (0 mLs Intravenous Stopped 05/05/20 2325)    And  sodium chloride 0.9 % bolus 1,000 mL (has no administration in time range)  vancomycin (VANCOREADY) IVPB 2000 mg/400 mL (has no administration in time range)  ceFEPIme (MAXIPIME) 2 g in sodium chloride 0.9 % 100 mL IVPB (0 g Intravenous Stopped 05/05/20 2325)  metroNIDAZOLE (FLAGYL) IVPB 500 mg (0 mg Intravenous Stopped 05/05/20 2325)        Consult Orders  (From admission, onward)         Start     Ordered   05/05/20 2313  Consult to hospitalist  ALL PATIENTS BEING ADMITTED/HAVING PROCEDURES NEED COVID-19 SCREENING  Once    Comments: ALL PATIENTS BEING ADMITTED/HAVING PROCEDURES NEED COVID-19 SCREENING  Provider:  (Not yet assigned)  Question  Answer Comment  Place call to: [facility]   Reason for Consult Admit      05/05/20 2312           Significant initial  Findings: Abnormal Labs Reviewed  SARS CORONAVIRUS 2 BY RT PCR (HOSPITAL ORDER, PERFORMED IN [facility]) - Abnormal; Notable for the following components:      Result Value   SARS Coronavirus 2 POSITIVE (*)    All other components within normal limits  COMPREHENSIVE METABOLIC PANEL - Abnormal; Notable for the following components:   Glucose, Bld 241 (*)    BUN 30 (*)    Creatinine, Ser 1.99 (*)    Calcium 8.0 (*)    Total Protein 6.4 (*)    Albumin 3.0 (*)    AST 56 (*)    Alkaline Phosphatase 33 (*)    GFR calc non Af Amer 29 (*)    GFR calc Af Amer 34 (*)    All other components within normal limits  LACTIC ACID, PLASMA - Abnormal; Notable for the following components:   Lactic Acid, Venous 2.3 (*)    All other components within normal limits  CBC WITH DIFFERENTIAL/PLATELET - Abnormal; Notable for the following components:   RBC 3.92 (*)    HCT 38.7 (*)    All other components within normal limits    Otherwise labs showing:    Recent Labs  Lab 05/05/20 1924  NA 136  K 4.1  CO2 22  GLUCOSE 241*  BUN 30*  CREATININE 1.99*  CALCIUM 8.0*   Cr    Up from baseline see below Lab Results  Component Value Date   CREATININE 1.99 (H) 05/05/2020   CREATININE 1.33 (H) 07/22/2017   CREATININE 1.39 (H) 07/21/2017    Recent Labs  Lab 05/05/20 1924  AST 56*  ALT 36  ALKPHOS 33*  BILITOT 0.3  PROT 6.4*  ALBUMIN 3.0*   Lab Results  Component Value Date   CALCIUM 8.0 (L) 05/05/2020     WBC      Component Value Date/Time   WBC 4.6 05/05/2020 1924   ANC    Component Value Date/Time   NEUTROABS 3.2 05/05/2020 1924   ALC No components found for: LYMPHAB    Plt: Lab Results  Component Value Date   PLT 157 05/05/2020     Lactic Acid, Venous    Component Value Date/Time   LATICACIDVEN 2.3 (HH) 05/05/2020  1924    Procalcitonin 0.18   COVID-19 Labs  Recent Labs    05/06/20 0040  FERRITIN 440*  LDH 212*  CRP 6.6*    Lab Results  Component Value Date  SARSCOV2NAA POSITIVE (A) 05/05/2020    HG/HCT  stable,       Component Value Date/Time   HGB 13.0 05/05/2020 1924   HCT 38.7 (L) 05/05/2020 1924    Recent Labs  Lab 05/05/20 1953  LIPASE 40   No results for input(s): AMMONIA in the last 168 hours.  No components found for: LABALBU   Troponin  15     ECG: Ordered Personally reviewed by me showing: HR : 111 Rhythm:  A.fib. W RVR,  2:1 block  nonspecific changes,  QTC 452      UA  no evidence of UTI      Urine analysis:    Component Value Date/Time   COLORURINE YELLOW 05/06/2020 0040   APPEARANCEUR HAZY (A) 05/06/2020 0040   LABSPEC 1.018 05/06/2020 0040   PHURINE 5.0 05/06/2020 0040   GLUCOSEU NEGATIVE 05/06/2020 0040   HGBUR NEGATIVE 05/06/2020 0040   BILIRUBINUR NEGATIVE 05/06/2020 0040   KETONESUR 5 (A) 05/06/2020 0040   PROTEINUR 30 (A) 05/06/2020 0040   UROBILINOGEN 0.2 05/19/2009 0733   NITRITE NEGATIVE 05/06/2020 0040   LEUKOCYTESUR NEGATIVE 05/06/2020 0040      Ordered     CXR - bibasilar infiltrates      ED Triage Vitals  Enc Vitals Group     BP 05/05/20 1910 95/79     Pulse Rate 05/05/20 1910 (!) 116     Resp 05/05/20 1910 (!) 30     Temp 05/05/20 1910 (!) 102.1 F (38.9 C)     Temp Source 05/05/20 1910 Oral     SpO2 05/05/20 1910 96 %     Weight 05/05/20 2049 215 lb (97.5 kg)     Height 05/05/20 2049 6' (1.829 m)     Head Circumference --      Peak Flow --      Pain Score 05/05/20 1912 0     Pain Loc --      Pain Edu? --      Excl. in GC? --   TMAX(24)@       Latest  Blood pressure 95/79, pulse (!) 116, temperature (!) 102.1 F (38.9 C), temperature source Oral, resp. rate (!) 30, height 6' (1.829 m), weight 97.5 kg, SpO2 96 %.    Review of Systems:    Pertinent positives include Fevers, chills, fatigue Nausea  diarrhea,  Constitutional:  No weight loss, night sweats,, weight loss  HEENT:  No headaches, Difficulty swallowing,Tooth/dental problems,Sore throat,  No sneezing, itching, ear ache, nasal congestion, post nasal drip,  Cardio-vascular:  No chest pain, Orthopnea, PND, anasarca, dizziness, palpitations.no Bilateral lower extremity swelling  GI:  No heartburn, indigestion, abdominal pain, , vomiting, change in bowel habits, loss of appetite, melena, blood in stool, hematemesis Resp:  no shortness of breath at rest. No dyspnea on exertion, No excess mucus, no productive cough, No non-productive cough, No coughing up of blood.No change in color of mucus.No wheezing. Skin:  no rash or lesions. No jaundice GU:  no dysuria, change in color of urine, no urgency or frequency. No straining to urinate.  No flank pain.  Musculoskeletal:  No joint pain or no joint swelling. No decreased range of motion. No back pain.  Psych:  No change in mood or affect. No depression or anxiety. No memory loss.  Neuro: no localizing neurological complaints, no tingling, no weakness, no double vision, no gait abnormality, no slurred speech, no confusion  All systems reviewed and apart from HOPI all are negative  Past Medical History:   Past Medical History:  Diagnosis Date  . AAA (abdominal aortic aneurysm) without rupture (HCC) 06/03/2017  . Arthritis   . Atrial fibrillation with rapid ventricular response (HCC) 04/2017   New diagnosis during admission for urosepsis secondary to left nephrolithiasis/ureteral stone with hydronephrosis  . Diabetes mellitus without complication (HCC)   . Dysrhythmia    a fib  . Essential hypertension   . Hard of hearing   . History of CVA (cerebrovascular accident) without residual deficits 05/2009   Admitted with expressive aphasia & confusion following a neurocardiogenic syncope event (related to dehydration, AKD & BB related hypotension/bradycardia)  . History of kidney  stones   . Hyperlipidemia   . Left nephrolithiasis 04/2017   s/p L Ureteral stent for L hydronephrosis & UTI/Urosepsis.    . Myocardial infarction (HCC)   . Stroke [facility])    mild stroke no residuals      Past Surgical History:  Procedure Laterality Date  . CHOLECYSTECTOMY    . CYSTOSCOPY W/ URETERAL STENT PLACEMENT Left 05/09/2017   Procedure: CYSTOSCOPY WITH RETROGRADE PYELOGRAM/URETERAL STENT PLACEMENT;  Surgeon: Jerilee Field, MD;  Location: [facility];  Service: Urology;  Laterality: Left;  . CYSTOSCOPY WITH RETROGRADE PYELOGRAM, URETEROSCOPY AND STENT PLACEMENT Left 06/08/2017   Procedure: CYSTOSCOPY WITH LEFT RETROGRADE PYELOGRAM, URETEROSCOPY HOLMIUM LASER LITHOTRIPSY BASKET EXTRACTION  AND STENT EXCHANGE;  Surgeon: Jerilee Field, MD;  Location: [facility];  Service: Urology;  Laterality: Left;  . EYE SURGERY     right  . HOLMIUM LASER APPLICATION Left 06/08/2017   Procedure: HOLMIUM LASER APPLICATION;  Surgeon: Jerilee Field, MD;  Location: [facility];  Service: Urology;  Laterality: Left;  . LITHOTRIPSY    . REVERSE SHOULDER ARTHROPLASTY Right 07/20/2017   Procedure: REVERSE RIGHT SHOULDER ARTHROPLASTY;  Surgeon: Yolonda Kida, MD;  Location: [facility] OR;  Service: Orthopedics;  Laterality: Right;  150 mins    Social History:  Ambulatory   Independently      reports that he has never smoked. He has never used smokeless tobacco. He reports that he does not drink alcohol or use drugs.  Family History:  Family History  Problem Relation Age of Onset  . Lung cancer Father        black lung  . CAD Neg Hx   . Kidney Stones Neg Hx   . Aneurysm Neg Hx     Allergies: No Known Allergies   Prior to Admission medications   Medication Sig Start Date End Date Taking? Authorizing Provider  apixaban (ELIQUIS) 2.5 MG TABS tablet Take 1 tablet (2.5 mg total) by mouth 2 (two) times daily. 05/11/17  Yes Vassie Loll, MD  carvedilol (COREG) 6.25 MG tablet Take 6.25 mg by mouth  daily with breakfast.   Yes [provider]  cholecalciferol (VITAMIN D) 1000 units tablet Take 1,000 Units by mouth at bedtime.   Yes [provider]  docusate sodium (COLACE) 100 MG capsule Take 200 mg by mouth daily.   Yes [provider]  lisinopril (PRINIVIL,ZESTRIL) 40 MG tablet Take 0.5 tablets (20 mg total) by mouth daily with breakfast. HOLD UNTIL FOLLOW UP WITH PCP 05/11/17  Yes Vassie Loll, MD  loratadine (CLARITIN) 10 MG tablet Take 10 mg by mouth daily with breakfast.    Yes [provider]  meclizine (ANTIVERT) 25 MG tablet Take 25 mg by mouth 2 (two) times daily.    Yes [provider]  metFORMIN (GLUCOPHAGE) 1000 MG tablet Take 1,000 mg by  mouth 2 (two) times daily with a meal.   Yes [provider]  Multiple Vitamin (MULTIVITAMIN WITH MINERALS) TABS tablet Take 1 tablet by mouth at bedtime.   Yes [provider]  Propylene Glycol (SYSTANE BALANCE) 0.6 % SOLN Place 1-2 drops into both eyes daily as needed (for dry eyes).   Yes [provider]  simvastatin (ZOCOR) 10 MG tablet Take 10 mg by mouth daily.   Yes [provider]  traMADol (ULTRAM) 50 MG tablet Take 100 mg by mouth every 6 (six) hours as needed for moderate pain.   Yes [provider]  ondansetron (ZOFRAN ODT) 4 MG disintegrating tablet Take 1 tablet (4 mg total) by mouth every 8 (eight) hours as needed for nausea or vomiting. 07/20/17   Yolonda Kida, MD  oxyCODONE (OXY IR/ROXICODONE) 5 MG immediate release tablet Take 1-2 tablets (5-10 mg total) by mouth every 4 (four) hours as needed for breakthrough pain. Patient not taking: Reported on 05/05/2020 07/22/17   Dimitri Ped, PA-C   Physical Exam: Blood pressure 95/79, pulse (!) 116, temperature (!) 102.1 F (38.9 C), temperature source Oral, resp. rate (!) 30, height 6' (1.829 m), weight 97.5 kg, SpO2 96 %. 1. General:  in No  Acute distress    Chronically ill   -appearing 2. Psychological: Alert and  Oriented 3. Head/ENT:     Dry Mucous Membranes                          Head Non traumatic, neck supple                           Poor Dentition 4. SKIN:   decreased Skin turgor,  Skin clean Dry and intact no rash 5. Heart: Regular rate and rhythm no Murmur, no Rub or gallop 6. Lungs: no wheezes or crackles   7. Abdomen: Soft,  non-tender,   Distended  obese  bowel sounds present 8. Lower extremities: no clubbing, cyanosis, no edema 9. Neurologically Grossly intact, moving all 4 extremities equally   10. MSK: Normal range of motion   All other LABS:     Recent Labs  Lab 05/05/20 1924  WBC 4.6  NEUTROABS 3.2  HGB 13.0  HCT 38.7*  MCV 98.7  PLT 157     Recent Labs  Lab 05/05/20 1924  NA 136  K 4.1  CL 104  CO2 22  GLUCOSE 241*  BUN 30*  CREATININE 1.99*  CALCIUM 8.0*     Recent Labs  Lab 05/05/20 1924  AST 56*  ALT 36  ALKPHOS 33*  BILITOT 0.3  PROT 6.4*  ALBUMIN 3.0*       Cultures:    Component Value Date/Time   SDES LEFT ANTECUBITAL 05/09/2017 1848   SPECREQUEST  05/09/2017 1848    BOTTLES DRAWN AEROBIC AND ANAEROBIC Blood Culture adequate volume   CULT  05/09/2017 1848    NO GROWTH 5 DAYS Performed at [facility], [address] [place], [place] [address]    REPTSTATUS 05/14/2017 FINAL 05/09/2017 1848     Radiological Exams on Admission: DG Chest 2 View  Result Date: 05/05/2020 CLINICAL DATA:  Fever, weakness for 4 days EXAM: CHEST - 2 VIEW COMPARISON:  05/09/2017 FINDINGS: Single frontal view of the chest demonstrates an unremarkable cardiac silhouette. Atherosclerosis of the aortic arch. Patchy consolidation at the left lung base could reflect atelectasis or airspace  disease. Evaluation limited on this portable exam. No large effusion or pneumothorax. No acute bony abnormalities. IMPRESSION: 1. Patchy consolidation at the left lung base which may reflect atelectasis or airspace disease. PA and  lateral views of the chest may be useful when clinical situation permits. Electronically Signed   By: Randa Ngo M.D.   On: 05/05/2020 20:13    Chart has been reviewed    Assessment/Plan   86 y.o. male with medical history significant of AAA, A.fib, DM2, HTN, hx of CVA, HLD, DM2     Admitted for covid infection  Present on Admission: . COVID-19 virus infection -  ER Novel Corona Virus testing:   Ordered 05/05/20 and is  positive   Following concerning LAB/ imaging findings:  CBC: leukopenia, lymphopenia     ANC/ALC ratio>3.5 BMP: increased BUN/Cr   LFTs: increased AST/ALT/Tbili   CRP, LDH: increased   IL-6 and Ferritin increased   Procalcitonin: low   CXR: hazy bilateral peripheral opacities      -Following work-up initiated:      sputum cultures  Ordered 05/05/20, Blood cultures  Ordered 05/06/20,     Following complications noted:    evidence of AKI - will provide gentle rehydration  elevated LFT's likely in the setting of COVID continue to follow  nausea Diarrhea , decreased PO intake resulting in dehydration - will rehydrate   Plan of treatment:   Admit on Airborn Precautions to Current facility   -given severity of illness initiate steroids Decadron 6mg  q 24 hours And pharmacy consult for remdesivir -  IF Hypoxia requiring high nasal flow   and no contraindications will attempt a trial of Actemra (discussed with family risks and benefits) - Will follow daily d.dimer - Assess for ability to prone  - Supportive management -Fluid sparing resuscitation  -Provide oxygen as needed currently on   SpO2: 96 %    Poor Prognostic factors  86 y.o.  Personal hx of  DM2, CAD,  HTN,   Evidence of  organ damage  Present AKI,    tachypnea, tachycardia present on admission  ABS neutrophil to lymphocyte ratio >3.5     Will order Airborne and Contact precautions  Family/ patient prognosis discussion: I have discussed case with the family/ patient  who are aware of  their prognosis At this point they would like  (patient) to be DNR/DNI      The treatment plan and use of medications and known side effects were discussed with  family. It was clearly explained that there is no proven definitive treatment for COVID-19 infection yet. Any medications used here are based on case reports/anecdotal data which are not peer-reviewed and has not been studied using randomized control trials.  Complete risks and long-term side effects are unknown, however in the best clinical judgment they seem to be of some clinical benefit rather than medical risks.  Patient/ family agree with the treatment plan and want to receive these treatments as indicated.     . Essential hypertension - continue home meds, hold lisinopril  . Atrial fibrillation with RVR (HCC)  - curretnly HR controlled continue apixiban  . AKI (acute kidney injury) ([place]) - due to covid gently rehydrate Hold lisinopril  . AAA (abdominal aortic aneurysm) without rupture (HCC) - chronic stable   Dm 2 -  - Order Sensitive  SSI     -  check TSH and HgA1C  - Hold by mouth medications     Other plan as per orders.  DVT prophylaxis:  Eliquis   Code Status:  DNR/DNI as per family  I had personally discussed CODE STATUS with   family   Family Communication:   Family  at  Bedside  plan of care was discussed  with  Daughter   Disposition Plan:      To home once workup is complete and patient is stable  Following barriers for discharge:                                                         Afebrile,                                 No new oxygen requirement                              Improving inflammatory morkers                             Will need to be able to tolerate PO                            Will likely need home health, home O2, set up                               Consults called: none   Admission status:  ED Disposition    ED Disposition Condition Comment   Admit  [facility] [[id number]]  Level of Care: Telemetry [5]  Admit to tele based on following criteria: Other see comments  Comments: covid  [facility] if equivalent level of care is available:: No  Covid Evaluation: Confirmed COVID Positive  Diagnosis: COVID-19 virus infection [[phone]][[phone]]  Admitting Physician: Therisa DoyneUTOVA, Terrina Docter [[id number]]  Attending Physician: Therisa DoyneUTOVA, Adasyn Mcadams [[id number]]  Estimated length of stay: past midnight tomorrow  Certification:: I certify this patient will need inpatient services for at least 2 midnights         inpatient     I Expect 2 midnight stay secondary to severity of patient's current illness need for inpatient interventions justified by the following:  hemodynamic instability despite optimal treatment     Severe lab/radiological/exam abnormalities including:    Covid PNA and extensive comorbidities including:  DM2    CHF  CAD  Obesity  CKD  history of stroke with residual deficits .  Chronic anticoagulation  That are currently affecting medical management.   I expect  patient to be hospitalized for 2 midnights requiring inpatient medical care.  Patient is at high risk for adverse outcome (such as loss of life or disability) if not treated.  Indication for inpatient stay as follows:     Need for IV antivirals     Level of care    tele  For 24H    Precautions: admitted as covid positive Airborne and Contact precautions   PPE: Used by the provider:   P100  eye Goggles,  Gloves  Remy Voiles 05/06/2020, 2:28 AM    [facility]  after 2 AM please page floor coverage PA If 7AM-7PM, please contact the day team taking care of the patient using [email or web address]   Patient was evaluated in the context of the global COVID-19 pandemic, which necessitated consideration that the patient might be at risk for infection with the SARS-CoV-2 virus that causes COVID-19. Institutional  protocols and algorithms that pertain to the evaluation of patients at risk for COVID-19 are in a state of rapid change based on information released by regulatory bodies including the CDC and federal and state organizations. These policies and algorithms were followed during the patient's care.

## 2020-05-05 NOTE — ED Provider Notes (Signed)
[facility]-EMERGENCY DEPT Provider Note   CSN: [id number] Arrival date & time: 05/05/20  1859     History Chief Complaint  Patient presents with   Fever   Fatigue    Charles Richard is a 86 y.o. male.  The history is provided by the patient, medical records and a relative.  Fever Max temp prior to arrival:  101 Temp source:  Oral Severity:  Moderate Onset quality:  Gradual Duration:  2 days Timing:  Constant Progression:  Waxing and waning Chronicity:  New Relieved by:  Nothing Worsened by:  Nothing Ineffective treatments:  None tried Associated symptoms: chills, cough and diarrhea   Associated symptoms: no chest pain, no confusion, no congestion, no dysuria, no headaches, no nausea, no rash, no rhinorrhea, no somnolence, no sore throat and no vomiting        Past Medical History:  Diagnosis Date   AAA (abdominal aortic aneurysm) without rupture (HCC) 06/03/2017   Arthritis    Atrial fibrillation with rapid ventricular response (HCC) 04/2017   New diagnosis during admission for urosepsis secondary to left nephrolithiasis/ureteral stone with hydronephrosis   Diabetes mellitus without complication (HCC)    Dysrhythmia    a fib   Essential hypertension    Hard of hearing    History of CVA (cerebrovascular accident) without residual deficits 05/2009   Admitted with expressive aphasia & confusion following a neurocardiogenic syncope event (related to dehydration, AKD & BB related hypotension/bradycardia)   History of kidney stones    Hyperlipidemia    Left nephrolithiasis 04/2017   s/p L Ureteral stent for L hydronephrosis & UTI/Urosepsis.     Myocardial infarction [facility])    Stroke (HCC)    mild stroke no residuals    Patient Active Problem List   Diagnosis Date Noted   Closed fracture of right proximal humerus 07/20/2017   S/P reverse total shoulder arthroplasty, 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   AAA (abdominal aortic aneurysm) without  rupture (HCC) 06/03/2017   Preop cardiovascular exam 06/01/2017   Physical deconditioning    Ureteral stone with hydronephrosis 05/09/2017   Essential hypertension 05/09/2017   Type 2 diabetes mellitus without complication (HCC) 05/09/2017   Hyponatremia 05/09/2017   AKI (acute kidney injury) (HCC) 05/09/2017   Atrial fibrillation with RVR (HCC) 05/09/2017   Sepsis secondary to UTI (HCC) 05/09/2017    Past Surgical History:  Procedure Laterality Date   CHOLECYSTECTOMY     CYSTOSCOPY W/ URETERAL STENT PLACEMENT Left 05/09/2017   Procedure: CYSTOSCOPY WITH RETROGRADE PYELOGRAM/URETERAL STENT PLACEMENT;  Surgeon: Jerilee Field, MD;  Location: [facility];  Service: Urology;  Laterality: Left;   CYSTOSCOPY WITH RETROGRADE PYELOGRAM, URETEROSCOPY AND STENT PLACEMENT Left 06/08/2017   Procedure: CYSTOSCOPY WITH LEFT RETROGRADE PYELOGRAM, URETEROSCOPY HOLMIUM LASER LITHOTRIPSY BASKET EXTRACTION  AND STENT EXCHANGE;  Surgeon: Jerilee Field, MD;  Location: [facility];  Service: Urology;  Laterality: Left;   EYE SURGERY     right   HOLMIUM LASER APPLICATION Left 06/08/2017   Procedure: HOLMIUM LASER APPLICATION;  Surgeon: Jerilee Field, MD;  Location: [facility];  Service: Urology;  Laterality: Left;   LITHOTRIPSY     REVERSE SHOULDER ARTHROPLASTY Right 07/20/2017   Procedure: REVERSE RIGHT SHOULDER ARTHROPLASTY;  Surgeon: Yolonda Kida, MD;  Location: [facility] OR;  Service: Orthopedics;  Laterality: Right;  150 mins       Family History  Problem Relation Age of Onset   Lung cancer Father        black lung   CAD  Neg Hx    Kidney Stones Neg Hx    Aneurysm Neg Hx     Social History   Tobacco Use   Smoking status: Never Smoker   Smokeless tobacco: Never Used  Substance Use Topics   Alcohol use: No   Drug use: No    Home Medications Prior to Admission medications   Medication Sig Start Date End Date Taking? Authorizing Provider  amiodarone (PACERONE) 200 MG  tablet Take 1 tablet by mouth daily. 07/04/17   [provider]  apixaban (ELIQUIS) 2.5 MG TABS tablet Take 1 tablet (2.5 mg total) by mouth 2 (two) times daily. 05/11/17   Vassie Loll, MD  carvedilol (COREG) 6.25 MG tablet Take 6.25 mg by mouth daily with breakfast.    [provider]  cholecalciferol (VITAMIN D) 1000 units tablet Take 1,000 Units by mouth at bedtime.    [provider]  docusate sodium (COLACE) 100 MG capsule Take 200 mg by mouth daily.    [provider]  lisinopril (PRINIVIL,ZESTRIL) 40 MG tablet Take 0.5 tablets (20 mg total) by mouth daily with breakfast. HOLD UNTIL FOLLOW UP WITH PCP 05/11/17   Vassie Loll, MD  loratadine (CLARITIN) 10 MG tablet Take 10 mg by mouth daily with breakfast.     [provider]  meclizine (ANTIVERT) 25 MG tablet Take 25 mg by mouth 2 (two) times daily.     [provider]  metFORMIN (GLUCOPHAGE) 1000 MG tablet Take 1,000 mg by mouth 2 (two) times daily with a meal.    [provider]  Multiple Vitamin (MULTIVITAMIN WITH MINERALS) TABS tablet Take 1 tablet by mouth at bedtime.    [provider]  ondansetron (ZOFRAN ODT) 4 MG disintegrating tablet Take 1 tablet (4 mg total) by mouth every 8 (eight) hours as needed for nausea or vomiting. 07/20/17   Yolonda Kida, MD  oxyCODONE (OXY IR/ROXICODONE) 5 MG immediate release tablet Take 1-2 tablets (5-10 mg total) by mouth every 4 (four) hours as needed for breakthrough pain. 07/22/17   Constable, Amber, PA-C  Propylene Glycol (SYSTANE BALANCE) 0.6 % SOLN Place 1-2 drops into both eyes daily as needed (for dry eyes).    [provider]  simvastatin (ZOCOR) 20 MG tablet Take 10 mg by mouth daily with breakfast.    [provider]  traMADol (ULTRAM) 50 MG tablet Take 100 mg by mouth every 6 (six) hours as needed for moderate pain.    [provider]    Allergies    Patient has no known  allergies.  Review of Systems   Review of Systems  Constitutional: Positive for chills, fatigue and fever. Negative for diaphoresis.  HENT: Negative for congestion, rhinorrhea and sore throat.   Respiratory: Positive for cough. Negative for chest tightness, shortness of breath and wheezing.   Cardiovascular: Negative for chest pain.  Gastrointestinal: Positive for diarrhea. Negative for abdominal pain, constipation, nausea, rectal pain and vomiting.  Genitourinary: Negative for dysuria, flank pain and frequency.  Musculoskeletal: Negative for back pain.  Skin: Negative for rash and wound.  Neurological: Negative for headaches.  Psychiatric/Behavioral: Negative for agitation and confusion.  All other systems reviewed and are negative.   Physical Exam Updated Vital Signs BP 95/79 (BP Location: Left Arm)    Pulse (!) 116    Temp (!) 102.1 F (38.9 C) (Oral)    Resp (!) 30    SpO2 96%   Physical Exam Constitutional:      General:  He is not in acute distress.    Appearance: He is well-developed. He is ill-appearing. He is not toxic-appearing or diaphoretic.  HENT:     Head: Normocephalic and atraumatic.     Right Ear: External ear normal.     Left Ear: External ear normal.     Nose: Nose normal. No congestion or rhinorrhea.     Mouth/Throat:     Mouth: Mucous membranes are dry.     Pharynx: No oropharyngeal exudate or posterior oropharyngeal erythema.  Eyes:     Conjunctiva/sclera: Conjunctivae normal.     Pupils: Pupils are equal, round, and reactive to light.  Cardiovascular:     Rate and Rhythm: Tachycardia present.     Pulses: Normal pulses.     Heart sounds: Murmur present.  Pulmonary:     Effort: Pulmonary effort is normal. No respiratory distress.     Breath sounds: No stridor. No wheezing, rhonchi or rales.  Chest:     Chest wall: No tenderness.  Abdominal:     Palpations: Abdomen is soft.     Tenderness: There is no abdominal tenderness. There is no right CVA  tenderness, left CVA tenderness, guarding or rebound.  Musculoskeletal:        General: No tenderness.     Cervical back: Normal range of motion and neck supple. No tenderness.     Right lower leg: No edema.     Left lower leg: No edema.  Skin:    General: Skin is warm.     Capillary Refill: Capillary refill takes less than 2 seconds.     Coloration: Skin is not pale.     Findings: No erythema or rash.  Neurological:     General: No focal deficit present.     Mental Status: He is alert and oriented to person, place, and time. Mental status is at baseline.     Cranial Nerves: No cranial nerve deficit.     Sensory: No sensory deficit.     Motor: No weakness or abnormal muscle tone.  Psychiatric:        Mood and Affect: Mood normal.     ED Results / Procedures / Treatments   Labs (all labs ordered are listed, but only abnormal results are displayed) Labs Reviewed  SARS CORONAVIRUS 2 BY RT PCR (HOSPITAL ORDER, PERFORMED IN [facility]) - Abnormal; Notable for the following components:      Result Value   SARS Coronavirus 2 POSITIVE (*)    All other components within normal limits  COMPREHENSIVE METABOLIC PANEL - Abnormal; Notable for the following components:   Glucose, Bld 241 (*)    BUN 30 (*)    Creatinine, Ser 1.99 (*)    Calcium 8.0 (*)    Total Protein 6.4 (*)    Albumin 3.0 (*)    AST 56 (*)    Alkaline Phosphatase 33 (*)    GFR calc non Af Amer 29 (*)    GFR calc Af Amer 34 (*)    All other components within normal limits  LACTIC ACID, PLASMA - Abnormal; Notable for the following components:   Lactic Acid, Venous 2.3 (*)    All other components within normal limits  CBC WITH DIFFERENTIAL/PLATELET - Abnormal; Notable for the following components:   RBC 3.92 (*)    HCT 38.7 (*)    All other components within normal limits  URINALYSIS, ROUTINE W REFLEX MICROSCOPIC - Abnormal; Notable for the following components:   APPearance  HAZY (*)    Ketones, ur 5  (*)    Protein, ur 30 (*)    All other components within normal limits  C-REACTIVE PROTEIN - Abnormal; Notable for the following components:   CRP 6.6 (*)    All other components within normal limits  FERRITIN - Abnormal; Notable for the following components:   Ferritin 440 (*)    All other components within normal limits  LACTATE DEHYDROGENASE - Abnormal; Notable for the following components:   LDH 212 (*)    All other components within normal limits  CBG MONITORING, ED - Abnormal; Notable for the following components:   Glucose-Capillary 126 (*)    All other components within normal limits  CULTURE, BLOOD (ROUTINE X 2)  CULTURE, BLOOD (ROUTINE X 2)  URINE CULTURE  LACTIC ACID, PLASMA  PROTIME-INR  LIPASE, BLOOD  APTT  PROCALCITONIN  CBC WITH DIFFERENTIAL/PLATELET  HEMOGLOBIN A1C  D-DIMER, QUANTITATIVE (NOT AT [facility])  FIBRINOGEN  TROPONIN I (HIGH SENSITIVITY)  TROPONIN I (HIGH SENSITIVITY)    EKG EKG Interpretation  Date/Time:  Wednesday May 05 2020 20:10:15 EDT Ventricular Rate:  111 PR Interval:    QRS Duration: 99 QT Interval:  332 QTC Calculation: 452 R Axis:   -26 Text Interpretation: Atrial flutter with predominant 2:1 AV block Borderline left axis deviation Low voltage, precordial leads When compared to prior, similar a-fib. new PVC. No STEMI Confirmed by Antony Blackbird [id number]) on 05/05/2020 9:11:52 PM   Radiology DG Chest 2 View  Result Date: 05/05/2020 CLINICAL DATA:  Fever, weakness for 4 days EXAM: CHEST - 2 VIEW COMPARISON:  05/09/2017 FINDINGS: Single frontal view of the chest demonstrates an unremarkable cardiac silhouette. Atherosclerosis of the aortic arch. Patchy consolidation at the left lung base could reflect atelectasis or airspace disease. Evaluation limited on this portable exam. No large effusion or pneumothorax. No acute bony abnormalities. IMPRESSION: 1. Patchy consolidation at the left lung base which may reflect atelectasis or airspace  disease. PA and lateral views of the chest may be useful when clinical situation permits. Electronically Signed   By: Randa Ngo M.D.   On: 05/05/2020 20:13    Procedures Procedures (including critical care time)  Medications Ordered in ED Medications  sodium chloride 0.9 % bolus 1,000 mL (0 mLs Intravenous Stopped 05/05/20 2325)    And  sodium chloride 0.9 % bolus 1,000 mL (0 mLs Intravenous Stopped 05/05/20 2325)    And  sodium chloride 0.9 % bolus 1,000 mL (1,000 mLs Intravenous New Bag/Given 05/05/20 2338)  vancomycin (VANCOREADY) IVPB 2000 mg/400 mL (2,000 mg Intravenous New Bag/Given 05/05/20 2338)  ceFEPIme (MAXIPIME) 2 g in sodium chloride 0.9 % 100 mL IVPB (0 g Intravenous Stopped 05/05/20 2325)  metroNIDAZOLE (FLAGYL) IVPB 500 mg (0 mg Intravenous Stopped 05/05/20 2325)     CRITICAL CARE Performed by: Gwenyth Allegra Shadee Montoya Total critical care time: 35 minutes Critical care time was exclusive of separately billable procedures and treating other patients. Critical care was necessary to treat or prevent imminent or life-threatening deterioration. Critical care was time spent personally by me on the following activities: development of treatment plan with patient and/or surrogate as well as nursing, discussions with consultants, evaluation of patient's response to treatment, examination of patient, obtaining history from patient or surrogate, ordering and performing treatments and interventions, ordering and review of laboratory studies, ordering and review of radiographic studies, pulse oximetry and re-evaluation of patient's condition.    ED Course  I have reviewed the triage vital signs  and the nursing notes.  Pertinent labs & imaging results that were available during my care of the patient were reviewed by me and considered in my medical decision making (see chart for details).    MDM Rules/Calculators/A&P                      Charles Richard is a 86 y.o. male with past  medical history significant for A. fib with RVR on Eliquis, AAA, prior stroke, diabetes, hypertension, prior kidney stones, and prior urosepsis who presents with fevers, chills, fatigue, and cough.  Patient is going by family reports that today, patient was feeling fatigued and was found to be very febrile at home.  His temp was 102.1 orally here.  He is tachycardic, tachypneic, and blood pressures in the 90s.  He appears ill.  She reports that he has had a productive cough with some phlegm but does not complain of any urinary symptoms.  He denies any flank pain or back pain.  No abdominal pain.  No chest pain.  No headache or neck stiffness.  He has not had a Covid vaccine because his daughter reports that "he did not want it".  Unknown if there are any coronavirus contacts.  Otherwise he reports he has not been eating or drinking as much today but he did have diarrhea over the last few days.  On exam, lungs are coarse bilaterally.  Is very warm to the touch.  Chest and abdomen are nontender.  Minimal edema in the legs.  Patient is hard of hearing but is able to answer questions appropriately.  No neck pain or neck stiffness on exam.  No focal neurologic deficits.  Pupils are symmetric active and speech is clear with symmetric smile.  Abdomen is nontender and CVA areas and flanks nontender.  Back nontender.  No significant rashes but he does have 2 small areas of redness on his toes which daughter reports has been on amoxicillin for and is improving.  Clinically I am concerned about sepsis given the patient's vital signs.  He was made a code sepsis due to the borderline hypotension and concern for possible pulmonary source given the cough.  Given his history of urinary sepsis, we will get the urinalysis and culture.  Given lack of any flank pain, abdominal pain, back pain, will hold on any imaging at this time.  He will be admitted after work-up was completed.  Low suspicion for meningitis or encephalitis at  this time given lack of any headache, neck pain, and other than fatigue, baseline mental status.  11:41 PM Patient's coronavirus test is positive.  Suspect Covid is contributing to his symptoms.  He was started on broad-spectrum antibiotics due to the pneumonia seen on x-ray and his sepsis on arrival.  His blood pressure has improved.  Patient does have what appears to be AKI compared to his baseline.  We will get in and out catheterization performed and the daughter agrees with this to get a clean urine.  Patient will be admitted for sepsis with pneumonia and COVID-19 infection.      Final Clinical Impression(s) / ED Diagnoses Final diagnoses:  COVID-19  Recurrent pneumonia  Sepsis, due to unspecified organism, unspecified whether acute organ dysfunction present [facility])    Clinical Impression: 1. COVID-19   2. Recurrent pneumonia   3. Sepsis, due to unspecified organism, unspecified whether acute organ dysfunction present [facility])     Disposition: Admit  This note was prepared with assistance  of Conservation officer, historic buildings. Occasional wrong-word or sound-a-like substitutions may have occurred due to the inherent limitations of voice recognition software.     Katerra Ingman, Canary Brim, MD 05/06/20 [phone]

## 2020-05-05 NOTE — ED Triage Notes (Signed)
86 yo male BIBA from home c/o fever x 4 days  And weakness. No other complaints at this time. Pt has history of afib and htn. EMS reports no medicaitons taken in the past 24 hrs. AOx4 per EMS   Vitals:  124/75 bp 120 hr 96 % on room air  101.1 temp (temporal) 205 cbg

## 2020-05-06 ENCOUNTER — Encounter (HOSPITAL_COMMUNITY): Payer: Self-pay | Admitting: Internal Medicine

## 2020-05-06 DIAGNOSIS — I252 Old myocardial infarction: Secondary | ICD-10-CM | POA: Diagnosis not present

## 2020-05-06 DIAGNOSIS — I480 Paroxysmal atrial fibrillation: Secondary | ICD-10-CM | POA: Diagnosis present

## 2020-05-06 DIAGNOSIS — N179 Acute kidney failure, unspecified: Secondary | ICD-10-CM

## 2020-05-06 DIAGNOSIS — H9190 Unspecified hearing loss, unspecified ear: Secondary | ICD-10-CM | POA: Diagnosis present

## 2020-05-06 DIAGNOSIS — E669 Obesity, unspecified: Secondary | ICD-10-CM | POA: Diagnosis present

## 2020-05-06 DIAGNOSIS — J1282 Pneumonia due to coronavirus disease 2019: Secondary | ICD-10-CM | POA: Diagnosis present

## 2020-05-06 DIAGNOSIS — E119 Type 2 diabetes mellitus without complications: Secondary | ICD-10-CM

## 2020-05-06 DIAGNOSIS — L03032 Cellulitis of left toe: Secondary | ICD-10-CM | POA: Diagnosis present

## 2020-05-06 DIAGNOSIS — U071 COVID-19: Secondary | ICD-10-CM

## 2020-05-06 DIAGNOSIS — J189 Pneumonia, unspecified organism: Secondary | ICD-10-CM | POA: Diagnosis present

## 2020-05-06 DIAGNOSIS — L03031 Cellulitis of right toe: Secondary | ICD-10-CM | POA: Diagnosis present

## 2020-05-06 DIAGNOSIS — I5032 Chronic diastolic (congestive) heart failure: Secondary | ICD-10-CM | POA: Diagnosis present

## 2020-05-06 DIAGNOSIS — E785 Hyperlipidemia, unspecified: Secondary | ICD-10-CM | POA: Diagnosis present

## 2020-05-06 DIAGNOSIS — I4891 Unspecified atrial fibrillation: Secondary | ICD-10-CM

## 2020-05-06 DIAGNOSIS — A0472 Enterocolitis due to Clostridium difficile, not specified as recurrent: Secondary | ICD-10-CM | POA: Diagnosis present

## 2020-05-06 DIAGNOSIS — N1832 Chronic kidney disease, stage 3b: Secondary | ICD-10-CM | POA: Diagnosis present

## 2020-05-06 DIAGNOSIS — I2510 Atherosclerotic heart disease of native coronary artery without angina pectoris: Secondary | ICD-10-CM | POA: Diagnosis present

## 2020-05-06 DIAGNOSIS — E1122 Type 2 diabetes mellitus with diabetic chronic kidney disease: Secondary | ICD-10-CM | POA: Diagnosis present

## 2020-05-06 DIAGNOSIS — I714 Abdominal aortic aneurysm, without rupture: Secondary | ICD-10-CM | POA: Diagnosis present

## 2020-05-06 DIAGNOSIS — Z66 Do not resuscitate: Secondary | ICD-10-CM | POA: Diagnosis present

## 2020-05-06 DIAGNOSIS — Z9049 Acquired absence of other specified parts of digestive tract: Secondary | ICD-10-CM | POA: Diagnosis not present

## 2020-05-06 DIAGNOSIS — A4189 Other specified sepsis: Secondary | ICD-10-CM | POA: Diagnosis present

## 2020-05-06 DIAGNOSIS — J9601 Acute respiratory failure with hypoxia: Secondary | ICD-10-CM | POA: Diagnosis not present

## 2020-05-06 DIAGNOSIS — E872 Acidosis: Secondary | ICD-10-CM | POA: Diagnosis present

## 2020-05-06 DIAGNOSIS — I10 Essential (primary) hypertension: Secondary | ICD-10-CM

## 2020-05-06 DIAGNOSIS — I130 Hypertensive heart and chronic kidney disease with heart failure and stage 1 through stage 4 chronic kidney disease, or unspecified chronic kidney disease: Secondary | ICD-10-CM | POA: Diagnosis present

## 2020-05-06 DIAGNOSIS — Z96611 Presence of right artificial shoulder joint: Secondary | ICD-10-CM | POA: Diagnosis present

## 2020-05-06 LAB — CBG MONITORING, ED
Glucose-Capillary: 126 mg/dL — ABNORMAL HIGH (ref 70–99)
Glucose-Capillary: 123 mg/dL — ABNORMAL HIGH (ref 70–99)
Glucose-Capillary: 126 mg/dL — ABNORMAL HIGH (ref 70–99)
Glucose-Capillary: 126 mg/dL — ABNORMAL HIGH (ref 70–99)
Glucose-Capillary: 117 mg/dL — ABNORMAL HIGH (ref 70–99)

## 2020-05-06 LAB — URINALYSIS, ROUTINE W REFLEX MICROSCOPIC
Bacteria, UA: NONE SEEN
Bilirubin Urine: NEGATIVE
Glucose, UA: NEGATIVE mg/dL
Hgb urine dipstick: NEGATIVE
Ketones, ur: 5 mg/dL — AB
Leukocytes,Ua: NEGATIVE
Nitrite: NEGATIVE
Protein, ur: 30 mg/dL — AB
Specific Gravity, Urine: 1.018 (ref 1.005–1.030)
pH: 5 (ref 5.0–8.0)

## 2020-05-06 LAB — GLUCOSE, CAPILLARY: Glucose-Capillary: 140 mg/dL — ABNORMAL HIGH (ref 70–99)

## 2020-05-06 LAB — C DIFFICILE QUICK SCREEN W PCR REFLEX
C Diff antigen: POSITIVE — AB
C Diff interpretation: DETECTED
C Diff toxin: POSITIVE — AB

## 2020-05-06 LAB — LACTIC ACID, PLASMA: Lactic Acid, Venous: 1.6 mmol/L (ref 0.5–1.9)

## 2020-05-06 LAB — PROCALCITONIN: Procalcitonin: 0.18 ng/mL

## 2020-05-06 LAB — HEMOGLOBIN A1C
Hgb A1c MFr Bld: 7.2 % — ABNORMAL HIGH (ref 4.8–5.6)
Mean Plasma Glucose: 159.94 mg/dL

## 2020-05-06 LAB — FERRITIN: Ferritin: 440 ng/mL — ABNORMAL HIGH (ref 24–336)

## 2020-05-06 LAB — TROPONIN I (HIGH SENSITIVITY)
Troponin I (High Sensitivity): 15 ng/L (ref <18)
Troponin I (High Sensitivity): 16 ng/L (ref <18)

## 2020-05-06 LAB — C-REACTIVE PROTEIN: CRP: 6.6 mg/dL — ABNORMAL HIGH (ref <1.0)

## 2020-05-06 LAB — LACTATE DEHYDROGENASE: LDH: 212 U/L — ABNORMAL HIGH (ref 98–192)

## 2020-05-06 MED ORDER — SIMVASTATIN 10 MG PO TABS
10.0000 mg | ORAL_TABLET | Freq: Every day | ORAL | Status: DC
  Administered 2020-05-06 – 2020-05-10 (×5): 10 mg via ORAL
  Filled 2020-05-06 (×5): qty 1

## 2020-05-06 MED ORDER — LACTATED RINGERS IV SOLN: INTRAVENOUS | Status: DC

## 2020-05-06 MED ORDER — TRAMADOL HCL 50 MG PO TABS: 100.0000 mg | ORAL_TABLET | Freq: Four times a day (QID) | ORAL | Status: DC | PRN

## 2020-05-06 MED ORDER — CARVEDILOL 6.25 MG PO TABS
6.2500 mg | ORAL_TABLET | Freq: Every day | ORAL | Status: DC
  Administered 2020-05-06 – 2020-05-07 (×2): 6.25 mg via ORAL
  Filled 2020-05-06: qty 1
  Filled 2020-05-06: qty 2

## 2020-05-06 MED ORDER — HYDROCODONE-ACETAMINOPHEN 5-325 MG PO TABS
1.0000 | ORAL_TABLET | ORAL | Status: DC | PRN
  Administered 2020-05-06: 2 via ORAL
  Filled 2020-05-06: qty 2

## 2020-05-06 MED ORDER — SODIUM CHLORIDE 0.9 % IV SOLN: 100.0000 mg | Freq: Every day | INTRAVENOUS | Status: DC

## 2020-05-06 MED ORDER — SODIUM CHLORIDE 0.9 % IV SOLN
100.0000 mg | Freq: Every day | INTRAVENOUS | Status: AC
  Administered 2020-05-07 – 2020-05-10 (×4): 100 mg via INTRAVENOUS
  Filled 2020-05-06 (×4): qty 20

## 2020-05-06 MED ORDER — INSULIN ASPART 100 UNIT/ML ~~LOC~~ SOLN
0.0000 [IU] | SUBCUTANEOUS | Status: DC
  Administered 2020-05-06 – 2020-05-07 (×10): 1 [IU] via SUBCUTANEOUS
  Administered 2020-05-08 – 2020-05-09 (×4): 2 [IU] via SUBCUTANEOUS
  Administered 2020-05-10: 1 [IU] via SUBCUTANEOUS
  Administered 2020-05-10: 2 [IU] via SUBCUTANEOUS
  Filled 2020-05-06: qty 0.09

## 2020-05-06 MED ORDER — SODIUM CHLORIDE 0.9 % IV SOLN: INTRAVENOUS | Status: DC

## 2020-05-06 MED ORDER — VANCOMYCIN 50 MG/ML ORAL SOLUTION
125.0000 mg | Freq: Four times a day (QID) | ORAL | Status: DC
  Administered 2020-05-06 – 2020-05-10 (×17): 125 mg via ORAL
  Filled 2020-05-06 (×19): qty 2.5

## 2020-05-06 MED ORDER — LORATADINE 10 MG PO TABS
10.0000 mg | ORAL_TABLET | Freq: Every day | ORAL | Status: DC
  Administered 2020-05-06 – 2020-05-10 (×5): 10 mg via ORAL
  Filled 2020-05-06 (×5): qty 1

## 2020-05-06 MED ORDER — APIXABAN 2.5 MG PO TABS
2.5000 mg | ORAL_TABLET | Freq: Two times a day (BID) | ORAL | Status: DC
  Administered 2020-05-06 – 2020-05-10 (×9): 2.5 mg via ORAL
  Filled 2020-05-06 (×9): qty 1

## 2020-05-06 MED ORDER — ONDANSETRON HCL 4 MG/2ML IJ SOLN
4.0000 mg | Freq: Four times a day (QID) | INTRAMUSCULAR | Status: DC | PRN
  Administered 2020-05-06: 4 mg via INTRAVENOUS
  Filled 2020-05-06: qty 2

## 2020-05-06 MED ORDER — SODIUM CHLORIDE 0.9 % IV SOLN
100.0000 mg | INTRAVENOUS | Status: AC
  Administered 2020-05-06 (×2): 100 mg via INTRAVENOUS
  Filled 2020-05-06: qty 20

## 2020-05-06 MED ORDER — MECLIZINE HCL 25 MG PO TABS
25.0000 mg | ORAL_TABLET | Freq: Two times a day (BID) | ORAL | Status: DC
  Administered 2020-05-06 – 2020-05-10 (×9): 25 mg via ORAL
  Filled 2020-05-06 (×9): qty 1

## 2020-05-06 MED ORDER — DEXAMETHASONE SODIUM PHOSPHATE 10 MG/ML IJ SOLN
6.0000 mg | INTRAMUSCULAR | Status: DC
  Administered 2020-05-06: 6 mg via INTRAVENOUS
  Filled 2020-05-06: qty 1

## 2020-05-06 MED ORDER — ACETAMINOPHEN 325 MG PO TABS
650.0000 mg | ORAL_TABLET | Freq: Four times a day (QID) | ORAL | Status: DC | PRN
  Filled 2020-05-06: qty 2

## 2020-05-06 MED ORDER — SODIUM CHLORIDE 0.9% FLUSH
3.0000 mL | Freq: Two times a day (BID) | INTRAVENOUS | Status: DC
  Administered 2020-05-06 – 2020-05-10 (×8): 3 mL via INTRAVENOUS

## 2020-05-06 MED ORDER — ONDANSETRON HCL 4 MG PO TABS: 4.0000 mg | ORAL_TABLET | Freq: Four times a day (QID) | ORAL | Status: DC | PRN

## 2020-05-06 MED ORDER — SODIUM CHLORIDE 0.9 % IV SOLN
100.0000 mg | Freq: Once | INTRAVENOUS | Status: DC
  Filled 2020-05-06: qty 20

## 2020-05-06 NOTE — ED Notes (Addendum)
Pt family member opens door, "states her dad is cold to nurse.Nurse entered room, performed temp check, temp 99.6, one blanket given.  Pt family became upset with just one blanket, says are you just doing to let him freeze. I explained I will check to see if we can get tylenol. Pt family then gets upset " states say it to him not me I don't want to talk to you". Nurse explained to pt what was going on and we hope to get pt up in the next 30 minutes. Pt family then changes her mind and wants more information and wants conversation repeated. Nurse states you just wanted me to talk to him, and pt family member became angry and told nurse to leave and get out. States nurse was being a [employer]. Nurse left room as directed. Family member very mean and states she will report the nurse.

## 2020-05-06 NOTE — ED Notes (Signed)
Nurse along with secondary nurse, and NT assisted pt with toileting needs. Pt was cleaned and linens were changed as well. Pt repositioned in bed, call bell within reach.

## 2020-05-06 NOTE — ED Notes (Signed)
Patient placed in hospital bed for comfort.

## 2020-05-06 NOTE — ED Notes (Signed)
Enter pt room, pt family frustrated. Nurse introduced self, asked " have you been informed on the care of your dad? Pt stated yes,  Family member wearing protect PPE as instructed from previous nurse Informed family member you must wash her hands every time she touches pt with soap and warm water for at least 2 minutes.   Performed ADL cleaning along with care tech.  Reassess v/s. Temp noted at 99.6.  Tech removed blankets and gave a light sheet for comfort. Family member " verbalizes her daddy gets cold". Nurse explained why blankets were removed, family became very angry and states everyone is so defensive. Charge and [facility] notified pt family member frustrated and angry.

## 2020-05-06 NOTE — Progress Notes (Signed)
PROGRESS NOTE  Charles Richard  GGY:[id number] DOB: 11/09/1930 DOA: 05/05/2020 [facility]: [facility]  Outpatient Specialists: Urology, Dr. Mena Goes Brief Narrative: Charles Richard is an 86 y.o. male with a history of T2DM, HTN, HLD, CVA, AFib, stage III CKD, AAA and poorly healing wounds on recent course of amoxicillin who presented to the ED with 4-5 days of loose stools, cough, and progressively worsening generalized weakness. He's not been eating well, lives with his daughter and SIL for several years, both of whom have URI symptoms. In the ED he was febrile to 102.74F, tachycardic to 116bpm, tachypneic to 30/min without hypoxemia. SARS-CoV-2 PCR was positive and testing was positive for toxigenic C. diff. Remdesivir and oral vancomycin have been started.  Assessment & Plan: Active Problems:   Essential hypertension   Type 2 diabetes mellitus without complication (HCC)   AKI (acute kidney injury) (HCC)   Atrial fibrillation with RVR (HCC)   AAA (abdominal aortic aneurysm) without rupture (HCC)   COVID-19 virus infection   Pneumonia due to COVID-19 virus  Covid-19 pneumonia: SARS-CoV-2 PCR positive on 5/26.  - Start remdesivir x5 days (5/27 [received 100mg  x2 separately] - 5/31) - Steroid given, though will hold with other active infection, diabetes, and no hypoxemia. - Vitamin C, zinc - Encourage OOB, IS, FV, and awake proning if becomes hypoxemic. - Tylenol and antitussives prn - Continue airborne, contact precautions. Isolation period is 21 days from positive testing. - Check CBC w/diff, CMP, CRP (mildly elevated at 6.6 at admission) daily - Confirmed patient is DNR on admission.  C. difficile colitis  - Initial episode without severe features. Vancomycin 125 mg po QID x 10 days. - Enteric precautions - Continue to hydrate, monitor fluid volume status and replace electrolytes as needed. - Avoid PPIs and additional antibiotics.  AST elevation: Mild, due to covid.  - Monitor.  ALT wnl, no contraindication to remdesivir, etc.   CAD: No chest pain, troponin not elevated.  - Continue coreg, eliquis, statin  T2DM:  - SSI  Atrial fibrillation with RVR: Initially on presentation reported RVR, since improved. Follows with Dr. 6/31. - Continue eliquis, coreg  AKI on stage IIIb CKD: Putative staging based on available SCr.  - Continue IVF due to profuse diarrhea, will monitor closely to avoid pulmonary volume overload, currently volume down.  HTN:  - Hold lisinopril with AKI.   AAA:  - Continue surveillance as outpatient  Cellulitis, poorly healing wounds to bilateral great toes: Good granulation tissue on both, nontender, no exudate. Has completed amoxicillin x14 days just PTA.  - Follow up with outpatient providers for further vascular studies and continue wound care.  - Continue santyl, offloading  DVT prophylaxis: Eliquis Code Status: DNR Family Communication: Daughter  Disposition Plan:  Status is: Inpatient  Remains inpatient appropriate because:Ongoing diagnostic testing needed not appropriate for outpatient work up and IV treatments appropriate due to intensity of illness or inability to take PO   Dispo: The patient is from: Home              Anticipated d/c is to: TBD              Anticipated d/c date is: 3 days              Patient currently is not medically stable to d/c.  Consultants:   None  Procedures:   None  Antimicrobials:  Vancomycin, cefepime, flagyl 5/26  Remdesivir 5/27 - 5/31  Oral vancomycin 5/27 - 6/5  Subjective: Pt  very HOH but with loud yelling, he responds appropriately, denies any chest pain, shortness of breath, having a dry cough, but primary complaint is clearly diarrhea, having had 3 episodes of thin watery stools in the past 4 hours. Denies abdominal pain or fever.   Objective: Vitals:   05/06/20 1400 05/06/20 1430 05/06/20 1500 05/06/20 1530  BP: 119/87 138/90 124/79 133/67  Pulse: 95 96 89 91   Resp: (!) 27 (!) 25 19 (!) 21  Temp:      TempSrc:      SpO2: 95% 95% 98% 98%  Weight:      Height:        Intake/Output Summary (Last 24 hours) at 05/06/2020 1537 Last data filed at 05/06/2020 1054 Gross per 24 hour  Intake 3704.77 ml  Output 200 ml  Net 3504.77 ml   Filed Weights   05/05/20 2049  Weight: 97.5 kg    Gen: Elderly male in no acute distress Pulm: Non-labored breathing room air, crackles at bases.  CV: Irreg irreg. No murmur, rub, or gallop. No JVD, no pedal edema. GI: Abdomen soft, not at all tender, non-distended, with normoactive bowel sounds. No organomegaly or masses felt. Ext: Warm, no deformities Skin: Has light erythema with central small wound with granulation tissue on dorsal/medial great toes bilaterally without exudate or induration. No new rashes, lesions or ulcers Neuro: Alert and oriented. HOH, otherwise no focal neurological deficits. Psych: Judgement and insight appear normal. Mood & affect appropriate.   Data Reviewed: I have personally reviewed following labs and imaging studies  CBC: Recent Labs  Lab 05/05/20 1924  WBC 4.6  NEUTROABS 3.2  HGB 13.0  HCT 38.7*  MCV 98.7  PLT 157   Basic Metabolic Panel: Recent Labs  Lab 05/05/20 1924  NA 136  K 4.1  CL 104  CO2 22  GLUCOSE 241*  BUN 30*  CREATININE 1.99*  CALCIUM 8.0*   GFR: Estimated Creatinine Clearance: 31.7 mL/min (A) (by C-G formula based on SCr of 1.99 mg/dL (H)). Liver Function Tests: Recent Labs  Lab 05/05/20 1924  AST 56*  ALT 36  ALKPHOS 33*  BILITOT 0.3  PROT 6.4*  ALBUMIN 3.0*   Recent Labs  Lab 05/05/20 1953  LIPASE 40   No results for input(s): AMMONIA in the last 168 hours. Coagulation Profile: Recent Labs  Lab 05/05/20 1924  INR 1.1   Cardiac Enzymes: No results for input(s): CKTOTAL, CKMB, CKMBINDEX, TROPONINI in the last 168 hours. BNP (last 3 results) No results for input(s): PROBNP in the last 8760 hours. HbA1C: Recent Labs     05/05/20 1924  HGBA1C 7.2*   CBG: Recent Labs  Lab 05/06/20 0223 05/06/20 0432 05/06/20 0822 05/06/20 1246  GLUCAP 126* 123* 126* 126*   Lipid Profile: No results for input(s): CHOL, HDL, LDLCALC, TRIG, CHOLHDL, LDLDIRECT in the last 72 hours. Thyroid Function Tests: No results for input(s): TSH, T4TOTAL, FREET4, T3FREE, THYROIDAB in the last 72 hours. Anemia Panel: Recent Labs    05/06/20 0040  FERRITIN 440*   Urine analysis:    Component Value Date/Time   COLORURINE YELLOW 05/06/2020 0040   APPEARANCEUR HAZY (A) 05/06/2020 0040   LABSPEC 1.018 05/06/2020 0040   PHURINE 5.0 05/06/2020 0040   GLUCOSEU NEGATIVE 05/06/2020 0040   HGBUR NEGATIVE 05/06/2020 0040   BILIRUBINUR NEGATIVE 05/06/2020 0040   KETONESUR 5 (A) 05/06/2020 0040   PROTEINUR 30 (A) 05/06/2020 0040   UROBILINOGEN 0.2 05/19/2009 0733   NITRITE NEGATIVE 05/06/2020 0040  LEUKOCYTESUR NEGATIVE 05/06/2020 0040   Recent Results (from the past 240 hour(s))  Culture, blood (Routine x 2)     Status: None (Preliminary result)   Collection Time: 05/05/20  7:24 PM   Specimen: BLOOD  Result Value Ref Range Status   Specimen Description   Final    BLOOD BLOOD RIGHT WRIST Performed at [facility] [address], [place], [place] [address]    Special Requests   Final    BOTTLES DRAWN AEROBIC AND ANAEROBIC Blood Culture adequate volume Performed at [facility] [address] [place], [place] [address]    Culture   Final    NO GROWTH < 12 HOURS Performed at [facility] [address] [place], [place] [address]    Report Status PENDING  Incomplete  Culture, blood (Routine x 2)     Status: None (Preliminary result)   Collection Time: 05/05/20  7:24 PM   Specimen: BLOOD  Result Value Ref Range Status   Specimen Description   Final    BLOOD LEFT WRIST Performed at [facility] [address], [place], [place] [address]    Special  Requests   Final    BOTTLES DRAWN AEROBIC AND ANAEROBIC Blood Culture adequate volume Performed at [facility] [address], [place], [place] [address]    Culture   Final    NO GROWTH < 12 HOURS Performed at [facility] [address], [place], [place] [address]    Report Status PENDING  Incomplete  SARS Coronavirus 2 by RT PCR (hospital order, performed in [facility] hospital lab) Nasopharyngeal Nasopharyngeal Swab     Status: Abnormal   Collection Time: 05/05/20  8:40 PM   Specimen: Nasopharyngeal Swab  Result Value Ref Range Status   SARS Coronavirus 2 POSITIVE (A) NEGATIVE Final    Comment: RESULT CALLED TO, READ BACK BY AND VERIFIED WITH: SMITH,J @ 2259 ON [id number] BY POTEAT,S (NOTE) SARS-CoV-2 target nucleic acids are DETECTED SARS-CoV-2 RNA is generally detectable in upper respiratory specimens  during the acute phase of infection.  Positive results are indicative  of the presence of the identified virus, but do not rule out bacterial infection or co-infection with other pathogens not detected by the test.  Clinical correlation with patient history and  other diagnostic information is necessary to determine patient infection status.  The expected result is negative. Fact Sheet for Patients:   StrictlyIdeas.no  Fact Sheet for Healthcare Providers:   BankingDealers.co.za   This test is not yet approved or cleared by the [place] FDA and  has been authorized for detection and/or diagnosis of SARS-CoV-2 by FDA under an Emergency Use Authorization (EUA).  This EUA will remain in effect (meaning this test can  be used) for the duration of  the COVID-19 declaration under Section 564(b)(1) of the Act, 21 U.S.C. section 360-bbb-3(b)(1), unless the authorization is terminated or revoked sooner. Performed at [facility], [place] [address], [place], [place] [address]   C Difficile Quick  Screen w PCR reflex     Status: Abnormal   Collection Time: 05/06/20 10:49 AM  Result Value Ref Range Status   C Diff antigen POSITIVE (A) NEGATIVE Final   C Diff toxin POSITIVE (A) NEGATIVE Final   C Diff interpretation Toxin producing C. difficile detected.  Final    Comment: CRITICAL RESULT CALLED TO, READ BACK BY AND VERIFIED WITH: WEST,S. RN @1513  05/06/20 BILLINGSLEY,L Performed  at [facility], [address], [place], [place] [address]       Radiology Studies: DG Chest 2 View  Result Date: 05/05/2020 CLINICAL DATA:  Fever, weakness for 4 days EXAM: CHEST - 2 VIEW COMPARISON:  05/09/2017 FINDINGS: Single frontal view of the chest demonstrates an unremarkable cardiac silhouette. Atherosclerosis of the aortic arch. Patchy consolidation at the left lung base could reflect atelectasis or airspace disease. Evaluation limited on this portable exam. No large effusion or pneumothorax. No acute bony abnormalities. IMPRESSION: 1. Patchy consolidation at the left lung base which may reflect atelectasis or airspace disease. PA and lateral views of the chest may be useful when clinical situation permits. Electronically Signed   By: Sharlet Salina M.D.   On: 05/05/2020 20:13    Scheduled Meds: . apixaban  2.5 mg Oral BID  . carvedilol  6.25 mg Oral Q breakfast  . dexamethasone (DECADRON) injection  6 mg Intravenous Q24H  . insulin aspart  0-9 Units Subcutaneous Q4H  . loratadine  10 mg Oral Q breakfast  . meclizine  25 mg Oral BID  . simvastatin  10 mg Oral Daily  . sodium chloride flush  3 mL Intravenous Q12H  . vancomycin  125 mg Oral QID   Continuous Infusions: . sodium chloride 50 mL/hr at 05/06/20 1454  . [START ON 05/07/2020] remdesivir 100 mg in NS 100 mL       LOS: 0 days   Time spent: 35 minutes.  Tyrone Nine, MD [facility] [email or web address] 05/06/2020, 3:37 PM

## 2020-05-06 NOTE — ED Notes (Signed)
Pt unable to provide urine sample. Nurse along with NT performed in and out cath 250 mls obtained. Pt brief changed, new brief placed along with double chucks under pt. Pt repositioned for comfort. Pt provided with warm blankets.

## 2020-05-06 NOTE — ED Notes (Signed)
Patient with multpile loose stools this morning.  Dr. Jarvis Newcomer made aware.  Sample sent to lab incase MD decides to test.

## 2020-05-06 NOTE — Sepsis Progress Note (Signed)
Notified bedside nurse of need to draw 2nd lactic acid. RN responded and will draw it now.

## 2020-05-06 NOTE — ED Notes (Signed)
Patient given meal tray but didn't want anything on it but his milk.

## 2020-05-06 NOTE — ED Notes (Signed)
CRITICAL VALUE ALERT  Critical Value:  C.diff positive  Date & Time Notied:  05/06/2020 1515  Provider Notified: Jarvis Newcomer, MD  Orders Received/Actions taken: No new orders received at this time

## 2020-05-07 LAB — D-DIMER, QUANTITATIVE: D-Dimer, Quant: 1.03 ug/mL-FEU — ABNORMAL HIGH (ref 0.00–0.50)

## 2020-05-07 LAB — CBC WITH DIFFERENTIAL/PLATELET
Abs Immature Granulocytes: 0.03 10*3/uL (ref 0.00–0.07)
Basophils Absolute: 0 10*3/uL (ref 0.0–0.1)
Basophils Relative: 0 %
Eosinophils Absolute: 0 10*3/uL (ref 0.0–0.5)
Eosinophils Relative: 0 %
HCT: 35.7 % — ABNORMAL LOW (ref 39.0–52.0)
Hemoglobin: 11.7 g/dL — ABNORMAL LOW (ref 13.0–17.0)
Immature Granulocytes: 1 %
Lymphocytes Relative: 26 %
Lymphs Abs: 1.4 10*3/uL (ref 0.7–4.0)
MCH: 32.3 pg (ref 26.0–34.0)
MCHC: 32.8 g/dL (ref 30.0–36.0)
MCV: 98.6 fL (ref 80.0–100.0)
Monocytes Absolute: 0.5 10*3/uL (ref 0.1–1.0)
Monocytes Relative: 9 %
Neutro Abs: 3.3 10*3/uL (ref 1.7–7.7)
Neutrophils Relative %: 64 %
Platelets: 172 10*3/uL (ref 150–400)
RBC: 3.62 MIL/uL — ABNORMAL LOW (ref 4.22–5.81)
RDW: 12.8 % (ref 11.5–15.5)
WBC: 5.3 10*3/uL (ref 4.0–10.5)
nRBC: 0 % (ref 0.0–0.2)

## 2020-05-07 LAB — GLUCOSE, CAPILLARY
Glucose-Capillary: 107 mg/dL — ABNORMAL HIGH (ref 70–99)
Glucose-Capillary: 127 mg/dL — ABNORMAL HIGH (ref 70–99)
Glucose-Capillary: 128 mg/dL — ABNORMAL HIGH (ref 70–99)
Glucose-Capillary: 131 mg/dL — ABNORMAL HIGH (ref 70–99)
Glucose-Capillary: 142 mg/dL — ABNORMAL HIGH (ref 70–99)
Glucose-Capillary: 147 mg/dL — ABNORMAL HIGH (ref 70–99)

## 2020-05-07 LAB — C-REACTIVE PROTEIN: CRP: 5.7 mg/dL — ABNORMAL HIGH (ref <1.0)

## 2020-05-07 LAB — ABO/RH: ABO/RH(D): A NEG

## 2020-05-07 LAB — COMPREHENSIVE METABOLIC PANEL
ALT: 33 U/L (ref 0–44)
AST: 47 U/L — ABNORMAL HIGH (ref 15–41)
Albumin: 2.7 g/dL — ABNORMAL LOW (ref 3.5–5.0)
Alkaline Phosphatase: 27 U/L — ABNORMAL LOW (ref 38–126)
Anion gap: 9 (ref 5–15)
BUN: 23 mg/dL (ref 8–23)
CO2: 20 mmol/L — ABNORMAL LOW (ref 22–32)
Calcium: 7.6 mg/dL — ABNORMAL LOW (ref 8.9–10.3)
Chloride: 110 mmol/L (ref 98–111)
Creatinine, Ser: 1.64 mg/dL — ABNORMAL HIGH (ref 0.61–1.24)
GFR calc Af Amer: 43 mL/min — ABNORMAL LOW (ref >60)
GFR calc non Af Amer: 37 mL/min — ABNORMAL LOW (ref >60)
Glucose, Bld: 128 mg/dL — ABNORMAL HIGH (ref 70–99)
Potassium: 3.5 mmol/L (ref 3.5–5.1)
Sodium: 139 mmol/L (ref 135–145)
Total Bilirubin: 0.6 mg/dL (ref 0.3–1.2)
Total Protein: 5.8 g/dL — ABNORMAL LOW (ref 6.5–8.1)

## 2020-05-07 LAB — URINE CULTURE: Culture: NO GROWTH

## 2020-05-07 LAB — FERRITIN: Ferritin: 389 ng/mL — ABNORMAL HIGH (ref 24–336)

## 2020-05-07 LAB — MAGNESIUM: Magnesium: 1.4 mg/dL — ABNORMAL LOW (ref 1.7–2.4)

## 2020-05-07 MED ORDER — MAGNESIUM SULFATE 2 GM/50ML IV SOLN
2.0000 g | Freq: Once | INTRAVENOUS | Status: AC
  Administered 2020-05-07: 2 g via INTRAVENOUS
  Filled 2020-05-07: qty 50

## 2020-05-07 MED ORDER — POTASSIUM CHLORIDE 2 MEQ/ML IV SOLN
INTRAVENOUS | Status: DC
  Filled 2020-05-07 (×3): qty 1000

## 2020-05-07 NOTE — Progress Notes (Signed)
Patient alert/oriented, No distress noted and patient denies any distress. Heart rate elevated with activity and back  Down at rest.  Y[facility] MEWs protocol initiated charge nurse Pam notified. Will continue to assess patient.

## 2020-05-07 NOTE — Progress Notes (Addendum)
PROGRESS NOTE  Charles Richard  XBM:[id number] DOB: [birth year] DOA: 05/05/2020 [facility]: [facility]  Outpatient Specialists: Urology, Dr. Mena Goes Brief Narrative: Charles Richard is an 86 y.o. male with a history of T2DM, HTN, HLD, CVA, AFib, stage III CKD, AAA and poorly healing wounds on recent course of amoxicillin who presented to the ED with 4-5 days of loose stools, cough, and progressively worsening generalized weakness. He's not been eating well, lives with his daughter and SIL for several years, both of whom have URI symptoms. In the ED he was febrile to 102.24F, tachycardic to 116bpm, tachypneic to 30/min without hypoxemia. SARS-CoV-2 PCR was positive and testing was positive for toxigenic C. diff. Remdesivir and oral vancomycin have been started.  Assessment & Plan: Active Problems:   Essential hypertension   Type 2 diabetes mellitus without complication (HCC)   AKI (acute kidney injury) (HCC)   Atrial fibrillation with RVR (HCC)   AAA (abdominal aortic aneurysm) without rupture (HCC)   COVID-19 virus infection   Pneumonia due to COVID-19 virus  Sepsis due to C. diff colitis and covid-19 pneumonia.  - Treat as below  Covid-19 pneumonia: SARS-CoV-2 PCR positive on 5/26.  - Continue remdesivir x5 days (5/27 [received 100mg  x2 separately] - 5/31). With C. diff, he's not a candidate to return for outpatient infusions and continues to have significant symptom burden. - Steroid given, though will hold with other active infection, diabetes, and no hypoxemia. - Vitamin C, zinc - Encourage OOB, IS, FV, and awake proning if becomes hypoxemic. - Tylenol and antitussives prn - Continue airborne, contact precautions. Isolation period is 21 days from positive testing. - Check CBC w/diff, CMP, CRP (mildly elevated at 6.6 at admission, improving) daily - Confirmed patient is DNR on admission. This 86yo diabetic male remains at high risk for decompensation, as illness course is approaching  2nd week. - Typical visitation restrictions apply, will attempt to maximize FaceTime with his daughter digitally as he's at high risk for delirium.  C. difficile colitis  - Initial episode without severe features. Vancomycin 125 mg po QID x 10 days. - Enteric precautions - Continue to hydrate, monitor fluid volume status and replace electrolytes as below. Change to LR w/NAGMA. Add potassium due to decrease from yesterday, ongoing GI losses and inadequate po intake. - Avoid PPIs and additional antibiotics.  Hypomagnesemia:  - Supplement and monitor  AST elevation: Mild, due to covid. Improving. ALT remains wnl, no contraindication to remdesivir, etc.  - Monitor intermittently. Next 5/30  CAD: No chest pain, troponin not elevated.  - Continue coreg, eliquis, statin  Chronic HFpEF: Not overloaded.  - Continue home medication, ok to continue IV fluid for right now. Reassess daily.  T2DM: HbA1c 7.2%. - SSI, at inpatient goal    Atrial fibrillation with RVR: Initially on presentation reported RVR, since improved, now in NSR on 5/28. Follows with Dr. 6/28. - Continue eliquis, coreg  AKI on stage IIIb CKD: Putative staging based on available SCr.  - Continue IVF (hx LVEF 60-65%) due to ongoing diarrhea, will monitor closely to avoid pulmonary volume overload. Aiming to minimize fluid resuscitation but po intake is minimal.  HTN:  - Holding lisinopril with AKI.  - Continue coreg, monitor BP.  AAA:  - Continue surveillance as outpatient  Cellulitis, poorly healing wounds to bilateral great toes: Good granulation tissue on both, nontender, no exudate. Has completed amoxicillin x14 days just PTA.  - Follow up with outpatient providers for further vascular studies and continue wound  care.  - Continue santyl, offloading  DVT prophylaxis: Eliquis Code Status: DNR Family Communication: Daughter by phone Disposition Plan:  Status is: Inpatient  Remains inpatient appropriate  because:Ongoing diagnostic testing needed not appropriate for outpatient work up and IV treatments appropriate due to intensity of illness or inability to take PO. PT and OT evaluation requested, in speaking with his daughter I shared my fear that this 86yo M w/covid and CDiff who was walker-dependent at baseline may require SNF prior to returning home.   Dispo: The patient is from: Home              Anticipated d/c is to: TBD              Anticipated d/c date is: 3 days              Patient currently is not medically stable to d/c.  Consultants:   None  Procedures:   None  Antimicrobials:  Vancomycin, cefepime, flagyl 5/26  Remdesivir 5/27 - 5/31  Oral vancomycin 5/27 - 6/5  Subjective: Very HOH, so communication is limited by that despite wearing hearing aid, by being in loud negative pressure room, and N95 mask. He had 3 liquid stools overnight, one this morning. Breakfast at bedside hasn't been touched, says he's not hungry, denies nausea or vomiting. No abdominal pain currently, no dyspnea or chest pain either. Wants to go home ASAP.  Objective: Vitals:   05/07/20 0415 05/07/20 0615 05/07/20 0750 05/07/20 0942  BP: (!) 146/83 (!) 145/98 (!) 144/88   Pulse: (!) 104 (!) 102 (!) 105   Resp: (!) 23 18 19    Temp: 99.3 F (37.4 C) 99 F (37.2 C) 98.9 F (37.2 C)   TempSrc: Oral Oral Oral   SpO2: 98% 97% 97% 97%  Weight:      Height:        Intake/Output Summary (Last 24 hours) at 05/07/2020 1205 Last data filed at 05/07/2020 0534 Gross per 24 hour  Intake 1035.07 ml  Output 300 ml  Net 735.07 ml   Filed Weights   05/05/20 2049  Weight: 97.5 kg   Gen: 86 y.o. male in no distress Pulm: Nonlabored breathing room air. Crackles at bases, good aeration. CV: Regular on my exam. No murmur, rub, or gallop. No JVD, no pitting dependent edema. GI: Abdomen soft, minimally tender, protuberant but non-distended, with normoactive bowel sounds.  Ext: Warm, no deformities Skin:  No new rashes, lesions or ulcers on visualized skin. Neuro: Alert and oriented. Very HOH, moves all extremities without focal neurological deficits. Psych: Judgement and insight appear intact. Mood euthymic & affect congruent. Behavior is appropriate.    Data Reviewed: I have personally reviewed following labs and imaging studies  CBC: Recent Labs  Lab 05/05/20 1924 05/07/20 0438  WBC 4.6 5.3  NEUTROABS 3.2 3.3  HGB 13.0 11.7*  HCT 38.7* 35.7*  MCV 98.7 98.6  PLT 157 172   Basic Metabolic Panel: Recent Labs  Lab 05/05/20 1924 05/07/20 0438  NA 136 139  K 4.1 3.5  CL 104 110  CO2 22 20*  GLUCOSE 241* 128*  BUN 30* 23  CREATININE 1.99* 1.64*  CALCIUM 8.0* 7.6*  MG  --  1.4*   GFR: Estimated Creatinine Clearance: 38.4 mL/min (A) (by C-G formula based on SCr of 1.64 mg/dL (H)). Liver Function Tests: Recent Labs  Lab 05/05/20 1924 05/07/20 0438  AST 56* 47*  ALT 36 33  ALKPHOS 33* 27*  BILITOT 0.3 0.6  PROT 6.4* 5.8*  ALBUMIN 3.0* 2.7*   Recent Labs  Lab 05/05/20 1953  LIPASE 40   No results for input(s): AMMONIA in the last 168 hours. Coagulation Profile: Recent Labs  Lab 05/05/20 1924  INR 1.1   Cardiac Enzymes: No results for input(s): CKTOTAL, CKMB, CKMBINDEX, TROPONINI in the last 168 hours. BNP (last 3 results) No results for input(s): PROBNP in the last 8760 hours. HbA1C: Recent Labs    05/05/20 1924  HGBA1C 7.2*   CBG: Recent Labs  Lab 05/06/20 1628 05/06/20 2149 05/07/20 0027 05/07/20 0409 05/07/20 0742  GLUCAP 117* 140* 142* 107* 128*   Lipid Profile: No results for input(s): CHOL, HDL, LDLCALC, TRIG, CHOLHDL, LDLDIRECT in the last 72 hours. Thyroid Function Tests: No results for input(s): TSH, T4TOTAL, FREET4, T3FREE, THYROIDAB in the last 72 hours. Anemia Panel: Recent Labs    05/06/20 0040 05/07/20 0438  FERRITIN 440* 389*   Urine analysis:    Component Value Date/Time   COLORURINE YELLOW 05/06/2020 0040    APPEARANCEUR HAZY (A) 05/06/2020 0040   LABSPEC 1.018 05/06/2020 0040   PHURINE 5.0 05/06/2020 0040   GLUCOSEU NEGATIVE 05/06/2020 0040   HGBUR NEGATIVE 05/06/2020 0040   BILIRUBINUR NEGATIVE 05/06/2020 0040   KETONESUR 5 (A) 05/06/2020 0040   PROTEINUR 30 (A) 05/06/2020 0040   UROBILINOGEN 0.2 05/19/2009 0733   NITRITE NEGATIVE 05/06/2020 0040   LEUKOCYTESUR NEGATIVE 05/06/2020 0040   Recent Results (from the past 240 hour(s))  Culture, blood (Routine x 2)     Status: None (Preliminary result)   Collection Time: 05/05/20  7:24 PM   Specimen: BLOOD  Result Value Ref Range Status   Specimen Description   Final    BLOOD BLOOD RIGHT WRIST Performed at [facility], [address], [place], [place] [address]    Special Requests   Final    BOTTLES DRAWN AEROBIC AND ANAEROBIC Blood Culture adequate volume Performed at [facility], [address], [place], [place] [address]    Culture   Final    NO GROWTH 2 DAYS Performed at [facility], [address] [place], [place] [address]    Report Status PENDING  Incomplete  Culture, blood (Routine x 2)     Status: None (Preliminary result)   Collection Time: 05/05/20  7:24 PM   Specimen: BLOOD  Result Value Ref Range Status   Specimen Description   Final    BLOOD LEFT WRIST Performed at [facility], [address], [place], [place] [address]    Special Requests   Final    BOTTLES DRAWN AEROBIC AND ANAEROBIC Blood Culture adequate volume Performed at [facility], [address] [place], [place] [address]    Culture   Final    NO GROWTH 2 DAYS Performed at [facility], [address] [place], [place] [address]    Report Status PENDING  Incomplete  SARS Coronavirus 2 by RT PCR (hospital order, performed in [facility] hospital lab) Nasopharyngeal Nasopharyngeal Swab     Status: Abnormal   Collection Time: 05/05/20  8:40 PM   Specimen:  Nasopharyngeal Swab  Result Value Ref Range Status   SARS Coronavirus 2 POSITIVE (A) NEGATIVE Final    Comment: RESULT CALLED TO, READ BACK BY AND VERIFIED WITH: SMITH,J @ 2259 ON [id number] BY POTEAT,S (NOTE) SARS-CoV-2 target nucleic acids are DETECTED SARS-CoV-2 RNA is generally detectable in upper respiratory specimens  during the acute phase  of infection.  Positive results are indicative  of the presence of the identified virus, but do not rule out bacterial infection or co-infection with other pathogens not detected by the test.  Clinical correlation with patient history and  other diagnostic information is necessary to determine patient infection status.  The expected result is negative. Fact Sheet for Patients:   StrictlyIdeas.no  Fact Sheet for Healthcare Providers:   BankingDealers.co.za   This test is not yet approved or cleared by the [place] FDA and  has been authorized for detection and/or diagnosis of SARS-CoV-2 by FDA under an Emergency Use Authorization (EUA).  This EUA will remain in effect (meaning this test can  be used) for the duration of  the COVID-19 declaration under Section 564(b)(1) of the Act, 21 U.S.C. section 360-bbb-3(b)(1), unless the authorization is terminated or revoked sooner. Performed at [facility], [place] [address], [place], [place] [address]   Urine culture     Status: None   Collection Time: 05/06/20 12:27 AM   Specimen: In/Out Cath Urine  Result Value Ref Range Status   Specimen Description   Final    IN/OUT CATH URINE Performed at [place] [address], [place], [place] [address]    Special Requests   Final    NONE Performed at [facility], [place] [address], [place], [place] [address]    Culture   Final    NO GROWTH Performed at [facility], [place] [address], [place], [place] [address]    Report Status 05/07/2020  FINAL  Final  C Difficile Quick Screen w PCR reflex     Status: Abnormal   Collection Time: 05/06/20 10:49 AM  Result Value Ref Range Status   C Diff antigen POSITIVE (A) NEGATIVE Final   C Diff toxin POSITIVE (A) NEGATIVE Final   C Diff interpretation Toxin producing C. difficile detected.  Final    Comment: CRITICAL RESULT CALLED TO, READ BACK BY AND VERIFIED WITH: WEST,S. RN @1513  05/06/20 BILLINGSLEY,L Performed at [facility], [place] [address], [place], [place] [address]       Radiology Studies: DG Chest 2 View  Result Date: 05/05/2020 CLINICAL DATA:  Fever, weakness for 4 days EXAM: CHEST - 2 VIEW COMPARISON:  05/09/2017 FINDINGS: Single frontal view of the chest demonstrates an unremarkable cardiac silhouette. Atherosclerosis of the aortic arch. Patchy consolidation at the left lung base could reflect atelectasis or airspace disease. Evaluation limited on this portable exam. No large effusion or pneumothorax. No acute bony abnormalities. IMPRESSION: 1. Patchy consolidation at the left lung base which may reflect atelectasis or airspace disease. PA and lateral views of the chest may be useful when clinical situation permits. Electronically Signed   By: Randa Ngo M.D.   On: 05/05/2020 20:13    Scheduled Meds: . apixaban  2.5 mg Oral BID  . carvedilol  6.25 mg Oral Q breakfast  . insulin aspart  0-9 Units Subcutaneous Q4H  . loratadine  10 mg Oral Q breakfast  . meclizine  25 mg Oral BID  . simvastatin  10 mg Oral Daily  . sodium chloride flush  3 mL Intravenous Q12H  . vancomycin  125 mg Oral QID   Continuous Infusions: . lactated ringers with kcl 100 mL/hr at 05/07/20 0950  . remdesivir 100 mg in NS 100 mL       LOS: 1 day   Time spent: 35 minutes.  Patrecia Pour, MD  [facility] [email or web address] 05/07/2020, 12:05 PM

## 2020-05-07 NOTE — Progress Notes (Signed)
Patient's heart rate in the 140s when he got up to use the bathroom at resting state down to 104, patient with hx of A-fib. Will continue to assess patient.

## 2020-05-08 LAB — CBC WITH DIFFERENTIAL/PLATELET
Abs Immature Granulocytes: 0.03 10*3/uL (ref 0.00–0.07)
Basophils Absolute: 0 10*3/uL (ref 0.0–0.1)
Basophils Relative: 0 %
Eosinophils Absolute: 0 10*3/uL (ref 0.0–0.5)
Eosinophils Relative: 1 %
HCT: 35.4 % — ABNORMAL LOW (ref 39.0–52.0)
Hemoglobin: 11.5 g/dL — ABNORMAL LOW (ref 13.0–17.0)
Immature Granulocytes: 1 %
Lymphocytes Relative: 27 %
Lymphs Abs: 1.5 10*3/uL (ref 0.7–4.0)
MCH: 32 pg (ref 26.0–34.0)
MCHC: 32.5 g/dL (ref 30.0–36.0)
MCV: 98.6 fL (ref 80.0–100.0)
Monocytes Absolute: 0.6 10*3/uL (ref 0.1–1.0)
Monocytes Relative: 11 %
Neutro Abs: 3.5 10*3/uL (ref 1.7–7.7)
Neutrophils Relative %: 60 %
Platelets: 179 10*3/uL (ref 150–400)
RBC: 3.59 MIL/uL — ABNORMAL LOW (ref 4.22–5.81)
RDW: 12.6 % (ref 11.5–15.5)
WBC: 5.7 10*3/uL (ref 4.0–10.5)
nRBC: 0 % (ref 0.0–0.2)

## 2020-05-08 LAB — BASIC METABOLIC PANEL
Anion gap: 9 (ref 5–15)
BUN: 19 mg/dL (ref 8–23)
CO2: 19 mmol/L — ABNORMAL LOW (ref 22–32)
Calcium: 7.5 mg/dL — ABNORMAL LOW (ref 8.9–10.3)
Chloride: 108 mmol/L (ref 98–111)
Creatinine, Ser: 1.27 mg/dL — ABNORMAL HIGH (ref 0.61–1.24)
GFR calc Af Amer: 58 mL/min — ABNORMAL LOW (ref >60)
GFR calc non Af Amer: 50 mL/min — ABNORMAL LOW (ref >60)
Glucose, Bld: 114 mg/dL — ABNORMAL HIGH (ref 70–99)
Potassium: 3.5 mmol/L (ref 3.5–5.1)
Sodium: 136 mmol/L (ref 135–145)

## 2020-05-08 LAB — D-DIMER, QUANTITATIVE: D-Dimer, Quant: 0.92 ug/mL-FEU — ABNORMAL HIGH (ref 0.00–0.50)

## 2020-05-08 LAB — GLUCOSE, CAPILLARY
Glucose-Capillary: 106 mg/dL — ABNORMAL HIGH (ref 70–99)
Glucose-Capillary: 110 mg/dL — ABNORMAL HIGH (ref 70–99)
Glucose-Capillary: 119 mg/dL — ABNORMAL HIGH (ref 70–99)
Glucose-Capillary: 162 mg/dL — ABNORMAL HIGH (ref 70–99)
Glucose-Capillary: 163 mg/dL — ABNORMAL HIGH (ref 70–99)
Glucose-Capillary: 81 mg/dL (ref 70–99)

## 2020-05-08 LAB — C-REACTIVE PROTEIN: CRP: 3.9 mg/dL — ABNORMAL HIGH (ref <1.0)

## 2020-05-08 LAB — MAGNESIUM: Magnesium: 1.7 mg/dL (ref 1.7–2.4)

## 2020-05-08 MED ORDER — MAGNESIUM SULFATE IN D5W 1-5 GM/100ML-% IV SOLN
1.0000 g | Freq: Once | INTRAVENOUS | Status: AC
  Administered 2020-05-08: 1 g via INTRAVENOUS
  Filled 2020-05-08: qty 100

## 2020-05-08 MED ORDER — POTASSIUM CHLORIDE 2 MEQ/ML IV SOLN
INTRAVENOUS | Status: DC
  Filled 2020-05-08 (×2): qty 1000

## 2020-05-08 MED ORDER — CARVEDILOL 12.5 MG PO TABS
12.5000 mg | ORAL_TABLET | Freq: Every day | ORAL | Status: DC
  Administered 2020-05-08 – 2020-05-10 (×3): 12.5 mg via ORAL
  Filled 2020-05-08 (×3): qty 1

## 2020-05-08 NOTE — Progress Notes (Signed)
Occupational Therapy Evaluation  Spoke with daughter [name] over the phone for PLOF and to discuss progress with OT. PTA, pt lived at home with his daughter [name] and had a PCA from the [place] 4 hrs/day 2x/wk to assist with ADL tasks. Pt uses an upright walker and lift chair for mobility and was able to ambulate to the bathroom @ modified independent level. During session, pt required min A to stand from elevated surface to simulate lift chair. Pt ambulated @ 10 ft in room and noted 2/4 DOE with SpO2 desat to 86 on RA with quick rebound to 90s. Recommend checking O2 sats using ear probe to help determine if home O2 needed. Recommend increased time OOB in chair and ambulate in room with nsg staff. Daughter states she plans to bring her Dad home and will have 24/7 assistance. Hopefully she will call the [place] and increase the number of hours he receives from his PCA service. Will follow acutely to facilitate safe DC home.    05/08/20 1700  OT Visit Information  Last OT Received On 05/08/20  Assistance Needed +1  History of Present Illness Pt is  an 86 y.o. male with a history of T2DM, HTN, HLD, CVA, AFib, stage III CKD, AAA and poorly healing wounds on recent course of amoxicillin who presented to the ED with 4-5 days of loose stools, cough, and progressively worsening generalized weakness. Pt admitted with sepsis due to COVID 19 and Cdiff.  Precautions  Precautions Fall  Home Living  Family/patient expects to be discharged to: Private residence  Living Arrangements Children  Available Help at Discharge Family;Available 24 hours/day  Type of Home House  Home Access Ramped entrance  Home Layout One level  Bathroom Shower/Tub Walk-in Pension scheme manager Yes  How Accessible Accessible via walker  Home Equipment Walker - 2 wheels;BSC;Shower seat;Cane - single point;Transport chair (upright walker)  Additional Comments lift chair  Prior Function  Level of Independence  Needs assistance  Gait / Transfers Assistance Needed able to ambulate from his lift chair to the bathroom and to his bedroom  ADL's / Homemaking Assistance Needed Has PCA 4 hrs/day 2 x/wk to help with bathing; daughter states she can increase hours if needed  Communication / Swallowing Assistance Needed HOH  Communication  Communication HOH (has @ right hearing aides in room)  Pain Assessment  Pain Assessment No/denies pain  Cognition  Arousal/Alertness Awake/alert  Behavior During Therapy WFL for tasks assessed/performed  Overall Cognitive Status Within Functional Limits for tasks assessed  General Comments appears to be at baseline  Difficult to assess due to Hard of hearing/deaf  Upper Extremity Assessment  Upper Extremity Assessment Generalized weakness  Lower Extremity Assessment  Lower Extremity Assessment Defer to PT evaluation  Cervical / Trunk Assessment  Cervical / Trunk Assessment Kyphotic  ADL  Overall ADL's  Needs assistance/impaired  Grooming Set up;Sitting  Upper Body Bathing Set up;Sitting  Lower Body Bathing Moderate assistance;Sit to/from stand  Upper Body Dressing  Set up;Sitting  Lower Body Dressing Moderate assistance;Sit to/from Scientist, research (life sciences) Minimal assistance;Ambulation  Toileting- Clothing Manipulation and Hygiene Minimal assistance;Sit to/from stand  Functional mobility during ADLs Minimal assistance;Rolling walker;Cueing for safety  Bed Mobility  Overal bed mobility Needs Assistance  Bed Mobility Supine to Sit  Supine to sit Min assist  General bed mobility comments increased time; using momentum and rails to get out of bed  Transfers  Overall transfer level Needs assistance  Equipment used  Rolling walker (2 wheeled)  Transfers Sit to/from Stand  Sit to Stand Min assist  General transfer comment Pt usually uses a lift chair  Balance  Overall balance assessment Needs assistance  Sitting balance-Leahy Scale Good  Standing balance-Leahy  Scale Poor  General Comments  General comments (skin integrity, edema, etc.) Used written communication due to [facility]  Exercises  Exercises Other exercises  Other Exercises  Other Exercises marching in chair x 20  Other Exercises BUE shoulder flexion x 20  Other Exercises Standing and marching x 1 min  OT - End of Session  Equipment Utilized During Treatment Gait belt;Rolling walker  Activity Tolerance Patient tolerated treatment well  Patient left in chair;with call bell/phone within reach;with chair alarm set  Nurse Communication Mobility status  OT Assessment  OT Recommendation/Assessment Patient needs continued OT Services  OT Visit Diagnosis Unsteadiness on feet (R26.81);Other abnormalities of gait and mobility (R26.89);Muscle weakness (generalized) (M62.81)  OT Problem List Decreased strength;Decreased activity tolerance;Impaired balance (sitting and/or standing);Decreased knowledge of use of DME or AE;Decreased safety awareness;Cardiopulmonary status limiting activity  OT Plan  OT Frequency (ACUTE ONLY) Min 2X/week  OT Treatment/Interventions (ACUTE ONLY) Self-care/ADL training;Therapeutic exercise;Energy conservation;DME and/or AE instruction;Therapeutic activities;Cognitive remediation/compensation;Patient/family education;Balance training  AM-PAC OT "6 Clicks" Daily Activity Outcome Measure (Version 2)  Help from another person eating meals? 4  Help from another person taking care of personal grooming? 3  Help from another person toileting, which includes using toliet, bedpan, or urinal? 2  Help from another person bathing (including washing, rinsing, drying)? 2  Help from another person to put on and taking off regular upper body clothing? 3  Help from another person to put on and taking off regular lower body clothing? 2  6 Click Score 16  OT Recommendation  Follow Up Recommendations Home health OT;Supervision/Assistance - 24 hour  OT Equipment None recommended by OT   Individuals Consulted  Consulted and Agree with Results and Recommendations Patient;Family member/caregiver  Family Member Consulted daughter Cecille Rubin  Acute Rehab OT Goals  Patient Stated Goal to go home  OT Goal Formulation With patient/family  Time For Goal Achievement 05/22/20  Potential to Achieve Goals Good  OT Time Calculation  OT Start Time (ACUTE ONLY) 1625  OT Stop Time (ACUTE ONLY) 1720  OT Time Calculation (min) 55 min  OT General Charges  $OT Visit 1 Visit  OT Evaluation  $OT Eval Moderate Complexity 1 Mod  OT Treatments  $Self Care/Home Management  23-37 mins  $Therapeutic Exercise 8-22 mins  Written Expression  Dominant Hand Right  Maurie Boettcher, OT/L   Acute OT Clinical Specialist [place] Pager [phone] Office [phone]

## 2020-05-08 NOTE — Progress Notes (Signed)
PROGRESS NOTE  Charles Richard  XIP:[id number] DOB: 03/16/1930 DOA: 05/05/2020 [facility]: [facility]  Outpatient Specialists: Urology, Dr. Mena Goes Brief Narrative: Charles Richard is an 86 y.o. male with a history of T2DM, HTN, HLD, CVA, AFib, stage III CKD, AAA and poorly healing wounds on recent course of amoxicillin who presented to the ED with 4-5 days of loose stools, cough, and progressively worsening generalized weakness. He's not been eating well, lives with his daughter and SIL for several years, both of whom have URI symptoms. In the ED he was febrile to 102.44F, tachycardic to 116bpm, tachypneic to 30/min without hypoxemia. SARS-CoV-2 PCR was positive and testing was positive for toxigenic C. diff. Remdesivir and oral vancomycin have been started.  Assessment & Plan: Active Problems:   Essential hypertension   Type 2 diabetes mellitus without complication (HCC)   AKI (acute kidney injury) (HCC)   Atrial fibrillation with RVR (HCC)   AAA (abdominal aortic aneurysm) without rupture (HCC)   COVID-19 virus infection   Pneumonia due to COVID-19 virus  Sepsis due to C. diff colitis and covid-19 pneumonia.  - Treat as below  Acute hypoxemic respiratory failure due to covid-19 pneumonia: SARS-CoV-2 PCR positive on 5/26.  - Continue supplemental oxygen when exerting himself, as documented by OT 5/29 - Continue remdesivir x5 days (5/27 [received 100mg  x2 separately] - 5/31). With C. diff, he's not a candidate to return for outpatient infusions and continues to have significant symptom burden. - Steroid given, though will hold with other active infection, diabetes, and no hypoxemia. CRP declining today.  - Vitamin C, zinc - Encourage OOB, IS, FV, and awake proning if becomes hypoxemic. - Tylenol and antitussives prn - Continue airborne, contact precautions. Isolation period is 21 days from positive testing. - Check CBC w/diff, CMP, CRP (mildly elevated at 6.6 at admission, improving)  daily - Confirmed patient is DNR on admission. This 86yo diabetic male remains at high risk for decompensation, as illness course is approaching 2nd week. - Typical visitation restrictions apply, will continue to attempt to maximize FaceTime with his daughter digitally as he's at high risk for delirium.  C. difficile colitis  - Vancomycin 125 mg po QID x 10 days. - Enteric precautions - Continue to hydrate, monitor fluid volume status and replace electrolytes as below. Continue LR w/potassium due to decrease from yesterday, ongoing GI losses and inadequate po intake. - Avoid PPIs and additional antibiotics.  Hypomagnesemia:  - Supplement again today and monitor in AM  AST elevation: Mild, due to covid. Improving. ALT remains wnl, no contraindication to remdesivir, etc.  - Monitor intermittently. Next 5/30  CAD: No chest pain, troponin not elevated.  - Continue coreg, eliquis, statin  Chronic HFpEF: Not overloaded.  - Continue home medication, ok to continue IV fluid for right now. Reassess daily.  T2DM: HbA1c 7.2%. - SSI, at inpatient goal    Paroxysmal atrial fibrillation with RVR: Initially on presentation reported RVR, since improved, now in NSR on 5/28. Follows with Dr. 6/28. - Continue eliquis, coreg  AKI on stage IIIb CKD: Putative staging based on available SCr.  - Continue IVF (hx LVEF 60-65%) due to ongoing diarrhea, will monitor closely to avoid pulmonary volume overload. SCr showing continued improvement. Plan to continue IVF but decrease rate w/increased po intake.   HTN:  - Holding lisinopril with AKI.  - Continue coreg, w/elevated HR and BP, will increase dose to 12.5mg  BID.  AAA:  - Continue surveillance as outpatient  Cellulitis, poorly healing  wounds to bilateral great toes: Good granulation tissue on both, nontender, no exudate. Has completed amoxicillin x14 days just PTA.  - Follow up with outpatient providers for further vascular studies and continue  wound care.  - Continue santyl, offloading  DVT prophylaxis: Eliquis Code Status: DNR Family Communication: Daughter by phone Disposition Plan:  Status is: Inpatient  Remains inpatient appropriate because:Ongoing diagnostic testing needed not appropriate for outpatient work up and IV treatments appropriate due to intensity of illness or inability to take PO. PT and OT evaluation requested, in speaking with his daughter I shared my fear that this 89yo M w/covid and CDiff who was walker-dependent at baseline may require SNF prior to returning home.   Dispo: The patient is from: Home              Anticipated d/c is to: Home w/home health and 24 hr supervision              Anticipated d/c date is: 5/31              Patient currently is not medically stable to d/c.  Consultants:   None  Procedures:   None  Antimicrobials:  Vancomycin, cefepime, flagyl 5/26  Remdesivir 5/27 - 5/31  Oral vancomycin 5/27 - 6/5  Subjective: Very HOH, so communication is difficult, but he's breathing better than he was, diarrhea is better, but still multiple times per day. Denies abdominal pain or leg swelling. Eating some but not normal amount.  Objective: Vitals:   05/07/20 1625 05/07/20 1952 05/08/20 0426 05/08/20 1139  BP: 134/77 (!) 148/93 (!) 144/88 (!) 141/88  Pulse: 89 96 96 87  Resp: (!) 23 19 16 19   Temp: 97.7 F (36.5 C) 98.6 F (37 C) 99.2 F (37.3 C) 98.6 F (37 C)  TempSrc: Oral Oral Oral Oral  SpO2: 97% 97% 97% 97%  Weight:      Height:        Intake/Output Summary (Last 24 hours) at 05/08/2020 1838 Last data filed at 05/08/2020 1805 Gross per 24 hour  Intake 837.46 ml  Output 175 ml  Net 662.46 ml   Filed Weights   05/05/20 2049  Weight: 97.5 kg   Gen: 86 y.o. male in no distress Pulm: Nonlabored breathing. Crackles stable. CV: Irreg irreg. No murmur, rub, or gallop. No JVD, no significant dependent edema. GI: Abdomen soft, non-tender, non-distended, with  normoactive bowel sounds.  Ext: Warm, no deformities Skin: No rashes, lesions or ulcers on visualized skin. Neuro: Alert and oriented. Very HOH. No focal neurological deficits. Psych: Judgement and insight appear fair. Mood euthymic & affect congruent. Behavior is appropriate.    Data Reviewed: I have personally reviewed following labs and imaging studies  CBC: Recent Labs  Lab 05/05/20 1924 05/07/20 0438 05/08/20 0432  WBC 4.6 5.3 5.7  NEUTROABS 3.2 3.3 3.5  HGB 13.0 11.7* 11.5*  HCT 38.7* 35.7* 35.4*  MCV 98.7 98.6 98.6  PLT 157 172 323   Basic Metabolic Panel: Recent Labs  Lab 05/05/20 1924 05/07/20 0438 05/08/20 0432  NA 136 139 136  K 4.1 3.5 3.5  CL 104 110 108  CO2 22 20* 19*  GLUCOSE 241* 128* 114*  BUN 30* 23 19  CREATININE 1.99* 1.64* 1.27*  CALCIUM 8.0* 7.6* 7.5*  MG  --  1.4* 1.7   GFR: Estimated Creatinine Clearance: 49.6 mL/min (A) (by C-G formula based on SCr of 1.27 mg/dL (H)). Liver Function Tests: Recent Labs  Lab 05/05/20 1924 05/07/20  0438  AST 56* 47*  ALT 36 33  ALKPHOS 33* 27*  BILITOT 0.3 0.6  PROT 6.4* 5.8*  ALBUMIN 3.0* 2.7*   Recent Labs  Lab 05/05/20 1953  LIPASE 40   No results for input(s): AMMONIA in the last 168 hours. Coagulation Profile: Recent Labs  Lab 05/05/20 1924  INR 1.1   Cardiac Enzymes: No results for input(s): CKTOTAL, CKMB, CKMBINDEX, TROPONINI in the last 168 hours. BNP (last 3 results) No results for input(s): PROBNP in the last 8760 hours. HbA1C: Recent Labs    05/05/20 1924  HGBA1C 7.2*   CBG: Recent Labs  Lab 05/08/20 0001 05/08/20 0424 05/08/20 0740 05/08/20 1114 05/08/20 1752  GLUCAP 81 106* 110* 162* 163*   Lipid Profile: No results for input(s): CHOL, HDL, LDLCALC, TRIG, CHOLHDL, LDLDIRECT in the last 72 hours. Thyroid Function Tests: No results for input(s): TSH, T4TOTAL, FREET4, T3FREE, THYROIDAB in the last 72 hours. Anemia Panel: Recent Labs    05/06/20 0040 05/07/20  0438  FERRITIN 440* 389*   Urine analysis:    Component Value Date/Time   COLORURINE YELLOW 05/06/2020 0040   APPEARANCEUR HAZY (A) 05/06/2020 0040   LABSPEC 1.018 05/06/2020 0040   PHURINE 5.0 05/06/2020 0040   GLUCOSEU NEGATIVE 05/06/2020 0040   HGBUR NEGATIVE 05/06/2020 0040   BILIRUBINUR NEGATIVE 05/06/2020 0040   KETONESUR 5 (A) 05/06/2020 0040   PROTEINUR 30 (A) 05/06/2020 0040   UROBILINOGEN 0.2 05/19/2009 0733   NITRITE NEGATIVE 05/06/2020 0040   LEUKOCYTESUR NEGATIVE 05/06/2020 0040   Recent Results (from the past 240 hour(s))  Culture, blood (Routine x 2)     Status: None (Preliminary result)   Collection Time: 05/05/20  7:24 PM   Specimen: BLOOD  Result Value Ref Range Status   Specimen Description   Final    BLOOD BLOOD RIGHT WRIST Performed at [facility], [address] [place], [place] [address]    Special Requests   Final    BOTTLES DRAWN AEROBIC AND ANAEROBIC Blood Culture adequate volume Performed at [facility], [address], [place], [place] [address]    Culture   Final    NO GROWTH 3 DAYS Performed at [facility], [address] [place], [place] [address]    Report Status PENDING  Incomplete  Culture, blood (Routine x 2)     Status: None (Preliminary result)   Collection Time: 05/05/20  7:24 PM   Specimen: BLOOD  Result Value Ref Range Status   Specimen Description   Final    BLOOD LEFT WRIST Performed at [facility], [address], [place], [place] [address]    Special Requests   Final    BOTTLES DRAWN AEROBIC AND ANAEROBIC Blood Culture adequate volume Performed at [facility], [address], [place], [place] [address]    Culture   Final    NO GROWTH 3 DAYS Performed at [facility], [address], [place], [place] [address]    Report Status PENDING  Incomplete  SARS Coronavirus 2 by RT PCR (hospital order, performed in [facility] hospital  lab) Nasopharyngeal Nasopharyngeal Swab     Status: Abnormal   Collection Time: 05/05/20  8:40 PM   Specimen: Nasopharyngeal Swab  Result Value Ref Range Status   SARS Coronavirus 2 POSITIVE (A) NEGATIVE Final    Comment: RESULT CALLED TO, READ BACK BY AND VERIFIED WITH: SMITH,J @ 2259 ON [id number] BY POTEAT,S (NOTE) SARS-CoV-2 target  nucleic acids are DETECTED SARS-CoV-2 RNA is generally detectable in upper respiratory specimens  during the acute phase of infection.  Positive results are indicative  of the presence of the identified virus, but do not rule out bacterial infection or co-infection with other pathogens not detected by the test.  Clinical correlation with patient history and  other diagnostic information is necessary to determine patient infection status.  The expected result is negative. Fact Sheet for Patients:   BoilerBrush.com.cy  Fact Sheet for Healthcare Providers:   https://pope.com/   This test is not yet approved or cleared by the [place] FDA and  has been authorized for detection and/or diagnosis of SARS-CoV-2 by FDA under an Emergency Use Authorization (EUA).  This EUA will remain in effect (meaning this test can  be used) for the duration of  the COVID-19 declaration under Section 564(b)(1) of the Act, 21 U.S.C. section 360-bbb-3(b)(1), unless the authorization is terminated or revoked sooner. Performed at [facility], [address], [place], [place] [address]   Urine culture     Status: None   Collection Time: 05/06/20 12:27 AM   Specimen: In/Out Cath Urine  Result Value Ref Range Status   Specimen Description   Final    IN/OUT CATH URINE Performed at [facility], [address] [place], [place] [address]    Special Requests   Final    NONE Performed at [facility], [address], [place], [place] [address]    Culture   Final    NO  GROWTH Performed at [facility], [address], [place], [place] [address]    Report Status 05/07/2020 FINAL  Final  C Difficile Quick Screen w PCR reflex     Status: Abnormal   Collection Time: 05/06/20 10:49 AM  Result Value Ref Range Status   C Diff antigen POSITIVE (A) NEGATIVE Final   C Diff toxin POSITIVE (A) NEGATIVE Final   C Diff interpretation Toxin producing C. difficile detected.  Final    Comment: CRITICAL RESULT CALLED TO, READ BACK BY AND VERIFIED WITH: WEST,S. RN @1513  05/06/20 BILLINGSLEY,L Performed at [facility], [address], [place], [place]       Radiology Studies: No results found.  Scheduled Meds: . apixaban  2.5 mg Oral BID  . carvedilol  12.5 mg Oral Q breakfast  . insulin aspart  0-9 Units Subcutaneous Q4H  . loratadine  10 mg Oral Q breakfast  . meclizine  25 mg Oral BID  . simvastatin  10 mg Oral Daily  . sodium chloride flush  3 mL Intravenous Q12H  . vancomycin  125 mg Oral QID   Continuous Infusions: . lactated ringers with kcl 75 mL/hr at 05/08/20 0905  . remdesivir 100 mg in NS 100 mL 100 mg (05/08/20 1109)     LOS: 2 days   Time spent: 35 minutes.  05/10/20, MD [facility] [email or web address] 05/08/2020, 6:38 PM

## 2020-05-08 NOTE — Evaluation (Signed)
Physical Therapy Evaluation Patient Details Name: Charles Richard MRN: [id number] DOB: 03-08-1930 Today's Date: 05/08/2020   History of Present Illness  Pt is  an 86 y.o. male with a history of T2DM, HTN, HLD, CVA, AFib, stage III CKD, AAA and poorly healing wounds on recent course of amoxicillin who presented to the ED with 4-5 days of loose stools, cough, and progressively worsening generalized weakness. Pt admitted with sepsis due to COVID 19 and Cdiff.  Clinical Impression  Pt admitted with above diagnosis. Pt requiring min-mod A during eval and with multiple LOB with walking.  He required visual, tactile, and gesture cues throughout session for safety and transfer technique as he is very HOH.  Pt with hx of falls and is high fall risk.  Reports he does not have 24 hr supervision. Recommend SNF at d/c for further rehab.  Pt currently with functional limitations due to the deficits listed below (see PT Problem List). Pt will benefit from skilled PT to increase their independence and safety with mobility to allow discharge to the venue listed below.       Follow Up Recommendations SNF;Supervision/Assistance - 24 hour    Equipment Recommendations  None recommended by PT(has DME per prior note)    Recommendations for Other Services       Precautions / Restrictions Precautions Precautions: Fall      Mobility  Bed Mobility Overal bed mobility: Needs Assistance             General bed mobility comments: in chair  Transfers Overall transfer level: Needs assistance Equipment used: Rolling walker (2 wheeled) Transfers: Sit to/from Stand Sit to Stand: Mod assist         General transfer comment: sit to stand from recliner and bsc; tactile cues to push up from chair  Ambulation/Gait Ambulation/Gait assistance: Min assist Gait Distance (Feet): 25 Feet(25' then 10') Assistive device: Rolling walker (2 wheeled) Gait Pattern/deviations: Wide base of support;Decreased stride  length;Shuffle Gait velocity: decreased   General Gait Details: Pt very unsteady requiring min A for balance.  Pt tending to push RW to far forward requiring assist to correct and assist to navigate in tight areas.  Stairs            Wheelchair Mobility    Modified Rankin (Stroke Patients Only)       Balance Overall balance assessment: Needs assistance Sitting-balance support: Bilateral upper extremity supported Sitting balance-Leahy Scale: Good     Standing balance support: Bilateral upper extremity supported;During functional activity Standing balance-Leahy Scale: Poor Standing balance comment: required min A for balance at times during ADLs (required assist for ADLs0                             Pertinent Vitals/Pain Pain Assessment: No/denies pain    Home Living Family/patient expects to be discharged to:: Skilled nursing facility Living Arrangements: Children(lives with daughter who works during day) Available Help at Discharge: Family;Available PRN/intermittently Type of Home: House Home Access: Ramped entrance     Home Layout: One level Home Equipment: Walker - 2 wheels;Bedside commode;Shower seat;Cane - single point;Transport chair      Prior Function           Comments: Pt unable to hear well enough with hx of HOH, face mask, and negative pressure room.  He reports he could walk some but with RW.  Pt unable to answer ADL questions due to [facility].  Per prior  note daughter was assisting with some ADLs and pt with hx of fall.     Hand Dominance        Extremity/Trunk Assessment   Upper Extremity Assessment Upper Extremity Assessment: Defer to OT evaluation    Lower Extremity Assessment Lower Extremity Assessment: Generalized weakness(difficult to further assess due to [facility]; demonstrates at least 3/5 but does demonstrate weakness with gait and transfers)    Cervical / Trunk Assessment Cervical / Trunk Assessment: Kyphotic  Communication    Communication: HOH  Cognition Arousal/Alertness: Awake/alert Behavior During Therapy: WFL for tasks assessed/performed Overall Cognitive Status: Difficult to assess                                 General Comments: due to [facility]; seems to be alert and oriented and answering appropriately when he hears      General Comments General comments (skin integrity, edema, etc.): ON RA with VSS.  Required frequent gestures and tactile cues for direction, technique, and safety.    Exercises     Assessment/Plan    PT Assessment Patient needs continued PT services  PT Problem List Decreased strength;Decreased mobility;Decreased safety awareness;Decreased range of motion;Decreased coordination;Decreased activity tolerance;Cardiopulmonary status limiting activity;Decreased balance;Decreased knowledge of use of DME       PT Treatment Interventions DME instruction;Therapeutic activities;Gait training;Therapeutic exercise;Patient/family education;Balance training;Functional mobility training    PT Goals (Current goals can be found in the Care Plan section)  Acute Rehab PT Goals Patient Stated Goal: return home PT Goal Formulation: With patient Time For Goal Achievement: 05/22/20 Potential to Achieve Goals: Good    Frequency Min 2X/week   Barriers to discharge Decreased caregiver support Does not have 24 hr support    Co-evaluation               AM-PAC PT "6 Clicks" Mobility  Outcome Measure Help needed turning from your back to your side while in a flat bed without using bedrails?: A Lot Help needed moving from lying on your back to sitting on the side of a flat bed without using bedrails?: A Lot Help needed moving to and from a bed to a chair (including a wheelchair)?: A Little Help needed standing up from a chair using your arms (e.g., wheelchair or bedside chair)?: A Lot Help needed to walk in hospital room?: A Little Help needed climbing 3-5 steps with a railing? :  A Lot 6 Click Score: 14    End of Session Equipment Utilized During Treatment: (needs gait belt in room) Activity Tolerance: Patient tolerated treatment well Patient left: with chair alarm set;in chair;with call bell/phone within reach Nurse Communication: Mobility status PT Visit Diagnosis: Unsteadiness on feet (R26.81);History of falling (Z91.81);Muscle weakness (generalized) (M62.81)    Time: 1410-1440 PT Time Calculation (min) (ACUTE ONLY): 30 min   Charges:   PT Evaluation $PT Eval Low Complexity: 1 Low PT Treatments $Therapeutic Activity: 8-22 mins        Royetta Asal, PT Acute Rehab Services Pager [phone] [facility] [phone] [facility] [phone]   Rayetta Humphrey 05/08/2020, 2:53 PM

## 2020-05-09 LAB — COMPREHENSIVE METABOLIC PANEL
ALT: 28 U/L (ref 0–44)
AST: 37 U/L (ref 15–41)
Albumin: 2.4 g/dL — ABNORMAL LOW (ref 3.5–5.0)
Alkaline Phosphatase: 30 U/L — ABNORMAL LOW (ref 38–126)
Anion gap: 9 (ref 5–15)
BUN: 20 mg/dL (ref 8–23)
CO2: 23 mmol/L (ref 22–32)
Calcium: 7.9 mg/dL — ABNORMAL LOW (ref 8.9–10.3)
Chloride: 107 mmol/L (ref 98–111)
Creatinine, Ser: 1.25 mg/dL — ABNORMAL HIGH (ref 0.61–1.24)
GFR calc Af Amer: 60 mL/min — ABNORMAL LOW (ref >60)
GFR calc non Af Amer: 51 mL/min — ABNORMAL LOW (ref >60)
Glucose, Bld: 116 mg/dL — ABNORMAL HIGH (ref 70–99)
Potassium: 3.9 mmol/L (ref 3.5–5.1)
Sodium: 139 mmol/L (ref 135–145)
Total Bilirubin: 1 mg/dL (ref 0.3–1.2)
Total Protein: 5.3 g/dL — ABNORMAL LOW (ref 6.5–8.1)

## 2020-05-09 LAB — CBC WITH DIFFERENTIAL/PLATELET
Abs Immature Granulocytes: 0.02 10*3/uL (ref 0.00–0.07)
Basophils Absolute: 0 10*3/uL (ref 0.0–0.1)
Basophils Relative: 0 %
Eosinophils Absolute: 0.1 10*3/uL (ref 0.0–0.5)
Eosinophils Relative: 1 %
HCT: 34.6 % — ABNORMAL LOW (ref 39.0–52.0)
Hemoglobin: 11.5 g/dL — ABNORMAL LOW (ref 13.0–17.0)
Immature Granulocytes: 0 %
Lymphocytes Relative: 27 %
Lymphs Abs: 1.5 10*3/uL (ref 0.7–4.0)
MCH: 32.1 pg (ref 26.0–34.0)
MCHC: 33.2 g/dL (ref 30.0–36.0)
MCV: 96.6 fL (ref 80.0–100.0)
Monocytes Absolute: 0.8 10*3/uL (ref 0.1–1.0)
Monocytes Relative: 14 %
Neutro Abs: 3.3 10*3/uL (ref 1.7–7.7)
Neutrophils Relative %: 58 %
Platelets: 213 10*3/uL (ref 150–400)
RBC: 3.58 MIL/uL — ABNORMAL LOW (ref 4.22–5.81)
RDW: 12.5 % (ref 11.5–15.5)
WBC: 5.8 10*3/uL (ref 4.0–10.5)
nRBC: 0 % (ref 0.0–0.2)

## 2020-05-09 LAB — C-REACTIVE PROTEIN: CRP: 4 mg/dL — ABNORMAL HIGH (ref <1.0)

## 2020-05-09 LAB — GLUCOSE, CAPILLARY
Glucose-Capillary: 100 mg/dL — ABNORMAL HIGH (ref 70–99)
Glucose-Capillary: 104 mg/dL — ABNORMAL HIGH (ref 70–99)
Glucose-Capillary: 106 mg/dL — ABNORMAL HIGH (ref 70–99)
Glucose-Capillary: 110 mg/dL — ABNORMAL HIGH (ref 70–99)
Glucose-Capillary: 115 mg/dL — ABNORMAL HIGH (ref 70–99)
Glucose-Capillary: 157 mg/dL — ABNORMAL HIGH (ref 70–99)
Glucose-Capillary: 163 mg/dL — ABNORMAL HIGH (ref 70–99)

## 2020-05-09 LAB — MAGNESIUM: Magnesium: 1.7 mg/dL (ref 1.7–2.4)

## 2020-05-09 LAB — D-DIMER, QUANTITATIVE: D-Dimer, Quant: 0.8 ug/mL-FEU — ABNORMAL HIGH (ref 0.00–0.50)

## 2020-05-09 MED ORDER — VANCOMYCIN HCL 125 MG PO CAPS: 125.0000 mg | ORAL_CAPSULE | Freq: Four times a day (QID) | ORAL | 0 refills | Status: AC

## 2020-05-09 NOTE — Progress Notes (Signed)
PROGRESS NOTE  Charles Richard  TML:[id number] DOB: [birth year] DOA: 05/05/2020 [facility]: [facility]  Outpatient Specialists: Urology, Dr. Mena Goes Brief Narrative: Charles Richard is an 86 y.o. male with a history of T2DM, HTN, HLD, CVA, AFib, stage III CKD, AAA and poorly healing wounds on recent course of amoxicillin who presented to the ED with 4-5 days of loose stools, cough, and progressively worsening generalized weakness. He's not been eating well, lives with his daughter and SIL for several years, both of whom have URI symptoms. In the ED he was febrile to 102.18F, tachycardic to 116bpm, tachypneic to 30/min without hypoxemia. SARS-CoV-2 PCR was positive and testing was positive for toxigenic C. diff. Remdesivir and oral vancomycin have been given with symptomatic improvement.  Assessment & Plan: Active Problems:   Essential hypertension   Type 2 diabetes mellitus without complication (HCC)   AKI (acute kidney injury) (HCC)   Atrial fibrillation with RVR (HCC)   AAA (abdominal aortic aneurysm) without rupture (HCC)   COVID-19 virus infection   Pneumonia due to COVID-19 virus  Sepsis due to C. diff colitis and covid-19 pneumonia. Sepsis resolved. - Treat as below  Acute hypoxemic respiratory failure due to covid-19 pneumonia: SARS-CoV-2 PCR positive on 5/26.  - Continue supplemental oxygen when exerting himself, as documented by OT 5/29. Repeat ambulatory pulse oximetry. - Continue remdesivir x5 days, will complete 5/31. LFTs have remained not elevated. - Steroid given, though will hold with other active infection, diabetes, and no hypoxemia. CRP declined - Vitamin C, zinc - Encourage OOB, IS, FV, and awake proning if becomes hypoxemic. - Tylenol and antitussives prn - Continue airborne, contact precautions. Isolation period is 21 days from positive testing. - Confirmed patient is DNR on admission. This 86yo diabetic male remains at high risk for decompensation, as illness  course is approaching 2nd week. - Typical visitation restrictions apply, maximize FaceTime with his daughter digitally as he's at high risk for delirium.  C. difficile colitis  - Vancomycin 125 mg po QID x 10 days. - Enteric precautions - Continue to hydrate, monitor fluid volume status and replace electrolytes as below. Continue LR w/potassium due to decrease from yesterday, ongoing GI losses and inadequate po intake. - Avoid PPIs and additional antibiotics. - DC IVF since no further diarrhea.  Hypomagnesemia:  - Supplement again today and monitor in AM  AST elevation: Mild, due to covid. Resolved.  CAD: No chest pain, troponin not elevated.  - Continue coreg, eliquis, statin  Chronic HFpEF: Not overloaded.  - Continue home medication  T2DM: HbA1c 7.2%. - SSI, at inpatient goal    Paroxysmal atrial fibrillation with RVR: Initially on presentation reported RVR, since improved. Follows with Dr. Herbie Baltimore. - Continue eliquis, coreg  AKI on stage IIIa CKD: SCr stabilized at ~1.2 indicating IIIa baseline.  - Resolved, stop IVF  HTN:  - Holding lisinopril with AKI.  - Continue coreg, w/elevated HR and BP, increased dose to 12.5mg  BID.  AAA:  - Continue surveillance as outpatient  Cellulitis, poorly healing wounds to bilateral great toes: Good granulation tissue on both, nontender, no exudate. Has completed amoxicillin x14 days just PTA.  - Follow up with outpatient providers for further vascular studies and continue wound care.  - Continue santyl, offloading  DVT prophylaxis: Eliquis Code Status: DNR Family Communication: Daughter by phone Disposition Plan:  Status is: Inpatient  Remains inpatient appropriate because:Ongoing diagnostic testing needed not appropriate for outpatient work up and IV treatments appropriate due to intensity of illness or  inability to take PO.   Dispo: The patient is from: Home              Anticipated d/c is to: Home w/home health and 24 hr  supervision              Anticipated d/c date is: 5/31. PAtient's pharmacy is the [place] (closed today and tomorrow), back up is [facility] on [address]. I called and verified they're open until 6pm, have 24 capsules of vancomycin in stock, and will be closed tomorrow. I have therefore, sent that prescription to that pharmacy and asked the patient's daughter to pick this up before 6pm today in preparation for discharge tomorrow.              Patient currently is not medically stable to d/c.  Consultants:   None  Procedures:   None  Antimicrobials:  Vancomycin, cefepime, flagyl 5/26  Remdesivir 5/27 - 5/31  Oral vancomycin 5/27 - 6/5  Subjective: No new complaints. Denies shortness of breath or chest or other pain. Diarrhea has stopped and he's eating better. He is winded moderately when ambulating which is not his baseline.  Objective: Vitals:   05/08/20 1139 05/08/20 2135 05/09/20 0443 05/09/20 1223  BP: (!) 141/88 135/83 (!) 150/91 133/77  Pulse: 87 83 99 87  Resp: 19 20 18 15   Temp: 98.6 F (37 C) 99.4 F (37.4 C) 98.7 F (37.1 C) 98.3 F (36.8 C)  TempSrc: Oral Oral Oral Oral  SpO2: 97% 97% 97% 98%  Weight:      Height:        Intake/Output Summary (Last 24 hours) at 05/09/2020 1646 Last data filed at 05/09/2020 1526 Gross per 24 hour  Intake 1000.63 ml  Output 975 ml  Net 25.63 ml   Filed Weights   05/05/20 2049  Weight: 97.5 kg   Gen: 86 y.o. male in no distress Pulm: Nonlabored but tachypneic, slight crackles laterally. CV: Irreg. No new murmur, rub, or gallop. No JVD, no pitting dependent edema. GI: Abdomen soft, non-tender, non-distended, with normoactive bowel sounds.  Ext: Warm, no deformities Skin: No rashes, lesions or ulcers on visualized skin. Neuro: Alert and oriented. Very HOH. No focal neurological deficits. Psych: Judgement and insight appear fair. Mood euthymic & affect congruent. Behavior is appropriate.    Data Reviewed: I have personally  reviewed following labs and imaging studies  CBC: Recent Labs  Lab 05/05/20 1924 05/07/20 0438 05/08/20 0432 05/09/20 0403  WBC 4.6 5.3 5.7 5.8  NEUTROABS 3.2 3.3 3.5 3.3  HGB 13.0 11.7* 11.5* 11.5*  HCT 38.7* 35.7* 35.4* 34.6*  MCV 98.7 98.6 98.6 96.6  PLT 157 172 179 341   Basic Metabolic Panel: Recent Labs  Lab 05/05/20 1924 05/07/20 0438 05/08/20 0432 05/09/20 0403  NA 136 139 136 139  K 4.1 3.5 3.5 3.9  CL 104 110 108 107  CO2 22 20* 19* 23  GLUCOSE 241* 128* 114* 116*  BUN 30* 23 19 20   CREATININE 1.99* 1.64* 1.27* 1.25*  CALCIUM 8.0* 7.6* 7.5* 7.9*  MG  --  1.4* 1.7 1.7   GFR: Estimated Creatinine Clearance: 50.4 mL/min (A) (by C-G formula based on SCr of 1.25 mg/dL (H)). Liver Function Tests: Recent Labs  Lab 05/05/20 1924 05/07/20 0438 05/09/20 0403  AST 56* 47* 37  ALT 36 33 28  ALKPHOS 33* 27* 30*  BILITOT 0.3 0.6 1.0  PROT 6.4* 5.8* 5.3*  ALBUMIN 3.0* 2.7* 2.4*   Recent Labs  Lab 05/05/20 1953  LIPASE 40   No results for input(s): AMMONIA in the last 168 hours. Coagulation Profile: Recent Labs  Lab 05/05/20 1924  INR 1.1   Cardiac Enzymes: No results for input(s): CKTOTAL, CKMB, CKMBINDEX, TROPONINI in the last 168 hours. BNP (last 3 results) No results for input(s): PROBNP in the last 8760 hours. HbA1C: No results for input(s): HGBA1C in the last 72 hours. CBG: Recent Labs  Lab 05/08/20 2044 05/09/20 0010 05/09/20 0438 05/09/20 0817 05/09/20 1221  GLUCAP 119* 115* 106* 104* 157*   Lipid Profile: No results for input(s): CHOL, HDL, LDLCALC, TRIG, CHOLHDL, LDLDIRECT in the last 72 hours. Thyroid Function Tests: No results for input(s): TSH, T4TOTAL, FREET4, T3FREE, THYROIDAB in the last 72 hours. Anemia Panel: Recent Labs    05/07/20 0438  FERRITIN 389*   Urine analysis:    Component Value Date/Time   COLORURINE YELLOW 05/06/2020 0040   APPEARANCEUR HAZY (A) 05/06/2020 0040   LABSPEC 1.018 05/06/2020 0040   PHURINE  5.0 05/06/2020 0040   GLUCOSEU NEGATIVE 05/06/2020 0040   HGBUR NEGATIVE 05/06/2020 0040   BILIRUBINUR NEGATIVE 05/06/2020 0040   KETONESUR 5 (A) 05/06/2020 0040   PROTEINUR 30 (A) 05/06/2020 0040   UROBILINOGEN 0.2 05/19/2009 0733   NITRITE NEGATIVE 05/06/2020 0040   LEUKOCYTESUR NEGATIVE 05/06/2020 0040   Recent Results (from the past 240 hour(s))  Culture, blood (Routine x 2)     Status: None (Preliminary result)   Collection Time: 05/05/20  7:24 PM   Specimen: BLOOD  Result Value Ref Range Status   Specimen Description   Final    BLOOD BLOOD RIGHT WRIST Performed at [facility], [address] [place], [place] [address]    Special Requests   Final    BOTTLES DRAWN AEROBIC AND ANAEROBIC Blood Culture adequate volume Performed at [facility], [address] [place], [place] [address]    Culture   Final    NO GROWTH 4 DAYS Performed at [facility], [address] [place], [place] [address]    Report Status PENDING  Incomplete  Culture, blood (Routine x 2)     Status: None (Preliminary result)   Collection Time: 05/05/20  7:24 PM   Specimen: BLOOD  Result Value Ref Range Status   Specimen Description   Final    BLOOD LEFT WRIST Performed at [facility], [address], [place], [place] [address]    Special Requests   Final    BOTTLES DRAWN AEROBIC AND ANAEROBIC Blood Culture adequate volume Performed at [facility], [address] [place], [place] [address]    Culture   Final    NO GROWTH 4 DAYS Performed at [facility], [address] [place], [place] [address]    Report Status PENDING  Incomplete  SARS Coronavirus 2 by RT PCR (hospital order, performed in [facility] hospital lab) Nasopharyngeal Nasopharyngeal Swab     Status: Abnormal   Collection Time: 05/05/20  8:40 PM   Specimen: Nasopharyngeal Swab  Result Value Ref Range Status   SARS Coronavirus 2 POSITIVE (A)  NEGATIVE Final    Comment: RESULT CALLED TO, READ BACK BY AND VERIFIED WITH: SMITH,J @ 2259 ON [id number] BY POTEAT,S (NOTE) SARS-CoV-2 target nucleic acids are DETECTED SARS-CoV-2 RNA is generally detectable in upper respiratory specimens  during the acute phase of infection.  Positive results are indicative  of the presence of the identified virus, but do  not rule out bacterial infection or co-infection with other pathogens not detected by the test.  Clinical correlation with patient history and  other diagnostic information is necessary to determine patient infection status.  The expected result is negative. Fact Sheet for Patients:   BoilerBrush.com.cy  Fact Sheet for Healthcare Providers:   https://pope.com/   This test is not yet approved or cleared by the [place] FDA and  has been authorized for detection and/or diagnosis of SARS-CoV-2 by FDA under an Emergency Use Authorization (EUA).  This EUA will remain in effect (meaning this test can  be used) for the duration of  the COVID-19 declaration under Section 564(b)(1) of the Act, 21 U.S.C. section 360-bbb-3(b)(1), unless the authorization is terminated or revoked sooner. Performed at [facility], [address], [place], [place] [address]   Urine culture     Status: None   Collection Time: 05/06/20 12:27 AM   Specimen: In/Out Cath Urine  Result Value Ref Range Status   Specimen Description   Final    IN/OUT CATH URINE Performed at [facility], [address], [place], [place] [address]    Special Requests   Final    NONE Performed at [facility], [address], [place], [place] [address]    Culture   Final    NO GROWTH Performed at [facility], [address] [place], [place] [address]    Report Status 05/07/2020 FINAL  Final  C Difficile Quick Screen w PCR reflex     Status: Abnormal   Collection  Time: 05/06/20 10:49 AM  Result Value Ref Range Status   C Diff antigen POSITIVE (A) NEGATIVE Final   C Diff toxin POSITIVE (A) NEGATIVE Final   C Diff interpretation Toxin producing C. difficile detected.  Final    Comment: CRITICAL RESULT CALLED TO, READ BACK BY AND VERIFIED WITH: WEST,S. RN @1513  05/06/20 BILLINGSLEY,L Performed at [facility], [address], [place], [place]       Radiology Studies: No results found.  Scheduled Meds: . apixaban  2.5 mg Oral BID  . carvedilol  12.5 mg Oral Q breakfast  . insulin aspart  0-9 Units Subcutaneous Q4H  . loratadine  10 mg Oral Q breakfast  . meclizine  25 mg Oral BID  . simvastatin  10 mg Oral Daily  . sodium chloride flush  3 mL Intravenous Q12H  . vancomycin  125 mg Oral QID   Continuous Infusions: . remdesivir 100 mg in NS 100 mL 100 mg (05/09/20 1055)     LOS: 3 days   Time spent: 25 minutes.  05/11/20, MD [facility] [email or web address] 05/09/2020, 4:46 PM

## 2020-05-10 LAB — CULTURE, BLOOD (ROUTINE X 2)
Culture: NO GROWTH
Culture: NO GROWTH
Special Requests: ADEQUATE
Special Requests: ADEQUATE

## 2020-05-10 LAB — GLUCOSE, CAPILLARY
Glucose-Capillary: 109 mg/dL — ABNORMAL HIGH (ref 70–99)
Glucose-Capillary: 121 mg/dL — ABNORMAL HIGH (ref 70–99)
Glucose-Capillary: 178 mg/dL — ABNORMAL HIGH (ref 70–99)

## 2020-05-10 NOTE — Progress Notes (Signed)
OT Cancellation Note  Patient Details Name: Charles Richard MRN: [id number] DOB: 15-Feb-1930   Cancelled Treatment:    Reason Eval/Treat Not Completed: Other (comment)(patient just received breakfast.) Will re-attempt as schedule permits.  Marlyce Huge OT Pager: [phone]   Carmelia Roller 05/10/2020, 11:33 AM

## 2020-05-10 NOTE — Discharge Summary (Signed)
Physician Discharge Summary  Charles Richard TOI:[id number] DOB: [birth year] DOA: 05/05/2020  PCP: [facility]  Admit date: 05/05/2020 Discharge date: 05/10/2020  Admitted From: Home Disposition: Home   Recommendations for Outpatient Follow-up:  1. Follow up with [facility]CP [facility]) in 1-2 weeks  Home Health: PT, OT, RN, aide, CSW Equipment/Devices: None new needed. Discharge Condition: Stable CODE STATUS: DNR Diet recommendation: Heart healthy  Brief/Interim Summary: Charles Richard is an 86 y.o. male with a history of T2DM, HTN, HLD, CVA, AFib, stage III CKD, AAA and poorly healing wounds on recent course of amoxicillin who presented to the ED with 4-5 days of loose stools, cough, and progressively worsening generalized weakness. He's not been eating well, lives with his daughter and SIL for several years, both of whom have URI symptoms. In the ED he was febrile to 102.53F, tachycardic to 116bpm, tachypneic to 30/min without hypoxemia. SARS-CoV-2 PCR was positive and testing was positive for toxigenic C. diff. Remdesivir and oral vancomycin have been given with symptomatic improvement.  Discharge Diagnoses:  Active Problems:   Essential hypertension   Type 2 diabetes mellitus without complication (HCC)   AKI (acute kidney injury) (HCC)   Atrial fibrillation with RVR (HCC)   AAA (abdominal aortic aneurysm) without rupture (HCC)   COVID-19 virus infection   Pneumonia due to COVID-19 virus  Sepsis due to C. diff colitis and covid-19 pneumonia. Sepsis resolved. - Treat as below  Acute hypoxemic respiratory failure due to covid-19 pneumonia: SARS-CoV-2 PCR positive on 5/26.  - Remdesivir x5 days completed on day of discharge 5/31. LFTs have remained not elevated. - Steroid given, though will hold with other active infection, diabetes, and no hypoxemia. CRP declined - Isolation period is 21 days from positive testing.  C. difficile colitis: Diarrhea resolved, abdomen  remains benign. - Vancomycin 125 mg po QID x 10 days. - Enteric precautions - Avoid PPIs and additional antibiotics if feasible.  Hypomagnesemia:  - Supplemented with improvement.  AST elevation: Mild, due to covid. Resolved.  CAD: No chest pain, troponin not elevated.  - Continue coreg, eliquis, statin  Chronic HFpEF: Not overloaded.  - Continue home med   T2DM: HbA1c 7.2%. - Continue home med  Paroxysmal atrial fibrillation with RVR: Initially on presentation reported RVR, since improved. Follows with Dr. Herbie Baltimore. - Continue eliquis, coreg  AKI on stage IIIa CKD: SCr stabilized at ~1.2 indicating IIIa baseline.  - Resolved  HTN:  - Can restart home medications as PTA  AAA:  - Continue surveillance as outpatient  Cellulitis, poorly healing wounds to bilateral great toes: Good granulation tissue on both, nontender, no exudate. Has completed amoxicillin x14 days just PTA.  - Follow up with outpatient providers for further vascular studies and continue wound care.  - Continue santyl, offloading  Discharge Instructions  Allergies as of 05/10/2020   No Known Allergies     Medication List    STOP taking these medications   oxyCODONE 5 MG immediate release tablet Commonly known as: Oxy IR/ROXICODONE     TAKE these medications   apixaban 2.5 MG Tabs tablet Commonly known as: ELIQUIS Take 1 tablet (2.5 mg total) by mouth 2 (two) times daily.   carvedilol 6.25 MG tablet Commonly known as: COREG Take 6.25 mg by mouth daily with breakfast.   cholecalciferol 1000 units tablet Commonly known as: VITAMIN D Take 1,000 Units by mouth at bedtime.   docusate sodium 100 MG capsule Commonly known as: COLACE Take 200 mg by mouth  daily.   lisinopril 40 MG tablet Commonly known as: ZESTRIL Take 0.5 tablets (20 mg total) by mouth daily with breakfast. HOLD UNTIL FOLLOW UP WITH PCP What changed: additional instructions   loratadine 10 MG tablet Commonly known  as: CLARITIN Take 10 mg by mouth daily with breakfast.   meclizine 25 MG tablet Commonly known as: ANTIVERT Take 25 mg by mouth 2 (two) times daily.   metFORMIN 1000 MG tablet Commonly known as: GLUCOPHAGE Take 1,000 mg by mouth 2 (two) times daily with a meal.   multivitamin with minerals Tabs tablet Take 1 tablet by mouth at bedtime.   simvastatin 10 MG tablet Commonly known as: ZOCOR Take 10 mg by mouth daily.   Systane Balance 0.6 % Soln Generic drug: Propylene Glycol Place 1-2 drops into both eyes daily as needed (for dry eyes).   traMADol 50 MG tablet Commonly known as: ULTRAM Take 100 mg by mouth every 6 (six) hours as needed for moderate pain.   vancomycin 125 MG capsule Commonly known as: Vancocin HCl Take 1 capsule (125 mg total) by mouth 4 (four) times daily for 6 days.      Follow-up Information    [facility] Follow up.   Specialty: Home Health Services Why: agency will provide home health physical therapy, occupational therapy, nurse, aide, social work. Contact information: [address] [place] [address] [phone]          No Known Allergies  Consultations:  None  Procedures/Studies: DG Chest 2 View  Result Date: 05/05/2020 CLINICAL DATA:  Fever, weakness for 4 days EXAM: CHEST - 2 VIEW COMPARISON:  05/09/2017 FINDINGS: Single frontal view of the chest demonstrates an unremarkable cardiac silhouette. Atherosclerosis of the aortic arch. Patchy consolidation at the left lung base could reflect atelectasis or airspace disease. Evaluation limited on this portable exam. No large effusion or pneumothorax. No acute bony abnormalities. IMPRESSION: 1. Patchy consolidation at the left lung base which may reflect atelectasis or airspace disease. PA and lateral views of the chest may be useful when clinical situation permits. Electronically Signed   By: Sharlet Salina M.D.   On: 05/05/2020 20:13   Subjective: Feels well, wants to go  home. No further diarrhea, eating normally. Denies any shortness of breath, chest pain, or palpitations.   Discharge Exam: Vitals:   05/09/20 1958 05/10/20 0542  BP: (!) 142/92 (!) 144/94  Pulse: 78 83  Resp: 20 18  Temp: 99.1 F (37.3 C) 99.5 F (37.5 C)  SpO2: 98% 96%   General: Pt is alert, awake, not in acute distress Cardiovascular: RRR, S1/S2 +, no rubs, no gallops Respiratory: CTA bilaterally, no wheezing, no rhonchi Abdominal: Soft, NT, ND, bowel sounds + Extremities: No edema, no cyanosis  Labs: BNP (last 3 results) No results for input(s): BNP in the last 8760 hours. Basic Metabolic Panel: Recent Labs  Lab 05/05/20 1924 05/07/20 0438 05/08/20 0432 05/09/20 0403  NA 136 139 136 139  K 4.1 3.5 3.5 3.9  CL 104 110 108 107  CO2 22 20* 19* 23  GLUCOSE 241* 128* 114* 116*  BUN 30* 23 19 20   CREATININE 1.99* 1.64* 1.27* 1.25*  CALCIUM 8.0* 7.6* 7.5* 7.9*  MG  --  1.4* 1.7 1.7   Liver Function Tests: Recent Labs  Lab 05/05/20 1924 05/07/20 0438 05/09/20 0403  AST 56* 47* 37  ALT 36 33 28  ALKPHOS 33* 27* 30*  BILITOT 0.3 0.6 1.0  PROT 6.4* 5.8* 5.3*  ALBUMIN 3.0* 2.7* 2.4*   Recent Labs  Lab 05/05/20 1953  LIPASE 40   No results for input(s): AMMONIA in the last 168 hours. CBC: Recent Labs  Lab 05/05/20 1924 05/07/20 0438 05/08/20 0432 05/09/20 0403  WBC 4.6 5.3 5.7 5.8  NEUTROABS 3.2 3.3 3.5 3.3  HGB 13.0 11.7* 11.5* 11.5*  HCT 38.7* 35.7* 35.4* 34.6*  MCV 98.7 98.6 98.6 96.6  PLT 157 172 179 213   Cardiac Enzymes: No results for input(s): CKTOTAL, CKMB, CKMBINDEX, TROPONINI in the last 168 hours. BNP: Invalid input(s): POCBNP CBG: Recent Labs  Lab 05/09/20 1701 05/09/20 1959 05/09/20 2345 05/10/20 0539 05/10/20 0825  GLUCAP 110* 163* 100* 109* 121*   D-Dimer Recent Labs    05/08/20 0432 05/09/20 0403  DDIMER 0.92* 0.80*   Hgb A1c No results for input(s): HGBA1C in the last 72 hours. Lipid Profile No results for  input(s): CHOL, HDL, LDLCALC, TRIG, CHOLHDL, LDLDIRECT in the last 72 hours. Thyroid function studies No results for input(s): TSH, T4TOTAL, T3FREE, THYROIDAB in the last 72 hours.  Invalid input(s): FREET3 Anemia work up No results for input(s): VITAMINB12, FOLATE, FERRITIN, TIBC, IRON, RETICCTPCT in the last 72 hours. Urinalysis    Component Value Date/Time   COLORURINE YELLOW 05/06/2020 0040   APPEARANCEUR HAZY (A) 05/06/2020 0040   LABSPEC 1.018 05/06/2020 0040   PHURINE 5.0 05/06/2020 0040   GLUCOSEU NEGATIVE 05/06/2020 0040   HGBUR NEGATIVE 05/06/2020 0040   BILIRUBINUR NEGATIVE 05/06/2020 0040   KETONESUR 5 (A) 05/06/2020 0040   PROTEINUR 30 (A) 05/06/2020 0040   UROBILINOGEN 0.2 05/19/2009 0733   NITRITE NEGATIVE 05/06/2020 0040   LEUKOCYTESUR NEGATIVE 05/06/2020 0040    Microbiology Recent Results (from the past 240 hour(s))  Culture, blood (Routine x 2)     Status: None   Collection Time: 05/05/20  7:24 PM   Specimen: BLOOD  Result Value Ref Range Status   Specimen Description   Final    BLOOD BLOOD RIGHT WRIST Performed at [facility], [place] [address], [place], [place] [address]    Special Requests   Final    BOTTLES DRAWN AEROBIC AND ANAEROBIC Blood Culture adequate volume Performed at [place] [address], [place], [place] [address]    Culture   Final    NO GROWTH 5 DAYS Performed at [facility], [place] [address] [place], [place] [address]    Report Status 05/10/2020 FINAL  Final  Culture, blood (Routine x 2)     Status: None   Collection Time: 05/05/20  7:24 PM   Specimen: BLOOD  Result Value Ref Range Status   Specimen Description   Final    BLOOD LEFT WRIST Performed at [place] [address] [place], [place] [address]    Special Requests   Final    BOTTLES DRAWN AEROBIC AND ANAEROBIC Blood Culture adequate volume Performed at [place]  [address] [place], [place] [address]    Culture   Final    NO GROWTH 5 DAYS Performed at [facility], [place] [address], [place],  [address]    Report Status 05/10/2020 FINAL  Final  SARS Coronavirus 2 by RT PCR (hospital order, performed in [facility] hospital lab) Nasopharyngeal Nasopharyngeal Swab     Status: Abnormal   Collection Time: 05/05/20  8:40 PM   Specimen: Nasopharyngeal Swab  Result Value Ref Range Status   SARS Coronavirus 2 POSITIVE (A)  NEGATIVE Final    Comment: RESULT CALLED TO, READ BACK BY AND VERIFIED WITH: SMITH,J @ 2259 ON [id number] BY POTEAT,S (NOTE) SARS-CoV-2 target nucleic acids are DETECTED SARS-CoV-2 RNA is generally detectable in upper respiratory specimens  during the acute phase of infection.  Positive results are indicative  of the presence of the identified virus, but do not rule out bacterial infection or co-infection with other pathogens not detected by the test.  Clinical correlation with patient history and  other diagnostic information is necessary to determine patient infection status.  The expected result is negative. Fact Sheet for Patients:   BoilerBrush.com.cy  Fact Sheet for Healthcare Providers:   https://pope.com/   This test is not yet approved or cleared by the [place] FDA and  has been authorized for detection and/or diagnosis of SARS-CoV-2 by FDA under an Emergency Use Authorization (EUA).  This EUA will remain in effect (meaning this test can  be used) for the duration of  the COVID-19 declaration under Section 564(b)(1) of the Act, 21 U.S.C. section 360-bbb-3(b)(1), unless the authorization is terminated or revoked sooner. Performed at [facility], [address], [place], [place] [address]   Urine culture     Status: None   Collection Time: 05/06/20 12:27 AM   Specimen: In/Out Cath Urine  Result Value Ref Range Status   Specimen Description    Final    IN/OUT CATH URINE Performed at [facility], [address], [place], [place] [address]    Special Requests   Final    NONE Performed at [facility], [address] [place], [place] [address]    Culture   Final    NO GROWTH Performed at [facility], [address] [place], [place] [address]    Report Status 05/07/2020 FINAL  Final  C Difficile Quick Screen w PCR reflex     Status: Abnormal   Collection Time: 05/06/20 10:49 AM  Result Value Ref Range Status   C Diff antigen POSITIVE (A) NEGATIVE Final   C Diff toxin POSITIVE (A) NEGATIVE Final   C Diff interpretation Toxin producing C. difficile detected.  Final    Comment: CRITICAL RESULT CALLED TO, READ BACK BY AND VERIFIED WITH: WEST,S. RN @1513  05/06/20 BILLINGSLEY,L Performed at [facility], [address], [place], [place]     Time coordinating discharge: Approximately 40 minutes  [id number], MD  [facility] 05/10/2020, 10:27 AM

## 2020-05-10 NOTE — TOC Initial Note (Signed)
Transition of Care [facility]) - Initial/Assessment Note    Patient Details  Name: Charles Richard MRN: [id number] Date of Birth: August 17, 1930  [facility] CM/SW Contact:    Armanda Heritage, RN Phone Number: 05/10/2020, 9:33 AM  Clinical Narrative:                 CM spoke with patient's daughter who reports her dad has lived with her for the last four years.  Daughter reports she has all necessary equipment at home.  [facility] to provide HHPT/OT/RN/AIDE/ social work.    Expected Discharge Plan: Home w Home Health Services Barriers to Discharge: No Barriers Identified   Patient Goals and CMS Choice Patient states their goals for this hospitalization and ongoing recovery are:: to go home today CMS Medicare.gov Compare Post Acute Care list provided to:: Patient Represenative (must comment) Choice offered to / list presented to : Adult Children  Expected Discharge Plan and Services Expected Discharge Plan: Home w Home Health Services   Discharge Planning Services: CM Consult Post Acute Care Choice: Home Health Living arrangements for the past 2 months: Single Family Home Expected Discharge Date: 05/10/20               DME Arranged: N/A DME Agency: NA       HH Arranged: PT, OT, RN, Nurse's Aide, Social Work [employer] Agency: [facility] Date HH Agency Contacted: 05/10/20 Time HH Agency Contacted: [phone] Representative spoke with at [facility] Agency: Amy  Prior Living Arrangements/Services Living arrangements for the past 2 months: Single Family Home Lives with:: Adult Children Patient language and need for interpreter reviewed:: Yes Do you feel safe going back to the place where you live?: Yes      Need for Family Participation in Patient Care: Yes (Comment) Care giver support system in place?: Yes (comment)   Criminal Activity/Legal Involvement Pertinent to Current Situation/Hospitalization: No - Comment as needed  Activities of Daily Living Home Assistive  Devices/Equipment: Other (Comment), Shower chair with back, Bedside commode/3-in-1, Hand-held shower hose, Cane (specify quad or straight), CBG Meter(ramp entrance, toilet riser, t[place] chair, lift chair, lift bed) ADL Screening (condition at time of admission) Patient's cognitive ability adequate to safely complete daily activities?: Yes Is the patient deaf or have difficulty hearing?: Yes Does the patient have difficulty seeing, even when wearing glasses/contacts?: No Does the patient have difficulty concentrating, remembering, or making decisions?: No Patient able to express need for assistance with ADLs?: Yes Does the patient have difficulty dressing or bathing?: Yes Independently performs ADLs?: No Communication: Independent Dressing (OT): Needs assistance Is this a change from baseline?: Change from baseline, expected to last >3 days Grooming: Needs assistance Is this a change from baseline?: Change from baseline, expected to last >3 days Feeding: Needs assistance Is this a change from baseline?: Change from baseline, expected to last >3 days Bathing: Needs assistance Is this a change from baseline?: Change from baseline, expected to last >3 days Toileting: Needs assistance Is this a change from baseline?: Change from baseline, expected to last >3days In/Out Bed: Needs assistance Is this a change from baseline?: Change from baseline, expected to last >3 days Walks in Home: Needs assistance Is this a change from baseline?: Change from baseline, expected to last >3 days Does the patient have difficulty walking or climbing stairs?: Yes Weakness of Legs: Both Weakness of Arms/Hands: None  Permission Sought/Granted  Emotional Assessment Appearance:: Appears stated age         Psych Involvement: No (comment)  Admission diagnosis:  Recurrent pneumonia [J18.9] Sepsis, due to unspecified organism, unspecified whether acute organ dysfunction present ([place])  [A41.9] COVID-19 virus infection [U07.1] COVID-19 [U07.1] Patient Active Problem List   Diagnosis Date Noted  . Pneumonia due to COVID-19 virus 05/06/2020  . COVID-19 virus infection 05/05/2020  . Closed fracture of right proximal humerus 07/20/2017  . S/P reverse total shoulder arthroplasty, 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  . AAA (abdominal aortic aneurysm) without rupture ([place]) 06/03/2017  . Preop cardiovascular exam 06/01/2017  . Physical deconditioning   . Ureteral stone with hydronephrosis 05/09/2017  . Essential hypertension 05/09/2017  . Type 2 diabetes mellitus without complication ([place]) 00/71/2197  . Hyponatremia 05/09/2017  . AKI (acute kidney injury) ([place]) 05/09/2017  . Atrial fibrillation with RVR ([place]) 05/09/2017  . Sepsis secondary to UTI ([place]) 05/09/2017   PCP:  [facility], [place]:   [place], [place] - [address] [place] [address] Phone: [phone] Fax: [place] [id number] [place], [place] - [place] [address] [place] [address] Phone: [phone] Fax: [phone]     Social Determinants of Health (SDOH) Interventions    Readmission Risk Interventions No flowsheet data found.

## 2020-08-29 ENCOUNTER — Other Ambulatory Visit: Payer: Self-pay

## 2020-08-29 ENCOUNTER — Ambulatory Visit
Admission: EM | Admit: 2020-08-29 | Discharge: 2020-08-29 | Disposition: A | Discharge status: Home / Self Care | Payer: No Typology Code available for payment source | Attending: Physician Assistant | Admitting: Physician Assistant

## 2020-08-29 DIAGNOSIS — Z5189 Encounter for other specified aftercare: Secondary | ICD-10-CM | POA: Diagnosis not present

## 2020-08-29 DIAGNOSIS — L03031 Cellulitis of right toe: Secondary | ICD-10-CM

## 2020-08-29 DIAGNOSIS — L03032 Cellulitis of left toe: Secondary | ICD-10-CM

## 2020-08-29 MED ORDER — CEPHALEXIN 500 MG PO CAPS
500.0000 mg | ORAL_CAPSULE | Freq: Four times a day (QID) | ORAL | 0 refills | Status: DC
Start: 2020-08-29 — End: 2021-02-20

## 2020-08-29 NOTE — ED Provider Notes (Signed)
[facility]    CSN: [id number] Arrival date & time: 08/29/20  1322      History   Chief Complaint Chief Complaint  Patient presents with  . Toe problem    both great toes    HPI Charles Richard is a 86 y.o. male.   86 year old male with history as below comes in with daughter for increased redness and pain from chronic wound. States being followed at the [place], and has ruled out osteomyelitis and vascular causes of symptoms. Thought to be continued diabetic ulcer. Area being dressed by home health and monitored by PCP. 2 days ago, noticed area more red than normal with swelling and increased pain. No obvious spreading erythema. No fever. No active drainage.      Past Medical History:  Diagnosis Date  . AAA (abdominal aortic aneurysm) without rupture (HCC) 06/03/2017  . Arthritis   . Atrial fibrillation with rapid ventricular response (HCC) 04/2017   New diagnosis during admission for urosepsis secondary to left nephrolithiasis/ureteral stone with hydronephrosis  . Diabetes mellitus without complication (HCC)   . Dysrhythmia    a fib  . Essential hypertension   . Hard of hearing   . History of CVA (cerebrovascular accident) without residual deficits 05/2009   Admitted with expressive aphasia & confusion following a neurocardiogenic syncope event (related to dehydration, AKD & BB related hypotension/bradycardia)  . History of kidney stones   . Hyperlipidemia   . Left nephrolithiasis 04/2017   s/p L Ureteral stent for L hydronephrosis & UTI/Urosepsis.    . Myocardial infarction (HCC)   . Stroke [facility])    mild stroke no residuals    Patient Active Problem List   Diagnosis Date Noted  . Pneumonia due to COVID-19 virus 05/06/2020  . COVID-19 virus infection 05/05/2020  . Closed fracture of right proximal humerus 07/20/2017  . S/P reverse total shoulder arthroplasty, 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  . AAA (abdominal aortic aneurysm) without rupture (HCC) 06/03/2017  . Preop  cardiovascular exam 06/01/2017  . Physical deconditioning   . Ureteral stone with hydronephrosis 05/09/2017  . Essential hypertension 05/09/2017  . Type 2 diabetes mellitus without complication (HCC) 05/09/2017  . Hyponatremia 05/09/2017  . AKI (acute kidney injury) (HCC) 05/09/2017  . Atrial fibrillation with RVR (HCC) 05/09/2017  . Sepsis secondary to UTI (HCC) 05/09/2017    Past Surgical History:  Procedure Laterality Date  . CHOLECYSTECTOMY    . CYSTOSCOPY W/ URETERAL STENT PLACEMENT Left 05/09/2017   Procedure: CYSTOSCOPY WITH RETROGRADE PYELOGRAM/URETERAL STENT PLACEMENT;  Surgeon: Jerilee Field, MD;  Location: [facility];  Service: Urology;  Laterality: Left;  . CYSTOSCOPY WITH RETROGRADE PYELOGRAM, URETEROSCOPY AND STENT PLACEMENT Left 06/08/2017   Procedure: CYSTOSCOPY WITH LEFT RETROGRADE PYELOGRAM, URETEROSCOPY HOLMIUM LASER LITHOTRIPSY BASKET EXTRACTION  AND STENT EXCHANGE;  Surgeon: Jerilee Field, MD;  Location: [facility];  Service: Urology;  Laterality: Left;  . EYE SURGERY     right  . HOLMIUM LASER APPLICATION Left 06/08/2017   Procedure: HOLMIUM LASER APPLICATION;  Surgeon: Jerilee Field, MD;  Location: [facility];  Service: Urology;  Laterality: Left;  . LITHOTRIPSY    . REVERSE SHOULDER ARTHROPLASTY Right 07/20/2017   Procedure: REVERSE RIGHT SHOULDER ARTHROPLASTY;  Surgeon: Yolonda Kida, MD;  Location: [facility] OR;  Service: Orthopedics;  Laterality: Right;  150 mins       Home Medications    Prior to Admission medications   Medication Sig Start Date End Date Taking? Authorizing Provider  apixaban (ELIQUIS) 2.5 MG  TABS tablet Take 1 tablet (2.5 mg total) by mouth 2 (two) times daily. 05/11/17   Vassie Loll, MD  carvedilol (COREG) 6.25 MG tablet Take 6.25 mg by mouth daily with breakfast.    [provider]  cephALEXin (KEFLEX) 500 MG capsule Take 1 capsule (500 mg total) by mouth 4 (four) times daily. 08/29/20   Cathie Hoops, Andrey Mccaskill V, PA-C  cholecalciferol  (VITAMIN D) 1000 units tablet Take 1,000 Units by mouth at bedtime.    [provider]  docusate sodium (COLACE) 100 MG capsule Take 200 mg by mouth daily.    [provider]  lisinopril (PRINIVIL,ZESTRIL) 40 MG tablet Take 0.5 tablets (20 mg total) by mouth daily with breakfast. HOLD UNTIL FOLLOW UP WITH PCP Patient taking differently: Take 20 mg by mouth daily with breakfast.  05/11/17   Vassie Loll, MD  loratadine (CLARITIN) 10 MG tablet Take 10 mg by mouth daily with breakfast.     [provider]  meclizine (ANTIVERT) 25 MG tablet Take 25 mg by mouth 2 (two) times daily.     [provider]  metFORMIN (GLUCOPHAGE) 1000 MG tablet Take 1,000 mg by mouth 2 (two) times daily with a meal.    [provider]  Multiple Vitamin (MULTIVITAMIN WITH MINERALS) TABS tablet Take 1 tablet by mouth at bedtime.    [provider]  Propylene Glycol (SYSTANE BALANCE) 0.6 % SOLN Place 1-2 drops into both eyes daily as needed (for dry eyes).    [provider]  simvastatin (ZOCOR) 10 MG tablet Take 10 mg by mouth daily.    [provider]  traMADol (ULTRAM) 50 MG tablet Take 100 mg by mouth every 6 (six) hours as needed for moderate pain.    [provider]    Family History Family History  Problem Relation Age of Onset  . Lung cancer Father        black lung  . CAD Neg Hx   . Kidney Stones Neg Hx   . Aneurysm Neg Hx     Social History Social History   Tobacco Use  . Smoking status: Never Smoker  . Smokeless tobacco: Never Used  Vaping Use  . Vaping Use: Never used  Substance Use Topics  . Alcohol use: No  . Drug use: No     Allergies   Patient has no known allergies.   Review of Systems Review of Systems  Reason unable to perform ROS: See HPI as above.     Physical Exam Triage Vital Signs ED Triage Vitals  Enc Vitals Group     BP 08/29/20 1410 128/63     Pulse Rate 08/29/20 1410 (!) 101     Resp  08/29/20 1410 14     Temp 08/29/20 1410 98.2 F (36.8 C)     Temp Source 08/29/20 1410 Oral     SpO2 08/29/20 1410 98 %     Weight --      Height --      Head Circumference --      Peak Flow --      Pain Score 08/29/20 1440 0     Pain Loc --      Pain Edu? --      Excl. in GC? --    No data found.  Updated Vital Signs BP 128/63 (BP Location: Right Arm)   Pulse (!) 101   Temp 98.2 F (36.8 C) (Oral)   Resp 14   SpO2 98%  Physical Exam Constitutional:      General: He is not in acute distress.    Appearance: Normal appearance. He is well-developed. He is not toxic-appearing or diaphoretic.  HENT:     Head: Normocephalic and atraumatic.  Eyes:     Conjunctiva/sclera: Conjunctivae normal.     Pupils: Pupils are equal, round, and reactive to light.  Pulmonary:     Effort: Pulmonary effort is normal. No respiratory distress.  Musculoskeletal:     Cervical back: Normal range of motion and neck supple.     Comments: See picture below. Tender to palpation.  Mild serosanguineous drainage at palpation, no obvious purulent drainage. NVI  Skin:    General: Skin is warm and dry.  Neurological:     Mental Status: He is alert and oriented to person, place, and time.          UC Treatments / Results  Labs (all labs ordered are listed, but only abnormal results are displayed) Labs Reviewed - No data to display  EKG   Radiology No results found.  Procedures Procedures (including critical care time)  Medications Ordered in UC Medications - No data to display  Initial Impression / Assessment and Plan / UC Course  I have reviewed the triage vital signs and the nursing notes.  Pertinent labs & imaging results that were available during my care of the patient were reviewed by me and considered in my medical decision making (see chart for details).    Will start Keflex as directed.  Wound care instructions given.  Otherwise patient to follow-up with PCP for further  evaluation and management needed.  Final Clinical Impressions(s) / UC Diagnoses   Final diagnoses:  Visit for wound check  Cellulitis of toe of left foot  Cellulitis of toe of right foot   ED Prescriptions    Medication Sig Dispense Auth. Provider   cephALEXin (KEFLEX) 500 MG capsule Take 1 capsule (500 mg total) by mouth 4 (four) times daily. 28 capsule Belinda Fisher, PA-C     PDMP not reviewed this encounter.   Belinda Fisher, PA-C 08/29/20 [phone]

## 2020-08-29 NOTE — ED Triage Notes (Signed)
Patient is here for evaluation of ulcerations on both great toes-- increased redness and warmth.

## 2020-08-29 NOTE — Discharge Instructions (Signed)
Start keflex as directed. Keep wound clean and dry. You can clean gently with soap and water. Monitor for spreading redness, increased warmth, increased swelling, fever, go to the emergency department for further evaluation. Otherwise, follow up with PCP this week for recheck.

## 2021-01-11 ENCOUNTER — Other Ambulatory Visit: Payer: Self-pay | Admitting: Urology

## 2021-01-21 ENCOUNTER — Encounter (HOSPITAL_COMMUNITY): Admission: RE | Payer: Self-pay | Source: Home / Self Care

## 2021-01-21 ENCOUNTER — Ambulatory Visit (HOSPITAL_COMMUNITY): Admission: RE | Admit: 2021-01-21 | Payer: Medicare Other | Source: Home / Self Care | Admitting: Urology

## 2021-01-21 SURGERY — CYSTOSCOPY/URETEROSCOPY/HOLMIUM LASER/STENT PLACEMENT
Anesthesia: General | Laterality: Bilateral

## 2021-02-20 ENCOUNTER — Other Ambulatory Visit: Payer: Self-pay

## 2021-02-20 ENCOUNTER — Ambulatory Visit
Admission: EM | Admit: 2021-02-20 | Discharge: 2021-02-20 | Disposition: A | Discharge status: Home / Self Care | Payer: Medicare Other

## 2021-02-20 ENCOUNTER — Encounter: Payer: Self-pay | Admitting: *Deleted

## 2021-02-20 DIAGNOSIS — L97521 Non-pressure chronic ulcer of other part of left foot limited to breakdown of skin: Secondary | ICD-10-CM

## 2021-02-20 DIAGNOSIS — L03032 Cellulitis of left toe: Secondary | ICD-10-CM

## 2021-02-20 HISTORY — DX: Sepsis, unspecified organism: A41.9

## 2021-02-20 HISTORY — DX: COVID-19: U07.1

## 2021-02-20 HISTORY — DX: Non-pressure chronic ulcer of other part of right foot with unspecified severity: L97.519

## 2021-02-20 HISTORY — DX: Non-pressure chronic ulcer of other part of left foot with unspecified severity: L97.529

## 2021-02-20 MED ORDER — CEPHALEXIN 500 MG PO CAPS
500.0000 mg | ORAL_CAPSULE | Freq: Four times a day (QID) | ORAL | 0 refills | Status: DC
Start: 2021-02-20 — End: 2021-07-30

## 2021-02-20 NOTE — ED Provider Notes (Signed)
[facility]    CSN: [id number] Arrival date & time: 02/20/21  7622      History   Chief Complaint Chief Complaint  Patient presents with  . Toe Ulcers    HPI Charles Richard is a 87 y.o. male.   Charles Richard presents with his daughter/ caregiver with complaints of increased redness and ulcers to left great toe. Issues with recurrent toe ulcers and cellulitis in the past. Follows with podiatry and was just there last week. No fevers. No weakness, change in mentation, vomiting or other systemic concerns today. Daughter cares for his feet and noted increased redness to medial wound and new redness to the tip of toe over the past few days. She applies mupirocin ointment which has not been helping. He has diabetes. Scheduled to see his P[facility] next week, [facility]     ROS per HPI, negative if not otherwise mentioned.      Past Medical History:  Diagnosis Date  . AAA (abdominal aortic aneurysm) without rupture (HCC) 06/03/2017  . Arthritis   . Atrial fibrillation with rapid ventricular response (HCC) 04/2017   New diagnosis during admission for urosepsis secondary to left nephrolithiasis/ureteral stone with hydronephrosis  . COVID-19    04/2020  . Diabetes mellitus without complication (HCC)   . Dysrhythmia    a fib  . Essential hypertension   . Hard of hearing   . History of CVA (cerebrovascular accident) without residual deficits 05/2009   Admitted with expressive aphasia & confusion following a neurocardiogenic syncope event (related to dehydration, AKD & BB related hypotension/bradycardia)  . History of kidney stones   . Hyperlipidemia   . Left nephrolithiasis 04/2017   s/p L Ureteral stent for L hydronephrosis & UTI/Urosepsis.    . Myocardial infarction (HCC)   . Sepsis (HCC)   . Skin ulcers of foot, bilateral (HCC)   . Stroke [facility])    mild stroke no residuals    Patient Active Problem List   Diagnosis Date Noted  . Pneumonia due to COVID-19  virus 05/06/2020  . COVID-19 virus infection 05/05/2020  . Closed fracture of right proximal humerus 07/20/2017  . S/P reverse total shoulder arthroplasty, 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  . AAA (abdominal aortic aneurysm) without rupture (HCC) 06/03/2017  . Preop cardiovascular exam 06/01/2017  . Physical deconditioning   . Ureteral stone with hydronephrosis 05/09/2017  . Essential hypertension 05/09/2017  . Type 2 diabetes mellitus without complication (HCC) 05/09/2017  . Hyponatremia 05/09/2017  . AKI (acute kidney injury) (HCC) 05/09/2017  . Atrial fibrillation with RVR (HCC) 05/09/2017  . Sepsis secondary to UTI (HCC) 05/09/2017    Past Surgical History:  Procedure Laterality Date  . CHOLECYSTECTOMY    . CYSTOSCOPY W/ URETERAL STENT PLACEMENT Left 05/09/2017   Procedure: CYSTOSCOPY WITH RETROGRADE PYELOGRAM/URETERAL STENT PLACEMENT;  Surgeon: Jerilee Field, MD;  Location: [facility];  Service: Urology;  Laterality: Left;  . CYSTOSCOPY WITH RETROGRADE PYELOGRAM, URETEROSCOPY AND STENT PLACEMENT Left 06/08/2017   Procedure: CYSTOSCOPY WITH LEFT RETROGRADE PYELOGRAM, URETEROSCOPY HOLMIUM LASER LITHOTRIPSY BASKET EXTRACTION  AND STENT EXCHANGE;  Surgeon: Jerilee Field, MD;  Location: [facility];  Service: Urology;  Laterality: Left;  . EYE SURGERY     right  . HOLMIUM LASER APPLICATION Left 06/08/2017   Procedure: HOLMIUM LASER APPLICATION;  Surgeon: Jerilee Field, MD;  Location: [facility];  Service: Urology;  Laterality: Left;  . LITHOTRIPSY    . REVERSE SHOULDER ARTHROPLASTY Right 07/20/2017   Procedure: REVERSE RIGHT SHOULDER ARTHROPLASTY;  Surgeon: Yolonda Kida, MD;  Location: [facility] OR;  Service: Orthopedics;  Laterality: Right;  150 mins       Home Medications    Prior to Admission medications   Medication Sig Start Date End Date Taking? Authorizing Provider  apixaban (ELIQUIS) 2.5 MG TABS tablet Take 1 tablet (2.5 mg total) by mouth 2 (two) times daily. 05/11/17  Yes Vassie Loll,  MD  carvedilol (COREG) 6.25 MG tablet Take 6.25 mg by mouth daily with breakfast.   Yes [provider]  cholecalciferol (VITAMIN D) 1000 units tablet Take 1,000 Units by mouth at bedtime.   Yes [provider]  docusate sodium (COLACE) 100 MG capsule Take 200 mg by mouth daily.   Yes [provider]  fluticasone (FLONASE) 50 MCG/ACT nasal spray Place into both nostrils daily.   Yes [provider]  GABAPENTIN PO Take by mouth.   Yes [provider]  lisinopril (PRINIVIL,ZESTRIL) 40 MG tablet Take 0.5 tablets (20 mg total) by mouth daily with breakfast. HOLD UNTIL FOLLOW UP WITH PCP Patient taking differently: Take 20 mg by mouth daily with breakfast. 05/11/17  Yes Vassie Loll, MD  loratadine (CLARITIN) 10 MG tablet Take 10 mg by mouth daily with breakfast.    Yes [provider]  meclizine (ANTIVERT) 25 MG tablet Take 25 mg by mouth 2 (two) times daily.   Yes [provider]  metFORMIN (GLUCOPHAGE) 1000 MG tablet Take 1,000 mg by mouth 2 (two) times daily with a meal.   Yes [provider]  Multiple Vitamin (MULTIVITAMIN WITH MINERALS) TABS tablet Take 1 tablet by mouth at bedtime.   Yes [provider]  simvastatin (ZOCOR) 10 MG tablet Take 10 mg by mouth daily.   Yes [provider]  traMADol (ULTRAM) 50 MG tablet Take 100 mg by mouth every 6 (six) hours as needed for moderate pain.   Yes [provider]  cephALEXin (KEFLEX) 500 MG capsule Take 1 capsule (500 mg total) by mouth 4 (four) times daily. 02/20/21   Georgetta Haber, NP  Propylene Glycol (SYSTANE BALANCE) 0.6 % SOLN Place 1-2 drops into both eyes daily as needed (for dry eyes).    [provider]    Family History Family History  Problem Relation Age of Onset  . Lung cancer Father        black lung  . CAD Neg Hx   . Kidney Stones Neg Hx   . Aneurysm Neg Hx     Social History Social History   Tobacco Use  . Smoking  status: Never Smoker  . Smokeless tobacco: Never Used  Vaping Use  . Vaping Use: Never used  Substance Use Topics  . Alcohol use: No  . Drug use: No     Allergies   Patient has no known allergies.   Review of Systems Review of Systems   Physical Exam Triage Vital Signs ED Triage Vitals [02/20/21 0846]  Enc Vitals Group     BP      Pulse Rate 61     Resp 18     Temp 99.2 F (37.3 C)     Temp Source Temporal     SpO2 97 %     Weight      Height      Head Circumference      Peak Flow      Pain Score      Pain Loc      Pain Edu?  Excl. in GC?    No data found.  Updated Vital Signs Pulse 61   Temp 99.2 F (37.3 C) (Temporal)   Resp 18   SpO2 97%   Visual Acuity Right Eye Distance:   Left Eye Distance:   Bilateral Distance:    Right Eye Near:   Left Eye Near:    Bilateral Near:     Physical Exam Constitutional:      Appearance: He is well-developed.  Cardiovascular:     Rate and Rhythm: Normal rate.  Pulmonary:     Effort: Pulmonary effort is normal.  Skin:    General: Skin is warm and dry.     Comments: Left great toe with mild redness, chronic wound to medial aspect as well as interdigit space between great toe and second toe; deep red to tip of toe with redness surrounding; no active drainage; fungal toe nails; see photo   Neurological:     Mental Status: He is alert. Mental status is at baseline.        UC Treatments / Results  Labs (all labs ordered are listed, but only abnormal results are displayed) Labs Reviewed - No data to display  EKG   Radiology No results found.  Procedures Procedures (including critical care time)  Medications Ordered in UC Medications - No data to display  Initial Impression / Assessment and Plan / UC Course  I have reviewed the triage vital signs and the nursing notes.  Pertinent labs & imaging results that were available during my care of the patient were reviewed by me and considered in my  medical decision making (see chart for details).     Keflex has been effective in the past for similar, seen here in the past, (08/2020) with similar. Follow up for recheck next week as scheduled with PCP. Return precautions provided. Patient and daughterverbalized understanding and agreeable to plan.   Final Clinical Impressions(s) / UC Diagnoses   Final diagnoses:  Skin ulcer of toe of left foot, limited to breakdown of skin (HCC)  Cellulitis of toe of left foot     Discharge Instructions     Continue with wound care as usual for you.  Cleanse wounds daily with soap and water. Keep dry as able.  Complete course of antibiotics.  Please follow up with your primary care provider and your foot doctor for recheck. Return for any worsening.    ED Prescriptions    Medication Sig Dispense Auth. Provider   cephALEXin (KEFLEX) 500 MG capsule Take 1 capsule (500 mg total) by mouth 4 (four) times daily. 28 capsule Georgetta Haber, NP     PDMP not reviewed this encounter.   Georgetta Haber, NP 02/20/21 [phone]

## 2021-02-20 NOTE — Discharge Instructions (Signed)
Continue with wound care as usual for you.  Cleanse wounds daily with soap and water. Keep dry as able.  Complete course of antibiotics.  Please follow up with your primary care provider and your foot doctor for recheck. Return for any worsening.

## 2021-02-20 NOTE — ED Triage Notes (Signed)
Per caregiver, pt has recurrent toe ulcers; c/o ulcers to left great toe onset yesterday.  States normally treats with Mupiricin oint & Santyll oint, but pt often needs an abx.  Denies fevers at home.

## 2021-07-30 ENCOUNTER — Emergency Department (HOSPITAL_COMMUNITY): Payer: No Typology Code available for payment source

## 2021-07-30 ENCOUNTER — Inpatient Hospital Stay (HOSPITAL_COMMUNITY)
Admission: EM | Admit: 2021-07-30 | Discharge: 2021-08-02 | DRG: 682 | Disposition: A | Discharge status: Home Health | Payer: No Typology Code available for payment source | Attending: Internal Medicine | Admitting: Internal Medicine

## 2021-07-30 ENCOUNTER — Observation Stay (HOSPITAL_COMMUNITY): Payer: No Typology Code available for payment source

## 2021-07-30 ENCOUNTER — Other Ambulatory Visit: Payer: Self-pay

## 2021-07-30 DIAGNOSIS — S22088A Other fracture of T11-T12 vertebra, initial encounter for closed fracture: Secondary | ICD-10-CM | POA: Diagnosis not present

## 2021-07-30 DIAGNOSIS — W19XXXA Unspecified fall, initial encounter: Secondary | ICD-10-CM | POA: Diagnosis present

## 2021-07-30 DIAGNOSIS — N189 Chronic kidney disease, unspecified: Secondary | ICD-10-CM | POA: Diagnosis present

## 2021-07-30 DIAGNOSIS — Z8673 Personal history of transient ischemic attack (TIA), and cerebral infarction without residual deficits: Secondary | ICD-10-CM

## 2021-07-30 DIAGNOSIS — E875 Hyperkalemia: Secondary | ICD-10-CM | POA: Diagnosis present

## 2021-07-30 DIAGNOSIS — Z79899 Other long term (current) drug therapy: Secondary | ICD-10-CM

## 2021-07-30 DIAGNOSIS — Z8616 Personal history of COVID-19: Secondary | ICD-10-CM

## 2021-07-30 DIAGNOSIS — L97529 Non-pressure chronic ulcer of other part of left foot with unspecified severity: Secondary | ICD-10-CM | POA: Diagnosis present

## 2021-07-30 DIAGNOSIS — N1831 Chronic kidney disease, stage 3a: Secondary | ICD-10-CM | POA: Diagnosis present

## 2021-07-30 DIAGNOSIS — I10 Essential (primary) hypertension: Secondary | ICD-10-CM | POA: Diagnosis present

## 2021-07-30 DIAGNOSIS — N179 Acute kidney failure, unspecified: Principal | ICD-10-CM | POA: Diagnosis present

## 2021-07-30 DIAGNOSIS — D631 Anemia in chronic kidney disease: Secondary | ICD-10-CM | POA: Diagnosis present

## 2021-07-30 DIAGNOSIS — I714 Abdominal aortic aneurysm, without rupture, unspecified: Secondary | ICD-10-CM | POA: Diagnosis present

## 2021-07-30 DIAGNOSIS — Z7901 Long term (current) use of anticoagulants: Secondary | ICD-10-CM

## 2021-07-30 DIAGNOSIS — S22089A Unspecified fracture of T11-T12 vertebra, initial encounter for closed fracture: Secondary | ICD-10-CM | POA: Diagnosis present

## 2021-07-30 DIAGNOSIS — E785 Hyperlipidemia, unspecified: Secondary | ICD-10-CM | POA: Diagnosis present

## 2021-07-30 DIAGNOSIS — I4821 Permanent atrial fibrillation: Secondary | ICD-10-CM

## 2021-07-30 DIAGNOSIS — R000 Tachycardia, unspecified: Secondary | ICD-10-CM

## 2021-07-30 DIAGNOSIS — I129 Hypertensive chronic kidney disease with stage 1 through stage 4 chronic kidney disease, or unspecified chronic kidney disease: Secondary | ICD-10-CM | POA: Diagnosis present

## 2021-07-30 DIAGNOSIS — Z66 Do not resuscitate: Secondary | ICD-10-CM | POA: Diagnosis present

## 2021-07-30 DIAGNOSIS — H9190 Unspecified hearing loss, unspecified ear: Secondary | ICD-10-CM | POA: Diagnosis present

## 2021-07-30 DIAGNOSIS — Z87442 Personal history of urinary calculi: Secondary | ICD-10-CM

## 2021-07-30 DIAGNOSIS — E538 Deficiency of other specified B group vitamins: Secondary | ICD-10-CM | POA: Diagnosis present

## 2021-07-30 DIAGNOSIS — Z9049 Acquired absence of other specified parts of digestive tract: Secondary | ICD-10-CM

## 2021-07-30 DIAGNOSIS — E860 Dehydration: Secondary | ICD-10-CM

## 2021-07-30 DIAGNOSIS — K5900 Constipation, unspecified: Secondary | ICD-10-CM | POA: Diagnosis present

## 2021-07-30 DIAGNOSIS — Z960 Presence of urogenital implants: Secondary | ICD-10-CM | POA: Diagnosis present

## 2021-07-30 DIAGNOSIS — E1122 Type 2 diabetes mellitus with diabetic chronic kidney disease: Secondary | ICD-10-CM | POA: Diagnosis present

## 2021-07-30 DIAGNOSIS — L97519 Non-pressure chronic ulcer of other part of right foot with unspecified severity: Secondary | ICD-10-CM | POA: Diagnosis present

## 2021-07-30 DIAGNOSIS — E119 Type 2 diabetes mellitus without complications: Secondary | ICD-10-CM

## 2021-07-30 DIAGNOSIS — U071 COVID-19: Secondary | ICD-10-CM | POA: Diagnosis present

## 2021-07-30 DIAGNOSIS — I252 Old myocardial infarction: Secondary | ICD-10-CM

## 2021-07-30 DIAGNOSIS — Z79891 Long term (current) use of opiate analgesic: Secondary | ICD-10-CM

## 2021-07-30 DIAGNOSIS — Z96611 Presence of right artificial shoulder joint: Secondary | ICD-10-CM | POA: Diagnosis present

## 2021-07-30 DIAGNOSIS — N200 Calculus of kidney: Secondary | ICD-10-CM | POA: Diagnosis present

## 2021-07-30 DIAGNOSIS — I472 Ventricular tachycardia: Secondary | ICD-10-CM | POA: Diagnosis not present

## 2021-07-30 DIAGNOSIS — E11621 Type 2 diabetes mellitus with foot ulcer: Secondary | ICD-10-CM | POA: Diagnosis present

## 2021-07-30 LAB — COMPREHENSIVE METABOLIC PANEL
ALT: 15 U/L (ref 0–44)
AST: 17 U/L (ref 15–41)
Albumin: 3.2 g/dL — ABNORMAL LOW (ref 3.5–5.0)
Alkaline Phosphatase: 39 U/L (ref 38–126)
Anion gap: 9 (ref 5–15)
BUN: 40 mg/dL — ABNORMAL HIGH (ref 8–23)
CO2: 23 mmol/L (ref 22–32)
Calcium: 8.9 mg/dL (ref 8.9–10.3)
Chloride: 103 mmol/L (ref 98–111)
Creatinine, Ser: 2.04 mg/dL — ABNORMAL HIGH (ref 0.61–1.24)
GFR, Estimated: 31 mL/min — ABNORMAL LOW (ref >60)
Glucose, Bld: 159 mg/dL — ABNORMAL HIGH (ref 70–99)
Potassium: 5.4 mmol/L — ABNORMAL HIGH (ref 3.5–5.1)
Sodium: 135 mmol/L (ref 135–145)
Total Bilirubin: 0.6 mg/dL (ref 0.3–1.2)
Total Protein: 6.4 g/dL — ABNORMAL LOW (ref 6.5–8.1)

## 2021-07-30 LAB — RESP PANEL BY RT-PCR (FLU A&B, COVID) ARPGX2
Influenza A by PCR: NEGATIVE
Influenza B by PCR: NEGATIVE
SARS Coronavirus 2 by RT PCR: POSITIVE — AB

## 2021-07-30 LAB — CBC WITH DIFFERENTIAL/PLATELET
Abs Immature Granulocytes: 0.06 10*3/uL (ref 0.00–0.07)
Basophils Absolute: 0.1 10*3/uL (ref 0.0–0.1)
Basophils Relative: 1 %
Eosinophils Absolute: 0.2 10*3/uL (ref 0.0–0.5)
Eosinophils Relative: 2 %
HCT: 35.9 % — ABNORMAL LOW (ref 39.0–52.0)
Hemoglobin: 11.5 g/dL — ABNORMAL LOW (ref 13.0–17.0)
Immature Granulocytes: 1 %
Lymphocytes Relative: 12 %
Lymphs Abs: 1.1 10*3/uL (ref 0.7–4.0)
MCH: 32.1 pg (ref 26.0–34.0)
MCHC: 32 g/dL (ref 30.0–36.0)
MCV: 100.3 fL — ABNORMAL HIGH (ref 80.0–100.0)
Monocytes Absolute: 0.9 10*3/uL (ref 0.1–1.0)
Monocytes Relative: 10 %
Neutro Abs: 6.8 10*3/uL (ref 1.7–7.7)
Neutrophils Relative %: 74 %
Platelets: 251 10*3/uL (ref 150–400)
RBC: 3.58 MIL/uL — ABNORMAL LOW (ref 4.22–5.81)
RDW: 13 % (ref 11.5–15.5)
WBC: 9.2 10*3/uL (ref 4.0–10.5)
nRBC: 0 % (ref 0.0–0.2)

## 2021-07-30 LAB — URINALYSIS, ROUTINE W REFLEX MICROSCOPIC
Bacteria, UA: NONE SEEN
Bilirubin Urine: NEGATIVE
Glucose, UA: NEGATIVE mg/dL
Hgb urine dipstick: NEGATIVE
Ketones, ur: NEGATIVE mg/dL
Nitrite: NEGATIVE
Protein, ur: NEGATIVE mg/dL
Specific Gravity, Urine: 1.01 (ref 1.005–1.030)
pH: 5 (ref 5.0–8.0)

## 2021-07-30 LAB — MAGNESIUM: Magnesium: 2 mg/dL (ref 1.7–2.4)

## 2021-07-30 LAB — GLUCOSE, CAPILLARY: Glucose-Capillary: 141 mg/dL — ABNORMAL HIGH (ref 70–99)

## 2021-07-30 LAB — CBG MONITORING, ED: Glucose-Capillary: 144 mg/dL — ABNORMAL HIGH (ref 70–99)

## 2021-07-30 LAB — HEMOGLOBIN A1C
Hgb A1c MFr Bld: 6.4 % — ABNORMAL HIGH (ref 4.8–5.6)
Mean Plasma Glucose: 136.98 mg/dL

## 2021-07-30 LAB — LIPASE, BLOOD: Lipase: 30 U/L (ref 11–51)

## 2021-07-30 MED ORDER — CARVEDILOL 6.25 MG PO TABS
6.2500 mg | ORAL_TABLET | Freq: Every day | ORAL | Status: DC
  Administered 2021-07-31: 6.25 mg via ORAL
  Filled 2021-07-30: qty 1

## 2021-07-30 MED ORDER — GADOBUTROL 1 MMOL/ML IV SOLN
10.0000 mL | Freq: Once | INTRAVENOUS | Status: AC | PRN
  Administered 2021-07-30: 10 mL via INTRAVENOUS

## 2021-07-30 MED ORDER — SODIUM CHLORIDE 0.9 % IV BOLUS
250.0000 mL | Freq: Once | INTRAVENOUS | Status: AC
  Administered 2021-07-30: 250 mL via INTRAVENOUS

## 2021-07-30 MED ORDER — ZOLPIDEM TARTRATE 10 MG PO TABS
10.0000 mg | ORAL_TABLET | Freq: Once | ORAL | Status: AC
  Administered 2021-07-31: 10 mg via ORAL
  Filled 2021-07-30: qty 1

## 2021-07-30 MED ORDER — DOCUSATE SODIUM 100 MG PO CAPS: 200.0000 mg | ORAL_CAPSULE | Freq: Every day | ORAL | Status: DC

## 2021-07-30 MED ORDER — INSULIN ASPART 100 UNIT/ML IJ SOLN
0.0000 [IU] | Freq: Every day | INTRAMUSCULAR | Status: DC
  Filled 2021-07-30: qty 0.05

## 2021-07-30 MED ORDER — ONDANSETRON HCL 4 MG/2ML IJ SOLN
4.0000 mg | Freq: Four times a day (QID) | INTRAMUSCULAR | Status: DC | PRN
  Administered 2021-07-31: 4 mg via INTRAVENOUS
  Filled 2021-07-30: qty 2

## 2021-07-30 MED ORDER — APIXABAN 2.5 MG PO TABS
2.5000 mg | ORAL_TABLET | Freq: Two times a day (BID) | ORAL | Status: DC
  Administered 2021-07-30 – 2021-08-02 (×6): 2.5 mg via ORAL
  Filled 2021-07-30 (×6): qty 1

## 2021-07-30 MED ORDER — TRAMADOL HCL 50 MG PO TABS
100.0000 mg | ORAL_TABLET | Freq: Four times a day (QID) | ORAL | Status: DC | PRN
  Administered 2021-07-30 – 2021-07-31 (×2): 100 mg via ORAL
  Filled 2021-07-30 (×3): qty 2

## 2021-07-30 MED ORDER — FLUTICASONE PROPIONATE 50 MCG/ACT NA SUSP
1.0000 | Freq: Every day | NASAL | Status: DC
  Administered 2021-08-01 – 2021-08-02 (×2): 1 via NASAL
  Filled 2021-07-30: qty 16

## 2021-07-30 MED ORDER — HYDRALAZINE HCL 25 MG PO TABS: 25.0000 mg | ORAL_TABLET | Freq: Four times a day (QID) | ORAL | Status: DC | PRN

## 2021-07-30 MED ORDER — ACETAMINOPHEN 650 MG RE SUPP: 650.0000 mg | Freq: Four times a day (QID) | RECTAL | Status: DC | PRN

## 2021-07-30 MED ORDER — ACETAMINOPHEN 325 MG PO TABS
650.0000 mg | ORAL_TABLET | Freq: Four times a day (QID) | ORAL | Status: DC | PRN
  Administered 2021-07-31 (×2): 650 mg via ORAL
  Filled 2021-07-30 (×2): qty 2

## 2021-07-30 MED ORDER — ONDANSETRON HCL 4 MG PO TABS: 4.0000 mg | ORAL_TABLET | Freq: Four times a day (QID) | ORAL | Status: DC | PRN

## 2021-07-30 MED ORDER — SODIUM CHLORIDE 0.9 % IV SOLN: INTRAVENOUS | Status: DC

## 2021-07-30 MED ORDER — MECLIZINE HCL 25 MG PO TABS
25.0000 mg | ORAL_TABLET | Freq: Two times a day (BID) | ORAL | Status: DC
  Administered 2021-07-30 – 2021-08-02 (×6): 25 mg via ORAL
  Filled 2021-07-30 (×6): qty 1

## 2021-07-30 MED ORDER — INSULIN ASPART 100 UNIT/ML IJ SOLN
0.0000 [IU] | Freq: Three times a day (TID) | INTRAMUSCULAR | Status: DC
  Administered 2021-07-31: 2 [IU] via SUBCUTANEOUS
  Administered 2021-08-01: 1 [IU] via SUBCUTANEOUS
  Filled 2021-07-30: qty 0.09

## 2021-07-30 MED ORDER — SODIUM CHLORIDE 0.9 % IV BOLUS
1000.0000 mL | Freq: Once | INTRAVENOUS | Status: AC
  Administered 2021-07-30: 1000 mL via INTRAVENOUS

## 2021-07-30 MED ORDER — SIMVASTATIN 10 MG PO TABS: 10.0000 mg | ORAL_TABLET | Freq: Every day | ORAL | Status: DC

## 2021-07-30 NOTE — ED Provider Notes (Signed)
[facility]-EMERGENCY DEPT Provider Note   CSN: [id number] Arrival date & time: 07/30/21  1343     History Chief Complaint  Patient presents with  . Flank Pain    Charles Richard is a 87 y.o. male.  Patient has been vomiting for a week.  He also had a fall about a week ago.  Patient has a history of kidney stones and has been passing small stones supposedly  The history is provided by the patient and medical records. No language interpreter was used.  Flank Pain This is a new problem. The problem occurs constantly. The problem has been gradually worsening. Pertinent negatives include no chest pain, no abdominal pain and no headaches. Nothing aggravates the symptoms. Nothing relieves the symptoms. He has tried nothing for the symptoms.      Past Medical History:  Diagnosis Date  . AAA (abdominal aortic aneurysm) without rupture (HCC) 06/03/2017  . Arthritis   . Atrial fibrillation with rapid ventricular response (HCC) 04/2017   New diagnosis during admission for urosepsis secondary to left nephrolithiasis/ureteral stone with hydronephrosis  . COVID-19    04/2020  . Diabetes mellitus without complication (HCC)   . Dysrhythmia    a fib  . Essential hypertension   . Hard of hearing   . History of CVA (cerebrovascular accident) without residual deficits 05/2009   Admitted with expressive aphasia & confusion following a neurocardiogenic syncope event (related to dehydration, AKD & BB related hypotension/bradycardia)  . History of kidney stones   . Hyperlipidemia   . Left nephrolithiasis 04/2017   s/p L Ureteral stent for L hydronephrosis & UTI/Urosepsis.    . Myocardial infarction (HCC)   . Sepsis (HCC)   . Skin ulcers of foot, bilateral (HCC)   . Stroke [facility])    mild stroke no residuals    Patient Active Problem List   Diagnosis Date Noted  . Pneumonia due to COVID-19 virus 05/06/2020  . COVID-19 virus infection 05/05/2020  . Closed fracture of  right proximal humerus 07/20/2017  . S/P reverse total shoulder arthroplasty, 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  . AAA (abdominal aortic aneurysm) without rupture (HCC) 06/03/2017  . Preop cardiovascular exam 06/01/2017  . Physical deconditioning   . Ureteral stone with hydronephrosis 05/09/2017  . Essential hypertension 05/09/2017  . Type 2 diabetes mellitus without complication (HCC) 05/09/2017  . Hyponatremia 05/09/2017  . AKI (acute kidney injury) (HCC) 05/09/2017  . Atrial fibrillation with RVR (HCC) 05/09/2017  . Sepsis secondary to UTI (HCC) 05/09/2017    Past Surgical History:  Procedure Laterality Date  . CHOLECYSTECTOMY    . CYSTOSCOPY W/ URETERAL STENT PLACEMENT Left 05/09/2017   Procedure: CYSTOSCOPY WITH RETROGRADE PYELOGRAM/URETERAL STENT PLACEMENT;  Surgeon: Jerilee Field, MD;  Location: [facility] ORS;  Service: Urology;  Laterality: Left;  . CYSTOSCOPY WITH RETROGRADE PYELOGRAM, URETEROSCOPY AND STENT PLACEMENT Left 06/08/2017   Procedure: CYSTOSCOPY WITH LEFT RETROGRADE PYELOGRAM, URETEROSCOPY HOLMIUM LASER LITHOTRIPSY BASKET EXTRACTION  AND STENT EXCHANGE;  Surgeon: Jerilee Field, MD;  Location: [facility] ORS;  Service: Urology;  Laterality: Left;  . EYE SURGERY     right  . HOLMIUM LASER APPLICATION Left 06/08/2017   Procedure: HOLMIUM LASER APPLICATION;  Surgeon: Jerilee Field, MD;  Location: [facility] OR[facility];  Service: Urology;  Laterality: Left;  . LITHOTRIPSY    . REVERSE SHOULDER ARTHROPLASTY Right 07/20/2017   Procedure: REVERSE RIGHT SHOULDER ARTHROPLASTY;  Surgeon: Yolonda Kida, MD;  Location: [facility] OR;  Service: Orthopedics;  Laterality: Right;  150 mins  Family History  Problem Relation Age of Onset  . Lung cancer Father        black lung  . CAD Neg Hx   . Kidney Stones Neg Hx   . Aneurysm Neg Hx     Social History   Tobacco Use  . Smoking status: Never  . Smokeless tobacco: Never  Vaping Use  . Vaping Use: Never used  Substance Use Topics  . Alcohol use: No   . Drug use: No    Home Medications Prior to Admission medications   Medication Sig Start Date End Date Taking? Authorizing Provider  apixaban (ELIQUIS) 2.5 MG TABS tablet Take 1 tablet (2.5 mg total) by mouth 2 (two) times daily. 05/11/17   Vassie LollMadera, Carlos, MD  carvedilol (COREG) 6.25 MG tablet Take 6.25 mg by mouth daily with breakfast.    [provider]  cephALEXin (KEFLEX) 500 MG capsule Take 1 capsule (500 mg total) by mouth 4 (four) times daily. 02/20/21   Georgetta HaberBurky, Natalie B, NP  cholecalciferol (VITAMIN D) 1000 units tablet Take 1,000 Units by mouth at bedtime.    [provider]  docusate sodium (COLACE) 100 MG capsule Take 200 mg by mouth daily.    [provider]  fluticasone (FLONASE) 50 MCG/ACT nasal spray Place into both nostrils daily.    [provider]  GABAPENTIN PO Take by mouth.    [provider]  lisinopril (PRINIVIL,ZESTRIL) 40 MG tablet Take 0.5 tablets (20 mg total) by mouth daily with breakfast. HOLD UNTIL FOLLOW UP WITH PCP Patient taking differently: Take 20 mg by mouth daily with breakfast. 05/11/17   Vassie LollMadera, Carlos, MD  loratadine (CLARITIN) 10 MG tablet Take 10 mg by mouth daily with breakfast.     [provider]  meclizine (ANTIVERT) 25 MG tablet Take 25 mg by mouth 2 (two) times daily.    [provider]  metFORMIN (GLUCOPHAGE) 1000 MG tablet Take 1,000 mg by mouth 2 (two) times daily with a meal.    [provider]  Multiple Vitamin (MULTIVITAMIN WITH MINERALS) TABS tablet Take 1 tablet by mouth at bedtime.    [provider]  Propylene Glycol (SYSTANE BALANCE) 0.6 % SOLN Place 1-2 drops into both eyes daily as needed (for dry eyes).    [provider]  simvastatin (ZOCOR) 10 MG tablet Take 10 mg by mouth daily.    [provider]  traMADol (ULTRAM) 50 MG tablet Take 100 mg by mouth every 6 (six) hours as needed for moderate pain.    [provider]     Allergies    Patient has no known allergies.  Review of Systems   Review of Systems  Constitutional:  Negative for appetite change and fatigue.  HENT:  Negative for congestion, ear discharge and sinus pressure.   Eyes:  Negative for discharge.  Respiratory:  Negative for cough.   Cardiovascular:  Negative for chest pain.  Gastrointestinal:  Negative for abdominal pain and diarrhea.  Genitourinary:  Positive for flank pain. Negative for frequency and hematuria.  Musculoskeletal:  Negative for back pain.  Skin:  Negative for rash.  Neurological:  Negative for seizures and headaches.  Psychiatric/Behavioral:  Negative for hallucinations.    Physical Exam Updated Vital Signs BP (!) 136/98   Pulse 77   Temp 98.2 F (36.8 C) (Oral)   Resp 17   Ht 6' (1.829 m)   Wt 97.5 kg   SpO2 98%   BMI 29.16 kg/m  Physical Exam Vitals and nursing note reviewed.  Constitutional:      Appearance: He is well-developed.  HENT:     Head: Normocephalic.     Nose: Nose normal.  Eyes:     General: No scleral icterus.    Conjunctiva/sclera: Conjunctivae normal.  Neck:     Thyroid: No thyromegaly.  Cardiovascular:     Rate and Rhythm: Normal rate and regular rhythm.     Heart sounds: No murmur heard.   No friction rub. No gallop.  Pulmonary:     Breath sounds: No stridor. No wheezing or rales.  Chest:     Chest wall: No tenderness.  Abdominal:     General: There is no distension.     Tenderness: There is no abdominal tenderness. There is no rebound.  Musculoskeletal:        General: Normal range of motion.     Cervical back: Neck supple.  Lymphadenopathy:     Cervical: No cervical adenopathy.  Skin:    Findings: No erythema or rash.  Neurological:     Mental Status: He is alert and oriented to person, place, and time.     Motor: No abnormal muscle tone.     Coordination: Coordination normal.  Psychiatric:        Behavior: Behavior normal.    ED Results / Procedures /  Treatments   Labs (all labs ordered are listed, but only abnormal results are displayed) Labs Reviewed  CBC WITH DIFFERENTIAL/PLATELET - Abnormal; Notable for the following components:      Result Value   RBC 3.58 (*)    Hemoglobin 11.5 (*)    HCT 35.9 (*)    MCV 100.3 (*)    All other components within normal limits  COMPREHENSIVE METABOLIC PANEL - Abnormal; Notable for the following components:   Potassium 5.4 (*)    Glucose, Bld 159 (*)    BUN 40 (*)    Creatinine, Ser 2.04 (*)    Total Protein 6.4 (*)    Albumin 3.2 (*)    GFR, Estimated 31 (*)    All other components within normal limits  URINALYSIS, ROUTINE W REFLEX MICROSCOPIC - Abnormal; Notable for the following components:   APPearance HAZY (*)    Leukocytes,Ua SMALL (*)    All other components within normal limits  RESP PANEL BY RT-PCR (FLU A&B, COVID) ARPGX2  LIPASE, BLOOD    EKG None  Radiology CT Renal Stone Study  Result Date: 07/30/2021 CLINICAL DATA:  Bilateral flank pain. History of kidney stone surgery. EXAM: CT ABDOMEN AND PELVIS WITHOUT CONTRAST TECHNIQUE: Multidetector CT imaging of the abdomen and pelvis was performed following the standard protocol without IV contrast. COMPARISON:  12/13/2020 FINDINGS: Lower chest: Small, right greater than left, pleural effusions. Mild dependent subsegmental atelectasis in the lower lobes. Hepatobiliary: No focal liver abnormality is seen. Status post cholecystectomy. No biliary dilatation. Pancreas: Unremarkable. No pancreatic ductal dilatation or surrounding inflammatory changes. Spleen: Normal in size without focal abnormality. Adrenals/Urinary Tract: No adrenal masses. Bilateral renal cortical thinning. 4.8 cm, medial upper pole right renal cyst. Exophytic lateral right kidney upper pole cyst. Small hyperattenuating focus along the wall of the medial upper pole cyst, 8 mm in size. These findings are stable. No other renal masses. Bilateral intrarenal stones, similar to  the prior CT. No hydronephrosis. Ureters are normal in course and in caliber. No ureteral stones. Normal bladder. Stomach/Bowel: Subtle inflammatory type stranding in the fat lateral to the proximal descending colon. This  is new from the prior CT. This portion of the colon is not well distended. No convincing wall thickening. Remainder of the colon is unremarkable. Normal stomach and small bowel. Thick rim calcified mass abuts the 3rd to 4th portion of the duodenum, unchanged, measuring 3.2 cm. Vascular/Lymphatic: Aortic atherosclerosis. No aneurysm. No enlarged lymph nodes. Reproductive: Unremarkable. Other: No abdominal wall hernia or abnormality. No abdominopelvic ascites. Musculoskeletal: Air extends along the superior margin of the T11 vertebra, just below the upper endplate, new compared to the prior CT. There is ill-defined increased attenuation in the retroperitoneal fat anterior to this vertebra and posterior to the diaphragmatic or a. this may reflect a recent fracture or could be infectious/inflammatory in etiology. No other evidence of a fracture.  No suspicious bone lesion. IMPRESSION: 1. No evidence of a ureteral stone or obstructive uropathy. 2. Bilateral nonobstructing intrarenal stones. Diffuse renal cortical thinning consistent with medical renal disease. These findings are stable. 3. Questionable minimal recent fracture of the upper endplate of T11 versus inflammation/infection at this level reflected by increased attenuation in the adjacent fat. These findings could be further assessed and characterized with MRI without and with contrast. 4. Aortic atherosclerosis. 5. Stable, 3.2 cm mass adjacent to the duodenum with a thick rim of peripheral calcification. Etiology of this is unclear. It does not appear to arise from a vessel although an aneurysm remains possible. Electronically Signed   By: Amie Portland M.D.   On: 07/30/2021 15:00    Procedures Procedures   Medications Ordered in  ED Medications  sodium chloride 0.9 % bolus 250 mL (0 mLs Intravenous Stopped 07/30/21 1519)  sodium chloride 0.9 % bolus 1,000 mL (1,000 mLs Intravenous Bolus 07/30/21 1551)    ED Course  I have reviewed the triage vital signs and the nursing notes.  Pertinent labs & imaging results that were available during my care of the patient were reviewed by me and considered in my medical decision making (see chart for details).    MDM Rules/Calculators/A&P                          Patient with an AKI and mild hyperkalemia.  He also has a small T11 fracture.  Patient will be admitted for hydration Final Clinical Impression(s) / ED Diagnoses Final diagnoses:  AKI (acute kidney injury) [facility])    Rx / DC Orders ED Discharge Orders     None        Bethann Berkshire, MD 08/01/21 [phone]

## 2021-07-30 NOTE — ED Notes (Signed)
Updated pt's daughter on room status due to pt's +covid dianosis. Daughter upset bc pt still in ED and bc his Remus Loffler was not put in his MAR as one of his nightly meds and bc no doctor has told her his MRI results. Daughter assured that hospitalist will be contacted and med rec tech will also be contacted to verify his meds again.

## 2021-07-30 NOTE — H&P (Signed)
History and Physical    Charles Richard DXI:[id number] DOB: [birth year] DOA: 07/30/2021  PCP: [facility]  Patient coming from: Home  I have personally briefly reviewed patient's old medical records in [facility]  Chief Complaint: Back pain/nausea/vomiting/fall/weakness  HPI: Charles Richard is a 87 y.o. male with medical history significant of permanent atrial fibrillation, CKD stage IIIa, recurrent kidney stones, AAA, type 2 diabetes mellitus, hyperlipidemia, hypertension, hard of hearing and multiple other problems was brought in to the ED by daughter with multiple complaints.  History was provided by the daughter since patient himself is extremely hard of hearing.  He lives with the daughter as well.  According to daughter, patient fell about 8 days ago/last Friday at night.  Since then, he has been complaining of back pain and bilateral flank pain and has passed several kidney stones, according to daughter they were big in size.  He has been having intermittent nausea as well as vomiting.  Has not been eating much due to the fear of vomiting.  His last bowel movement was 7 days ago.  Since he continued to get weaker and weaker, she decided to bring him to the ED.  No history of fever, chills, sweating, chest pain, shortness of breath, any urinary complaints, any neurological complaint, any recent travel or sick contact.  ED Course: Upon arrival to ED, he was hemodynamically stable.  CMP showed mild hyperkalemia, slightly elevated creatinine than his baseline.  CBC showed baseline anemia.  No leukocytosis.  UA unremarkable.  Lipase normal.  CT renal stone did not show any obstructive ureteral stone or obstructive uropathy, he has bilateral nonobstructing intrarenal stones.  There was some question of T11 fracture versus inflammation/infection.  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  Hospital service were consulted for further management of dehydration, AKI on  CKD.  Review of Systems: As per HPI otherwise negative.    Past Medical History:  Diagnosis Date   AAA (abdominal aortic aneurysm) without rupture (HCC) 06/03/2017   Arthritis    Atrial fibrillation with rapid ventricular response (HCC) 04/2017   New diagnosis during admission for urosepsis secondary to left nephrolithiasis/ureteral stone with hydronephrosis   COVID-19    04/2020   Diabetes mellitus without complication (HCC)    Dysrhythmia    a fib   Essential hypertension    Hard of hearing    History of CVA (cerebrovascular accident) without residual deficits 05/2009   Admitted with expressive aphasia & confusion following a neurocardiogenic syncope event (related to dehydration, AKD & BB related hypotension/bradycardia)   History of kidney stones    Hyperlipidemia    Left nephrolithiasis 04/2017   s/p L Ureteral stent for L hydronephrosis & UTI/Urosepsis.     Myocardial infarction (HCC)    Sepsis (HCC)    Skin ulcers of foot, bilateral (HCC)    Stroke (HCC)    mild stroke no residuals    Past Surgical History:  Procedure Laterality Date   CHOLECYSTECTOMY     CYSTOSCOPY W/ URETERAL STENT PLACEMENT Left 05/09/2017   Procedure: CYSTOSCOPY WITH RETROGRADE PYELOGRAM/URETERAL STENT PLACEMENT;  Surgeon: Jerilee Field, MD;  Location: [facility];  Service: Urology;  Laterality: Left;   CYSTOSCOPY WITH RETROGRADE PYELOGRAM, URETEROSCOPY AND STENT PLACEMENT Left 06/08/2017   Procedure: CYSTOSCOPY WITH LEFT RETROGRADE PYELOGRAM, URETEROSCOPY HOLMIUM LASER LITHOTRIPSY BASKET EXTRACTION  AND STENT EXCHANGE;  Surgeon: Jerilee Field, MD;  Location: [facility];  Service: Urology;  Laterality: Left;   EYE SURGERY  right   HOLMIUM LASER APPLICATION Left 06/08/2017   Procedure: HOLMIUM LASER APPLICATION;  Surgeon: Jerilee Field, MD;  Location: [facility];  Service: Urology;  Laterality: Left;   LITHOTRIPSY     REVERSE SHOULDER ARTHROPLASTY Right 07/20/2017   Procedure: REVERSE RIGHT SHOULDER  ARTHROPLASTY;  Surgeon: Yolonda Kida, MD;  Location: [facility] OR;  Service: Orthopedics;  Laterality: Right;  150 mins     reports that he has never smoked. He has never used smokeless tobacco. He reports that he does not drink alcohol and does not use drugs.  No Known Allergies  Family History  Problem Relation Age of Onset   Lung cancer Father        black lung   CAD Neg Hx    Kidney Stones Neg Hx    Aneurysm Neg Hx     Prior to Admission medications   Medication Sig Start Date End Date Taking? Authorizing Provider  apixaban (ELIQUIS) 2.5 MG TABS tablet Take 1 tablet (2.5 mg total) by mouth 2 (two) times daily. 05/11/17   Vassie Loll, MD  carvedilol (COREG) 6.25 MG tablet Take 6.25 mg by mouth daily with breakfast.    [provider]  cephALEXin (KEFLEX) 500 MG capsule Take 1 capsule (500 mg total) by mouth 4 (four) times daily. 02/20/21   Georgetta Haber, NP  cholecalciferol (VITAMIN D) 1000 units tablet Take 1,000 Units by mouth at bedtime.    [provider]  docusate sodium (COLACE) 100 MG capsule Take 200 mg by mouth daily.    [provider]  fluticasone (FLONASE) 50 MCG/ACT nasal spray Place into both nostrils daily.    [provider]  GABAPENTIN PO Take by mouth.    [provider]  lisinopril (PRINIVIL,ZESTRIL) 40 MG tablet Take 0.5 tablets (20 mg total) by mouth daily with breakfast. HOLD UNTIL FOLLOW UP WITH PCP Patient taking differently: Take 20 mg by mouth daily with breakfast. 05/11/17   Vassie Loll, MD  loratadine (CLARITIN) 10 MG tablet Take 10 mg by mouth daily with breakfast.     [provider]  meclizine (ANTIVERT) 25 MG tablet Take 25 mg by mouth 2 (two) times daily.    [provider]  metFORMIN (GLUCOPHAGE) 1000 MG tablet Take 1,000 mg by mouth 2 (two) times daily with a meal.    [provider]  Multiple Vitamin (MULTIVITAMIN WITH MINERALS) TABS tablet Take 1 tablet by mouth at  bedtime.    [provider]  Propylene Glycol (SYSTANE BALANCE) 0.6 % SOLN Place 1-2 drops into both eyes daily as needed (for dry eyes).    [provider]  simvastatin (ZOCOR) 10 MG tablet Take 10 mg by mouth daily.    [provider]  traMADol (ULTRAM) 50 MG tablet Take 100 mg by mouth every 6 (six) hours as needed for moderate pain.    [provider]    Physical Exam: Vitals:   07/30/21 1600 07/30/21 1615 07/30/21 1630 07/30/21 1645  BP: 106/66 108/64 112/86 (!) 136/98  Pulse: 94 90 96 77  Resp: 17 17    Temp:      TempSrc:      SpO2: 98% 100% 98% 98%  Weight:      Height:        Constitutional: NAD, calm, comfortable Vitals:   07/30/21 1600 07/30/21 1615 07/30/21 1630 07/30/21 1645  BP: 106/66 108/64 112/86 (!) 136/98  Pulse: 94 90 96 77  Resp: 17 17  Temp:      TempSrc:      SpO2: 98% 100% 98% 98%  Weight:      Height:       Eyes: PERRL, lids and conjunctivae normal, hard of hearing ENMT: Mucous membranes are moist. Posterior pharynx clear of any exudate or lesions.Normal dentition.  Neck: normal, supple, no masses, no thyromegaly Respiratory: clear to auscultation bilaterally, no wheezing, no crackles. Normal respiratory effort. No accessory muscle use.  Cardiovascular: Regular rate and rhythm, no murmurs / rubs / gallops. No extremity edema. 2+ pedal pulses. No carotid bruits.  Abdomen: no tenderness, no masses palpated. No hepatosplenomegaly. Bowel sounds positive.  Musculoskeletal: no clubbing / cyanosis. No joint deformity upper and lower extremities. Good ROM, no contractures. Normal muscle tone.  Low mid back tenderness. Skin: no rashes, lesions, ulcers. No induration Neurologic: CN 2-12 grossly intact. Sensation intact, DTR normal. Strength 5/5 in all 4.  Psychiatric: Normal judgment and insight. Alert and oriented x 3. Normal mood.    Labs on Admission: I have personally reviewed following labs and imaging  studies  CBC: Recent Labs  Lab 07/30/21 1418  WBC 9.2  NEUTROABS 6.8  HGB 11.5*  HCT 35.9*  MCV 100.3*  PLT 251   Basic Metabolic Panel: Recent Labs  Lab 07/30/21 1418  NA 135  K 5.4*  CL 103  CO2 23  GLUCOSE 159*  BUN 40*  CREATININE 2.04*  CALCIUM 8.9   GFR: Estimated Creatinine Clearance: 30.3 mL/min (A) (by C-G formula based on SCr of 2.04 mg/dL (H)). Liver Function Tests: Recent Labs  Lab 07/30/21 1418  AST 17  ALT 15  ALKPHOS 39  BILITOT 0.6  PROT 6.4*  ALBUMIN 3.2*   Recent Labs  Lab 07/30/21 1418  LIPASE 30   No results for input(s): AMMONIA in the last 168 hours. Coagulation Profile: No results for input(s): INR, PROTIME in the last 168 hours. Cardiac Enzymes: No results for input(s): CKTOTAL, CKMB, CKMBINDEX, TROPONINI in the last 168 hours. BNP (last 3 results) No results for input(s): PROBNP in the last 8760 hours. HbA1C: No results for input(s): HGBA1C in the last 72 hours. CBG: No results for input(s): GLUCAP in the last 168 hours. Lipid Profile: No results for input(s): CHOL, HDL, LDLCALC, TRIG, CHOLHDL, LDLDIRECT in the last 72 hours. Thyroid Function Tests: No results for input(s): TSH, T4TOTAL, FREET4, T3FREE, THYROIDAB in the last 72 hours. Anemia Panel: No results for input(s): VITAMINB12, FOLATE, FERRITIN, TIBC, IRON, RETICCTPCT in the last 72 hours. Urine analysis:    Component Value Date/Time   COLORURINE YELLOW 07/30/2021 1558   APPEARANCEUR HAZY (A) 07/30/2021 1558   LABSPEC 1.010 07/30/2021 1558   PHURINE 5.0 07/30/2021 1558   GLUCOSEU NEGATIVE 07/30/2021 1558   HGBUR NEGATIVE 07/30/2021 1558   BILIRUBINUR NEGATIVE 07/30/2021 1558   KETONESUR NEGATIVE 07/30/2021 1558   PROTEINUR NEGATIVE 07/30/2021 1558   UROBILINOGEN 0.2 05/19/2009 0733   NITRITE NEGATIVE 07/30/2021 1558   LEUKOCYTESUR SMALL (A) 07/30/2021 1558    Radiological Exams on Admission: CT Renal Stone Study  Result Date: 07/30/2021 CLINICAL DATA:   Bilateral flank pain. History of kidney stone surgery. EXAM: CT ABDOMEN AND PELVIS WITHOUT CONTRAST TECHNIQUE: Multidetector CT imaging of the abdomen and pelvis was performed following the standard protocol without IV contrast. COMPARISON:  12/13/2020 FINDINGS: Lower chest: Small, right greater than left, pleural effusions. Mild dependent subsegmental atelectasis in the lower lobes. Hepatobiliary: No focal liver abnormality is seen. Status post cholecystectomy. No biliary dilatation. Pancreas:  Unremarkable. No pancreatic ductal dilatation or surrounding inflammatory changes. Spleen: Normal in size without focal abnormality. Adrenals/Urinary Tract: No adrenal masses. Bilateral renal cortical thinning. 4.8 cm, medial upper pole right renal cyst. Exophytic lateral right kidney upper pole cyst. Small hyperattenuating focus along the wall of the medial upper pole cyst, 8 mm in size. These findings are stable. No other renal masses. Bilateral intrarenal stones, similar to the prior CT. No hydronephrosis. Ureters are normal in course and in caliber. No ureteral stones. Normal bladder. Stomach/Bowel: Subtle inflammatory type stranding in the fat lateral to the proximal descending colon. This is new from the prior CT. This portion of the colon is not well distended. No convincing wall thickening. Remainder of the colon is unremarkable. Normal stomach and small bowel. Thick rim calcified mass abuts the 3rd to 4th portion of the duodenum, unchanged, measuring 3.2 cm. Vascular/Lymphatic: Aortic atherosclerosis. No aneurysm. No enlarged lymph nodes. Reproductive: Unremarkable. Other: No abdominal wall hernia or abnormality. No abdominopelvic ascites. Musculoskeletal: Air extends along the superior margin of the T11 vertebra, just below the upper endplate, new compared to the prior CT. There is ill-defined increased attenuation in the retroperitoneal fat anterior to this vertebra and posterior to the diaphragmatic or a. this may  reflect a recent fracture or could be infectious/inflammatory in etiology. No other evidence of a fracture.  No suspicious bone lesion. IMPRESSION: 1. No evidence of a ureteral stone or obstructive uropathy. 2. Bilateral nonobstructing intrarenal stones. Diffuse renal cortical thinning consistent with medical renal disease. These findings are stable. 3. Questionable minimal recent fracture of the upper endplate of T11 versus inflammation/infection at this level reflected by increased attenuation in the adjacent fat. These findings could be further assessed and characterized with MRI without and with contrast. 4. Aortic atherosclerosis. 5. Stable, 3.2 cm mass adjacent to the duodenum with a thick rim of peripheral calcification. Etiology of this is unclear. It does not appear to arise from a vessel although an aneurysm remains possible. Electronically Signed   By: Amie Portland M.D.   On: 07/30/2021 15:00    EKG: To be done in the ED.  Assessment/Plan Active Problems:   Essential hypertension   Type 2 diabetes mellitus without complication (HCC)   Acute renal failure superimposed on stage 3a chronic kidney disease (HCC)   AAA (abdominal aortic aneurysm) without rupture (HCC)   Permanent atrial fibrillation (HCC)   Closed T11 fracture (HCC)   Dehydration    Back pain /fall/T11 fracture/infection: Likely fracture secondary to fall.  We will proceed with MRI with contrast to rule out any infection or any other inflammatory etiologies.  To be assessed by PT OT.  Nausea/vomiting/dehydration/AKI on CKD stage IIIa: All related to each other.  Will start on normal saline at 100 cc/h.  Provide supportive care, antiemetic medications.  Avoid nephrotoxic agents.  Hold lisinopril.  Hypertension: Takes lisinopril at home.  Hold that due to AKI.  In the meantime, as needed hydralazine.  Type 2 diabetes mellitus: Takes metformin at home.  Hold that due to AKI.  Start on SSI.  Check hemoglobin A1c.  Permanent  atrial fibrillation: Controlled.  Resume home dose of Eliquis and carvedilol.  HLD: resume statin.  DVT prophylaxis: apixaban (ELIQUIS) tablet 2.5 mg Start: 07/30/21 2200 Code Status: DNR Family Communication: Daughter present at bedside.  Plan of care discussed with her. Disposition Plan: Potential home in next 1 to 2 days Consults called: None   Status is: Observation  The patient remains OBS appropriate  and will d/c before 2 midnights.  Dispo: The patient is from: Home              Anticipated d/c is to: Home              Patient currently is not medically stable to d/c.   Difficult to place patient No    Hughie Closs MD [facility]  07/30/2021, 5:25 PM  To contact the attending provider between 7A-7P or the covering provider during after hours 7P-7A, please log into the web site [email or web address]

## 2021-07-30 NOTE — ED Triage Notes (Signed)
Pt arrives via [facility] from home for evaluation of L sided flank pain. Pt states he has passed "about ten" kidney stones through a strainer in the past week. Denies hematuria, dysuria, and fever. Endorses nausea without vomiting.

## 2021-07-31 ENCOUNTER — Observation Stay (HOSPITAL_COMMUNITY): Payer: No Typology Code available for payment source

## 2021-07-31 DIAGNOSIS — N1831 Chronic kidney disease, stage 3a: Secondary | ICD-10-CM

## 2021-07-31 DIAGNOSIS — R000 Tachycardia, unspecified: Secondary | ICD-10-CM

## 2021-07-31 DIAGNOSIS — N179 Acute kidney failure, unspecified: Secondary | ICD-10-CM | POA: Diagnosis not present

## 2021-07-31 DIAGNOSIS — U071 COVID-19: Secondary | ICD-10-CM | POA: Diagnosis not present

## 2021-07-31 DIAGNOSIS — E119 Type 2 diabetes mellitus without complications: Secondary | ICD-10-CM

## 2021-07-31 DIAGNOSIS — I472 Ventricular tachycardia: Secondary | ICD-10-CM

## 2021-07-31 DIAGNOSIS — S22088A Other fracture of T11-T12 vertebra, initial encounter for closed fracture: Secondary | ICD-10-CM | POA: Diagnosis not present

## 2021-07-31 DIAGNOSIS — I4821 Permanent atrial fibrillation: Secondary | ICD-10-CM | POA: Diagnosis not present

## 2021-07-31 DIAGNOSIS — E860 Dehydration: Secondary | ICD-10-CM

## 2021-07-31 LAB — CBC
HCT: 30.3 % — ABNORMAL LOW (ref 39.0–52.0)
Hemoglobin: 9.8 g/dL — ABNORMAL LOW (ref 13.0–17.0)
MCH: 32.5 pg (ref 26.0–34.0)
MCHC: 32.3 g/dL (ref 30.0–36.0)
MCV: 100.3 fL — ABNORMAL HIGH (ref 80.0–100.0)
Platelets: 226 10*3/uL (ref 150–400)
RBC: 3.02 MIL/uL — ABNORMAL LOW (ref 4.22–5.81)
RDW: 12.8 % (ref 11.5–15.5)
WBC: 8.7 10*3/uL (ref 4.0–10.5)
nRBC: 0 % (ref 0.0–0.2)

## 2021-07-31 LAB — GLUCOSE, CAPILLARY
Glucose-Capillary: 174 mg/dL — ABNORMAL HIGH (ref 70–99)
Glucose-Capillary: 161 mg/dL — ABNORMAL HIGH (ref 70–99)

## 2021-07-31 LAB — BASIC METABOLIC PANEL
Anion gap: 6 (ref 5–15)
BUN: 36 mg/dL — ABNORMAL HIGH (ref 8–23)
CO2: 24 mmol/L (ref 22–32)
Calcium: 8.5 mg/dL — ABNORMAL LOW (ref 8.9–10.3)
Chloride: 109 mmol/L (ref 98–111)
Creatinine, Ser: 1.87 mg/dL — ABNORMAL HIGH (ref 0.61–1.24)
GFR, Estimated: 34 mL/min — ABNORMAL LOW (ref >60)
Glucose, Bld: 102 mg/dL — ABNORMAL HIGH (ref 70–99)
Potassium: 5.7 mmol/L — ABNORMAL HIGH (ref 3.5–5.1)
Sodium: 139 mmol/L (ref 135–145)

## 2021-07-31 LAB — ECHOCARDIOGRAM COMPLETE
Area-P 1/2: 4.1 cm2
Height: 72 in
S' Lateral: 3.3 cm
Weight: 3686.09 oz

## 2021-07-31 LAB — RENAL FUNCTION PANEL
Albumin: 2.7 g/dL — ABNORMAL LOW (ref 3.5–5.0)
Anion gap: 6 (ref 5–15)
BUN: 35 mg/dL — ABNORMAL HIGH (ref 8–23)
CO2: 24 mmol/L (ref 22–32)
Calcium: 8.2 mg/dL — ABNORMAL LOW (ref 8.9–10.3)
Chloride: 105 mmol/L (ref 98–111)
Creatinine, Ser: 1.78 mg/dL — ABNORMAL HIGH (ref 0.61–1.24)
GFR, Estimated: 36 mL/min — ABNORMAL LOW (ref >60)
Glucose, Bld: 95 mg/dL (ref 70–99)
Phosphorus: 3 mg/dL (ref 2.5–4.6)
Potassium: 5.6 mmol/L — ABNORMAL HIGH (ref 3.5–5.1)
Sodium: 135 mmol/L (ref 135–145)

## 2021-07-31 LAB — FERRITIN: Ferritin: 110 ng/mL (ref 24–336)

## 2021-07-31 LAB — MAGNESIUM: Magnesium: 1.8 mg/dL (ref 1.7–2.4)

## 2021-07-31 LAB — FOLATE: Folate: 51.2 ng/mL (ref >5.9)

## 2021-07-31 LAB — IRON AND TIBC
Iron: 61 ug/dL (ref 45–182)
Saturation Ratios: 29 % (ref 17.9–39.5)
TIBC: 212 ug/dL — ABNORMAL LOW (ref 250–450)
UIBC: 151 ug/dL

## 2021-07-31 LAB — VITAMIN B12: Vitamin B-12: 176 pg/mL — ABNORMAL LOW (ref 180–914)

## 2021-07-31 LAB — TSH: TSH: 1.155 u[IU]/mL (ref 0.350–4.500)

## 2021-07-31 MED ORDER — GABAPENTIN 300 MG PO CAPS
300.0000 mg | ORAL_CAPSULE | Freq: Every day | ORAL | Status: DC
  Administered 2021-07-31 – 2021-08-01 (×2): 300 mg via ORAL
  Filled 2021-07-31 (×2): qty 1

## 2021-07-31 MED ORDER — ACETAMINOPHEN 500 MG PO TABS
500.0000 mg | ORAL_TABLET | Freq: Three times a day (TID) | ORAL | Status: DC
  Administered 2021-07-31 – 2021-08-02 (×7): 500 mg via ORAL
  Filled 2021-07-31 (×7): qty 1

## 2021-07-31 MED ORDER — SORBITOL 70 % SOLN: 30.0000 mL | Freq: Every day | Status: DC | PRN

## 2021-07-31 MED ORDER — ZOLPIDEM TARTRATE 5 MG PO TABS
5.0000 mg | ORAL_TABLET | Freq: Every day | ORAL | Status: DC
  Administered 2021-07-31 – 2021-08-01 (×2): 5 mg via ORAL
  Filled 2021-07-31 (×2): qty 1

## 2021-07-31 MED ORDER — APIXABAN 2.5 MG PO TABS: 2.5000 mg | ORAL_TABLET | Freq: Two times a day (BID) | ORAL | Status: DC

## 2021-07-31 MED ORDER — SENNOSIDES-DOCUSATE SODIUM 8.6-50 MG PO TABS
1.0000 | ORAL_TABLET | Freq: Two times a day (BID) | ORAL | Status: DC
  Administered 2021-07-31 – 2021-08-02 (×5): 1 via ORAL
  Filled 2021-07-31 (×5): qty 1

## 2021-07-31 MED ORDER — SODIUM ZIRCONIUM CYCLOSILICATE 10 G PO PACK
10.0000 g | PACK | Freq: Two times a day (BID) | ORAL | Status: DC
  Administered 2021-07-31: 10 g via ORAL
  Filled 2021-07-31: qty 1

## 2021-07-31 MED ORDER — VITAMIN D 25 MCG (1000 UNIT) PO TABS
2000.0000 [IU] | ORAL_TABLET | Freq: Every day | ORAL | Status: DC
  Administered 2021-07-31 – 2021-08-02 (×3): 2000 [IU] via ORAL
  Filled 2021-07-31 (×3): qty 2

## 2021-07-31 MED ORDER — MUPIROCIN CALCIUM 2 % EX CREA
TOPICAL_CREAM | Freq: Two times a day (BID) | CUTANEOUS | Status: DC
  Administered 2021-08-01: 1 via TOPICAL
  Filled 2021-07-31: qty 15

## 2021-07-31 MED ORDER — TAMSULOSIN HCL 0.4 MG PO CAPS
0.4000 mg | ORAL_CAPSULE | Freq: Every day | ORAL | Status: DC
  Administered 2021-07-31 – 2021-08-02 (×3): 0.4 mg via ORAL
  Filled 2021-07-31 (×3): qty 1

## 2021-07-31 MED ORDER — SODIUM POLYSTYRENE SULFONATE 15 GM/60ML PO SUSP
45.0000 g | Freq: Once | ORAL | Status: AC
  Administered 2021-07-31: 45 g via ORAL
  Filled 2021-07-31: qty 180

## 2021-07-31 MED ORDER — CYANOCOBALAMIN 1000 MCG/ML IJ SOLN
1000.0000 ug | Freq: Every day | INTRAMUSCULAR | Status: DC
  Administered 2021-07-31: 1000 ug via SUBCUTANEOUS
  Filled 2021-07-31 (×5): qty 1

## 2021-07-31 MED ORDER — CARVEDILOL 6.25 MG PO TABS
6.2500 mg | ORAL_TABLET | Freq: Two times a day (BID) | ORAL | Status: DC
  Administered 2021-07-31 – 2021-08-02 (×5): 6.25 mg via ORAL
  Filled 2021-07-31 (×5): qty 1

## 2021-07-31 MED ORDER — TRAMADOL HCL 50 MG PO TABS
50.0000 mg | ORAL_TABLET | Freq: Four times a day (QID) | ORAL | Status: DC | PRN
  Administered 2021-08-01 – 2021-08-02 (×3): 50 mg via ORAL
  Filled 2021-07-31 (×3): qty 1

## 2021-07-31 MED ORDER — LATANOPROST 0.005 % OP SOLN
1.0000 [drp] | Freq: Every day | OPHTHALMIC | Status: DC
  Administered 2021-07-31 – 2021-08-01 (×2): 1 [drp] via OPHTHALMIC
  Filled 2021-07-31: qty 2.5

## 2021-07-31 NOTE — Consult Note (Signed)
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 Previously followed in the community by Podiatric Medicine. Daughter reports using both Santyl (collagenase) and mupirocin. I will continue with mupirocin while in house.  Patient may return to use with the dual therapy upon discharge. Wound type:arterial insufficiency Pressure Injury POA: N/A Measurement:To be obtained by Bedside RN today Wound bed: yellow, punctate Drainage (amount, consistency, odor) small, serous to light yellow Periwound: intact, dependent rubor, hairless lower extremities, disfigured nails Dressing procedure/placement/frequency: As noted above, I will provide guidance for Nursing via the Orders for use of mupirocin ointment twice daily x 14 days. A silicone foam dressing and pressure redistribution heel boots are provided for PI prevention.  WOC nursing team will not follow, but will remain available to this patient, the nursing and medical teams.  Please re-consult if needed. Thanks, Ladona Mow, MSN, RN, GNP, [facility]  Pager# [phone]

## 2021-07-31 NOTE — Progress Notes (Signed)
PROGRESS NOTE    RUSHAWN CAPSHAW  SWN:[id number] DOB: 17-Jun-1930 DOA: 07/30/2021 PCP: [facility]   Chief Complaint  Patient presents with   Flank Pain    Brief Narrative:  Pleasant 87 year old gentleman history of permanent A. fib, CKD stage IIIa, recurrent kidney stones, AAA, type 2 diabetes, hypertension, hyperlipidemia, HOH presenting with daughter to the ED with back pain, nausea vomiting for weakness.  Patient noted to have been passing several kidney stones which are being incised.  Daughter.  Patient with complaints of constipation.  Patient seen in the ED COVID-19 PCR positive.  CT renal stone did not show obstructive ureteral stone or obstructive uropathy, had bilateral nonobstructing intrarenal stones, question of T11 fracture versus inflammation/infection and 3.2 cm mass adjacent to the duodenum stable.  Patient noted to be AKI on CKD, dehydration hospitalist were called to admit patient.  Patient placed on IV fluids.  Patient noted to have a 31 beat run of wide-complex tachycardia.  Cardiology consultation pending.   Assessment & Plan:   Principal Problem:   Acute renal failure superimposed on stage 3a chronic kidney disease (HCC) Active Problems:   Wide-complex tachycardia (HCC)   Essential hypertension   Type 2 diabetes mellitus without complication (HCC)   AAA (abdominal aortic aneurysm) without rupture (HCC)   COVID-19 virus infection   Permanent atrial fibrillation (HCC)   Closed T11 fracture (HCC)   Dehydration   #1 acute kidney injury on chronic kidney disease stage IIIa -Likely secondary to prerenal azotemia in the setting of ACE inhibitor. -Patient presented with nausea vomiting, decreased oral intake.  Patient also with complaints of constipation. -Creatinine noted at 2.04 on admission with prior creatinine of 1.25 on 05/09/2020. -Urinalysis nitrite negative, small leukocytes, 0-5 WBCs. -Patient placed on IV fluids with improving renal function  creatinine down to 1.87. -Continue to hold ACE inhibitor. -IV fluids. -Avoid nephrotoxins. -Supportive care.  2.  Wide-complex tachycardia/nonsustained V. tach -Patient noted to have a 31 beat run of WCT/NSVT. -Patient asymptomatic. -Check a basic metabolic profile, magnesium, TSH, 2D echo. -Increase home dose of Coreg to 6.25 mg p.o. twice daily. -Consult with cardiology for further evaluation and management.  3.  Permanent A. fib -Increase home regimen Coreg to twice daily secondary to problem #2. -Eliquis for anticoagulation.  4.  Dehydration -IV fluids.  5.  Hyperkalemia -Likely secondary to acute kidney injury -Kayexalate 45 g p.o. x1. -Repeat labs 2 to 3 hours post Kayexalate.  6.  Anemia of chronic disease/vitamin B12 deficiency -Patient with anemia with no overt bleeding. -Likely dilutional component. -Anemia panel with iron of 61, TIBC of 212, ferritin of 110, vitamin B12 of 176. -Placed on vitamin B12 supplementation. -Follow H&H. -Transfusion threshold hemoglobin < 8.  7.  COVID-19 infection -Patient not hypoxic with sats of 95% on room air. -Hold off on steroids or remdesivir. -Supportive care.  8.  Diabetes mellitus type 2 -Hemoglobin A1c 6.4 (07/30/2021). -CBG 174 this morning. -Hold oral hypoglycemic agents. -SSI.  9.  Hypertension -Continue to hold ACE inhibitor due to AKI.  #10: Hyperlipidemia -Statin.   DVT prophylaxis: Eliquis Code Status: DNR Family Communication:  Disposition:   Status is: Observation  The patient remains OBS appropriate and will d/c before 2 midnights.  Dispo: The patient is from: Home              Anticipated d/c is to: Home              Patient currently is not medically stable  to d/c.   Difficult to place patient No       Consultants:  Cardiology pending  Procedures:  CT renal stone protocol 07/30/2021 MRI T-spine 07/30/2021   Antimicrobials:  None   Subjective: Sitting up in bed.  Denies any  chest pain.  No shortness of breath.  No abdominal pain.  Patient noted to have bowel movement after Kayexalate given this morning.  Tolerated diet.  Daughter at bedside. Was notified by RN that patient has a 31 beat 1 run of wide-complex tachycardia and at that time noted to be working with therapy.  Some complaints of back pain.  Objective: Vitals:   07/31/21 0200 07/31/21 0643 07/31/21 0821 07/31/21 1216  BP: 104/65 112/64 112/80 137/77  Pulse: 93 90  91  Resp: 18 18  14   Temp: 98 F (36.7 C) 98.1 F (36.7 C)  (!) 97.5 F (36.4 C)  TempSrc: Oral Oral  Oral  SpO2: 97% 95%    Weight:      Height:        Intake/Output Summary (Last 24 hours) at 07/31/2021 1458 Last data filed at 07/31/2021 0955 Gross per 24 hour  Intake 582.07 ml  Output 300 ml  Net 282.07 ml   Filed Weights   07/30/21 1355 07/30/21 2205  Weight: 97.5 kg 104.5 kg    Examination:  General exam: Appears calm and comfortable  Respiratory system: Clear to auscultation. Respiratory effort normal. Cardiovascular system: Irregularly irregular.  No JVD.  No murmurs rubs or gallops.  No lower extremity edema.   Gastrointestinal system: Abdomen is nondistended, soft and nontender. No organomegaly or masses felt. Normal bowel sounds heard. Central nervous system: Alert and oriented. No focal neurological deficits. Extremities: Symmetric 5 x 5 power. Skin: No rashes, lesions or ulcers Psychiatry: Judgement and insight appear fair. Mood & affect appropriate.     Data Reviewed: I have personally reviewed following labs and imaging studies  CBC: Recent Labs  Lab 07/30/21 1418 07/31/21 0332  WBC 9.2 8.7  NEUTROABS 6.8  --   HGB 11.5* 9.8*  HCT 35.9* 30.3*  MCV 100.3* 100.3*  PLT 251 226    Basic Metabolic Panel: Recent Labs  Lab 07/30/21 1418 07/31/21 0332  NA 135 139  K 5.4* 5.7*  CL 103 109  CO2 23 24  GLUCOSE 159* 102*  BUN 40* 36*  CREATININE 2.04* 1.87*  CALCIUM 8.9 8.5*  MG 2.0  --      GFR: Estimated Creatinine Clearance: 34.1 mL/min (A) (by C-G formula based on SCr of 1.87 mg/dL (H)).  Liver Function Tests: Recent Labs  Lab 07/30/21 1418  AST 17  ALT 15  ALKPHOS 39  BILITOT 0.6  PROT 6.4*  ALBUMIN 3.2*    CBG: Recent Labs  Lab 07/30/21 2137 07/30/21 2214 07/31/21 1121  GLUCAP 144* 141* 174*     Recent Results (from the past 240 hour(s))  Resp Panel by RT-PCR (Flu A&B, Covid) Nasopharyngeal Swab     Status: Abnormal   Collection Time: 07/30/21  5:20 PM   Specimen: Nasopharyngeal Swab; Nasopharyngeal(NP) swabs in vial transport medium  Result Value Ref Range Status   SARS Coronavirus 2 by RT PCR POSITIVE (A) NEGATIVE Final    Comment: CRITICAL POSITIVE DAT CALLED TO, READ BACK AND VERIFIED WITH: 08/01/21, RN (NOTE) SARS-CoV-2 target nucleic acids are DETECTED.  The SARS-CoV-2 RNA is generally detectable in upper respiratory specimens during the acute phase of infection. Positive results are indicative of the presence of  the identified virus, but do not rule out bacterial infection or co-infection with other pathogens not detected by the test. Clinical correlation with patient history and other diagnostic information is necessary to determine patient infection status. The expected result is Negative.  Fact Sheet for Patients: BloggerCourse.comhttps://www.fda.gov/media/152166/download  Fact Sheet for Healthcare Providers: SeriousBroker.ithttps://www.fda.gov/media/152162/download  This test is not yet approved or cleared by the [place] FDA and  has been authorized for detection and/or diagnosis of SARS-CoV-2 by FDA under an Emergency Use Authorization (EUA).  This EUA will remain in effect (meaning this test can be used)  for the duration of  the COVID-19 declaration under Section 564(b)(1) of the Act, 21 U.S.C. section 360bbb-3(b)(1), unless the authorization is terminated or revoked sooner.     Influenza A by PCR NEGATIVE NEGATIVE Final   Influenza B by PCR  NEGATIVE NEGATIVE Final    Comment: (NOTE) The Xpert Xpress SARS-CoV-2/FLU/RSV plus assay is intended as an aid in the diagnosis of influenza from Nasopharyngeal swab specimens and should not be used as a sole basis for treatment. Nasal washings and aspirates are unacceptable for Xpert Xpress SARS-CoV-2/FLU/RSV testing.  Fact Sheet for Patients: BloggerCourse.comhttps://www.fda.gov/media/152166/download  Fact Sheet for Healthcare Providers: SeriousBroker.ithttps://www.fda.gov/media/152162/download  This test is not yet approved or cleared by the [place] FDA and has been authorized for detection and/or diagnosis of SARS-CoV-2 by FDA under an Emergency Use Authorization (EUA). This EUA will remain in effect (meaning this test can be used) for the duration of the COVID-19 declaration under Section 564(b)(1) of the Act, 21 U.S.C. section 360bbb-3(b)(1), unless the authorization is terminated or revoked.  Performed at [facility], [address] [place], [place] [address]          Radiology Studies: MR THORACIC SPINE W WO CONTRAST  Result Date: 07/30/2021 CLINICAL DATA:  Fall 1 week ago EXAM: MRI THORACIC WITHOUT AND WITH CONTRAST TECHNIQUE: Multiplanar and multiecho pulse sequences of the thoracic spine were obtained without and with intravenous contrast. CONTRAST:  10mL GADAVIST GADOBUTROL 1 MMOL/ML IV SOLN COMPARISON:  None. FINDINGS: Alignment:  Physiologic. Vertebrae: Fracture at T11 extends through the posterior and superior walls. No height loss. On the concomitant CT, there is gas within the fracture cleft. Cord:  Normal Paraspinal and other soft tissues: Prevertebral soft tissue edema with enhancement at the T10-11 levels. Disc levels: There are multiple small central disc protrusions with narrowing of the ventral thecal sac at T7-8. There is no spinal canal stenosis. No nerve root impingement. IMPRESSION: 1. T11 fracture extending through the posterior and superior walls, with  associated prevertebral soft tissue edema and enhancement. No height loss. The appearance is consistent with a subacute fracture and early formation of osteonecrotic cleft. The presence of gas at the fracture site argues against infection. 2. Multiple small central disc protrusions with narrowing of the ventral thecal sac at T7-8. No spinal canal stenosis or nerve root impingement. Electronically Signed   By: Deatra RobinsonKevin  Herman M.D.   On: 07/30/2021 19:09   CT Renal Stone Study  Result Date: 07/30/2021 CLINICAL DATA:  Bilateral flank pain. History of kidney stone surgery. EXAM: CT ABDOMEN AND PELVIS WITHOUT CONTRAST TECHNIQUE: Multidetector CT imaging of the abdomen and pelvis was performed following the standard protocol without IV contrast. COMPARISON:  12/13/2020 FINDINGS: Lower chest: Small, right greater than left, pleural effusions. Mild dependent subsegmental atelectasis in the lower lobes. Hepatobiliary: No focal liver abnormality is seen. Status post cholecystectomy. No biliary dilatation. Pancreas: Unremarkable. No pancreatic ductal dilatation  or surrounding inflammatory changes. Spleen: Normal in size without focal abnormality. Adrenals/Urinary Tract: No adrenal masses. Bilateral renal cortical thinning. 4.8 cm, medial upper pole right renal cyst. Exophytic lateral right kidney upper pole cyst. Small hyperattenuating focus along the wall of the medial upper pole cyst, 8 mm in size. These findings are stable. No other renal masses. Bilateral intrarenal stones, similar to the prior CT. No hydronephrosis. Ureters are normal in course and in caliber. No ureteral stones. Normal bladder. Stomach/Bowel: Subtle inflammatory type stranding in the fat lateral to the proximal descending colon. This is new from the prior CT. This portion of the colon is not well distended. No convincing wall thickening. Remainder of the colon is unremarkable. Normal stomach and small bowel. Thick rim calcified mass abuts the 3rd to  4th portion of the duodenum, unchanged, measuring 3.2 cm. Vascular/Lymphatic: Aortic atherosclerosis. No aneurysm. No enlarged lymph nodes. Reproductive: Unremarkable. Other: No abdominal wall hernia or abnormality. No abdominopelvic ascites. Musculoskeletal: Air extends along the superior margin of the T11 vertebra, just below the upper endplate, new compared to the prior CT. There is ill-defined increased attenuation in the retroperitoneal fat anterior to this vertebra and posterior to the diaphragmatic or a. this may reflect a recent fracture or could be infectious/inflammatory in etiology. No other evidence of a fracture.  No suspicious bone lesion. IMPRESSION: 1. No evidence of a ureteral stone or obstructive uropathy. 2. Bilateral nonobstructing intrarenal stones. Diffuse renal cortical thinning consistent with medical renal disease. These findings are stable. 3. Questionable minimal recent fracture of the upper endplate of T11 versus inflammation/infection at this level reflected by increased attenuation in the adjacent fat. These findings could be further assessed and characterized with MRI without and with contrast. 4. Aortic atherosclerosis. 5. Stable, 3.2 cm mass adjacent to the duodenum with a thick rim of peripheral calcification. Etiology of this is unclear. It does not appear to arise from a vessel although an aneurysm remains possible. Electronically Signed   By: Amie Portland M.D.   On: 07/30/2021 15:00        Scheduled Meds:  acetaminophen  500 mg Oral TID   apixaban  2.5 mg Oral BID   carvedilol  6.25 mg Oral BID WC   cyanocobalamin  1,000 mcg Subcutaneous Daily   fluticasone  1 spray Each Nare Daily   insulin aspart  0-5 Units Subcutaneous QHS   insulin aspart  0-9 Units Subcutaneous TID WC   meclizine  25 mg Oral BID   mupirocin cream   Topical BID   senna-docusate  1 tablet Oral BID   Continuous Infusions:  sodium chloride 100 mL/hr at 07/31/21 0654     LOS: 0 days     Time spent: 45 minutes    Ramiro Harvest, MD [facility]   To contact the attending provider between 7A-7P or the covering provider during after hours 7P-7A, please log into the web site [email or web address] and access using universal [place] password for that web site. If you do not have the password, please call the hospital operator.  07/31/2021, 2:58 PM

## 2021-07-31 NOTE — Progress Notes (Signed)
Cardiac monitoring notified RN at 1159 of a 31 beat of wide QRS/V-Tach. Patient asymptomatic, working with physical therapy.  MD notified. BP 137/77, RR 14, PR 91.

## 2021-07-31 NOTE — Consult Note (Addendum)
Cardiology Consultation:   Patient ID: Charles Richard MRN: [id number]; DOB: 01/02/1930  Admit date: 07/30/2021 Date of Consult: 07/31/2021  PCP:  [facility]   [facility] Providers Cardiologist:  None        Patient Profile:   Charles Richard is a 87 y.o. male with a hx of permanent atrial fibrillation, CKD stage III, T2DM, hypertension, hyperlipidemia who presents with nausea vomiting and AKI who is being seen 07/31/2021 for the evaluation of NSVT at the request of Dr. Janee Morn.  History of Present Illness:   Charles Richard with a hx of permanent atrial fibrillation, CKD stage III, T2DM, hypertension, hyperlipidemia who presents with nausea vomiting and AKI who is being seen 07/31/2021 for the evaluation of NSVT  He had a fall last week and since that time has been having significant back/flank pain.  Has passed several kidney stones.  Has also been having significant nausea/vomiting and not eating much due to this.  Has been constipated, no bowel movements x7 days.  Given the symptoms, he was taken to the ED on 8/20.  In the ED, vital signs notable for BP 111/67, SPO2 100%, pulse 99.  Labs notable for creatinine 2.0 (up from 1.25 on 05/09/2020), potassium 5.4, magnesium 2.0, hemoglobin 11.5, platelets 251, WBC 9.2, normal lipase, unremarkable UA.  CT renal stone shows bilateral nonobstructing intrarenal stones.  There is also question of T11 fracture versus inflammation/infection.  Also with stable 3.2 cm mass adjacent to duodenum.  MRI spine showed T11 fracture.  Repeat labs this morning showed worsening potassium to 5.7.  He was given Kayexalate today.  This afternoon he had a 31 beat run of NSVT.  Denies any chest pain, shortness of breath, palpitations, or lightheadedness/syncope.  Echocardiogram today shows EF 55 to 60%, moderate LVH, normal RV function, no significant valvular disease.   Past Medical History:  Diagnosis Date   AAA (abdominal aortic aneurysm) without  rupture (HCC) 06/03/2017   Arthritis    Atrial fibrillation with rapid ventricular response (HCC) 04/2017   New diagnosis during admission for urosepsis secondary to left nephrolithiasis/ureteral stone with hydronephrosis   COVID-19    04/2020   Diabetes mellitus without complication (HCC)    Dysrhythmia    a fib   Essential hypertension    Hard of hearing    History of CVA (cerebrovascular accident) without residual deficits 05/2009   Admitted with expressive aphasia & confusion following a neurocardiogenic syncope event (related to dehydration, AKD & BB related hypotension/bradycardia)   History of kidney stones    Hyperlipidemia    Left nephrolithiasis 04/2017   s/p L Ureteral stent for L hydronephrosis & UTI/Urosepsis.     Myocardial infarction (HCC)    Sepsis (HCC)    Skin ulcers of foot, bilateral (HCC)    Stroke (HCC)    mild stroke no residuals    Past Surgical History:  Procedure Laterality Date   CHOLECYSTECTOMY     CYSTOSCOPY W/ URETERAL STENT PLACEMENT Left 05/09/2017   Procedure: CYSTOSCOPY WITH RETROGRADE PYELOGRAM/URETERAL STENT PLACEMENT;  Surgeon: Jerilee Field, MD;  Location: [facility];  Service: Urology;  Laterality: Left;   CYSTOSCOPY WITH RETROGRADE PYELOGRAM, URETEROSCOPY AND STENT PLACEMENT Left 06/08/2017   Procedure: CYSTOSCOPY WITH LEFT RETROGRADE PYELOGRAM, URETEROSCOPY HOLMIUM LASER LITHOTRIPSY BASKET EXTRACTION  AND STENT EXCHANGE;  Surgeon: Jerilee Field, MD;  Location: [facility];  Service: Urology;  Laterality: Left;   EYE SURGERY     right   HOLMIUM LASER APPLICATION Left 06/08/2017  Procedure: HOLMIUM LASER APPLICATION;  Surgeon: Jerilee Field, MD;  Location: [facility];  Service: Urology;  Laterality: Left;   LITHOTRIPSY     REVERSE SHOULDER ARTHROPLASTY Right 07/20/2017   Procedure: REVERSE RIGHT SHOULDER ARTHROPLASTY;  Surgeon: Yolonda Kida, MD;  Location: [facility];  Service: Orthopedics;  Laterality: Right;  150 mins       Inpatient  Medications: Scheduled Meds:  acetaminophen  500 mg Oral TID   apixaban  2.5 mg Oral BID   carvedilol  6.25 mg Oral BID WC   cyanocobalamin  1,000 mcg Subcutaneous Daily   fluticasone  1 spray Each Nare Daily   insulin aspart  0-5 Units Subcutaneous QHS   insulin aspart  0-9 Units Subcutaneous TID WC   meclizine  25 mg Oral BID   mupirocin cream   Topical BID   senna-docusate  1 tablet Oral BID   Continuous Infusions:  sodium chloride 100 mL/hr at 07/31/21 0654   PRN Meds: acetaminophen **OR** acetaminophen, hydrALAZINE, ondansetron **OR** ondansetron (ZOFRAN) IV, sorbitol, traMADol  Allergies:   No Known Allergies  Social History:   Social History   Socioeconomic History   Marital status: Widowed    Spouse name: Not on file   Number of children: Not on file   Years of education: Not on file   Highest education level: Not on file  Occupational History   Not on file  Tobacco Use   Smoking status: Never   Smokeless tobacco: Never  Vaping Use   Vaping Use: Never used  Substance and Sexual Activity   Alcohol use: No   Drug use: No   Sexual activity: Not on file  Other Topics Concern   Not on file  Social History Narrative   Not on file   Social Determinants of Health   Financial Resource Strain: Not on file  Food Insecurity: Not on file  Transportation Needs: Not on file  Physical Activity: Not on file  Stress: Not on file  Social Connections: Not on file  Intimate Partner Violence: Not on file    Family History:    Family History  Problem Relation Age of Onset   Lung cancer Father        black lung   CAD Neg Hx    Kidney Stones Neg Hx    Aneurysm Neg Hx      ROS:  Please see the history of present illness.   All other ROS reviewed and negative.     Physical Exam/Data:   Vitals:   07/31/21 0200 07/31/21 0643 07/31/21 0821 07/31/21 1216  BP: 104/65 112/64 112/80 137/77  Pulse: 93 90  91  Resp: 18 18  14   Temp: 98 F (36.7 C) 98.1 F (36.7 C)   (!) 97.5 F (36.4 C)  TempSrc: Oral Oral  Oral  SpO2: 97% 95%    Weight:      Height:        Intake/Output Summary (Last 24 hours) at 07/31/2021 1428 Last data filed at 07/31/2021 0955 Gross per 24 hour  Intake 582.07 ml  Output 300 ml  Net 282.07 ml   Last 3 Weights 07/30/2021 07/30/2021 05/05/2020  Weight (lbs) 230 lb 6.1 oz 215 lb 215 lb  Weight (kg) 104.5 kg 97.523 kg 97.523 kg     Body mass index is 31.25 kg/m.  General:  in no acute distress HEENT: normal Neck: no JVD Cardiac:  normal S1, S2; RRR; no murmur  Lungs:  clear to auscultation bilaterally,  no wheezing, rhonchi or rales  Abd: soft, nontender, no hepatomegaly  Ext: no edema Musculoskeletal:  No deformities, BUE and BLE strength normal and equal Skin: warm and dry  Neuro:   no focal abnormalities noted Psych:  Normal affect   EKG:  The EKG was personally reviewed and demonstrates: Atrial fibrillation, PVC, low voltage, rate 87 Telemetry:  Telemetry was personally reviewed and demonstrates: 14-second run of wide-complex tachycardia.  Atrial fibrillation with rates 80s to 110s  Relevant CV Studies: Echo 07/31/21:  1. Left ventricular ejection fraction, by estimation, is 55 to 60%. The  left ventricle has normal function. The left ventricle has no regional  wall motion abnormalities. There is moderate left ventricular hypertrophy.  Left ventricular diastolic  parameters are indeterminate.   2. Right ventricular systolic function is normal. The right ventricular  size is normal. There is normal pulmonary artery systolic pressure. The  estimated right ventricular systolic pressure is 19.6 mmHg.   3. Left atrial size was mildly dilated.   4. The mitral valve is grossly normal. Trivial mitral valve  regurgitation.   5. The aortic valve is tricuspid. Aortic valve regurgitation is not  visualized. Mild aortic valve sclerosis is present, with no evidence of  aortic valve stenosis.   6. The inferior vena cava is  normal in size with greater than 50%  respiratory variability, suggesting right atrial pressure of 3 mmHg.   Laboratory Data:  High Sensitivity Troponin:  No results for input(s): TROPONINIHS in the last 720 hours.   Chemistry Recent Labs  Lab 07/30/21 1418 07/31/21 0332  NA 135 139  K 5.4* 5.7*  CL 103 109  CO2 23 24  GLUCOSE 159* 102*  BUN 40* 36*  CREATININE 2.04* 1.87*  CALCIUM 8.9 8.5*  GFRNONAA 31* 34*  ANIONGAP 9 6    Recent Labs  Lab 07/30/21 1418  PROT 6.4*  ALBUMIN 3.2*  AST 17  ALT 15  ALKPHOS 39  BILITOT 0.6   Hematology Recent Labs  Lab 07/30/21 1418 07/31/21 0332  WBC 9.2 8.7  RBC 3.58* 3.02*  HGB 11.5* 9.8*  HCT 35.9* 30.3*  MCV 100.3* 100.3*  MCH 32.1 32.5  MCHC 32.0 32.3  RDW 13.0 12.8  PLT 251 226   BNPNo results for input(s): BNP, PROBNP in the last 168 hours.  DDimer No results for input(s): DDIMER in the last 168 hours.   Radiology/Studies:  MR THORACIC SPINE W WO CONTRAST  Result Date: 07/30/2021 CLINICAL DATA:  Fall 1 week ago EXAM: MRI THORACIC WITHOUT AND WITH CONTRAST TECHNIQUE: Multiplanar and multiecho pulse sequences of the thoracic spine were obtained without and with intravenous contrast. CONTRAST:  27mL GADAVIST GADOBUTROL 1 MMOL/ML IV SOLN COMPARISON:  None. FINDINGS: Alignment:  Physiologic. Vertebrae: Fracture at T11 extends through the posterior and superior walls. No height loss. On the concomitant CT, there is gas within the fracture cleft. Cord:  Normal Paraspinal and other soft tissues: Prevertebral soft tissue edema with enhancement at the T10-11 levels. Disc levels: There are multiple small central disc protrusions with narrowing of the ventral thecal sac at T7-8. There is no spinal canal stenosis. No nerve root impingement. IMPRESSION: 1. T11 fracture extending through the posterior and superior walls, with associated prevertebral soft tissue edema and enhancement. No height loss. The appearance is consistent with a  subacute fracture and early formation of osteonecrotic cleft. The presence of gas at the fracture site argues against infection. 2. Multiple small central disc protrusions with narrowing of the  ventral thecal sac at T7-8. No spinal canal stenosis or nerve root impingement. Electronically Signed   By: Deatra RobinsonKevin  Herman M.D.   On: 07/30/2021 19:09   CT Renal Stone Study  Result Date: 07/30/2021 CLINICAL DATA:  Bilateral flank pain. History of kidney stone surgery. EXAM: CT ABDOMEN AND PELVIS WITHOUT CONTRAST TECHNIQUE: Multidetector CT imaging of the abdomen and pelvis was performed following the standard protocol without IV contrast. COMPARISON:  12/13/2020 FINDINGS: Lower chest: Small, right greater than left, pleural effusions. Mild dependent subsegmental atelectasis in the lower lobes. Hepatobiliary: No focal liver abnormality is seen. Status post cholecystectomy. No biliary dilatation. Pancreas: Unremarkable. No pancreatic ductal dilatation or surrounding inflammatory changes. Spleen: Normal in size without focal abnormality. Adrenals/Urinary Tract: No adrenal masses. Bilateral renal cortical thinning. 4.8 cm, medial upper pole right renal cyst. Exophytic lateral right kidney upper pole cyst. Small hyperattenuating focus along the wall of the medial upper pole cyst, 8 mm in size. These findings are stable. No other renal masses. Bilateral intrarenal stones, similar to the prior CT. No hydronephrosis. Ureters are normal in course and in caliber. No ureteral stones. Normal bladder. Stomach/Bowel: Subtle inflammatory type stranding in the fat lateral to the proximal descending colon. This is new from the prior CT. This portion of the colon is not well distended. No convincing wall thickening. Remainder of the colon is unremarkable. Normal stomach and small bowel. Thick rim calcified mass abuts the 3rd to 4th portion of the duodenum, unchanged, measuring 3.2 cm. Vascular/Lymphatic: Aortic atherosclerosis. No  aneurysm. No enlarged lymph nodes. Reproductive: Unremarkable. Other: No abdominal wall hernia or abnormality. No abdominopelvic ascites. Musculoskeletal: Air extends along the superior margin of the T11 vertebra, just below the upper endplate, new compared to the prior CT. There is ill-defined increased attenuation in the retroperitoneal fat anterior to this vertebra and posterior to the diaphragmatic or a. this may reflect a recent fracture or could be infectious/inflammatory in etiology. No other evidence of a fracture.  No suspicious bone lesion. IMPRESSION: 1. No evidence of a ureteral stone or obstructive uropathy. 2. Bilateral nonobstructing intrarenal stones. Diffuse renal cortical thinning consistent with medical renal disease. These findings are stable. 3. Questionable minimal recent fracture of the upper endplate of T11 versus inflammation/infection at this level reflected by increased attenuation in the adjacent fat. These findings could be further assessed and characterized with MRI without and with contrast. 4. Aortic atherosclerosis. 5. Stable, 3.2 cm mass adjacent to the duodenum with a thick rim of peripheral calcification. Etiology of this is unclear. It does not appear to arise from a vessel although an aneurysm remains possible. Electronically Signed   By: Amie Portlandavid  Ormond M.D.   On: 07/30/2021 15:00     Assessment and Plan:   NSVT: 14 second run of wide-complex tachycardia this afternoon, concerning for NSVT.  Appears he was asymptomatic. Echocardiogram today shows no structural heart disease.  Suspect due to electrolyte abnormalities.  Potassium 5.7 today, not as high as would typically expect to cause VT, but agree with giving Lokelma to lower potassium.  Replete magnesium for goal >2. Continue carvedilol.  Continue to monitor on telemetry  Permanent atrial fibrillation: Rates appear controlled.  Continue carvedilol.  On Eliquis.  Can switch to heparin drip if planning any intervention for  compression fracture.    For questions or updates, please contact [facility] Please consult [email or web address] for contact info under    Signed, Little Ishikawahristopher L Axcel Horsch, MD  07/31/2021 2:28 PM

## 2021-07-31 NOTE — Evaluation (Signed)
Physical Therapy Evaluation Patient Details Name: Charles Richard MRN: [id number] DOB: February 05, 1930 Today's Date: 07/31/2021   History of Present Illness  Pt is an 87 y.o. male adm  with  AKI on CKD.     PMH: T2DM, HTN, HLD, CVA, AFib, stage III CKD, AAA, and COVID-18 in May 2021 admitted on 07/30/2021 due to fall with incidental finding of Covid-19. Imaging revealed "IMPRESSION:  1. T11 fracture extending through the posterior and superior walls,  with associated prevertebral soft tissue edema and enhancement. No  height loss. The appearance is consistent with a subacute fracture.   2. Multiple small central disc protrusions with narrowing of the  ventral thecal sac at T7-8. No spinal canal stenosis or nerve root  impingement."  Clinical Impression  Pt admitted with above diagnosis.  Per dtr pt weaker than his baseline, recent fall at home prior to admission, overall min assist at this time. Will benefit from HHPT  Pt currently with functional limitations due to the deficits listed below (see PT Problem List). Pt will benefit from skilled PT to increase their independence and safety with mobility to allow discharge to the venue listed below.       Follow Up Recommendations Home health PT;Supervision for mobility/OOB    Equipment Recommendations  None recommended by PT    Recommendations for Other Services       Precautions / Restrictions Precautions Precautions: Fall Precaution Comments: Airborn/Contact Restrictions Weight Bearing Restrictions: No      Mobility  Bed Mobility Overal bed mobility: Needs Assistance Bed Mobility: Supine to Sit;Sit to Supine     Supine to sit: Min assist;HOB elevated Sit to supine: Min guard   General bed mobility comments: assist to elevate trunk, min/guard to safely elevate LEs on to bed, incr time    Transfers Overall transfer level: Needs assistance Equipment used: Rolling walker (2 wheeled) Transfers: Sit to/from [employer] Sit to Stand: Min assist Stand pivot transfers: Min assist       General transfer comment: assist to rise from bed at  lowest setting, cues to power up, assist to steady for SPT d/t bowel urgency  Ambulation/Gait             General Gait Details: pivotal steps only  Stairs            Wheelchair Mobility    Modified Rankin (Stroke Patients Only)       Balance Overall balance assessment: Needs assistance   Sitting balance-Leahy Scale: Good Sitting balance - Comments: Good static. Fair dynamic.   Standing balance support: Bilateral upper extremity supported Standing balance-Leahy Scale: Poor Standing balance comment: able to briefly release walker with one hand during static stand but required Min As and unable to perform functional tasks in standing. (requires assist of PT and pt dtr to don/doff depends)                             Pertinent Vitals/Pain Pain Assessment: No/denies pain    Home Living Family/patient expects to be discharged to:: Private residence Living Arrangements: Children (dtr and SIL) Available Help at Discharge: Family;Available 24 hours/day Type of Home: House Home Access: Ramped entrance     Home Layout: One level Home Equipment: Transport chair;Cane - single point;Shower seat;Walker - 2 wheels;Bedside commode;Hand held shower head;Grab bars - tub/shower;Grab bars - toilet Additional Comments: Upwalker with seat, Lift chair, adjustable bed with rails on pt's side  Prior Function Level of Independence: Needs assistance   Gait / Transfers Assistance Needed: Ambulates with his Upwalker limited household.  PCA working with pt on walking farther distances. Uses restorator  at chair level.  ADL's / Homemaking Assistance Needed: Has PCA 4 hrs/day 5 x/wk to help with bathing.  Comments: Per daughter, pt with PCA 5x/wk 11:00am-3:00pm. PCA assists with sponge bathing or shower, meal prep, dressing, and imtermittent peri  hygiene as needed. Wear Depends "just in case".     Hand Dominance   Dominant Hand: Right    Extremity/Trunk Assessment   Upper Extremity Assessment Upper Extremity Assessment: Defer to OT evaluation RUE Deficits / Details: Shoulder limited to ~90 degrees with compensatory scaption/abduction. Dtr reports past shoulder replacement. Elbow-->Grip: [facility]    Lower Extremity Assessment Lower Extremity Assessment: Overall WFL for tasks assessed    Cervical / Trunk Assessment Cervical / Trunk Assessment: Normal (forward head)  Communication   Communication: HOH  Cognition Arousal/Alertness: Awake/alert Behavior During Therapy: WFL for tasks assessed/performed Overall Cognitive Status: Difficult to assess                                 General Comments: Daughter present and denied cognitive changes, difficult to assess d/t diminished hearing/deaf      General Comments      Exercises     Assessment/Plan    PT Assessment Patient needs continued PT services  PT Problem List Decreased strength;Decreased range of motion;Decreased mobility;Decreased activity tolerance;Decreased balance;Pain       PT Treatment Interventions DME instruction;Therapeutic exercise;Gait training;Functional mobility training;Therapeutic activities;Patient/family education;Balance training    PT Goals (Current goals can be found in the Care Plan section)  Acute Rehab PT Goals Patient Stated Goal: Per daughter, for pt to regain strength and return to his baseline. PT Goal Formulation: With patient/family Time For Goal Achievement: 08/14/21 Potential to Achieve Goals: Good    Frequency Min 3X/week   Barriers to discharge        Co-evaluation               AM-PAC PT "6 Clicks" Mobility  Outcome Measure Help needed turning from your back to your side while in a flat bed without using bedrails?: A Little Help needed moving from lying on your back to sitting on the side of a flat  bed without using bedrails?: A Little Help needed moving to and from a bed to a chair (including a wheelchair)?: A Little Help needed standing up from a chair using your arms (e.g., wheelchair or bedside chair)?: A Little Help needed to walk in hospital room?: A Little Help needed climbing 3-5 steps with a railing? : A Lot 6 Click Score: 17    End of Session Equipment Utilized During Treatment: Gait belt Activity Tolerance: Patient tolerated treatment well Patient left: with call bell/phone within reach;in bed;with bed alarm set;with family/visitor present Nurse Communication: Mobility status PT Visit Diagnosis: Other abnormalities of gait and mobility (R26.89);History of falling (Z91.81)    Time: 0350-0938 PT Time Calculation (min) (ACUTE ONLY): 18 min   Charges:   PT Evaluation $PT Eval Low Complexity: 1 Low          Lunette Tapp, PT  Acute Rehab Dept ([facility]) [phone] Pager [phone]  07/31/2021   [facility] 07/31/2021, 2:32 PM

## 2021-07-31 NOTE — Plan of Care (Signed)
  Problem: Education: Goal: Knowledge of risk factors and measures for prevention of condition will improve Outcome: Progressing   Problem: Coping: Goal: Psychosocial and spiritual needs will be supported Outcome: Progressing   Problem: Respiratory: Goal: Will maintain a patent airway Outcome: Progressing   

## 2021-07-31 NOTE — Progress Notes (Signed)
  Echocardiogram 2D Echocardiogram has been performed.  Charles Richard 07/31/2021, 4:19 PM

## 2021-07-31 NOTE — Evaluation (Signed)
Occupational Therapy Evaluation Patient Details Name: Charles Richard MRN: [id number] DOB: [birth year] Today's Date: 07/31/2021    History of Present Illness Pt is an 87 y.o. male with a history of T2DM, HTN, HLD, CVA, AFib, stage III CKD, AAA, and COVID-18 in May 2021 admitted on 07/30/2021 due to fall with incidental finding of Covid-19. Imaging revealed "IMPRESSION:  1. T11 fracture extending through the posterior and superior walls,  with associated prevertebral soft tissue edema and enhancement. No  height loss. The appearance is consistent with a subacute fracture  and early formation of osteonecrotic cleft. The presence of gas at  the fracture site argues against infection.  2. Multiple small central disc protrusions with narrowing of the  ventral thecal sac at T7-8. No spinal canal stenosis or nerve root  impingement."   Clinical Impression   Patient is currently requiring assistance with ADLs including Total assist with toileting, and with LE dressing, maximum assist with sponge bathing, and moderate assist with UE dressing and grooming in sitting, all of which is below patient's typical baseline where he does receive ADL assistance from his PCA but per daughter, is typically able to do more for himself and is steadier when mobilizing.  During this evaluation, patient was limited by baseline severe HOH despite LT hearing aid, generalized weakness and impaired activity tolerance, which has the potential to impact patient's safety and independence during functional mobility, as well as performance for ADLs. [employer] AM-PAC "6-clicks" Daily Activity Inpatient Short Form score of 11/24 this session. Patient lives with his daughter and SIL, who both work but have a PCA 5x/wk from 11:00am-3:00pm to assist with ADLs and meals. Patient demonstrates good rehab potential, and should benefit from continued skilled occupational therapy services while in acute care to maximize safety, independence and  quality of life at home.      Follow Up Recommendations      No post-acute OT recommended.   Equipment Recommendations       Recommendations for Other Services       Precautions / Restrictions Precautions Precautions: Fall Precaution Comments: Airborn/Contact Restrictions Weight Bearing Restrictions: No      Mobility Bed Mobility Overal bed mobility: Needs Assistance Bed Mobility: Supine to Sit;Sit to Supine     Supine to sit: Min assist;HOB elevated Sit to supine: Min guard;HOB elevated   General bed mobility comments: Cues to avoid twisting of back, however pt extrememly HOH even with LT hearing aid and not following cues consistenly.    Transfers Overall transfer level: Needs assistance Equipment used: Rolling walker (2 wheeled) Transfers: Sit to/from [employer] Sit to Stand: Min assist Stand pivot transfers: Min assist            Balance Overall balance assessment: Needs assistance   Sitting balance-Leahy Scale: Good Sitting balance - Comments: Good static. Fair dynamic.   Standing balance support: Bilateral upper extremity supported Standing balance-Leahy Scale: Poor Standing balance comment: able to briefly release walker with one hand during static stand but required Min As and unable to perform functional tasks in standing.                           ADL either performed or assessed with clinical judgement   ADL Overall ADL's : Needs assistance/impaired Eating/Feeding: Set up;Bed level   Grooming: Wash/dry face;Wash/dry hands;Moderate assistance;Sitting Grooming Details (indicate cue type and reason): Pt's baseline is having his PCA wash his face for him. Upper  Body Bathing: Moderate assistance;Sitting   Lower Body Bathing: Maximal assistance;Sitting/lateral leans;Bed level Lower Body Bathing Details (indicate cue type and reason): Total assist standing per toileting Upper Body Dressing : Moderate assistance;Sitting    Lower Body Dressing: Total assistance Lower Body Dressing Details (indicate cue type and reason): Total assist to doff and don socks at EOB (baseline per dtr), Total Assist to manage diaper brief in standing for toileting needs. Toilet Transfer: RW;BSC;Squat-pivot;Stand-pivot;Minimal Dentist Details (indicate cue type and reason): Pt required 2 trips to [facility] due to very loose stool with sudden urge. Pt 1st performed squat pivot from EOB to [facility] with cues for hand placement and Min As.  Pt performed 2nd pivot in standing with RW from recliner to [facility] with Min As. Step by step cues for sequencing and safety. Toileting- Clothing Manipulation and Hygiene: Total assistance;+2 for physical assistance;Sit to/from stand Toileting - Clothing Manipulation Details (indicate cue type and reason): Pt able to stand and release RW with RT hand. Washcloth placed in pt's RT hand and pt encouraged to perform hygiene, however pt passed off washcloth to daugther. Pt required Total Assist after each bowel movement for clothing management and peri care.     Functional mobility during ADLs: Minimal assistance;Rolling walker;Cueing for safety;Cueing for sequencing       Vision Patient Visual Report: No change from baseline Vision Assessment?: No apparent visual deficits     Perception     Praxis      Pertinent Vitals/Pain Pain Assessment: No/denies pain     Hand Dominance Right   Extremity/Trunk Assessment Upper Extremity Assessment Upper Extremity Assessment: Overall WFL for tasks assessed;RUE deficits/detail RUE Deficits / Details: Shoulder limited to ~90 degrees with compensatory scaption/abduction. Dtr reports past shoulder replacement. Elbow-->Grip: [facility]   Lower Extremity Assessment Lower Extremity Assessment: Defer to PT evaluation   Cervical / Trunk Assessment Cervical / Trunk Assessment: Normal (forward head)   Communication Communication Communication: HOH   Cognition  Arousal/Alertness: Awake/alert Behavior During Therapy: WFL for tasks assessed/performed Overall Cognitive Status: Difficult to assess                                 General Comments: Daughter present and denied cognitive changes   General Comments       Exercises     Shoulder Instructions      Home Living Family/patient expects to be discharged to:: Private residence Living Arrangements: Children (Daughter and SIL) Available Help at Discharge: Family;Available 24 hours/day Type of Home: House Home Access: Ramped entrance     Home Layout: One level     Bathroom Shower/Tub: Producer, television/film/video: Handicapped height Bathroom Accessibility: Yes   Home Equipment: Transport chair;Cane - single point;Shower seat;Walker - 2 wheels;Bedside commode;Hand held shower head;Grab bars - tub/shower;Grab bars - toilet   Additional Comments: Upwalker with seat, Lift chair, adjustable bed with rails on pt's side      Prior Functioning/Environment Level of Independence: Needs assistance  Gait / Transfers Assistance Needed: Ambulates with his Upwalker limited household.  PCA working with pt on walking farther distances. Uses foot bike at chair level. ADL's / Homemaking Assistance Needed: Has PCA 4 hrs/day 5 x/wk to help with bathing. Communication / Swallowing Assistance Needed: HOH Comments: Per daughter, pt with PCA 5x/wk 11:00am-3:00pm. PCA assists with sponge bathing or shower, meal prep, dressing, and imtermittent peri hygiene as needed. Wear Depends "just in case".  OT Problem List: Decreased strength;Decreased activity tolerance;Decreased safety awareness;Impaired balance (sitting and/or standing);Decreased knowledge of use of DME or AE      OT Treatment/Interventions: Self-care/ADL training;Therapeutic activities;Energy conservation;DME and/or AE instruction;Patient/family education;Balance training    OT Goals(Current goals can be found in the  care plan section) Acute Rehab OT Goals Patient Stated Goal: Per daughter, for pt to regain strength and return to his baseline. OT Goal Formulation: With family Time For Goal Achievement: 08/14/21 Potential to Achieve Goals: Good ADL Goals Pt Will Perform Grooming: sitting;with min guard assist;with set-up (EOB with good balance) Pt Will Perform Upper Body Dressing: with set-up;sitting;with min guard assist Pt Will Transfer to Toilet: with supervision;ambulating;bedside commode Pt Will Perform Toileting - Clothing Manipulation and hygiene: sitting/lateral leans;with adaptive equipment;with min guard assist (And daughter will verbalize understanding to alternative for peri care such as attachable bidets to decrease caregiver burden.)  OT Frequency: Min 2X/week   Barriers to D/C:            Co-evaluation              AM-PAC OT "6 Clicks" Daily Activity     Outcome Measure Help from another person eating meals?: A Little Help from another person taking care of personal grooming?: A Lot Help from another person toileting, which includes using toliet, bedpan, or urinal?: Total Help from another person bathing (including washing, rinsing, drying)?: A Lot Help from another person to put on and taking off regular upper body clothing?: A Lot Help from another person to put on and taking off regular lower body clothing?: Total 6 Click Score: 11   End of Session Equipment Utilized During Treatment: Rolling walker Nurse Communication: Mobility status (Stools.)  Activity Tolerance: Patient tolerated treatment well;Treatment limited secondary to medical complications (Comment) (Limited by frequent, urgent liquid bowel movements) Patient left: in bed;with call bell/phone within reach;with family/visitor present  OT Visit Diagnosis: Unsteadiness on feet (R26.81);Repeated falls (R29.6);History of falling (Z91.81)                Time: 8502-7741 OT Time Calculation (min): 31 min Charges:   OT General Charges $OT Visit: 1 Visit OT Evaluation $OT Eval Moderate Complexity: 1 Mod OT Treatments $Self Care/Home Management : 8-22 mins  Victorino Dike, OT Acute Rehab Services Office: [phone] 07/31/2021  Theodoro Clock 07/31/2021, 1:28 PM

## 2021-07-31 NOTE — Progress Notes (Signed)
Patient with large BM after taking Kayexalate and eating almost his entire breakfast.  Did report some mild nausea afterwards.

## 2021-08-01 ENCOUNTER — Encounter (HOSPITAL_COMMUNITY): Payer: Self-pay | Admitting: Family Medicine

## 2021-08-01 DIAGNOSIS — U071 COVID-19: Secondary | ICD-10-CM

## 2021-08-01 DIAGNOSIS — S22089A Unspecified fracture of T11-T12 vertebra, initial encounter for closed fracture: Secondary | ICD-10-CM | POA: Diagnosis present

## 2021-08-01 DIAGNOSIS — N1831 Chronic kidney disease, stage 3a: Secondary | ICD-10-CM | POA: Diagnosis present

## 2021-08-01 DIAGNOSIS — W19XXXA Unspecified fall, initial encounter: Secondary | ICD-10-CM | POA: Diagnosis present

## 2021-08-01 DIAGNOSIS — S22088A Other fracture of T11-T12 vertebra, initial encounter for closed fracture: Secondary | ICD-10-CM | POA: Diagnosis not present

## 2021-08-01 DIAGNOSIS — E875 Hyperkalemia: Secondary | ICD-10-CM | POA: Diagnosis present

## 2021-08-01 DIAGNOSIS — Z66 Do not resuscitate: Secondary | ICD-10-CM | POA: Diagnosis present

## 2021-08-01 DIAGNOSIS — N179 Acute kidney failure, unspecified: Secondary | ICD-10-CM | POA: Diagnosis present

## 2021-08-01 DIAGNOSIS — I4821 Permanent atrial fibrillation: Secondary | ICD-10-CM

## 2021-08-01 DIAGNOSIS — I129 Hypertensive chronic kidney disease with stage 1 through stage 4 chronic kidney disease, or unspecified chronic kidney disease: Secondary | ICD-10-CM | POA: Diagnosis present

## 2021-08-01 DIAGNOSIS — L97519 Non-pressure chronic ulcer of other part of right foot with unspecified severity: Secondary | ICD-10-CM | POA: Diagnosis present

## 2021-08-01 DIAGNOSIS — E538 Deficiency of other specified B group vitamins: Secondary | ICD-10-CM | POA: Diagnosis present

## 2021-08-01 DIAGNOSIS — I714 Abdominal aortic aneurysm, without rupture: Secondary | ICD-10-CM | POA: Diagnosis present

## 2021-08-01 DIAGNOSIS — H9190 Unspecified hearing loss, unspecified ear: Secondary | ICD-10-CM | POA: Diagnosis present

## 2021-08-01 DIAGNOSIS — E1122 Type 2 diabetes mellitus with diabetic chronic kidney disease: Secondary | ICD-10-CM | POA: Diagnosis present

## 2021-08-01 DIAGNOSIS — L97529 Non-pressure chronic ulcer of other part of left foot with unspecified severity: Secondary | ICD-10-CM | POA: Diagnosis present

## 2021-08-01 DIAGNOSIS — I472 Ventricular tachycardia: Secondary | ICD-10-CM

## 2021-08-01 DIAGNOSIS — N200 Calculus of kidney: Secondary | ICD-10-CM | POA: Diagnosis present

## 2021-08-01 DIAGNOSIS — Z8616 Personal history of COVID-19: Secondary | ICD-10-CM | POA: Diagnosis not present

## 2021-08-01 DIAGNOSIS — E11621 Type 2 diabetes mellitus with foot ulcer: Secondary | ICD-10-CM | POA: Diagnosis present

## 2021-08-01 DIAGNOSIS — I252 Old myocardial infarction: Secondary | ICD-10-CM | POA: Diagnosis not present

## 2021-08-01 DIAGNOSIS — E860 Dehydration: Secondary | ICD-10-CM | POA: Diagnosis present

## 2021-08-01 DIAGNOSIS — D631 Anemia in chronic kidney disease: Secondary | ICD-10-CM | POA: Diagnosis present

## 2021-08-01 DIAGNOSIS — E785 Hyperlipidemia, unspecified: Secondary | ICD-10-CM | POA: Diagnosis present

## 2021-08-01 DIAGNOSIS — K5900 Constipation, unspecified: Secondary | ICD-10-CM | POA: Diagnosis present

## 2021-08-01 DIAGNOSIS — I10 Essential (primary) hypertension: Secondary | ICD-10-CM | POA: Diagnosis not present

## 2021-08-01 LAB — CBC WITH DIFFERENTIAL/PLATELET
Abs Immature Granulocytes: 0.07 10*3/uL (ref 0.00–0.07)
Basophils Absolute: 0.1 10*3/uL (ref 0.0–0.1)
Basophils Relative: 1 %
Eosinophils Absolute: 0.3 10*3/uL (ref 0.0–0.5)
Eosinophils Relative: 3 %
HCT: 30.6 % — ABNORMAL LOW (ref 39.0–52.0)
Hemoglobin: 9.9 g/dL — ABNORMAL LOW (ref 13.0–17.0)
Immature Granulocytes: 1 %
Lymphocytes Relative: 22 %
Lymphs Abs: 1.8 10*3/uL (ref 0.7–4.0)
MCH: 32.4 pg (ref 26.0–34.0)
MCHC: 32.4 g/dL (ref 30.0–36.0)
MCV: 100 fL (ref 80.0–100.0)
Monocytes Absolute: 0.8 10*3/uL (ref 0.1–1.0)
Monocytes Relative: 10 %
Neutro Abs: 5.2 10*3/uL (ref 1.7–7.7)
Neutrophils Relative %: 63 %
Platelets: 242 10*3/uL (ref 150–400)
RBC: 3.06 MIL/uL — ABNORMAL LOW (ref 4.22–5.81)
RDW: 13 % (ref 11.5–15.5)
WBC: 8.2 10*3/uL (ref 4.0–10.5)
nRBC: 0 % (ref 0.0–0.2)

## 2021-08-01 LAB — GLUCOSE, CAPILLARY
Glucose-Capillary: 110 mg/dL — ABNORMAL HIGH (ref 70–99)
Glucose-Capillary: 98 mg/dL (ref 70–99)
Glucose-Capillary: 96 mg/dL (ref 70–99)
Glucose-Capillary: 149 mg/dL — ABNORMAL HIGH (ref 70–99)
Glucose-Capillary: 110 mg/dL — ABNORMAL HIGH (ref 70–99)
Glucose-Capillary: 126 mg/dL — ABNORMAL HIGH (ref 70–99)

## 2021-08-01 LAB — RENAL FUNCTION PANEL
Albumin: 2.8 g/dL — ABNORMAL LOW (ref 3.5–5.0)
Anion gap: 6 (ref 5–15)
BUN: 28 mg/dL — ABNORMAL HIGH (ref 8–23)
CO2: 23 mmol/L (ref 22–32)
Calcium: 8.1 mg/dL — ABNORMAL LOW (ref 8.9–10.3)
Chloride: 110 mmol/L (ref 98–111)
Creatinine, Ser: 1.51 mg/dL — ABNORMAL HIGH (ref 0.61–1.24)
GFR, Estimated: 44 mL/min — ABNORMAL LOW (ref >60)
Glucose, Bld: 115 mg/dL — ABNORMAL HIGH (ref 70–99)
Phosphorus: 3.4 mg/dL (ref 2.5–4.6)
Potassium: 4 mmol/L (ref 3.5–5.1)
Sodium: 139 mmol/L (ref 135–145)

## 2021-08-01 LAB — MAGNESIUM: Magnesium: 1.6 mg/dL — ABNORMAL LOW (ref 1.7–2.4)

## 2021-08-01 MED ORDER — MAGNESIUM SULFATE 2 GM/50ML IV SOLN
2.0000 g | Freq: Once | INTRAVENOUS | Status: AC
  Administered 2021-08-01: 2 g via INTRAVENOUS
  Filled 2021-08-01: qty 50

## 2021-08-01 NOTE — TOC Progression Note (Signed)
Transition of Care [facility]) - Progression Note    Patient Details  Name: Charles Richard MRN: [id number] Date of Birth: [birth year]  Transition of [facility]) CM/SW Contact  Geni Bers, RN Phone Number: 08/01/2021, 1:13 PM  Clinical Narrative:   Spoke with pt's daughter via telephone Pt is home with son and PCS service from the [place]. [phone] [facility] given to daughter to report pt is here at [facility]. Daughter states that pt have [employer] ins and covered for HHPT.   Pt is from home with son also PCS from the [place] 3.5 hours a day. Pt has [employer] and [place]. PCP Dr. Patria Mane at [place], [place] Starleen Blue [phone].    Expected Discharge Plan: Home w Home Health Services Barriers to Discharge: No Barriers Identified  Expected Discharge Plan and Services Expected Discharge Plan: Home w Home Health Services       Living arrangements for the past 2 months: Single Family Home                                       Social Determinants of Health (SDOH) Interventions    Readmission Risk Interventions No flowsheet data found.

## 2021-08-01 NOTE — Progress Notes (Signed)
Progress Note  Patient Name: Charles Richard Date of Encounter: 08/01/2021  [facility] Cardiologist: None Dr. Herbie BaltimoreHarding   Subjective   Complains of L hip pain. No chest pain or palpitations.   Inpatient Medications    Scheduled Meds:  acetaminophen  500 mg Oral TID   apixaban  2.5 mg Oral BID   carvedilol  6.25 mg Oral BID WC   cholecalciferol  2,000 Units Oral Daily   cyanocobalamin  1,000 mcg Subcutaneous Daily   fluticasone  1 spray Each Nare Daily   gabapentin  300 mg Oral QHS   insulin aspart  0-5 Units Subcutaneous QHS   insulin aspart  0-9 Units Subcutaneous TID WC   latanoprost  1 drop Both Eyes QHS   meclizine  25 mg Oral BID   mupirocin cream   Topical BID   senna-docusate  1 tablet Oral BID   tamsulosin  0.4 mg Oral Daily   zolpidem  5 mg Oral QHS   Continuous Infusions:  sodium chloride 100 mL/hr at 08/01/21 0335   magnesium sulfate bolus IVPB 2 g (08/01/21 0842)   PRN Meds: acetaminophen **OR** acetaminophen, hydrALAZINE, ondansetron **OR** ondansetron (ZOFRAN) IV, sorbitol, traMADol   Vital Signs    Vitals:   07/31/21 1216 07/31/21 2100 08/01/21 0356 08/01/21 0500  BP: 137/77 (!) 150/88 122/73   Pulse: 91  92   Resp: 14 17 14    Temp: (!) 97.5 F (36.4 C) 97.6 F (36.4 C) 97.9 F (36.6 C)   TempSrc: Oral Oral Oral   SpO2:  97% 97%   Weight:    103.9 kg  Height:        Intake/Output Summary (Last 24 hours) at 08/01/2021 0942 Last data filed at 08/01/2021 0913 Gross per 24 hour  Intake 3160.17 ml  Output 900 ml  Net 2260.17 ml   Last 3 Weights 08/01/2021 07/30/2021 07/30/2021  Weight (lbs) 229 lb 0.9 oz 230 lb 6.1 oz 215 lb  Weight (kg) 103.9 kg 104.5 kg 97.523 kg      Telemetry    Sinus rhythm with PVCs - Personally Reviewed  ECG    N/A - Personally Reviewed  Physical Exam   GEN: No acute distress.   Neck: No JVD Cardiac: RRR, no murmurs, rubs, or gallops.  Respiratory: Clear to auscultation bilaterally. GI: Soft, nontender,  non-distended  ZO:XWRUES:Trace  edema; No deformity. Neuro:  Nonfocal  Psych: Normal affect   Labs   Chemistry Recent Labs  Lab 07/30/21 1418 07/31/21 0332 07/31/21 1250 08/01/21 0353  NA 135 139 135 139  K 5.4* 5.7* 5.6* 4.0  CL 103 109 105 110  CO2 23 24 24 23   GLUCOSE 159* 102* 95 115*  BUN 40* 36* 35* 28*  CREATININE 2.04* 1.87* 1.78* 1.51*  CALCIUM 8.9 8.5* 8.2* 8.1*  PROT 6.4*  --   --   --   ALBUMIN 3.2*  --  2.7* 2.8*  AST 17  --   --   --   ALT 15  --   --   --   ALKPHOS 39  --   --   --   BILITOT 0.6  --   --   --   GFRNONAA 31* 34* 36* 44*  ANIONGAP 9 6 6 6      Hematology Recent Labs  Lab 07/30/21 1418 07/31/21 0332 08/01/21 0353  WBC 9.2 8.7 8.2  RBC 3.58* 3.02* 3.06*  HGB 11.5* 9.8* 9.9*  HCT 35.9* 30.3* 30.6*  MCV  100.3* 100.3* 100.0  MCH 32.1 32.5 32.4  MCHC 32.0 32.3 32.4  RDW 13.0 12.8 13.0  PLT 251 226 242     Radiology    MR THORACIC SPINE W WO CONTRAST  Result Date: 07/30/2021 CLINICAL DATA:  Fall 1 week ago EXAM: MRI THORACIC WITHOUT AND WITH CONTRAST TECHNIQUE: Multiplanar and multiecho pulse sequences of the thoracic spine were obtained without and with intravenous contrast. CONTRAST:  71mL GADAVIST GADOBUTROL 1 MMOL/ML IV SOLN COMPARISON:  None. FINDINGS: Alignment:  Physiologic. Vertebrae: Fracture at T11 extends through the posterior and superior walls. No height loss. On the concomitant CT, there is gas within the fracture cleft. Cord:  Normal Paraspinal and other soft tissues: Prevertebral soft tissue edema with enhancement at the T10-11 levels. Disc levels: There are multiple small central disc protrusions with narrowing of the ventral thecal sac at T7-8. There is no spinal canal stenosis. No nerve root impingement. IMPRESSION: 1. T11 fracture extending through the posterior and superior walls, with associated prevertebral soft tissue edema and enhancement. No height loss. The appearance is consistent with a subacute fracture and early  formation of osteonecrotic cleft. The presence of gas at the fracture site argues against infection. 2. Multiple small central disc protrusions with narrowing of the ventral thecal sac at T7-8. No spinal canal stenosis or nerve root impingement. Electronically Signed   By: Deatra Robinson M.D.   On: 07/30/2021 19:09   ECHOCARDIOGRAM COMPLETE  Result Date: 07/31/2021    ECHOCARDIOGRAM REPORT   Patient Name:   Charles Richard Date of Exam: 07/31/2021 Medical Rec #:  [id number]        Height:       72.0 in Accession #:    [id number]       Weight:       230.4 lb Date of Birth:  [birth year]       BSA:          2.263 m Patient Age:    87 years         BP:           137/77 mmHg Patient Gender: M                HR:           91 bpm. Exam Location:  Inpatient Procedure: 2D Echo, Cardiac Doppler and Color Doppler Indications:    I47.2 Ventricular tachycardia  History:        Patient has prior history of Echocardiogram examinations, most                 recent 05/09/2017. Previous Myocardial Infarction, Stroke,                 Arrythmias:Atrial Fibrillation; Risk Factors:Hypertension,                 Diabetes and Dyslipidemia. COVID-19 positive.  Sonographer:    Tiffany Dance RVT Referring Phys: [id number] DANIEL V THOMPSON IMPRESSIONS  1. Left ventricular ejection fraction, by estimation, is 55 to 60%. The left ventricle has normal function. The left ventricle has no regional wall motion abnormalities. There is moderate left ventricular hypertrophy. Left ventricular diastolic parameters are indeterminate.  2. Right ventricular systolic function is normal. The right ventricular size is normal. There is normal pulmonary artery systolic pressure. The estimated right ventricular systolic pressure is 19.6 mmHg.  3. Left atrial size was mildly dilated.  4. The mitral valve is grossly normal. Trivial mitral valve regurgitation.  5. The  aortic valve is tricuspid. Aortic valve regurgitation is not visualized. Mild aortic valve sclerosis is  present, with no evidence of aortic valve stenosis.  6. The inferior vena cava is normal in size with greater than 50% respiratory variability, suggesting right atrial pressure of 3 mmHg. Comparison(s): No prior Echocardiogram. FINDINGS  Left Ventricle: Left ventricular ejection fraction, by estimation, is 55 to 60%. The left ventricle has normal function. The left ventricle has no regional wall motion abnormalities. The left ventricular internal cavity size was normal in size. There is  moderate left ventricular hypertrophy. Left ventricular diastolic parameters are indeterminate. Right Ventricle: The right ventricular size is normal. No increase in right ventricular wall thickness. Right ventricular systolic function is normal. There is normal pulmonary artery systolic pressure. The tricuspid regurgitant velocity is 2.04 m/s, and  with an assumed right atrial pressure of 3 mmHg, the estimated right ventricular systolic pressure is 19.6 mmHg. Left Atrium: Left atrial size was mildly dilated. Right Atrium: Right atrial size was normal in size. Pericardium: There is no evidence of pericardial effusion. Presence of pericardial fat pad. Mitral Valve: The mitral valve is grossly normal. Trivial mitral valve regurgitation. Tricuspid Valve: The tricuspid valve is grossly normal. Tricuspid valve regurgitation is trivial. Aortic Valve: The aortic valve is tricuspid. There is mild aortic valve annular calcification. Aortic valve regurgitation is not visualized. Mild aortic valve sclerosis is present, with no evidence of aortic valve stenosis. Pulmonic Valve: The pulmonic valve was not well visualized. Pulmonic valve regurgitation is trivial. Aorta: The aortic root is normal in size and structure. Venous: The inferior vena cava is normal in size with greater than 50% respiratory variability, suggesting right atrial pressure of 3 mmHg. IAS/Shunts: No atrial level shunt detected by color flow Doppler.  LEFT VENTRICLE PLAX 2D  LVIDd:         4.50 cm LVIDs:         3.30 cm LV PW:         1.40 cm LV IVS:        1.30 cm LVOT diam:     2.10 cm LV SV:         47 LV SV Index:   21 LVOT Area:     3.46 cm  RIGHT VENTRICLE            IVC RV Basal diam:  2.70 cm    IVC diam: 1.70 cm RV S prime:     8.81 cm/s TAPSE (M-mode): 1.2 cm LEFT ATRIUM             Index       RIGHT ATRIUM           Index LA diam:        4.00 cm 1.77 cm/m  RA Area:     14.00 cm LA Vol (A2C):   84.1 ml 37.17 ml/m RA Volume:   26.60 ml  11.76 ml/m LA Vol (A4C):   71.4 ml 31.56 ml/m LA Biplane Vol: 77.4 ml 34.21 ml/m  AORTIC VALVE LVOT Vmax:   68.05 cm/s LVOT Vmean:  43.250 cm/s LVOT VTI:    0.135 m  AORTA Ao Root diam: 3.50 cm Ao Asc diam:  3.40 cm MITRAL VALVE               TRICUSPID VALVE MV Area (PHT): 4.10 cm    TR Peak grad:   16.6 mmHg MV Decel Time: 185 msec    TR Vmax:  204.00 cm/s MV E velocity: 93.25 cm/s MV A velocity: 45.30 cm/s  SHUNTS MV E/A ratio:  2.06        Systemic VTI:  0.14 m                            Systemic Diam: 2.10 cm Nona Dell MD Electronically signed by Nona Dell MD Signature Date/Time: 07/31/2021/4:27:35 PM    Final    CT Renal Stone Study  Result Date: 07/30/2021 CLINICAL DATA:  Bilateral flank pain. History of kidney stone surgery. EXAM: CT ABDOMEN AND PELVIS WITHOUT CONTRAST TECHNIQUE: Multidetector CT imaging of the abdomen and pelvis was performed following the standard protocol without IV contrast. COMPARISON:  12/13/2020 FINDINGS: Lower chest: Small, right greater than left, pleural effusions. Mild dependent subsegmental atelectasis in the lower lobes. Hepatobiliary: No focal liver abnormality is seen. Status post cholecystectomy. No biliary dilatation. Pancreas: Unremarkable. No pancreatic ductal dilatation or surrounding inflammatory changes. Spleen: Normal in size without focal abnormality. Adrenals/Urinary Tract: No adrenal masses. Bilateral renal cortical thinning. 4.8 cm, medial upper pole right renal  cyst. Exophytic lateral right kidney upper pole cyst. Small hyperattenuating focus along the wall of the medial upper pole cyst, 8 mm in size. These findings are stable. No other renal masses. Bilateral intrarenal stones, similar to the prior CT. No hydronephrosis. Ureters are normal in course and in caliber. No ureteral stones. Normal bladder. Stomach/Bowel: Subtle inflammatory type stranding in the fat lateral to the proximal descending colon. This is new from the prior CT. This portion of the colon is not well distended. No convincing wall thickening. Remainder of the colon is unremarkable. Normal stomach and small bowel. Thick rim calcified mass abuts the 3rd to 4th portion of the duodenum, unchanged, measuring 3.2 cm. Vascular/Lymphatic: Aortic atherosclerosis. No aneurysm. No enlarged lymph nodes. Reproductive: Unremarkable. Other: No abdominal wall hernia or abnormality. No abdominopelvic ascites. Musculoskeletal: Air extends along the superior margin of the T11 vertebra, just below the upper endplate, new compared to the prior CT. There is ill-defined increased attenuation in the retroperitoneal fat anterior to this vertebra and posterior to the diaphragmatic or a. this may reflect a recent fracture or could be infectious/inflammatory in etiology. No other evidence of a fracture.  No suspicious bone lesion. IMPRESSION: 1. No evidence of a ureteral stone or obstructive uropathy. 2. Bilateral nonobstructing intrarenal stones. Diffuse renal cortical thinning consistent with medical renal disease. These findings are stable. 3. Questionable minimal recent fracture of the upper endplate of T11 versus inflammation/infection at this level reflected by increased attenuation in the adjacent fat. These findings could be further assessed and characterized with MRI without and with contrast. 4. Aortic atherosclerosis. 5. Stable, 3.2 cm mass adjacent to the duodenum with a thick rim of peripheral calcification. Etiology  of this is unclear. It does not appear to arise from a vessel although an aneurysm remains possible. Electronically Signed   By: Amie Portland M.D.   On: 07/30/2021 15:00    Cardiac Studies   Echo 07/31/21  1. Left ventricular ejection fraction, by estimation, is 55 to 60%. The  left ventricle has normal function. The left ventricle has no regional  wall motion abnormalities. There is moderate left ventricular hypertrophy.  Left ventricular diastolic  parameters are indeterminate.   2. Right ventricular systolic function is normal. The right ventricular  size is normal. There is normal pulmonary artery systolic pressure. The  estimated right ventricular systolic pressure is  19.6 mmHg.   3. Left atrial size was mildly dilated.   4. The mitral valve is grossly normal. Trivial mitral valve  regurgitation.   5. The aortic valve is tricuspid. Aortic valve regurgitation is not  visualized. Mild aortic valve sclerosis is present, with no evidence of  aortic valve stenosis.   6. The inferior vena cava is normal in size with greater than 50%  respiratory variability, suggesting right atrial pressure of 3 mmHg.   Comparison(s): No prior Echocardiogram.   Patient Profile     Charles Richard is a 87 y.o. male with a hx of permanent atrial fibrillation, CKD stage III, T2DM, hypertension, hyperlipidemia who presents with nausea vomiting and AKI who is being seen for the evaluation of NSVT at the request of Dr. Janee Morn.  Assessment & Plan    NSVT - Asymptomatic 32 beats of WCT in setting of K of 5.7. He ws given Lokelma. K of 4.0 today.  Mg is trending down 2.0>>1.8>>1.6 (supplement given).  - No recurrent tachycardia.  - Echo showed LVEF of 55-60%, no regional WM abnormality, moderate LVH, grade II DD. No significant valvular abnormality.  - Continue Coreg 6.25mg  BID   2. Permanent atrial fibrillation - rate controlled -Continue coreg and Eliquis  For questions or updates, please contact  [facility] Please consult [email or web address] for contact info under        SignedManson Passey, PA  08/01/2021, 9:42 AM

## 2021-08-01 NOTE — Progress Notes (Addendum)
PROGRESS NOTE    Charles Richard  TMA:[id number] DOB: 05/25/1930 DOA: 07/30/2021 PCP: [facility]   Chief Complaint  Patient presents with   Flank Pain    Brief Narrative:  Pleasant 87 year old gentleman history of permanent A. fib, CKD stage IIIa, recurrent kidney stones, AAA, type 2 diabetes, hypertension, hyperlipidemia, HOH presenting with daughter to the ED with back pain, nausea vomiting for weakness.  Patient noted to have been passing several kidney stones which are being incised.  Daughter.  Patient with complaints of constipation.  Patient seen in the ED COVID-19 PCR positive.  CT renal stone did not show obstructive ureteral stone or obstructive uropathy, had bilateral nonobstructing intrarenal stones, question of T11 fracture versus inflammation/infection and 3.2 cm mass adjacent to the duodenum stable.  Patient noted to be AKI on CKD, dehydration hospitalist were called to admit patient.  Patient placed on IV fluids.  Patient noted to have a 31 beat run of wide-complex tachycardia.  Cardiology consultation pending.   Assessment & Plan:   Principal Problem:   Acute renal failure superimposed on stage 3a chronic kidney disease (HCC) Active Problems:   Wide-complex tachycardia (HCC)   Essential hypertension   Type 2 diabetes mellitus without complication (HCC)   AAA (abdominal aortic aneurysm) without rupture (HCC)   COVID-19 virus infection   Permanent atrial fibrillation (HCC)   Closed T11 fracture (HCC)   Dehydration   1 acute kidney injury on chronic kidney disease stage IIIa -Likely secondary to prerenal azotemia in the setting of ACE inhibitor. -Patient presented with nausea vomiting, decreased oral intake.  Patient also with complaints of constipation. -Creatinine noted at 2.04 on admission with prior creatinine of 1.25 on 05/09/2020. -Urinalysis nitrite negative, small leukocytes, 0-5 WBCs. -Patient placed on IV fluids with improving renal function  creatinine down to 1.51 today. -Continue to hold ACE inhibitor. -IV fluids. -Avoid nephrotoxins. -Supportive care.  2.  Wide-complex tachycardia/nonsustained V. tach -Patient noted to have a 31 beat run of WCT/NSVT on 07/31/2021.. -Patient asymptomatic. -No further bouts of wide-complex tachycardia. -TSH within normal limits.   -2D echo with EF of 55 to 60%, NWMA, moderate LVH, normal right ventricular systolic function.  -Keep potassium > 4, Mag > 2.  -Continue Coreg 6.25 mg twice daily for rate control. -Eliquis for anticoagulation. -Cardiology consulted and following with no further recommendations at this time and recommending outpatient follow-up.  3.  Permanent A. fib -Continue Coreg twice daily for rate control.   -Eliquis for anticoagulation.   -Cardiology following.   4.  Dehydration -IV fluids.  5.  Hyperkalemia -Likely secondary to acute kidney injury -Kayexalate 45 g p.o. x1 and Lokelma. -Potassium of 4.0 this morning.   -Discontinue Lokelma.    6.  Anemia of chronic disease/vitamin B12 deficiency -Patient with anemia with no overt bleeding. -Likely dilutional component. -Anemia panel with iron of 61, TIBC of 212, ferritin of 110, vitamin B12 of 176. -Hemoglobin stable at 9.9 -Continue vitamin B12 supplementation.   -Follow H&H. -Transfusion threshold hemoglobin < 8.  7.  COVID-19 infection -Patient not hypoxic with sats of 97% on room air. -Hold off on steroids or remdesivir. -Supportive care.  8.  Diabetes mellitus type 2 -Hemoglobin A1c 6.4 (07/30/2021). -CBG 96 this morning. -Continue to hold oral hypoglycemic agents.   -SSI.  9.  Hypertension -Controlled on Coreg.   -Continue to hold ACE inhibitor secondary to AKI.   #10: Hyperlipidemia -Statin.  11.  Hypomagnesemia Magnesium sulfate 2 g IV ordered this  morning.  Repeat labs in the morning. -Keep magnesium > 2.  12.  T11 compression fracture -MRI showing T11 compression fracture extending  through posterior and superior walls with associated prevertebral soft tissue edema and enhancement with no height loss.  Appearance consistent with subacute fracture with early formation of osteonecrotic cleft.  Presence of gas at the fracture site argues against infection. -Multiple small disc protrusions with narrowing of ventral thecal sac at T7-8.  No spinal canal stenosis or nerve root impingement. -Patient with no complaints of significant excruciating pain. -PT/OT. -Continue scheduled Tylenol. -Supportive care.   DVT prophylaxis: Eliquis Code Status: DNR Family Communication: Updated son at bedside. Disposition:   Status is: Inpatient    Dispo: The patient is from: Home              Anticipated d/c is to: Home with home health               Patient currently is not medically stable to d/c.   Difficult to place patient No       Consultants:  Cardiology: Dr. Bjorn Pippin 07/31/2021  Procedures:  CT renal stone protocol 07/30/2021 MRI T-spine 07/30/2021 2D echo 07/31/2021   Antimicrobials:  None   Subjective: Laying in bed.  Alert.  Denies any chest pain.  No shortness of breath.  No abdominal pain.  Complaining of a little bit of lower back pain on his right lower back near the right butt cheek and feels like a kidney stone.  Tolerating diet.  No nausea or vomiting.   Objective: Vitals:   07/31/21 1216 07/31/21 2100 08/01/21 0356 08/01/21 0500  BP: 137/77 (!) 150/88 122/73   Pulse: 91  92   Resp: Temp: (!) 97.5 F (36.4 C) 97.6 F (36.4 C) 97.9 F (36.6 C)   TempSrc: Oral Oral Oral   SpO2:  97% 97%   Weight:    103.9 kg  Height:        Intake/Output Summary (Last 24 hours) at 08/01/2021 1241 Last data filed at 08/01/2021 0913 Gross per 24 hour  Intake 2920.17 ml  Output 900 ml  Net 2020.17 ml    Filed Weights   07/30/21 1355 07/30/21 2205 08/01/21 0500  Weight: 97.5 kg 104.5 kg 103.9 kg    Examination:  General exam: NAD Respiratory  system: Lungs clear to auscultation bilaterally.  No wheezes, no crackles, no rhonchi.  Normal respiratory effort. Cardiovascular system: RRR no murmurs rubs or gallops.  No JVD.  No lower extremity edema.   Gastrointestinal system: Abdomen is soft, nontender, nondistended, positive bowel sounds.  No rebound.  No guarding.  Central nervous system: Alert and oriented. No focal neurological deficits. Extremities: Symmetric 5 x 5 power. Skin: No rashes, lesions or ulcers Psychiatry: Judgement and insight appear fair. Mood & affect appropriate.     Data Reviewed: I have personally reviewed following labs and imaging studies  CBC: Recent Labs  Lab 07/30/21 1418 07/31/21 0332 08/01/21 0353  WBC 9.2 8.7 8.2  NEUTROABS 6.8  --  5.2  HGB 11.5* 9.8* 9.9*  HCT 35.9* 30.3* 30.6*  MCV 100.3* 100.3* 100.0  PLT 251 226 242     Basic Metabolic Panel: Recent Labs  Lab 07/30/21 1418 07/31/21 0332 07/31/21 1250 08/01/21 0353  NA 135 139 135 139  K 5.4* 5.7* 5.6* 4.0  CL 103 109 105 110  CO2 GLUCOSE 159* 102* 95 115*  BUN 40* 36* 35*  28*  CREATININE 2.04* 1.87* 1.78* 1.51*  CALCIUM 8.9 8.5* 8.2* 8.1*  MG 2.0  --  1.8 1.6*  PHOS  --   --  3.0 3.4     GFR: Estimated Creatinine Clearance: 42.1 mL/min (A) (by C-G formula based on SCr of 1.51 mg/dL (H)).  Liver Function Tests: Recent Labs  Lab 07/30/21 1418 07/31/21 1250 08/01/21 0353  AST 17  --   --   ALT 15  --   --   ALKPHOS 39  --   --   BILITOT 0.6  --   --   PROT 6.4*  --   --   ALBUMIN 3.2* 2.7* 2.8*     CBG: Recent Labs  Lab 07/31/21 1121 07/31/21 1619 07/31/21 2111 08/01/21 0804 08/01/21 1148  GLUCAP 174* 98 161* 96 149*      Recent Results (from the past 240 hour(s))  Resp Panel by RT-PCR (Flu A&B, Covid) Nasopharyngeal Swab     Status: Abnormal   Collection Time: 07/30/21  5:20 PM   Specimen: Nasopharyngeal Swab; Nasopharyngeal(NP) swabs in vial transport medium  Result Value Ref Range  Status   SARS Coronavirus 2 by RT PCR POSITIVE (A) NEGATIVE Final    Comment: CRITICAL POSITIVE DAT CALLED TO, READ BACK AND VERIFIED WITH: Bartholome BillJR RIMANDO, RN (NOTE) SARS-CoV-2 target nucleic acids are DETECTED.  The SARS-CoV-2 RNA is generally detectable in upper respiratory specimens during the acute phase of infection. Positive results are indicative of the presence of the identified virus, but do not rule out bacterial infection or co-infection with other pathogens not detected by the test. Clinical correlation with patient history and other diagnostic information is necessary to determine patient infection status. The expected result is Negative.  Fact Sheet for Patients: BloggerCourse.comhttps://www.fda.gov/media/152166/download  Fact Sheet for Healthcare Providers: SeriousBroker.ithttps://www.fda.gov/media/152162/download  This test is not yet approved or cleared by the [place] FDA and  has been authorized for detection and/or diagnosis of SARS-CoV-2 by FDA under an Emergency Use Authorization (EUA).  This EUA will remain in effect (meaning this test can be used)  for the duration of  the COVID-19 declaration under Section 564(b)(1) of the Act, 21 U.S.C. section 360bbb-3(b)(1), unless the authorization is terminated or revoked sooner.     Influenza A by PCR NEGATIVE NEGATIVE Final   Influenza B by PCR NEGATIVE NEGATIVE Final    Comment: (NOTE) The Xpert Xpress SARS-CoV-2/FLU/RSV plus assay is intended as an aid in the diagnosis of influenza from Nasopharyngeal swab specimens and should not be used as a sole basis for treatment. Nasal washings and aspirates are unacceptable for Xpert Xpress SARS-CoV-2/FLU/RSV testing.  Fact Sheet for Patients: BloggerCourse.comhttps://www.fda.gov/media/152166/download  Fact Sheet for Healthcare Providers: SeriousBroker.ithttps://www.fda.gov/media/152162/download  This test is not yet approved or cleared by the [place] FDA and has been authorized for detection and/or diagnosis of  SARS-CoV-2 by FDA under an Emergency Use Authorization (EUA). This EUA will remain in effect (meaning this test can be used) for the duration of the COVID-19 declaration under Section 564(b)(1) of the Act, 21 U.S.C. section 360bbb-3(b)(1), unless the authorization is terminated or revoked.  Performed at [facility], [address] [place], [place] [address]           Radiology Studies: MR THORACIC SPINE W WO CONTRAST  Result Date: 07/30/2021 CLINICAL DATA:  Fall 1 week ago EXAM: MRI THORACIC WITHOUT AND WITH CONTRAST TECHNIQUE: Multiplanar and multiecho pulse sequences of the thoracic spine were obtained without and with intravenous contrast.  CONTRAST:  10mL GADAVIST GADOBUTROL 1 MMOL/ML IV SOLN COMPARISON:  None. FINDINGS: Alignment:  Physiologic. Vertebrae: Fracture at T11 extends through the posterior and superior walls. No height loss. On the concomitant CT, there is gas within the fracture cleft. Cord:  Normal Paraspinal and other soft tissues: Prevertebral soft tissue edema with enhancement at the T10-11 levels. Disc levels: There are multiple small central disc protrusions with narrowing of the ventral thecal sac at T7-8. There is no spinal canal stenosis. No nerve root impingement. IMPRESSION: 1. T11 fracture extending through the posterior and superior walls, with associated prevertebral soft tissue edema and enhancement. No height loss. The appearance is consistent with a subacute fracture and early formation of osteonecrotic cleft. The presence of gas at the fracture site argues against infection. 2. Multiple small central disc protrusions with narrowing of the ventral thecal sac at T7-8. No spinal canal stenosis or nerve root impingement. Electronically Signed   By: Deatra Robinson M.D.   On: 07/30/2021 19:09   ECHOCARDIOGRAM COMPLETE  Result Date: 07/31/2021    ECHOCARDIOGRAM REPORT   Patient Name:   Charles Richard Date of Exam: 07/31/2021 Medical Rec #:   [id number]        Height:       72.0 in Accession #:    [id number]       Weight:       230.4 lb Date of Birth:  Jun 17, 1930       BSA:          2.263 m Patient Age:    89 years         BP:           137/77 mmHg Patient Gender: M                HR:           91 bpm. Exam Location:  Inpatient Procedure: 2D Echo, Cardiac Doppler and Color Doppler Indications:    I47.2 Ventricular tachycardia  History:        Patient has prior history of Echocardiogram examinations, most                 recent 05/09/2017. Previous Myocardial Infarction, Stroke,                 Arrythmias:Atrial Fibrillation; Risk Factors:Hypertension,                 Diabetes and Dyslipidemia. COVID-19 positive.  Sonographer:    Tiffany Dance RVT Referring Phys: [id number] Cyle Kenyon V Collin Hendley IMPRESSIONS  1. Left ventricular ejection fraction, by estimation, is 55 to 60%. The left ventricle has normal function. The left ventricle has no regional wall motion abnormalities. There is moderate left ventricular hypertrophy. Left ventricular diastolic parameters are indeterminate.  2. Right ventricular systolic function is normal. The right ventricular size is normal. There is normal pulmonary artery systolic pressure. The estimated right ventricular systolic pressure is 19.6 mmHg.  3. Left atrial size was mildly dilated.  4. The mitral valve is grossly normal. Trivial mitral valve regurgitation.  5. The aortic valve is tricuspid. Aortic valve regurgitation is not visualized. Mild aortic valve sclerosis is present, with no evidence of aortic valve stenosis.  6. The inferior vena cava is normal in size with greater than 50% respiratory variability, suggesting right atrial pressure of 3 mmHg. Comparison(s): No prior Echocardiogram. FINDINGS  Left Ventricle: Left ventricular ejection fraction, by estimation, is 55 to 60%. The left ventricle has normal function. The left  ventricle has no regional wall motion abnormalities. The left ventricular internal cavity size was  normal in size. There is  moderate left ventricular hypertrophy. Left ventricular diastolic parameters are indeterminate. Right Ventricle: The right ventricular size is normal. No increase in right ventricular wall thickness. Right ventricular systolic function is normal. There is normal pulmonary artery systolic pressure. The tricuspid regurgitant velocity is 2.04 m/s, and  with an assumed right atrial pressure of 3 mmHg, the estimated right ventricular systolic pressure is 19.6 mmHg. Left Atrium: Left atrial size was mildly dilated. Right Atrium: Right atrial size was normal in size. Pericardium: There is no evidence of pericardial effusion. Presence of pericardial fat pad. Mitral Valve: The mitral valve is grossly normal. Trivial mitral valve regurgitation. Tricuspid Valve: The tricuspid valve is grossly normal. Tricuspid valve regurgitation is trivial. Aortic Valve: The aortic valve is tricuspid. There is mild aortic valve annular calcification. Aortic valve regurgitation is not visualized. Mild aortic valve sclerosis is present, with no evidence of aortic valve stenosis. Pulmonic Valve: The pulmonic valve was not well visualized. Pulmonic valve regurgitation is trivial. Aorta: The aortic root is normal in size and structure. Venous: The inferior vena cava is normal in size with greater than 50% respiratory variability, suggesting right atrial pressure of 3 mmHg. IAS/Shunts: No atrial level shunt detected by color flow Doppler.  LEFT VENTRICLE PLAX 2D LVIDd:         4.50 cm LVIDs:         3.30 cm LV PW:         1.40 cm LV IVS:        1.30 cm LVOT diam:     2.10 cm LV SV:         47 LV SV Index:   21 LVOT Area:     3.46 cm  RIGHT VENTRICLE            IVC RV Basal diam:  2.70 cm    IVC diam: 1.70 cm RV S prime:     8.81 cm/s TAPSE (M-mode): 1.2 cm LEFT ATRIUM             Index       RIGHT ATRIUM           Index LA diam:        4.00 cm 1.77 cm/m  RA Area:     14.00 cm LA Vol (A2C):   84.1 ml 37.17 ml/m RA  Volume:   26.60 ml  11.76 ml/m LA Vol (A4C):   71.4 ml 31.56 ml/m LA Biplane Vol: 77.4 ml 34.21 ml/m  AORTIC VALVE LVOT Vmax:   68.05 cm/s LVOT Vmean:  43.250 cm/s LVOT VTI:    0.135 m  AORTA Ao Root diam: 3.50 cm Ao Asc diam:  3.40 cm MITRAL VALVE               TRICUSPID VALVE MV Area (PHT): 4.10 cm    TR Peak grad:   16.6 mmHg MV Decel Time: 185 msec    TR Vmax:        204.00 cm/s MV E velocity: 93.25 cm/s MV A velocity: 45.30 cm/s  SHUNTS MV E/A ratio:  2.06        Systemic VTI:  0.14 m                            Systemic Diam: 2.10 cm Nona Dell MD Electronically signed by Nona Dell MD  Signature Date/Time: 07/31/2021/4:27:35 PM    Final    CT Renal Stone Study  Result Date: 07/30/2021 CLINICAL DATA:  Bilateral flank pain. History of kidney stone surgery. EXAM: CT ABDOMEN AND PELVIS WITHOUT CONTRAST TECHNIQUE: Multidetector CT imaging of the abdomen and pelvis was performed following the standard protocol without IV contrast. COMPARISON:  12/13/2020 FINDINGS: Lower chest: Small, right greater than left, pleural effusions. Mild dependent subsegmental atelectasis in the lower lobes. Hepatobiliary: No focal liver abnormality is seen. Status post cholecystectomy. No biliary dilatation. Pancreas: Unremarkable. No pancreatic ductal dilatation or surrounding inflammatory changes. Spleen: Normal in size without focal abnormality. Adrenals/Urinary Tract: No adrenal masses. Bilateral renal cortical thinning. 4.8 cm, medial upper pole right renal cyst. Exophytic lateral right kidney upper pole cyst. Small hyperattenuating focus along the wall of the medial upper pole cyst, 8 mm in size. These findings are stable. No other renal masses. Bilateral intrarenal stones, similar to the prior CT. No hydronephrosis. Ureters are normal in course and in caliber. No ureteral stones. Normal bladder. Stomach/Bowel: Subtle inflammatory type stranding in the fat lateral to the proximal descending colon. This is new from  the prior CT. This portion of the colon is not well distended. No convincing wall thickening. Remainder of the colon is unremarkable. Normal stomach and small bowel. Thick rim calcified mass abuts the 3rd to 4th portion of the duodenum, unchanged, measuring 3.2 cm. Vascular/Lymphatic: Aortic atherosclerosis. No aneurysm. No enlarged lymph nodes. Reproductive: Unremarkable. Other: No abdominal wall hernia or abnormality. No abdominopelvic ascites. Musculoskeletal: Air extends along the superior margin of the T11 vertebra, just below the upper endplate, new compared to the prior CT. There is ill-defined increased attenuation in the retroperitoneal fat anterior to this vertebra and posterior to the diaphragmatic or a. this may reflect a recent fracture or could be infectious/inflammatory in etiology. No other evidence of a fracture.  No suspicious bone lesion. IMPRESSION: 1. No evidence of a ureteral stone or obstructive uropathy. 2. Bilateral nonobstructing intrarenal stones. Diffuse renal cortical thinning consistent with medical renal disease. These findings are stable. 3. Questionable minimal recent fracture of the upper endplate of T11 versus inflammation/infection at this level reflected by increased attenuation in the adjacent fat. These findings could be further assessed and characterized with MRI without and with contrast. 4. Aortic atherosclerosis. 5. Stable, 3.2 cm mass adjacent to the duodenum with a thick rim of peripheral calcification. Etiology of this is unclear. It does not appear to arise from a vessel although an aneurysm remains possible. Electronically Signed   By: Amie Portland M.D.   On: 07/30/2021 15:00        Scheduled Meds:  acetaminophen  500 mg Oral TID   apixaban  2.5 mg Oral BID   carvedilol  6.25 mg Oral BID WC   cholecalciferol  2,000 Units Oral Daily   cyanocobalamin  1,000 mcg Subcutaneous Daily   fluticasone  1 spray Each Nare Daily   gabapentin  300 mg Oral QHS    insulin aspart  0-5 Units Subcutaneous QHS   insulin aspart  0-9 Units Subcutaneous TID WC   latanoprost  1 drop Both Eyes QHS   meclizine  25 mg Oral BID   mupirocin cream   Topical BID   senna-docusate  1 tablet Oral BID   tamsulosin  0.4 mg Oral Daily   zolpidem  5 mg Oral QHS   Continuous Infusions:  sodium chloride 100 mL/hr at 08/01/21 0335     LOS: 0 days  Time spent: 40 minutes    Ramiro Harvest, MD [facility]   To contact the attending provider between 7A-7P or the covering provider during after hours 7P-7A, please log into the web site [email or web address] and access using universal [place] password for that web site. If you do not have the password, please call the hospital operator.  08/01/2021, 12:41 PM

## 2021-08-01 NOTE — Progress Notes (Signed)
8889- Pt lying in bed in nad. Pt is very HOH. Voices no complaints at this time. See assessment for more information. Call bell and needs within reach. Will monitor.  1200- CBG 149mg /dl. SSI administered per orders.  1800- Son, , present with Dr. French Ana discussing d/c plans for tomorrow. Pt is in nad. Will continue to monitor.

## 2021-08-02 DIAGNOSIS — N189 Chronic kidney disease, unspecified: Secondary | ICD-10-CM

## 2021-08-02 LAB — BASIC METABOLIC PANEL
Anion gap: 7 (ref 5–15)
BUN: 19 mg/dL (ref 8–23)
CO2: 21 mmol/L — ABNORMAL LOW (ref 22–32)
Calcium: 8.2 mg/dL — ABNORMAL LOW (ref 8.9–10.3)
Chloride: 109 mmol/L (ref 98–111)
Creatinine, Ser: 1.34 mg/dL — ABNORMAL HIGH (ref 0.61–1.24)
GFR, Estimated: 51 mL/min — ABNORMAL LOW (ref >60)
Glucose, Bld: 112 mg/dL — ABNORMAL HIGH (ref 70–99)
Potassium: 3.7 mmol/L (ref 3.5–5.1)
Sodium: 137 mmol/L (ref 135–145)

## 2021-08-02 LAB — GLUCOSE, CAPILLARY
Glucose-Capillary: 139 mg/dL — ABNORMAL HIGH (ref 70–99)
Glucose-Capillary: 113 mg/dL — ABNORMAL HIGH (ref 70–99)

## 2021-08-02 LAB — MAGNESIUM: Magnesium: 1.9 mg/dL (ref 1.7–2.4)

## 2021-08-02 MED ORDER — SENNOSIDES-DOCUSATE SODIUM 8.6-50 MG PO TABS: 1.0000 | ORAL_TABLET | Freq: Two times a day (BID) | ORAL | Status: DC

## 2021-08-02 MED ORDER — DOCUSATE SODIUM 100 MG PO CAPS: 200.0000 mg | ORAL_CAPSULE | Freq: Every day | ORAL | 0 refills | Status: DC | PRN

## 2021-08-02 MED ORDER — ACETAMINOPHEN 500 MG PO TABS: 500.0000 mg | ORAL_TABLET | Freq: Three times a day (TID) | ORAL | 0 refills | Status: AC

## 2021-08-02 MED ORDER — VITAMIN B-12 1000 MCG PO TABS: 1000.0000 ug | ORAL_TABLET | Freq: Every day | ORAL | Status: DC

## 2021-08-02 MED ORDER — CARVEDILOL 6.25 MG PO TABS: 6.2500 mg | ORAL_TABLET | Freq: Two times a day (BID) | ORAL | 1 refills | Status: AC

## 2021-08-02 NOTE — Progress Notes (Signed)
PT Cancellation Note  Patient Details Name: Charles Richard MRN: [id number] DOB: 16-Sep-1930   Cancelled Treatment:    Reason Eval/Treat Not Completed:  Pt politely declined. He would like to rest-will d/c later today    Faye Ramsay, PT Acute Rehabilitation  Office: [phone] Pager: [phone]

## 2021-08-02 NOTE — TOC Progression Note (Signed)
Transition of Care [facility]) - Progression Note    Patient Details  Name: ZION TA MRN: [id number] Date of Birth: 1930/12/06  Transition of Care [facility] CM/SW Contact  Geni Bers, RN Phone Number: 08/02/2021, 12:45 PM  Clinical Narrative:     Spoke with pt's daughter [name] concerning HH. [facility] was selected. Referral given to in house rep.   Expected Discharge Plan: Home w Home Health Services Barriers to Discharge: No Barriers Identified  Expected Discharge Plan and Services Expected Discharge Plan: Home w Home Health Services       Living arrangements for the past 2 months: Single Family Home                                       Social Determinants of Health (SDOH) Interventions    Readmission Risk Interventions No flowsheet data found.

## 2021-08-02 NOTE — Discharge Summary (Signed)
Physician Discharge Summary  Charles Richard ZOX:[id number] DOB: [birth year] DOA: 07/30/2021  PCP: [facility]  Admit date: 07/30/2021 Discharge date: 08/02/2021  Time spent: 50 minutes  Recommendations for Outpatient Follow-up:  Follow[facility] with [facility] in 2 weeks or as previously scheduled.  On follow-up patient will need a basic metabolic profile, magnesium level checked to follow-up on electrolytes.  Patient's vitamin B12 deficiency will need to be followed up upon.  Patient's ACE inhibitor was discontinued and blood pressure need to be reassessed on follow-up.  T11 compression fracture need to be followed up upon. Follow-up with Dr. Campbell Lerner PA/Caitlin Dan Humphreys, PA on 09/02/2021 at 1:30 PM.   Discharge Diagnoses:  Principal Problem:   Acute renal failure superimposed on stage 3a chronic kidney disease (HCC) Active Problems:   Wide-complex tachycardia (HCC)   Essential hypertension   Type 2 diabetes mellitus without complication (HCC)   AAA (abdominal aortic aneurysm) without rupture (HCC)   COVID-19 virus infection   Permanent atrial fibrillation (HCC)   Closed T11 fracture (HCC)   Dehydration   Acute kidney injury superimposed on chronic kidney disease (HCC)   Discharge Condition: Stable and improved.  Diet recommendation: Heart healthy  Filed Weights   07/30/21 1355 07/30/21 2205 08/01/21 0500  Weight: 97.5 kg 104.5 kg 103.9 kg    History of present illness:  HPI per Dr Evlyn Courier is a 87 y.o. male with medical history significant of permanent atrial fibrillation, CKD stage IIIa, recurrent kidney stones, AAA, type 2 diabetes mellitus, hyperlipidemia, hypertension, hard of hearing and multiple other problems was brought in to the ED by daughter with multiple complaints.  History was provided by the daughter since patient himself is extremely hard of hearing.  He lives with the daughter as well.  According to daughter, patient fell  about 8 days ago/last Friday at night.  Since then, he has been complaining of back pain and bilateral flank pain and has passed several kidney stones, according to daughter they were big in size.  He has been having intermittent nausea as well as vomiting.  Has not been eating much due to the fear of vomiting.  His last bowel movement was 7 days ago.  Since he continued to get weaker and weaker, she decided to bring him to the ED.  No history of fever, chills, sweating, chest pain, shortness of breath, any urinary complaints, any neurological complaint, any recent travel or sick contact.   ED Course: Upon arrival to ED, he was hemodynamically stable.  CMP showed mild hyperkalemia, slightly elevated creatinine than his baseline.  CBC showed baseline anemia.  No leukocytosis.  UA unremarkable.  Lipase normal.  CT renal stone did not show any obstructive ureteral stone or obstructive uropathy, he has bilateral nonobstructing intrarenal stones.  There was some question of T11 fracture versus inflammation/infection.  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  Hospital service were consulted for further management of dehydration, AKI on CKD.  Hospital Course:  1 acute kidney injury on chronic kidney disease stage IIIa -Likely secondary to prerenal azotemia in the setting of ACE inhibitor. -Patient presented with nausea vomiting, decreased oral intake.  Patient also with complaints of constipation. -Creatinine noted at 2.04 on admission with prior creatinine of 1.25 on 05/09/2020. -Urinalysis nitrite negative, small leukocytes, 0-5 WBCs. -Patient placed on IV fluids with improvement with renal function such that creatinine was down to 1.34 by day of discharge.  -Patient's ACE inhibitor was discontinued during  the hospitalization and will not be resumed on discharge.   -Outpatient follow-up with PCP.    2.  Wide-complex tachycardia/nonsustained V. tach -Patient noted to have a 31 beat run of  WCT/NSVT on 07/31/2021.. -Patient asymptomatic. -No further bouts of wide-complex tachycardia. -TSH within normal limits.   -2D echo with EF of 55 to 60%, NWMA, moderate LVH, normal right ventricular systolic function.  -Patient's hyperkalemia was corrected and goal was to keep potassium approximately at 4 and magnesium approximately at 2.  -Patient's Coreg dose was adjusted to 6.25 mg twice daily.   -Patient maintained on Eliquis for anticoagulation  -Patient seen in consultation by cardiology with no further recommendations and recommended outpatient follow-up.  3.  Permanent A. fib -Patient's Coreg dose was adjusted to 6.25 mg twice daily for rate control.   -Patient maintained on Eliquis for anticoagulation.   -Patient seen in consultation by cardiology and will follow up with cardiology in the outpatient setting.   4.  Dehydration -Hydrated with IV fluids and was euvolemic by day of discharge.  5.  Hyperkalemia -Likely secondary to acute kidney injury -Patient received Kayexalate and Lokelma during the hospitalization with resolution of hyperkalemia.   -ACE inhibitor was discontinued.   -Potassium was 3.7 by day of discharge.   -Outpatient follow-up with PCP.  6.  Anemia of chronic disease/vitamin B12 deficiency -Patient with anemia with no overt bleeding. -Likely dilutional component. -Anemia panel with iron of 61, TIBC of 212, ferritin of 110, vitamin B12 of 176. -Hemoglobin stabilized at 9.9 by day of discharge.   -Patient noted to have a vitamin B12 deficiency and placed on vitamin B12 supplementation.   -Patient be discharged on oral vitamin B12 supplementation with outpatient follow-up with PCP.    7.  COVID-19 infection -Patient not hypoxic with sats of 97% on room air. -Patient did not meet indication for treatment.   -Patient maintained on the supportive care.   -Patient was discharged home to quarantine until 08/10/2021.   -Outpatient follow-up with PCP.    8.   Diabetes mellitus type 2 -Hemoglobin A1c 6.4 (07/30/2021). -Patient's oral hypoglycemic agents were held during the hospitalization and patient maintained on SSI.   -Outpatient follow-up with PCP.  9.  Hypertension -Controlled on Coreg.   -ACE inhibitor held during the hospitalization and will not be resumed on discharge.     #10: Hyperlipidemia -Patient maintained on statin.  11.  Hypomagnesemia Magnesium repleted.   -Outpatient follow-up with PCP.   12.  T11 compression fracture -MRI showing T11 compression fracture extending through posterior and superior walls with associated prevertebral soft tissue edema and enhancement with no height loss.  Appearance consistent with subacute fracture with early formation of osteonecrotic cleft.  Presence of gas at the fracture site argues against infection. -Multiple small disc protrusions with narrowing of ventral thecal sac at T7-8.  No spinal canal stenosis or nerve root impingement. -Patient with no complaints of significant excruciating pain. -PT/OT. -Patient maintained on scheduled Tylenol and Ultram as needed. -Outpatient follow-up with PCP.      Procedures: CT renal stone protocol 07/30/2021 MRI T-spine 07/30/2021 2D echo 07/31/2021    Consultations: Cardiology: Dr. Bjorn Pippin 07/31/2021  Discharge Exam: Vitals:   08/02/21 0613 08/02/21 1503  BP: 130/78 140/82  Pulse: 94 77  Resp: (!) 21 16  Temp: 98.3 F (36.8 C) 98.8 F (37.1 C)  SpO2: 94% 100%    General: NAD Cardiovascular: RRR No m/r/g Respiratory: CTAB  Discharge Instructions   Discharge Instructions  Diet - low sodium heart healthy   Complete by: As directed    Discharge instructions   Complete by: As directed    Please quarantine until 08/10/2021  ?   Person Under Monitoring Name: Charles Richard  Location: [address] [place] [address]   Infection Prevention Recommendations for Individuals Confirmed to have, or Being Evaluated for,  2019 Novel Coronavirus (COVID-19) Infection Who Receive Care at Home  Individuals who are confirmed to have, or are being evaluated for, COVID-19 should follow the prevention steps below until a healthcare provider or local or state health department says they can return to normal activities.  Stay home except to get medical care You should restrict activities outside your home, except for getting medical care. Do not go to work, school, or public areas, and do not use public transportation or taxis.  Call ahead before visiting your doctor Before your medical appointment, call the healthcare provider and tell them that you have, or are being evaluated for, COVID-19 infection. This will help the healthcare provider's office take steps to keep other people from getting infected. Ask your healthcare provider to call the local or state health department.  Monitor your symptoms Seek prompt medical attention if your illness is worsening (e.g., difficulty breathing). Before going to your medical appointment, call the healthcare provider and tell them that you have, or are being evaluated for, COVID-19 infection. Ask your healthcare provider to call the local or state health department.  Wear a facemask You should wear a facemask that covers your nose and mouth when you are in the same room with other people and when you visit a healthcare provider. People who live with or visit you should also wear a facemask while they are in the same room with you.  Separate yourself from other people in your home As much as possible, you should stay in a different room from other people in your home. Also, you should use a separate bathroom, if available.  Avoid sharing household items You should not share dishes, drinking glasses, cups, eating utensils, towels, bedding, or other items with other people in your home. After using these items, you should wash them thoroughly with soap and water.  Cover  your coughs and sneezes Cover your mouth and nose with a tissue when you cough or sneeze, or you can cough or sneeze into your sleeve. Throw used tissues in a lined trash can, and immediately wash your hands with soap and water for at least 20 seconds or use an alcohol-based hand rub.  Wash your [employer] your hands often and thoroughly with soap and water for at least 20 seconds. You can use an alcohol-based hand sanitizer if soap and water are not available and if your hands are not visibly dirty. Avoid touching your eyes, nose, and mouth with unwashed hands.   Prevention Steps for Caregivers and Household Members of Individuals Confirmed to have, or Being Evaluated for, COVID-19 Infection Being Cared for in the Home  If you live with, or provide care at home for, a person confirmed to have, or being evaluated for, COVID-19 infection please follow these guidelines to prevent infection:  Follow healthcare provider's instructions Make sure that you understand and can help the patient follow any healthcare provider instructions for all care.  Provide for the patient's basic needs You should help the patient with basic needs in the home and provide support for getting groceries, prescriptions, and other personal needs.  Monitor the patient's  symptoms If they are getting sicker, call his or her medical provider and tell them that the patient has, or is being evaluated for, COVID-19 infection. This will help the healthcare provider's office take steps to keep other people from getting infected. Ask the healthcare provider to call the local or state health department.  Limit the number of people who have contact with the patient If possible, have only one caregiver for the patient. Other household members should stay in another home or place of residence. If this is not possible, they should stay in another room, or be separated from the patient as much as possible. Use a separate  bathroom, if available. Restrict visitors who do not have an essential need to be in the home.  Keep older adults, very young children, and other sick people away from the patient Keep older adults, very young children, and those who have compromised immune systems or chronic health conditions away from the patient. This includes people with chronic heart, lung, or kidney conditions, diabetes, and cancer.  Ensure good ventilation Make sure that shared spaces in the home have good air flow, such as from an air conditioner or an opened window, weather permitting.  Wash your hands often Wash your hands often and thoroughly with soap and water for at least 20 seconds. You can use an alcohol based hand sanitizer if soap and water are not available and if your hands are not visibly dirty. Avoid touching your eyes, nose, and mouth with unwashed hands. Use disposable paper towels to dry your hands. If not available, use dedicated cloth towels and replace them when they become wet.  Wear a facemask and gloves Wear a disposable facemask at all times in the room and gloves when you touch or have contact with the patient's blood, body fluids, and/or secretions or excretions, such as sweat, saliva, sputum, nasal mucus, vomit, urine, or feces.  Ensure the mask fits over your nose and mouth tightly, and do not touch it during use. Throw out disposable facemasks and gloves after using them. Do not reuse. Wash your hands immediately after removing your facemask and gloves. If your personal clothing becomes contaminated, carefully remove clothing and launder. Wash your hands after handling contaminated clothing. Place all used disposable facemasks, gloves, and other waste in a lined container before disposing them with other household waste. Remove gloves and wash your hands immediately after handling these items.  Do not share dishes, glasses, or other household items with the patient Avoid sharing household  items. You should not share dishes, drinking glasses, cups, eating utensils, towels, bedding, or other items with a patient who is confirmed to have, or being evaluated for, COVID-19 infection. After the person uses these items, you should wash them thoroughly with soap and water.  Wash laundry thoroughly Immediately remove and wash clothes or bedding that have blood, body fluids, and/or secretions or excretions, such as sweat, saliva, sputum, nasal mucus, vomit, urine, or feces, on them. Wear gloves when handling laundry from the patient. Read and follow directions on labels of laundry or clothing items and detergent. In general, wash and dry with the warmest temperatures recommended on the label.  Clean all areas the individual has used often Clean all touchable surfaces, such as counters, tabletops, doorknobs, bathroom fixtures, toilets, phones, keyboards, tablets, and bedside tables, every day. Also, clean any surfaces that may have blood, body fluids, and/or secretions or excretions on them. Wear gloves when cleaning surfaces the patient has come in contact  with. Use a diluted bleach solution (e.g., dilute bleach with 1 part bleach and 10 parts water) or a household disinfectant with a label that says EPA-registered for coronaviruses. To make a bleach solution at home, add 1 tablespoon of bleach to 1 quart (4 cups) of water. For a larger supply, add  cup of bleach to 1 gallon (16 cups) of water. Read labels of cleaning products and follow recommendations provided on product labels. Labels contain instructions for safe and effective use of the cleaning product including precautions you should take when applying the product, such as wearing gloves or eye protection and making sure you have good ventilation during use of the product. Remove gloves and wash hands immediately after cleaning.  Monitor yourself for signs and symptoms of illness Caregivers and household members are considered close  contacts, should monitor their health, and will be asked to limit movement outside of the home to the extent possible. Follow the monitoring steps for close contacts listed on the symptom monitoring form.   ? If you have additional questions, contact your local health department or call the epidemiologist on call at [phone] (available 24/7). ? This guidance is subject to change. For the most up-to-date guidance from [facility], please refer to their website: [email or web address]   Discharge wound care:   Complete by: As directed    As above   Discharge wound care:   Complete by: As directed    As above   Increase activity slowly   Complete by: As directed       Allergies as of 08/02/2021   No Known Allergies      Medication List     STOP taking these medications    docusate sodium 100 MG capsule Commonly known as: COLACE   lisinopril 40 MG tablet Commonly known as: ZESTRIL       TAKE these medications    acetaminophen 500 MG tablet Commonly known as: TYLENOL Take 1 tablet (500 mg total) by mouth 3 (three) times daily for 7 days.   apixaban 5 MG Tabs tablet Commonly known as: ELIQUIS Take 2.5 mg by mouth 2 (two) times daily.   carvedilol 6.25 MG tablet Commonly known as: COREG Take 1 tablet (6.25 mg total) by mouth 2 (two) times daily with a meal. What changed:  how much to take when to take this   fluticasone 50 MCG/ACT nasal spray Commonly known as: FLONASE Place 1 spray into both nostrils daily as needed for allergies.   gabapentin 300 MG capsule Commonly known as: NEURONTIN Take 300 mg by mouth at bedtime.   latanoprost 0.005 % ophthalmic solution Commonly known as: XALATAN Place 1 drop into both eyes at bedtime.   loratadine 10 MG tablet Commonly known as: CLARITIN Take 10 mg by mouth daily with breakfast.   meclizine 25 MG tablet Commonly known as: ANTIVERT Take 25 mg by mouth daily.    metFORMIN 500 MG tablet Commonly known as: GLUCOPHAGE Take 500 mg by mouth 2 (two) times daily with a meal.   multivitamin with minerals Tabs tablet Take 1 tablet by mouth daily.   ondansetron 8 MG tablet Commonly known as: ZOFRAN Take 4 mg by mouth every 12 (twelve) hours as needed for nausea or vomiting.   Propylene Glycol 0.6 % Soln Place 1-2 drops into both eyes daily as needed (for dry eyes).   senna-docusate 8.6-50 MG tablet Commonly known as: Senokot-S Take 1 tablet by mouth 2 (two) times daily.   simvastatin 20 MG tablet  Commonly known as: ZOCOR Take 10 mg by mouth daily.   tamsulosin 0.4 MG Caps capsule Commonly known as: FLOMAX Take 0.4 mg by mouth daily.   traMADol 50 MG tablet Commonly known as: ULTRAM Take 50-100 mg by mouth at bedtime as needed for moderate pain.   vitamin B-12 1000 MCG tablet Commonly known as: CYANOCOBALAMIN Take 1 tablet (1,000 mcg total) by mouth daily.   Vitamin D 50 MCG (2000 UT) tablet Take 2,000 Units by mouth daily.   zolpidem 10 MG tablet Commonly known as: AMBIEN Take 10 mg by mouth at bedtime.               Discharge Care Instructions  (From admission, onward)           Start     Ordered   08/02/21 0000  Discharge wound care:       Comments: As above   08/02/21 1725   08/02/21 0000  Discharge wound care:       Comments: As above   08/02/21 1727           No Known Allergies  Follow-up Information     [facility] Follow up on 09/02/2021.   Specialty: Cardiology Why: :30pm for hospital follow up with Dr. Campbell Lerner PA/NA Gillian Shields Contact information: [address] [place] [address] [phone]        [facility] Follow [facility].   Specialty: Home Health Services Why: [facility] will follow you at home for Home Health. Pleas call [phone] for any questions of concerns.        [facility]          [facility] Schedule an appointment as soon as possible for a visit in 2 week(s).   Why: f/u as scheduled or in 2 weeks. Contact information: [address] [facility] [address] [place] [place] [address] [phone]                  The results of significant diagnostics from this hospitalization (including imaging, microbiology, ancillary and laboratory) are listed below for reference.    Significant Diagnostic Studies: MR THORACIC SPINE W WO CONTRAST  Result Date: 07/30/2021 CLINICAL DATA:  Fall 1 week ago EXAM: MRI THORACIC WITHOUT AND WITH CONTRAST TECHNIQUE: Multiplanar and multiecho pulse sequences of the thoracic spine were obtained without and with intravenous contrast. CONTRAST:  10mL GADAVIST GADOBUTROL 1 MMOL/ML IV SOLN COMPARISON:  None. FINDINGS: Alignment:  Physiologic. Vertebrae: Fracture at T11 extends through the posterior and superior walls. No height loss. On the concomitant CT, there is gas within the fracture cleft. Cord:  Normal Paraspinal and other soft tissues: Prevertebral soft tissue edema with enhancement at the T10-11 levels. Disc levels: There are multiple small central disc protrusions with narrowing of the ventral thecal sac at T7-8. There is no spinal canal stenosis. No nerve root impingement. IMPRESSION: 1. T11 fracture extending through the posterior and superior walls, with associated prevertebral soft tissue edema and enhancement. No height loss. The appearance is consistent with a subacute fracture and early formation of osteonecrotic cleft. The presence of gas at the fracture site argues against infection. 2. Multiple small central disc protrusions with narrowing of the ventral thecal sac at T7-8. No spinal canal stenosis or nerve root impingement. Electronically Signed   By: Deatra Robinson M.D.   On: 07/30/2021 19:09   ECHOCARDIOGRAM COMPLETE  Result Date: 07/31/2021    ECHOCARDIOGRAM REPORT  Patient Name:   Charles Richard Date of Exam: 07/31/2021 Medical Rec #:  [id number]        Height:       72.0 in Accession #:    [id number]       Weight:       230.4 lb Date of Birth:  07-02-1930       BSA:          2.263 m Patient Age:    87 years         BP:           137/77 mmHg Patient Gender: M                HR:           91 bpm. Exam Location:  Inpatient Procedure: 2D Echo, Cardiac Doppler and Color Doppler Indications:    I47.2 Ventricular tachycardia  History:        Patient has prior history of Echocardiogram examinations, most                 recent 05/09/2017. Previous Myocardial Infarction, Stroke,                 Arrythmias:Atrial Fibrillation; Risk Factors:Hypertension,                 Diabetes and Dyslipidemia. COVID-19 positive.  Sonographer:    Tiffany Dance RVT Referring Phys: [id number] Autym Siess IMPRESSIONS  1. Left ventricular ejection fraction, by estimation, is 55 to 60%. The left ventricle has normal function. The left ventricle has no regional wall motion abnormalities. There is moderate left ventricular hypertrophy. Left ventricular diastolic parameters are indeterminate.  2. Right ventricular systolic function is normal. The right ventricular size is normal. There is normal pulmonary artery systolic pressure. The estimated right ventricular systolic pressure is 19.6 mmHg.  3. Left atrial size was mildly dilated.  4. The mitral valve is grossly normal. Trivial mitral valve regurgitation.  5. The aortic valve is tricuspid. Aortic valve regurgitation is not visualized. Mild aortic valve sclerosis is present, with no evidence of aortic valve stenosis.  6. The inferior vena cava is normal in size with greater than 50% respiratory variability, suggesting right atrial pressure of 3 mmHg. Comparison(s): No prior Echocardiogram. FINDINGS  Left Ventricle: Left ventricular ejection fraction, by estimation, is 55 to 60%. The left ventricle has normal function. The left ventricle has no regional wall motion abnormalities.  The left ventricular internal cavity size was normal in size. There is  moderate left ventricular hypertrophy. Left ventricular diastolic parameters are indeterminate. Right Ventricle: The right ventricular size is normal. No increase in right ventricular wall thickness. Right ventricular systolic function is normal. There is normal pulmonary artery systolic pressure. The tricuspid regurgitant velocity is 2.04 m/s, and  with an assumed right atrial pressure of 3 mmHg, the estimated right ventricular systolic pressure is 19.6 mmHg. Left Atrium: Left atrial size was mildly dilated. Right Atrium: Right atrial size was normal in size. Pericardium: There is no evidence of pericardial effusion. Presence of pericardial fat pad. Mitral Valve: The mitral valve is grossly normal. Trivial mitral valve regurgitation. Tricuspid Valve: The tricuspid valve is grossly normal. Tricuspid valve regurgitation is trivial. Aortic Valve: The aortic valve is tricuspid. There is mild aortic valve annular calcification. Aortic valve regurgitation is not visualized. Mild aortic valve sclerosis is present, with no evidence of aortic valve stenosis. Pulmonic Valve: The pulmonic valve was not well visualized. Pulmonic valve regurgitation  is trivial. Aorta: The aortic root is normal in size and structure. Venous: The inferior vena cava is normal in size with greater than 50% respiratory variability, suggesting right atrial pressure of 3 mmHg. IAS/Shunts: No atrial level shunt detected by color flow Doppler.  LEFT VENTRICLE PLAX 2D LVIDd:         4.50 cm LVIDs:         3.30 cm LV PW:         1.40 cm LV IVS:        1.30 cm LVOT diam:     2.10 cm LV SV:         47 LV SV Index:   21 LVOT Area:     3.46 cm  RIGHT VENTRICLE            IVC RV Basal diam:  2.70 cm    IVC diam: 1.70 cm RV S prime:     8.81 cm/s TAPSE (M-mode): 1.2 cm LEFT ATRIUM             Index       RIGHT ATRIUM           Index LA diam:        4.00 cm 1.77 cm/m  RA Area:     14.00  cm LA Vol (A2C):   84.1 ml 37.17 ml/m RA Volume:   26.60 ml  11.76 ml/m LA Vol (A4C):   71.4 ml 31.56 ml/m LA Biplane Vol: 77.4 ml 34.21 ml/m  AORTIC VALVE LVOT Vmax:   68.05 cm/s LVOT Vmean:  43.250 cm/s LVOT VTI:    0.135 m  AORTA Ao Root diam: 3.50 cm Ao Asc diam:  3.40 cm MITRAL VALVE               TRICUSPID VALVE MV Area (PHT): 4.10 cm    TR Peak grad:   16.6 mmHg MV Decel Time: 185 msec    TR Vmax:        204.00 cm/s MV E velocity: 93.25 cm/s MV A velocity: 45.30 cm/s  SHUNTS MV E/A ratio:  2.06        Systemic VTI:  0.14 m                            Systemic Diam: 2.10 cm Nona DellSamuel Mcdowell MD Electronically signed by Nona DellSamuel Mcdowell MD Signature Date/Time: 07/31/2021/4:27:35 PM    Final    CT Renal Stone Study  Result Date: 07/30/2021 CLINICAL DATA:  Bilateral flank pain. History of kidney stone surgery. EXAM: CT ABDOMEN AND PELVIS WITHOUT CONTRAST TECHNIQUE: Multidetector CT imaging of the abdomen and pelvis was performed following the standard protocol without IV contrast. COMPARISON:  12/13/2020 FINDINGS: Lower chest: Small, right greater than left, pleural effusions. Mild dependent subsegmental atelectasis in the lower lobes. Hepatobiliary: No focal liver abnormality is seen. Status post cholecystectomy. No biliary dilatation. Pancreas: Unremarkable. No pancreatic ductal dilatation or surrounding inflammatory changes. Spleen: Normal in size without focal abnormality. Adrenals/Urinary Tract: No adrenal masses. Bilateral renal cortical thinning. 4.8 cm, medial upper pole right renal cyst. Exophytic lateral right kidney upper pole cyst. Small hyperattenuating focus along the wall of the medial upper pole cyst, 8 mm in size. These findings are stable. No other renal masses. Bilateral intrarenal stones, similar to the prior CT. No hydronephrosis. Ureters are normal in course and in caliber. No ureteral stones. Normal bladder. Stomach/Bowel: Subtle inflammatory type stranding in the fat lateral  to the  proximal descending colon. This is new from the prior CT. This portion of the colon is not well distended. No convincing wall thickening. Remainder of the colon is unremarkable. Normal stomach and small bowel. Thick rim calcified mass abuts the 3rd to 4th portion of the duodenum, unchanged, measuring 3.2 cm. Vascular/Lymphatic: Aortic atherosclerosis. No aneurysm. No enlarged lymph nodes. Reproductive: Unremarkable. Other: No abdominal wall hernia or abnormality. No abdominopelvic ascites. Musculoskeletal: Air extends along the superior margin of the T11 vertebra, just below the upper endplate, new compared to the prior CT. There is ill-defined increased attenuation in the retroperitoneal fat anterior to this vertebra and posterior to the diaphragmatic or a. this may reflect a recent fracture or could be infectious/inflammatory in etiology. No other evidence of a fracture.  No suspicious bone lesion. IMPRESSION: 1. No evidence of a ureteral stone or obstructive uropathy. 2. Bilateral nonobstructing intrarenal stones. Diffuse renal cortical thinning consistent with medical renal disease. These findings are stable. 3. Questionable minimal recent fracture of the upper endplate of T11 versus inflammation/infection at this level reflected by increased attenuation in the adjacent fat. These findings could be further assessed and characterized with MRI without and with contrast. 4. Aortic atherosclerosis. 5. Stable, 3.2 cm mass adjacent to the duodenum with a thick rim of peripheral calcification. Etiology of this is unclear. It does not appear to arise from a vessel although an aneurysm remains possible. Electronically Signed   By: Amie Portland M.D.   On: 07/30/2021 15:00    Microbiology: Recent Results (from the past 240 hour(s))  Resp Panel by RT-PCR (Flu A&B, Covid) Nasopharyngeal Swab     Status: Abnormal   Collection Time: 07/30/21  5:20 PM   Specimen: Nasopharyngeal Swab; Nasopharyngeal(NP) swabs in vial  transport medium  Result Value Ref Range Status   SARS Coronavirus 2 by RT PCR POSITIVE (A) NEGATIVE Final    Comment: CRITICAL POSITIVE DAT CALLED TO, READ BACK AND VERIFIED WITH: Bartholome Bill, RN (NOTE) SARS-CoV-2 target nucleic acids are DETECTED.  The SARS-CoV-2 RNA is generally detectable in upper respiratory specimens during the acute phase of infection. Positive results are indicative of the presence of the identified virus, but do not rule out bacterial infection or co-infection with other pathogens not detected by the test. Clinical correlation with patient history and other diagnostic information is necessary to determine patient infection status. The expected result is Negative.  Fact Sheet for Patients: BloggerCourse.com  Fact Sheet for Healthcare Providers: SeriousBroker.it  This test is not yet approved or cleared by the [place] FDA and  has been authorized for detection and/or diagnosis of SARS-CoV-2 by FDA under an Emergency Use Authorization (EUA).  This EUA will remain in effect (meaning this test can be used)  for the duration of  the COVID-19 declaration under Section 564(b)(1) of the Act, 21 U.S.C. section 360bbb-3(b)(1), unless the authorization is terminated or revoked sooner.     Influenza A by PCR NEGATIVE NEGATIVE Final   Influenza B by PCR NEGATIVE NEGATIVE Final    Comment: (NOTE) The Xpert Xpress SARS-CoV-2/FLU/RSV plus assay is intended as an aid in the diagnosis of influenza from Nasopharyngeal swab specimens and should not be used as a sole basis for treatment. Nasal washings and aspirates are unacceptable for Xpert Xpress SARS-CoV-2/FLU/RSV testing.  Fact Sheet for Patients: BloggerCourse.com  Fact Sheet for Healthcare Providers: SeriousBroker.it  This test is not yet approved or cleared by the [place] and has been authorized  for  detection and/or diagnosis of SARS-CoV-2 by FDA under an Emergency Use Authorization (EUA). This EUA will remain in effect (meaning this test can be used) for the duration of the COVID-19 declaration under Section 564(b)(1) of the Act, 21 U.S.C. section 360bbb-3(b)(1), unless the authorization is terminated or revoked.  Performed at [facility], [address], [place], [place] [address]      Labs: Basic Metabolic Panel: Recent Labs  Lab 07/30/21 1418 07/31/21 0332 07/31/21 1250 08/01/21 0353 08/02/21 0357  NA 135 139 135 139 137  K 5.4* 5.7* 5.6* 4.0 3.7  CL 103 109 105 110 109  CO2 21*  GLUCOSE 159* 102* 95 115* 112*  BUN 40* 36* 35* 28* 19  CREATININE 2.04* 1.87* 1.78* 1.51* 1.34*  CALCIUM 8.9 8.5* 8.2* 8.1* 8.2*  MG 2.0  --  1.8 1.6* 1.9  PHOS  --   --  3.0 3.4  --    Liver Function Tests: Recent Labs  Lab 07/30/21 1418 07/31/21 1250 08/01/21 0353  AST 17  --   --   ALT 15  --   --   ALKPHOS 39  --   --   BILITOT 0.6  --   --   PROT 6.4*  --   --   ALBUMIN 3.2* 2.7* 2.8*   Recent Labs  Lab 07/30/21 1418  LIPASE 30   No results for input(s): AMMONIA in the last 168 hours. CBC: Recent Labs  Lab 07/30/21 1418 07/31/21 0332 08/01/21 0353  WBC 9.2 8.7 8.2  NEUTROABS 6.8  --  5.2  HGB 11.5* 9.8* 9.9*  HCT 35.9* 30.3* 30.6*  MCV 100.3* 100.3* 100.0  PLT 251 226 242   Cardiac Enzymes: No results for input(s): CKTOTAL, CKMB, CKMBINDEX, TROPONINI in the last 168 hours. BNP: BNP (last 3 results) No results for input(s): BNP in the last 8760 hours.  ProBNP (last 3 results) No results for input(s): PROBNP in the last 8760 hours.  CBG: Recent Labs  Lab 08/01/21 1148 08/01/21 1715 08/01/21 2141 08/02/21 1131 08/02/21 1322  GLUCAP 149* 110* 126* 139* 113*       Signed:  Ramiro Harvest MD.  [facility] 08/02/2021, 5:41 PM

## 2021-09-02 ENCOUNTER — Ambulatory Visit (HOSPITAL_BASED_OUTPATIENT_CLINIC_OR_DEPARTMENT_OTHER): Payer: No Typology Code available for payment source | Admitting: Family

## 2021-11-13 ENCOUNTER — Ambulatory Visit
Admission: EM | Admit: 2021-11-13 | Discharge: 2021-11-13 | Disposition: A | Discharge status: Home / Self Care | Payer: No Typology Code available for payment source | Attending: Internal Medicine | Admitting: Internal Medicine

## 2021-11-13 DIAGNOSIS — L97521 Non-pressure chronic ulcer of other part of left foot limited to breakdown of skin: Secondary | ICD-10-CM

## 2021-11-13 MED ORDER — CEPHALEXIN 500 MG PO CAPS: 500.0000 mg | ORAL_CAPSULE | Freq: Four times a day (QID) | ORAL | 0 refills | Status: DC

## 2021-11-13 NOTE — Discharge Instructions (Signed)
You have been prescribed an antibiotic to treat right toe infection.  Please follow-up with primary care soon as possible.

## 2021-11-13 NOTE — ED Provider Notes (Signed)
[facility]    CSN: [id number] Arrival date & time: 11/13/21  0815      History   Chief Complaint Chief Complaint  Patient presents with   left great toe ulcer    HPI Charles Richard is a 87 y.o. male.   Patient presents with infected ulcer to left great toe that has worsened over the past week.  Patient is hard of hearing, so daughter provided most of patient's medical history.  Daughter reports that they have had intermittent issues with this left great toe over the past 5 years with multiple infections.  Last infection was in March 2022.  Daughter reports that an ulcer has worsened over the past week, but they have not been able to get in with PCP just yet.  She reports that cephalexin typically eradicates infection and symptoms of toe.  Patient denies any numbness or tingling today but does endorse some pain.  Reports that they have been treating with mupirocin ointment as well as doing daily dressing changes. Denies fevers.     Past Medical History:  Diagnosis Date   AAA (abdominal aortic aneurysm) without rupture 06/03/2017   Arthritis    Atrial fibrillation with rapid ventricular response ([place]) 04/2017   New diagnosis during admission for urosepsis secondary to left nephrolithiasis/ureteral stone with hydronephrosis   COVID-19    04/2020   Diabetes mellitus without complication ([place])    Dysrhythmia    a fib   Essential hypertension    Hard of hearing    History of CVA (cerebrovascular accident) without residual deficits 05/2009   Admitted with expressive aphasia & confusion following a neurocardiogenic syncope event (related to dehydration, AKD & BB related hypotension/bradycardia)   History of kidney stones    Hyperlipidemia    Left nephrolithiasis 04/2017   s/p L Ureteral stent for L hydronephrosis & UTI/Urosepsis.     Myocardial infarction ([place])    Sepsis ([place])    Skin ulcers of foot, bilateral ([place])    Stroke ([place])    mild stroke no residuals     Patient Active Problem List   Diagnosis Date Noted   Acute kidney injury superimposed on chronic kidney disease ([place]) 08/01/2021   Wide-complex tachycardia 07/31/2021   Permanent atrial fibrillation ([place]) 07/30/2021   Closed T11 fracture ([place]) 07/30/2021   Dehydration 07/30/2021   Pneumonia due to COVID-19 virus 05/06/2020   COVID-19 virus infection 05/05/2020   Closed fracture of right proximal humerus 07/20/2017   S/P reverse total shoulder arthroplasty, 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   AAA (abdominal aortic aneurysm) without rupture 06/03/2017   Preop cardiovascular exam 06/01/2017   Physical deconditioning    Ureteral stone with hydronephrosis 05/09/2017   Essential hypertension 05/09/2017   Type 2 diabetes mellitus without complication ([place]) [id number]   Hyponatremia 05/09/2017   Acute renal failure superimposed on stage 3a chronic kidney disease ([place]) 05/09/2017   Atrial fibrillation with RVR ([place]) 05/09/2017   Sepsis secondary to UTI ([place]) 05/09/2017    Past Surgical History:  Procedure Laterality Date   CHOLECYSTECTOMY     CYSTOSCOPY W/ URETERAL STENT PLACEMENT Left 05/09/2017   Procedure: CYSTOSCOPY WITH RETROGRADE PYELOGRAM/URETERAL STENT PLACEMENT;  Surgeon: Festus Aloe, MD;  Location: [facility];  Service: Urology;  Laterality: Left;   CYSTOSCOPY WITH RETROGRADE PYELOGRAM, URETEROSCOPY AND STENT PLACEMENT Left 06/08/2017   Procedure: CYSTOSCOPY WITH LEFT RETROGRADE PYELOGRAM, URETEROSCOPY HOLMIUM LASER LITHOTRIPSY BASKET EXTRACTION  AND STENT EXCHANGE;  Surgeon: Festus Aloe, MD;  Location: [facility];  Service: Urology;  Laterality: Left;   EYE SURGERY     right   HOLMIUM LASER APPLICATION Left 06/08/2017   Procedure: HOLMIUM LASER APPLICATION;  Surgeon: Jerilee Field, MD;  Location: [facility];  Service: Urology;  Laterality: Left;   LITHOTRIPSY     REVERSE SHOULDER ARTHROPLASTY Right 07/20/2017   Procedure: REVERSE RIGHT SHOULDER ARTHROPLASTY;  Surgeon: Yolonda Kida, MD;  Location: [facility] OR;  Service: Orthopedics;  Laterality: Right;  150 mins       Home Medications    Prior to Admission medications   Medication Sig Start Date End Date Taking? Authorizing Provider  cephALEXin (KEFLEX) 500 MG capsule Take 1 capsule (500 mg total) by mouth 4 (four) times daily. 11/13/21  Yes Sion Thane, Acie Fredrickson, FNP  apixaban (ELIQUIS) 5 MG TABS tablet Take 2.5 mg by mouth 2 (two) times daily.    [provider]  carvedilol (COREG) 6.25 MG tablet Take 1 tablet (6.25 mg total) by mouth 2 (two) times daily with a meal. 08/02/21   Rodolph Bong, MD  Cholecalciferol (VITAMIN D) 50 MCG (2000 UT) tablet Take 2,000 Units by mouth daily.    [provider]  fluticasone (FLONASE) 50 MCG/ACT nasal spray Place 1 spray into both nostrils daily as needed for allergies.    [provider]  gabapentin (NEURONTIN) 300 MG capsule Take 300 mg by mouth at bedtime.    [provider]  latanoprost (XALATAN) 0.005 % ophthalmic solution Place 1 drop into both eyes at bedtime.    [provider]  loratadine (CLARITIN) 10 MG tablet Take 10 mg by mouth daily with breakfast.     [provider]  meclizine (ANTIVERT) 25 MG tablet Take 25 mg by mouth daily.    [provider]  metFORMIN (GLUCOPHAGE) 500 MG tablet Take 500 mg by mouth 2 (two) times daily with a meal.    [provider]  Multiple Vitamin (MULTIVITAMIN WITH MINERALS) TABS tablet Take 1 tablet by mouth daily.    [provider]  ondansetron (ZOFRAN) 8 MG tablet Take 4 mg by mouth every 12 (twelve) hours as needed for nausea or vomiting.    [provider]  Propylene Glycol 0.6 % SOLN Place 1-2 drops into both eyes daily as needed (for dry eyes).    [provider]  senna-docusate (SENOKOT-S) 8.6-50 MG tablet Take 1 tablet by mouth 2 (two) times daily. 08/02/21   Rodolph Bong, MD  simvastatin (ZOCOR) 20 MG tablet Take 10 mg by mouth  daily.    [provider]  tamsulosin (FLOMAX) 0.4 MG CAPS capsule Take 0.4 mg by mouth daily.    [provider]  traMADol (ULTRAM) 50 MG tablet Take 50-100 mg by mouth at bedtime as needed for moderate pain.    [provider]  vitamin B-12 (CYANOCOBALAMIN) 1000 MCG tablet Take 1 tablet (1,000 mcg total) by mouth daily. 08/02/21   Rodolph Bong, MD  zolpidem (AMBIEN) 10 MG tablet Take 10 mg by mouth at bedtime.    [provider]    Family History Family History  Problem Relation Age of Onset   Lung cancer Father        black lung   CAD Neg Hx    Kidney Stones Neg Hx    Aneurysm Neg Hx     Social History Social History   Tobacco Use   Smoking status: Never   Smokeless tobacco: Never  Vaping Use   Vaping Use: Never used  Substance Use Topics   Alcohol use: No   Drug use: No     Allergies   Patient has no known allergies.   Review of Systems Review of Systems Per HPI  Physical Exam Triage Vital Signs ED Triage Vitals [11/13/21 0900]  Enc Vitals Group     BP (!) 163/96     Pulse Rate (!) 104     Resp 18     Temp 97.9 F (36.6 C)     Temp Source Oral     SpO2 95 %     Weight      Height      Head Circumference      Peak Flow      Pain Score 4     Pain Loc      Pain Edu?      Excl. in [place]?    No data found.  Updated Vital Signs BP (!) 163/96 (BP Location: Right Arm)   Pulse (!) 104   Temp 97.9 F (36.6 C) (Oral)   Resp 18   SpO2 95%   Visual Acuity Right Eye Distance:   Left Eye Distance:   Bilateral Distance:    Right Eye Near:   Left Eye Near:    Bilateral Near:     Physical Exam Constitutional:      General: He is not in acute distress.    Appearance: Normal appearance. He is not toxic-appearing or diaphoretic.  HENT:     Head: Normocephalic and atraumatic.  Eyes:     Extraocular Movements: Extraocular movements intact.     Conjunctiva/sclera: Conjunctivae normal.  Pulmonary:     Effort:  Pulmonary effort is normal.  Feet:     Left foot:     Skin integrity: Ulcer, skin breakdown, erythema and warmth present.     Toenail Condition: Left toenails are abnormally thick.     Comments: Patient has mild erythema and mild swelling noted to left great toe at the dorsal surface.  Patient also has approximately 1 cm in length linear ulcer present to mid left great toe with purulent drainage.  Bluish discoloration to proximal end of left great toe.  Capillary refill is normal.  Pedal pulses normal. Neurological:     General: No focal deficit present.     Mental Status: He is alert and oriented to person, place, and time. Mental status is at baseline.  Psychiatric:        Mood and Affect: Mood normal.        Behavior: Behavior normal.        Thought Content: Thought content normal.        Judgment: Judgment normal.     UC Treatments / Results  Labs (all labs ordered are listed, but only abnormal results are displayed) Labs Reviewed - No data to display  EKG   Radiology No results found.  Procedures Procedures (including critical care time)  Medications Ordered in UC Medications - No data to display  Initial Impression / Assessment and Plan / UC Course  I have reviewed the triage vital signs and the nursing notes.  Pertinent labs & imaging results that were available during my care of the patient were reviewed by me and considered in my medical decision making (see chart for details).     Suggested to patient and daughter that it would be best to have osteomyelitis ruled out due to appearance of toe on exam.  This will need to be done at the hospital  with further imaging such as CT or MRI as x-ray may not show osteomyelitis.  Patient and daughter refused to go to the hospital.  Risks associated with not going to the hospital were discussed with patient and daughter.  Patient and daughter were then offered an x-ray in urgent care but declined this as well.  Daughter reported  that antibiotic treatment with cephalexin has proven successful in the past.  Will treat with cephalexin antibiotic.  Advised daughter and patient to follow-up with PCP as soon as possible for further evaluation and management.  Risks associated with not doing imaging or going to the hospital were thoroughly discussed with patient and daughter.  They voiced understanding and wished to go forth with antibiotic treatment and PCP follow-up.  Discussed strict return precautions.  Patient and daughter verbalized understanding and were agreeable with plan. Final Clinical Impressions(s) / UC Diagnoses   Final diagnoses:  Skin ulcer of left great toe, limited to breakdown of skin [facility])     Discharge Instructions      You have been prescribed an antibiotic to treat right toe infection.  Please follow-up with primary care soon as possible.    ED Prescriptions     Medication Sig Dispense Auth. Provider   cephALEXin (KEFLEX) 500 MG capsule Take 1 capsule (500 mg total) by mouth 4 (four) times daily. 28 capsule [place], Michele Rockers, [place]      PDMP not reviewed this encounter.   Teodora Medici,  11/13/21 [phone]

## 2021-11-13 NOTE — ED Triage Notes (Signed)
Pt hard of hearing daughter at bedside giving most of the information. States they have been "fighting" with both great toe for over 5 years. [facility]. Daughter brought pt in for eval until they can be seen by [place]. Here for ulcer on left great toe.

## 2022-09-27 DIAGNOSIS — E785 Hyperlipidemia, unspecified: Secondary | ICD-10-CM | POA: Diagnosis present

## 2022-09-27 DIAGNOSIS — N184 Chronic kidney disease, stage 4 (severe): Secondary | ICD-10-CM | POA: Insufficient documentation

## 2022-09-27 DIAGNOSIS — E1140 Type 2 diabetes mellitus with diabetic neuropathy, unspecified: Secondary | ICD-10-CM | POA: Insufficient documentation

## 2023-01-23 DIAGNOSIS — H401131 Primary open-angle glaucoma, bilateral, mild stage: Secondary | ICD-10-CM | POA: Insufficient documentation

## 2024-01-05 ENCOUNTER — Encounter (HOSPITAL_COMMUNITY): Payer: Self-pay

## 2024-01-05 ENCOUNTER — Other Ambulatory Visit: Payer: Self-pay

## 2024-01-05 ENCOUNTER — Inpatient Hospital Stay (HOSPITAL_COMMUNITY): Payer: No Typology Code available for payment source | Admitting: Anesthesiology

## 2024-01-05 ENCOUNTER — Inpatient Hospital Stay (HOSPITAL_COMMUNITY)
Admission: EM | Admit: 2024-01-05 | Discharge: 2024-01-09 | DRG: 853 | Disposition: A | Discharge status: Home Health | Payer: No Typology Code available for payment source | Attending: Internal Medicine | Admitting: Internal Medicine

## 2024-01-05 ENCOUNTER — Emergency Department (HOSPITAL_COMMUNITY): Payer: No Typology Code available for payment source

## 2024-01-05 ENCOUNTER — Encounter (HOSPITAL_COMMUNITY)
Admission: EM | Disposition: A | Discharge status: Home Health | Payer: Self-pay | Source: Home / Self Care | Attending: Internal Medicine

## 2024-01-05 ENCOUNTER — Inpatient Hospital Stay (HOSPITAL_COMMUNITY): Payer: No Typology Code available for payment source

## 2024-01-05 DIAGNOSIS — F5104 Psychophysiologic insomnia: Secondary | ICD-10-CM | POA: Diagnosis present

## 2024-01-05 DIAGNOSIS — N200 Calculus of kidney: Principal | ICD-10-CM

## 2024-01-05 DIAGNOSIS — Z1152 Encounter for screening for COVID-19: Secondary | ICD-10-CM | POA: Diagnosis not present

## 2024-01-05 DIAGNOSIS — Z96611 Presence of right artificial shoulder joint: Secondary | ICD-10-CM | POA: Diagnosis present

## 2024-01-05 DIAGNOSIS — N2889 Other specified disorders of kidney and ureter: Secondary | ICD-10-CM | POA: Diagnosis present

## 2024-01-05 DIAGNOSIS — Z8616 Personal history of COVID-19: Secondary | ICD-10-CM

## 2024-01-05 DIAGNOSIS — Z7984 Long term (current) use of oral hypoglycemic drugs: Secondary | ICD-10-CM | POA: Diagnosis not present

## 2024-01-05 DIAGNOSIS — E785 Hyperlipidemia, unspecified: Secondary | ICD-10-CM | POA: Diagnosis present

## 2024-01-05 DIAGNOSIS — G9341 Metabolic encephalopathy: Secondary | ICD-10-CM | POA: Diagnosis not present

## 2024-01-05 DIAGNOSIS — Z7901 Long term (current) use of anticoagulants: Secondary | ICD-10-CM | POA: Diagnosis not present

## 2024-01-05 DIAGNOSIS — I129 Hypertensive chronic kidney disease with stage 1 through stage 4 chronic kidney disease, or unspecified chronic kidney disease: Secondary | ICD-10-CM | POA: Diagnosis present

## 2024-01-05 DIAGNOSIS — I4821 Permanent atrial fibrillation: Secondary | ICD-10-CM | POA: Diagnosis present

## 2024-01-05 DIAGNOSIS — N179 Acute kidney failure, unspecified: Secondary | ICD-10-CM | POA: Diagnosis present

## 2024-01-05 DIAGNOSIS — N1832 Chronic kidney disease, stage 3b: Secondary | ICD-10-CM | POA: Diagnosis present

## 2024-01-05 DIAGNOSIS — I2510 Atherosclerotic heart disease of native coronary artery without angina pectoris: Secondary | ICD-10-CM | POA: Diagnosis present

## 2024-01-05 DIAGNOSIS — Z66 Do not resuscitate: Secondary | ICD-10-CM | POA: Diagnosis present

## 2024-01-05 DIAGNOSIS — Z8679 Personal history of other diseases of the circulatory system: Secondary | ICD-10-CM

## 2024-01-05 DIAGNOSIS — E860 Dehydration: Secondary | ICD-10-CM | POA: Diagnosis present

## 2024-01-05 DIAGNOSIS — D631 Anemia in chronic kidney disease: Secondary | ICD-10-CM | POA: Diagnosis present

## 2024-01-05 DIAGNOSIS — I252 Old myocardial infarction: Secondary | ICD-10-CM

## 2024-01-05 DIAGNOSIS — E871 Hypo-osmolality and hyponatremia: Secondary | ICD-10-CM | POA: Diagnosis present

## 2024-01-05 DIAGNOSIS — F05 Delirium due to known physiological condition: Secondary | ICD-10-CM | POA: Diagnosis not present

## 2024-01-05 DIAGNOSIS — N184 Chronic kidney disease, stage 4 (severe): Secondary | ICD-10-CM

## 2024-01-05 DIAGNOSIS — I714 Abdominal aortic aneurysm, without rupture, unspecified: Secondary | ICD-10-CM | POA: Diagnosis present

## 2024-01-05 DIAGNOSIS — E875 Hyperkalemia: Secondary | ICD-10-CM | POA: Diagnosis present

## 2024-01-05 DIAGNOSIS — N132 Hydronephrosis with renal and ureteral calculous obstruction: Secondary | ICD-10-CM | POA: Diagnosis present

## 2024-01-05 DIAGNOSIS — Z6834 Body mass index (BMI) 34.0-34.9, adult: Secondary | ICD-10-CM | POA: Diagnosis not present

## 2024-01-05 DIAGNOSIS — N136 Pyonephrosis: Secondary | ICD-10-CM | POA: Diagnosis present

## 2024-01-05 DIAGNOSIS — E1122 Type 2 diabetes mellitus with diabetic chronic kidney disease: Secondary | ICD-10-CM | POA: Diagnosis present

## 2024-01-05 DIAGNOSIS — A419 Sepsis, unspecified organism: Principal | ICD-10-CM

## 2024-01-05 DIAGNOSIS — Z79899 Other long term (current) drug therapy: Secondary | ICD-10-CM

## 2024-01-05 DIAGNOSIS — N390 Urinary tract infection, site not specified: Secondary | ICD-10-CM | POA: Diagnosis present

## 2024-01-05 DIAGNOSIS — Z8673 Personal history of transient ischemic attack (TIA), and cerebral infarction without residual deficits: Secondary | ICD-10-CM

## 2024-01-05 DIAGNOSIS — E43 Unspecified severe protein-calorie malnutrition: Secondary | ICD-10-CM | POA: Diagnosis present

## 2024-01-05 DIAGNOSIS — E66811 Obesity, class 1: Secondary | ICD-10-CM | POA: Diagnosis present

## 2024-01-05 DIAGNOSIS — H9190 Unspecified hearing loss, unspecified ear: Secondary | ICD-10-CM | POA: Diagnosis present

## 2024-01-05 DIAGNOSIS — R5381 Other malaise: Secondary | ICD-10-CM | POA: Diagnosis present

## 2024-01-05 DIAGNOSIS — L89629 Pressure ulcer of left heel, unspecified stage: Secondary | ICD-10-CM | POA: Insufficient documentation

## 2024-01-05 DIAGNOSIS — E119 Type 2 diabetes mellitus without complications: Secondary | ICD-10-CM

## 2024-01-05 DIAGNOSIS — N134 Hydroureter: Secondary | ICD-10-CM | POA: Diagnosis present

## 2024-01-05 DIAGNOSIS — I10 Essential (primary) hypertension: Secondary | ICD-10-CM | POA: Diagnosis present

## 2024-01-05 HISTORY — PX: CYSTOSCOPY W/ URETERAL STENT PLACEMENT: SHX1429

## 2024-01-05 LAB — I-STAT CHEM 8, ED
BUN: 83 mg/dL — ABNORMAL HIGH (ref 8–23)
BUN: 79 mg/dL — ABNORMAL HIGH (ref 8–23)
Calcium, Ion: 1.13 mmol/L — ABNORMAL LOW (ref 1.15–1.40)
Calcium, Ion: 1.19 mmol/L (ref 1.15–1.40)
Chloride: 107 mmol/L (ref 98–111)
Chloride: 107 mmol/L (ref 98–111)
Creatinine, Ser: 7.3 mg/dL — ABNORMAL HIGH (ref 0.61–1.24)
Creatinine, Ser: 6.9 mg/dL — ABNORMAL HIGH (ref 0.61–1.24)
Glucose, Bld: 105 mg/dL — ABNORMAL HIGH (ref 70–99)
Glucose, Bld: 125 mg/dL — ABNORMAL HIGH (ref 70–99)
HCT: 28 % — ABNORMAL LOW (ref 39.0–52.0)
HCT: 28 % — ABNORMAL LOW (ref 39.0–52.0)
Hemoglobin: 9.5 g/dL — ABNORMAL LOW (ref 13.0–17.0)
Hemoglobin: 9.5 g/dL — ABNORMAL LOW (ref 13.0–17.0)
Potassium: 5.9 mmol/L — ABNORMAL HIGH (ref 3.5–5.1)
Potassium: 5.2 mmol/L — ABNORMAL HIGH (ref 3.5–5.1)
Sodium: 132 mmol/L — ABNORMAL LOW (ref 135–145)
Sodium: 133 mmol/L — ABNORMAL LOW (ref 135–145)
TCO2: 17 mmol/L — ABNORMAL LOW (ref 22–32)
TCO2: 17 mmol/L — ABNORMAL LOW (ref 22–32)

## 2024-01-05 LAB — PROTIME-INR
INR: 1.7 — ABNORMAL HIGH (ref 0.8–1.2)
Prothrombin Time: 20.3 s — ABNORMAL HIGH (ref 11.4–15.2)

## 2024-01-05 LAB — RESP PANEL BY RT-PCR (RSV, FLU A&B, COVID)  RVPGX2
Influenza A by PCR: NEGATIVE
Influenza B by PCR: NEGATIVE
Resp Syncytial Virus by PCR: NEGATIVE
SARS Coronavirus 2 by RT PCR: NEGATIVE

## 2024-01-05 LAB — TYPE AND SCREEN
ABO/RH(D): A NEG
Antibody Screen: NEGATIVE

## 2024-01-05 LAB — BASIC METABOLIC PANEL
Anion gap: 11 (ref 5–15)
BUN: 78 mg/dL — ABNORMAL HIGH (ref 8–23)
CO2: 16 mmol/L — ABNORMAL LOW (ref 22–32)
Calcium: 8 mg/dL — ABNORMAL LOW (ref 8.9–10.3)
Chloride: 106 mmol/L (ref 98–111)
Creatinine, Ser: 6.68 mg/dL — ABNORMAL HIGH (ref 0.61–1.24)
GFR, Estimated: 7 mL/min — ABNORMAL LOW (ref >60)
Glucose, Bld: 111 mg/dL — ABNORMAL HIGH (ref 70–99)
Potassium: 5.9 mmol/L — ABNORMAL HIGH (ref 3.5–5.1)
Sodium: 133 mmol/L — ABNORMAL LOW (ref 135–145)

## 2024-01-05 LAB — COMPREHENSIVE METABOLIC PANEL
ALT: 14 U/L (ref 0–44)
AST: 26 U/L (ref 15–41)
Albumin: 2.4 g/dL — ABNORMAL LOW (ref 3.5–5.0)
Alkaline Phosphatase: 46 U/L (ref 38–126)
Anion gap: 13 (ref 5–15)
BUN: 76 mg/dL — ABNORMAL HIGH (ref 8–23)
CO2: 11 mmol/L — ABNORMAL LOW (ref 22–32)
Calcium: 7.7 mg/dL — ABNORMAL LOW (ref 8.9–10.3)
Chloride: 106 mmol/L (ref 98–111)
Creatinine, Ser: 6.63 mg/dL — ABNORMAL HIGH (ref 0.61–1.24)
GFR, Estimated: 7 mL/min — ABNORMAL LOW (ref >60)
Glucose, Bld: 91 mg/dL (ref 70–99)
Potassium: 6.9 mmol/L (ref 3.5–5.1)
Sodium: 130 mmol/L — ABNORMAL LOW (ref 135–145)
Total Bilirubin: 1.3 mg/dL — ABNORMAL HIGH (ref 0.0–1.2)
Total Protein: 5.8 g/dL — ABNORMAL LOW (ref 6.5–8.1)

## 2024-01-05 LAB — SODIUM, URINE, RANDOM: Sodium, Ur: 54 mmol/L

## 2024-01-05 LAB — CBC WITH DIFFERENTIAL/PLATELET
Abs Immature Granulocytes: 0.04 10*3/uL (ref 0.00–0.07)
Basophils Absolute: 0 10*3/uL (ref 0.0–0.1)
Basophils Relative: 0 %
Eosinophils Absolute: 0.1 10*3/uL (ref 0.0–0.5)
Eosinophils Relative: 1 %
HCT: 13.1 % — ABNORMAL LOW (ref 39.0–52.0)
Hemoglobin: 3.7 g/dL — CL (ref 13.0–17.0)
Immature Granulocytes: 1 %
Lymphocytes Relative: 10 %
Lymphs Abs: 0.6 10*3/uL — ABNORMAL LOW (ref 0.7–4.0)
MCH: 31.6 pg (ref 26.0–34.0)
MCHC: 28.2 g/dL — ABNORMAL LOW (ref 30.0–36.0)
MCV: 112 fL — ABNORMAL HIGH (ref 80.0–100.0)
Monocytes Absolute: 0.7 10*3/uL (ref 0.1–1.0)
Monocytes Relative: 13 %
Neutro Abs: 4.2 10*3/uL (ref 1.7–7.7)
Neutrophils Relative %: 75 %
Platelets: 84 10*3/uL — ABNORMAL LOW (ref 150–400)
RBC: 1.17 MIL/uL — ABNORMAL LOW (ref 4.22–5.81)
RDW: 12.6 % (ref 11.5–15.5)
WBC: 5.6 10*3/uL (ref 4.0–10.5)
nRBC: 0 % (ref 0.0–0.2)

## 2024-01-05 LAB — CBC
HCT: 32.8 % — ABNORMAL LOW (ref 39.0–52.0)
Hemoglobin: 10.4 g/dL — ABNORMAL LOW (ref 13.0–17.0)
MCH: 32.1 pg (ref 26.0–34.0)
MCHC: 31.7 g/dL (ref 30.0–36.0)
MCV: 101.2 fL — ABNORMAL HIGH (ref 80.0–100.0)
Platelets: 219 10*3/uL (ref 150–400)
RBC: 3.24 MIL/uL — ABNORMAL LOW (ref 4.22–5.81)
RDW: 12.5 % (ref 11.5–15.5)
WBC: 13.6 10*3/uL — ABNORMAL HIGH (ref 4.0–10.5)
nRBC: 0 % (ref 0.0–0.2)

## 2024-01-05 LAB — URINALYSIS, ROUTINE W REFLEX MICROSCOPIC
Bilirubin Urine: NEGATIVE
Glucose, UA: 50 mg/dL — AB
Ketones, ur: NEGATIVE mg/dL
Leukocytes,Ua: NEGATIVE
Nitrite: NEGATIVE
Protein, ur: NEGATIVE mg/dL
Specific Gravity, Urine: 1.008 (ref 1.005–1.030)
pH: 5 (ref 5.0–8.0)

## 2024-01-05 LAB — GLUCOSE, CAPILLARY
Glucose-Capillary: 110 mg/dL — ABNORMAL HIGH (ref 70–99)
Glucose-Capillary: 216 mg/dL — ABNORMAL HIGH (ref 70–99)

## 2024-01-05 LAB — CBG MONITORING, ED
Glucose-Capillary: 97 mg/dL (ref 70–99)
Glucose-Capillary: 122 mg/dL — ABNORMAL HIGH (ref 70–99)

## 2024-01-05 LAB — CREATININE, URINE, RANDOM: Creatinine, Urine: 53 mg/dL

## 2024-01-05 LAB — POC OCCULT BLOOD, ED: Fecal Occult Bld: NEGATIVE

## 2024-01-05 LAB — GLUCOSE, RANDOM: Glucose, Bld: 131 mg/dL — ABNORMAL HIGH (ref 70–99)

## 2024-01-05 LAB — LIPASE, BLOOD: Lipase: 29 U/L (ref 11–51)

## 2024-01-05 LAB — I-STAT CG4 LACTIC ACID, ED
Lactic Acid, Venous: 0.9 mmol/L (ref 0.5–1.9)
Lactic Acid, Venous: 0.6 mmol/L (ref 0.5–1.9)

## 2024-01-05 LAB — APTT: aPTT: 35 s (ref 24–36)

## 2024-01-05 LAB — POTASSIUM
Potassium: 5.2 mmol/L — ABNORMAL HIGH (ref 3.5–5.1)
Potassium: 5.7 mmol/L — ABNORMAL HIGH (ref 3.5–5.1)
Potassium: 5.8 mmol/L — ABNORMAL HIGH (ref 3.5–5.1)

## 2024-01-05 SURGERY — CYSTOSCOPY, WITH RETROGRADE PYELOGRAM AND URETERAL STENT INSERTION
Anesthesia: General | Site: Ureter | Laterality: Left

## 2024-01-05 MED ORDER — LIDOCAINE HCL (PF) 2 % IJ SOLN
INTRAMUSCULAR | Status: AC
  Filled 2024-01-05: qty 5

## 2024-01-05 MED ORDER — LACTATED RINGERS IV BOLUS (SEPSIS)
1000.0000 mL | Freq: Once | INTRAVENOUS | Status: AC
  Administered 2024-01-05: 1000 mL via INTRAVENOUS

## 2024-01-05 MED ORDER — PHENYLEPHRINE HCL-NACL 20-0.9 MG/250ML-% IV SOLN
INTRAVENOUS | Status: DC | PRN
  Administered 2024-01-05: 40 ug/min via INTRAVENOUS

## 2024-01-05 MED ORDER — FENTANYL CITRATE (PF) 100 MCG/2ML IJ SOLN
INTRAMUSCULAR | Status: DC | PRN
  Administered 2024-01-05: 50 ug via INTRAVENOUS

## 2024-01-05 MED ORDER — CHLORHEXIDINE GLUCONATE CLOTH 2 % EX PADS
6.0000 | MEDICATED_PAD | Freq: Every day | CUTANEOUS | Status: DC
  Administered 2024-01-05 – 2024-01-09 (×5): 6 via TOPICAL

## 2024-01-05 MED ORDER — ONDANSETRON HCL 4 MG PO TABS: 4.0000 mg | ORAL_TABLET | Freq: Four times a day (QID) | ORAL | Status: DC | PRN

## 2024-01-05 MED ORDER — LACTATED RINGERS IV SOLN: INTRAVENOUS | Status: DC | PRN

## 2024-01-05 MED ORDER — DEXAMETHASONE SODIUM PHOSPHATE 10 MG/ML IJ SOLN
INTRAMUSCULAR | Status: AC
  Filled 2024-01-05: qty 1

## 2024-01-05 MED ORDER — SUGAMMADEX SODIUM 200 MG/2ML IV SOLN
INTRAVENOUS | Status: DC | PRN
  Administered 2024-01-05: 200 mg via INTRAVENOUS

## 2024-01-05 MED ORDER — CALCIUM GLUCONATE-NACL 1-0.675 GM/50ML-% IV SOLN
1.0000 g | Freq: Once | INTRAVENOUS | Status: AC
  Administered 2024-01-05: 1000 mg via INTRAVENOUS
  Filled 2024-01-05: qty 50

## 2024-01-05 MED ORDER — LORATADINE 10 MG PO TABS
10.0000 mg | ORAL_TABLET | Freq: Every day | ORAL | Status: DC
  Administered 2024-01-06 – 2024-01-09 (×4): 10 mg via ORAL
  Filled 2024-01-05 (×4): qty 1

## 2024-01-05 MED ORDER — ALBUTEROL SULFATE (2.5 MG/3ML) 0.083% IN NEBU
2.5000 mg | INHALATION_SOLUTION | Freq: Four times a day (QID) | RESPIRATORY_TRACT | Status: DC | PRN
  Administered 2024-01-05 – 2024-01-08 (×6): 2.5 mg via RESPIRATORY_TRACT
  Filled 2024-01-05 (×6): qty 3

## 2024-01-05 MED ORDER — PROPOFOL 10 MG/ML IV BOLUS
INTRAVENOUS | Status: DC | PRN
  Administered 2024-01-05: 50 mg via INTRAVENOUS
  Administered 2024-01-05: 100 mg via INTRAVENOUS

## 2024-01-05 MED ORDER — SODIUM BICARBONATE 8.4 % IV SOLN
50.0000 meq | Freq: Once | INTRAVENOUS | Status: AC
  Administered 2024-01-05: 50 meq via INTRAVENOUS
  Filled 2024-01-05: qty 50

## 2024-01-05 MED ORDER — ACETAMINOPHEN 10 MG/ML IV SOLN
INTRAVENOUS | Status: DC | PRN
  Administered 2024-01-05: 1000 mg via INTRAVENOUS

## 2024-01-05 MED ORDER — FENTANYL CITRATE (PF) 100 MCG/2ML IJ SOLN
INTRAMUSCULAR | Status: AC
  Filled 2024-01-05: qty 2

## 2024-01-05 MED ORDER — ACETAMINOPHEN 10 MG/ML IV SOLN
INTRAVENOUS | Status: AC
  Filled 2024-01-05: qty 100

## 2024-01-05 MED ORDER — PHENYLEPHRINE HCL (PRESSORS) 10 MG/ML IV SOLN
INTRAVENOUS | Status: DC | PRN
  Administered 2024-01-05 (×3): 160 ug via INTRAVENOUS
  Administered 2024-01-05: 240 ug via INTRAVENOUS

## 2024-01-05 MED ORDER — CALCIUM GLUCONATE 10 % IV SOLN
1.0000 g | Freq: Once | INTRAVENOUS | Status: AC
  Administered 2024-01-05: 1 g via INTRAVENOUS
  Filled 2024-01-05: qty 10

## 2024-01-05 MED ORDER — LACTATED RINGERS IV BOLUS
1000.0000 mL | Freq: Once | INTRAVENOUS | Status: AC
  Administered 2024-01-05: 1000 mL via INTRAVENOUS

## 2024-01-05 MED ORDER — ONDANSETRON HCL 4 MG/2ML IJ SOLN
INTRAMUSCULAR | Status: AC
  Filled 2024-01-05: qty 2

## 2024-01-05 MED ORDER — MECLIZINE HCL 25 MG PO TABS
25.0000 mg | ORAL_TABLET | Freq: Every day | ORAL | Status: DC
  Administered 2024-01-06 – 2024-01-08 (×3): 25 mg via ORAL
  Filled 2024-01-05 (×3): qty 1

## 2024-01-05 MED ORDER — SODIUM CHLORIDE 0.9 % IR SOLN
Status: DC | PRN
  Administered 2024-01-05: 3000 mL via INTRAVESICAL

## 2024-01-05 MED ORDER — LIDOCAINE HCL (CARDIAC) PF 100 MG/5ML IV SOSY
PREFILLED_SYRINGE | INTRAVENOUS | Status: DC | PRN
  Administered 2024-01-05: 80 mg via INTRATRACHEAL

## 2024-01-05 MED ORDER — CALCIUM CHLORIDE 10 % IV SOLN
INTRAVENOUS | Status: AC
  Filled 2024-01-05: qty 10

## 2024-01-05 MED ORDER — ACETAMINOPHEN 650 MG RE SUPP: 650.0000 mg | Freq: Four times a day (QID) | RECTAL | Status: DC | PRN

## 2024-01-05 MED ORDER — DEXTROSE 10 % IV SOLN: Freq: Once | INTRAVENOUS | Status: AC

## 2024-01-05 MED ORDER — DEXTROSE 50 % IV SOLN
1.0000 | Freq: Once | INTRAVENOUS | Status: AC
  Administered 2024-01-05: 50 mL via INTRAVENOUS
  Filled 2024-01-05: qty 50

## 2024-01-05 MED ORDER — INSULIN ASPART 100 UNIT/ML IV SOLN
5.0000 [IU] | Freq: Once | INTRAVENOUS | Status: AC
  Administered 2024-01-05: 5 [IU] via INTRAVENOUS
  Filled 2024-01-05: qty 0.05

## 2024-01-05 MED ORDER — PHENYLEPHRINE 80 MCG/ML (10ML) SYRINGE FOR IV PUSH (FOR BLOOD PRESSURE SUPPORT)
PREFILLED_SYRINGE | INTRAVENOUS | Status: AC
  Filled 2024-01-05: qty 10

## 2024-01-05 MED ORDER — SENNOSIDES-DOCUSATE SODIUM 8.6-50 MG PO TABS
1.0000 | ORAL_TABLET | Freq: Every day | ORAL | Status: DC
  Administered 2024-01-06 – 2024-01-09 (×4): 1 via ORAL
  Filled 2024-01-05 (×4): qty 1

## 2024-01-05 MED ORDER — LACTATED RINGERS IV BOLUS
750.0000 mL | Freq: Once | INTRAVENOUS | Status: AC
  Administered 2024-01-05: 750 mL via INTRAVENOUS

## 2024-01-05 MED ORDER — CEFAZOLIN SODIUM-DEXTROSE 2-4 GM/100ML-% IV SOLN
2.0000 g | Freq: Once | INTRAVENOUS | Status: AC
  Administered 2024-01-05: 2 g via INTRAVENOUS
  Filled 2024-01-05: qty 100

## 2024-01-05 MED ORDER — ONDANSETRON HCL 4 MG/2ML IJ SOLN: 4.0000 mg | Freq: Four times a day (QID) | INTRAMUSCULAR | Status: DC | PRN

## 2024-01-05 MED ORDER — LACTATED RINGERS IV SOLN: INTRAVENOUS | Status: AC

## 2024-01-05 MED ORDER — INSULIN ASPART 100 UNIT/ML IV SOLN
5.0000 [IU] | Freq: Once | INTRAVENOUS | Status: AC
  Administered 2024-01-06: 5 [IU] via INTRAVENOUS

## 2024-01-05 MED ORDER — ROCURONIUM BROMIDE 10 MG/ML (PF) SYRINGE
PREFILLED_SYRINGE | INTRAVENOUS | Status: DC | PRN
  Administered 2024-01-05: 50 mg via INTRAVENOUS

## 2024-01-05 MED ORDER — SIMVASTATIN 10 MG PO TABS
10.0000 mg | ORAL_TABLET | Freq: Every day | ORAL | Status: DC
  Administered 2024-01-06 – 2024-01-09 (×4): 10 mg via ORAL
  Filled 2024-01-05 (×4): qty 1

## 2024-01-05 MED ORDER — ONDANSETRON HCL 4 MG/2ML IJ SOLN
INTRAMUSCULAR | Status: DC | PRN
  Administered 2024-01-05: 4 mg via INTRAVENOUS

## 2024-01-05 MED ORDER — ALBUTEROL SULFATE (2.5 MG/3ML) 0.083% IN NEBU
10.0000 mg | INHALATION_SOLUTION | Freq: Once | RESPIRATORY_TRACT | Status: DC
  Filled 2024-01-05 (×2): qty 12

## 2024-01-05 MED ORDER — DOCUSATE SODIUM 100 MG PO CAPS
100.0000 mg | ORAL_CAPSULE | Freq: Every day | ORAL | Status: DC
  Administered 2024-01-06 – 2024-01-09 (×4): 100 mg via ORAL
  Filled 2024-01-05 (×4): qty 1

## 2024-01-05 MED ORDER — GABAPENTIN 300 MG PO CAPS
300.0000 mg | ORAL_CAPSULE | Freq: Every day | ORAL | Status: DC
  Administered 2024-01-05 – 2024-01-08 (×4): 300 mg via ORAL
  Filled 2024-01-05 (×4): qty 1

## 2024-01-05 MED ORDER — ACETAMINOPHEN 325 MG PO TABS: 650.0000 mg | ORAL_TABLET | Freq: Four times a day (QID) | ORAL | Status: DC | PRN

## 2024-01-05 MED ORDER — DEXAMETHASONE SODIUM PHOSPHATE 10 MG/ML IJ SOLN
INTRAMUSCULAR | Status: DC | PRN
  Administered 2024-01-05: 5 mg via INTRAVENOUS

## 2024-01-05 MED ORDER — HALOPERIDOL LACTATE 5 MG/ML IJ SOLN
1.0000 mg | Freq: Four times a day (QID) | INTRAMUSCULAR | Status: AC | PRN
  Administered 2024-01-06: 1 mg via INTRAVENOUS
  Filled 2024-01-05: qty 1

## 2024-01-05 MED ORDER — SODIUM ZIRCONIUM CYCLOSILICATE 10 G PO PACK
10.0000 g | PACK | Freq: Two times a day (BID) | ORAL | Status: AC
  Administered 2024-01-05 – 2024-01-06 (×3): 10 g via ORAL
  Filled 2024-01-05 (×3): qty 1

## 2024-01-05 MED ORDER — SODIUM CHLORIDE 0.9 % IV SOLN
2.0000 g | Freq: Once | INTRAVENOUS | Status: AC
  Administered 2024-01-05: 2 g via INTRAVENOUS
  Filled 2024-01-05: qty 20

## 2024-01-05 MED ORDER — PROPYLENE GLYCOL 0.6 % OP SOLN
2.0000 [drp] | Freq: Every day | OPHTHALMIC | Status: DC
Start: 2024-01-05 — End: 2024-01-05

## 2024-01-05 MED ORDER — POLYVINYL ALCOHOL 1.4 % OP SOLN
1.0000 [drp] | Freq: Every day | OPHTHALMIC | Status: DC
  Administered 2024-01-05 – 2024-01-09 (×5): 1 [drp] via OPHTHALMIC
  Filled 2024-01-05: qty 15

## 2024-01-05 MED ORDER — LATANOPROST 0.005 % OP SOLN
1.0000 [drp] | Freq: Every day | OPHTHALMIC | Status: DC
  Administered 2024-01-05 – 2024-01-08 (×4): 1 [drp] via OPHTHALMIC
  Filled 2024-01-05: qty 2.5

## 2024-01-05 MED ORDER — IOHEXOL 300 MG/ML  SOLN
INTRAMUSCULAR | Status: DC | PRN
  Administered 2024-01-05: 8 mL

## 2024-01-05 MED ORDER — MELATONIN 5 MG PO TABS
5.0000 mg | ORAL_TABLET | Freq: Once | ORAL | Status: AC
  Administered 2024-01-05: 5 mg via ORAL
  Filled 2024-01-05: qty 1

## 2024-01-05 MED ORDER — DEXTROSE 50 % IV SOLN
25.0000 mL | Freq: Once | INTRAVENOUS | Status: AC
  Administered 2024-01-06: 25 mL via INTRAVENOUS
  Filled 2024-01-05: qty 50

## 2024-01-05 MED ORDER — SODIUM CHLORIDE 0.9 % IV SOLN
2.0000 g | INTRAVENOUS | Status: DC
  Administered 2024-01-06 – 2024-01-09 (×4): 2 g via INTRAVENOUS
  Filled 2024-01-05 (×4): qty 20

## 2024-01-05 SURGICAL SUPPLY — 10 items
BAG URO CATCHER STRL LF (MISCELLANEOUS) ×1 IMPLANT
CATH URETL OPEN END 6FR 70 (CATHETERS) ×1 IMPLANT
CLOTH BEACON ORANGE TIMEOUT ST (SAFETY) ×1 IMPLANT
GLOVE SURG LX STRL 8.0 MICRO (GLOVE) ×1 IMPLANT
GOWN STRL REUS W/ TWL XL LVL3 (GOWN DISPOSABLE) ×2 IMPLANT
GUIDEWIRE STR DUAL SENSOR (WIRE) ×1 IMPLANT
MANIFOLD NEPTUNE II (INSTRUMENTS) ×1 IMPLANT
PACK CYSTO (CUSTOM PROCEDURE TRAY) ×1 IMPLANT
STENT URET 6FRX26 CONTOUR (STENTS) IMPLANT
TUBING CONNECTING 10 (TUBING) ×1 IMPLANT

## 2024-01-05 NOTE — Progress Notes (Signed)
[facility] following for sepsis protocol.

## 2024-01-05 NOTE — Anesthesia Procedure Notes (Addendum)
Procedure Name: Intubation Date/Time: 01/05/2024 1:52 PM  Performed by: Oletha Cruel, CRNAPre-anesthesia Checklist: Patient identified, Emergency Drugs available, Suction available and Patient being monitored Patient Re-evaluated:Patient Re-evaluated prior to induction Oxygen Delivery Method: Circle system utilized Preoxygenation: Pre-oxygenation with 100% oxygen Induction Type: IV induction Ventilation: Mask ventilation without difficulty Laryngoscope Size: Mac and 4 Grade View: Grade II Tube type: Oral Tube size: 7.5 mm Number of attempts: 1 Airway Equipment and Method: Stylet Placement Confirmation: ETT inserted through vocal cords under direct vision, positive ETCO2, CO2 detector and breath sounds checked- equal and bilateral Secured at: 25 (OETT secured 25cm upper lip.) cm Tube secured with: Tape Dental Injury: Teeth and Oropharynx as per pre-operative assessment  Comments: Unable to "seat" LMA size 5. Atraumatic intubation. Teeth and lips remain in preoperative condition.

## 2024-01-05 NOTE — Progress Notes (Signed)
RT went to give 10 mg Albuterol in ER 17, pt had already left the unit to go into surgery, of which RT was unaware.

## 2024-01-05 NOTE — Transfer of Care (Signed)
Immediate Anesthesia Transfer of Care Note  Patient: Charles Richard  Procedure(s) Performed: CYSTOSCOPY WITH RETROGRADE PYELOGRAM/URETERAL STENT PLACEMENT (Left: Ureter)  Patient Location: PACU  Anesthesia Type:General  Level of Consciousness: drowsy and patient cooperative  Airway & Oxygen Therapy: Patient Spontanous Breathing and Patient connected to face mask oxygen  Post-op Assessment: Report given to RN and Post -op Vital signs reviewed and stable  Post vital signs: Reviewed and stable  Last Vitals:  Vitals Value Taken Time  BP 73/52 01/05/24 1430  Temp 36.2 C 01/05/24 1428  Pulse 94 01/05/24 1429  Resp 21 01/05/24 1431  SpO2 100 % 01/05/24 1429  Vitals shown include unfiled device data.  Last Pain:  Vitals:   01/05/24 1428  TempSrc:   PainSc: Asleep         Complications: No notable events documented.

## 2024-01-05 NOTE — ED Notes (Signed)
Urology on call paged.

## 2024-01-05 NOTE — Consult Note (Addendum)
Renal Service Consult Note [facility]  Charles Richard 01/05/2024 Maree Krabbe, MD Requesting Physician: Dr. Robb Matar  Reason for Consult: Renal failure HPI: The patient is a 89 y.o. year-old w/ PMH w/ hx of atrial fib, DM2, HTN, HOH, hx CVA, HL, hx L hydronephrosis d/t stones rx'd w/ L ureteral stent, h/o MI who presented to ED this am c/o lethary and L flank pain. Hx of kidney stones. Also feels warm, +pain w/ urination, also cough. Hx afib and chronic foot ulcers. No n/v/d. HOH. In ED BP 95/60, SpO2 wnl on RA, dry cough, temp 100. Sepsis w/u started. wBC 13, LA 0.9. CMP showed K+ 6.9 and creat 6.6 (b/l creat 1.5- 2.0). CXR neg. CT abdomen showed obstruction kidney stone on the L w/ L-sided hydronephrosis. Urology consulted and IV abx started and pt will be admitted. We are asked to see for renal failure.   EKG w/o changes related to [place]. Pt rec'd IV ins/dextrose/ Ca=+ and repeat K+ was down to to 5.9. Pt also rec'd 2 L LR bolus, IV rocephin.   Pt had normal renal function in 2010 but in 2018 started to show CKD w/ creat mid 1's, and in 2021-2022 creat is 1.5- 2.0 range. No labs between aug 2022 and now, nothing in CE either.   Pt seen in ICU room post-procedure. Pt is alert and has no c/o's. Daughter and son are at bedside. Dtr donated one of her kidneys to her brother, the brother in the room, 4 yrs ago. He developd esrd from polycystic kidney disease (it is on their mother's side).   ROS - denies CP, no joint pain, no HA, no blurry vision, no rash, no diarrhea, no nausea/ vomiting, no dysuria, no difficulty voiding   Past Medical History  Past Medical History:  Diagnosis Date   AAA (abdominal aortic aneurysm) without rupture (HCC) 06/03/2017   Arthritis    Atrial fibrillation with rapid ventricular response (HCC) 04/2017   New diagnosis during admission for urosepsis secondary to left nephrolithiasis/ureteral stone with hydronephrosis   COVID-19    04/2020   Diabetes  mellitus without complication (HCC)    Dysrhythmia    a fib   Essential hypertension    Hard of hearing    History of CVA (cerebrovascular accident) without residual deficits 05/2009   Admitted with expressive aphasia & confusion following a neurocardiogenic syncope event (related to dehydration, AKD & BB related hypotension/bradycardia)   History of kidney stones    Hyperlipidemia    Left nephrolithiasis 04/2017   s/p L Ureteral stent for L hydronephrosis & UTI/Urosepsis.     Myocardial infarction (HCC)    Sepsis (HCC)    Skin ulcers of foot, bilateral (HCC)    Stroke (HCC)    mild stroke no residuals   Past Surgical History  Past Surgical History:  Procedure Laterality Date   CHOLECYSTECTOMY     CYSTOSCOPY W/ URETERAL STENT PLACEMENT Left 05/09/2017   Procedure: CYSTOSCOPY WITH RETROGRADE PYELOGRAM/URETERAL STENT PLACEMENT;  Surgeon: Jerilee Field, MD;  Location: [facility];  Service: Urology;  Laterality: Left;   CYSTOSCOPY WITH RETROGRADE PYELOGRAM, URETEROSCOPY AND STENT PLACEMENT Left 06/08/2017   Procedure: CYSTOSCOPY WITH LEFT RETROGRADE PYELOGRAM, URETEROSCOPY HOLMIUM LASER LITHOTRIPSY BASKET EXTRACTION  AND STENT EXCHANGE;  Surgeon: Jerilee Field, MD;  Location: [facility];  Service: Urology;  Laterality: Left;   EYE SURGERY     right   HOLMIUM LASER APPLICATION Left 06/08/2017   Procedure: HOLMIUM LASER APPLICATION;  Surgeon: Mena Goes,  Molli Hazard, MD;  Location: [facility];  Service: Urology;  Laterality: Left;   LITHOTRIPSY     REVERSE SHOULDER ARTHROPLASTY Right 07/20/2017   Procedure: REVERSE RIGHT SHOULDER ARTHROPLASTY;  Surgeon: Yolonda Kida, MD;  Location: [facility] OR;  Service: Orthopedics;  Laterality: Right;  150 mins   Family History  Family History  Problem Relation Age of Onset   Lung cancer Father        black lung   CAD Neg Hx    Kidney Stones Neg Hx    Aneurysm Neg Hx    Social History  reports that he has never smoked. He has never used smokeless tobacco.  He reports that he does not drink alcohol and does not use drugs. Allergies No Known Allergies Home medications Prior to Admission medications   Medication Sig Start Date End Date Taking? Authorizing Provider  apixaban (ELIQUIS) 5 MG TABS tablet Take 2.5 mg by mouth 2 (two) times daily.   Yes [provider]  carvedilol (COREG) 6.25 MG tablet Take 1 tablet (6.25 mg total) by mouth 2 (two) times daily with a meal. Patient taking differently: Take 6.25 mg by mouth daily. 08/02/21  Yes Rodolph Bong, MD  cephALEXin (KEFLEX) 500 MG capsule Take 1 capsule (500 mg total) by mouth 4 (four) times daily. Patient taking differently: Take 500 mg by mouth 2 (two) times daily. 11/13/21  Yes [place], Rolly Salter E, FNP  Cholecalciferol (VITAMIN D) 50 MCG (2000 UT) tablet Take 2,000 Units by mouth daily.   Yes [provider]  docusate sodium (COLACE) 100 MG capsule Take 100 mg by mouth daily.   Yes [provider]  fluticasone (FLONASE) 50 MCG/ACT nasal spray Place 1 spray into both nostrils daily as needed for allergies.   Yes [provider]  gabapentin (NEURONTIN) 300 MG capsule Take 300 mg by mouth 2 (two) times daily.   Yes [provider]  loratadine (CLARITIN) 10 MG tablet Take 10 mg by mouth daily with breakfast.    Yes [provider]  nystatin-triamcinolone (MYCOLOG II) cream APPLY SMALL AMOUNT TO AFFECTED AREA DAILY 09/18/22  Yes [provider]  SITagliptin (ZITUVIO) 25 MG TABS Take 25 mg by mouth daily.   Yes [provider]  Glucerna (GLUCERNA) LIQD Take 237 mLs by mouth 2 (two) times daily between meals.    [provider]  latanoprost (XALATAN) 0.005 % ophthalmic solution Place 1 drop into both eyes at bedtime.    [provider]  lisinopril (ZESTRIL) 20 MG tablet Take 20 mg by mouth daily.    [provider]  meclizine (ANTIVERT) 25 MG tablet Take 25 mg by mouth daily.    [provider]   metFORMIN (GLUCOPHAGE) 500 MG tablet Take 500 mg by mouth 2 (two) times daily with a meal.    [provider]  Multiple Vitamin (MULTIVITAMIN WITH MINERALS) TABS tablet Take 1 tablet by mouth daily.    [provider]  ondansetron (ZOFRAN) 8 MG tablet Take 4 mg by mouth every 12 (twelve) hours as needed for nausea or vomiting.    [provider]  Propylene Glycol 0.6 % SOLN Place 1-2 drops into both eyes daily as needed (for dry eyes).    [provider]  senna-docusate (SENOKOT-S) 8.6-50 MG tablet Take 1 tablet by mouth 2 (two) times daily. 08/02/21   Rodolph Bong, MD  simvastatin (ZOCOR) 20 MG tablet Take 10 mg by mouth daily.    [provider]  tamsulosin (FLOMAX) 0.4 MG CAPS capsule Take 0.4 mg by mouth daily.    [provider]  traMADol (ULTRAM) 50 MG tablet Take 50-100 mg by mouth at bedtime as needed for moderate pain.    [provider]  vitamin B-12 (CYANOCOBALAMIN) 1000 MCG tablet Take 1 tablet (1,000 mcg total) by mouth daily. 08/02/21   Rodolph Bong, MD  zolpidem (AMBIEN) 10 MG tablet Take 10 mg by mouth at bedtime.    [provider]     Vitals:   01/05/24 1145 01/05/24 1200 01/05/24 1215 01/05/24 1222  BP: 103/65 94/65 (!) 88/60   Pulse:      Resp:      Temp:    98 F (36.7 C)  TempSrc:    Oral  SpO2:   98%   Weight:      Height:       Exam Gen alert, no distress, elderly male  No rash, cyanosis or gangrene Sclera anicteric, throat clear  No jvd or bruits Chest clear bilat to bases, no rales/ wheezing RRR no MRG Abd soft ntnd no mass or ascites +bs GU nl male MS no joint effusions or deformity Ext no LE or UE edema, no other edema Neuro is alert, Ox 3 , nf, no asterixis      Renal-related home meds: - coreg 6.25 bid - lisinopril 20 every day - metformin 500 bid   Date   Creat  eGFR (ml/min) 2010   1.00 2018   1.0- 1.5 2021-2022  1.5- 2.0  31- 44 ml/min, CKD  3b 01/05/24  6.63  7 ml/min      UA prot neg, 0-5 rbc/ wbc/ epi    CT renal stone - moderate L hydroureteronephrosis w/ 6 mm stone in mid left ureter. Both kidneys are on the small side and atrophic.   Assessment/ Plan: AKI on CKD 3b - b/l creat from 2022 was 1.5- 2.0, eGFR 31- 44 ml/min. Creat here was 6.6 this am. Pt has atrophic kidneys in general on both sides, also on the L their is hydronephrosis. UA negative. On exam euvolemic to a bit dry. Urology consulted and placed ureteral stent this afternoon. Dtr says that his recent labs (seen by [place]) have been creat in the mid 2's. Will start IVF"s and follow closely. No strong indication for RRT, hopefully will improve after the procedure.  Hyperkalemia - K+ 6.9 rx'd w/ some temporizing measure and down to 5.9 now. Will add albuterol and IV NaHCO3. Cont to follow and use lokelma when he can eat, and renal diet when he can eat.  Volume - looks euvolemic on exam. CXR clear.       Vinson Moselle  MD [facility] 01/05/2024, 12:24 PM  Recent Labs  Lab 01/05/24 0817 01/05/24 0834 01/05/24 1108 01/05/24 1118  HGB 3.7* 10.4*  --  9.5*  ALBUMIN 2.4*  --   --   --   CALCIUM 7.7*  --  8.0*  --   CREATININE 6.63*  --  6.68* 7.30*  K 6.9*  --  5.9* 5.9*   Inpatient medications:

## 2024-01-05 NOTE — Anesthesia Preprocedure Evaluation (Addendum)
Anesthesia Evaluation  Patient identified by MRN, date of birth, ID band Patient awake    Reviewed: Allergy & Precautions, H&P , NPO status , Patient's Chart, lab work & pertinent test results, reviewed documented beta blocker date and time   Airway Mallampati: III  TM Distance: >3 FB Neck ROM: Full    Dental  (+) Edentulous Upper, Poor Dentition, Loose, Dental Advisory Given Bottom teeth extremely loose and diseased, upper edentulous except one tooth:   Pulmonary neg pulmonary ROS   breath sounds clear to auscultation       Cardiovascular hypertension (90/62 preop), Pt. on medications and Pt. on home beta blockers + Past MI  + dysrhythmias (eliquis) Atrial Fibrillation  Rhythm:Regular Rate:Normal  Echo 2022  1. Left ventricular ejection fraction, by estimation, is 55 to 60%. The  left ventricle has normal function. The left ventricle has no regional  wall motion abnormalities. There is moderate left ventricular hypertrophy.  Left ventricular diastolic parameters are indeterminate.   2. Right ventricular systolic function is normal. The right ventricular  size is normal. There is normal pulmonary artery systolic pressure. The  estimated right ventricular systolic pressure is 19.6 mmHg.   3. Left atrial size was mildly dilated.   4. The mitral valve is grossly normal. Trivial mitral valve  regurgitation.   5. The aortic valve is tricuspid. Aortic valve regurgitation is not  visualized. Mild aortic valve sclerosis is present, with no evidence of  aortic valve stenosis.   6. The inferior vena cava is normal in size with greater than 50%  respiratory variability, suggesting right atrial pressure of 3 mmHg.     Neuro/Psych CVA (2010), No Residual Symptoms  negative psych ROS   GI/Hepatic negative GI ROS, Neg liver ROS,,,  Endo/Other  diabetes, Well Controlled, Type 2, Oral Hypoglycemic Agents    Renal/GU Renal disease (L  ureteral stone)K 6 on admission Sepsis and ARF 2/2 stone, Cr >7 from baseline 1.5  negative genitourinary   Musculoskeletal  (+) Arthritis , Osteoarthritis,    Abdominal  (+) + obese  Peds negative pediatric ROS (+)  Hematology  (+) Blood dyscrasia, anemia   Anesthesia Other Findings   Reproductive/Obstetrics negative OB ROS                             Anesthesia Physical Anesthesia Plan  ASA: 4  Anesthesia Plan: General   Post-op Pain Management: Ofirmev IV (intra-op)*   Induction: Intravenous  PONV Risk Score and Plan: 3 and Ondansetron, Dexamethasone and Treatment may vary due to age or medical condition  Airway Management Planned: Oral ETT  Additional Equipment: None  Intra-op Plan:   Post-operative Plan: Extubation in OR and Possible Post-op intubation/ventilation  Informed Consent: I have reviewed the patients History and Physical, chart, labs and discussed the procedure including the risks, benefits and alternatives for the proposed anesthesia with the patient or authorized representative who has indicated his/her understanding and acceptance.     Dental advisory given  Plan Discussed with: CRNA  Anesthesia Plan Comments: (K 6 on admission- s/p insulin/ca/dextrose in ED, repeat K 5.2 on istat. D/w son at bedside possibility of worsening sepsis, postoperative intubation, pressors and blood products)        Anesthesia Quick Evaluation

## 2024-01-05 NOTE — ED Provider Notes (Addendum)
[place] EMERGENCY DEPARTMENT AT [facility] Provider Note   CSN: [id number] Arrival date & time: 01/05/24  0801     History  Chief Complaint  Patient presents with   Altered Mental Status   Cough   Flank Pain    Charles Richard is a 89 y.o. male.  Patient here with family.  Been lethargic with some left flank pain history of kidney stones.  Felt like he was warm.  Maybe some pain with urination.  But he is also had a cough.  History of A-fib diabetes chronic ulcers to his feet.  He is been on Keflex for the period he is on Eliquis.  Denies any nausea vomiting diarrhea.  Patient is hard of hearing.  But main concern today is left flank pain.  Family concern for kidney stone.  The history is provided by the patient and a caregiver.       Home Medications Prior to Admission medications   Medication Sig Start Date End Date Taking? Authorizing Provider  apixaban (ELIQUIS) 5 MG TABS tablet Take 2.5 mg by mouth 2 (two) times daily.   Yes [provider]  carvedilol (COREG) 6.25 MG tablet Take 1 tablet (6.25 mg total) by mouth 2 (two) times daily with a meal. Patient taking differently: Take 6.25 mg by mouth daily. 08/02/21  Yes Rodolph Bong, MD  cephALEXin (KEFLEX) 500 MG capsule Take 1 capsule (500 mg total) by mouth 4 (four) times daily. Patient taking differently: Take 500 mg by mouth 2 (two) times daily. 11/13/21  Yes [place], Rolly Salter E, FNP  Cholecalciferol (VITAMIN D) 50 MCG (2000 UT) tablet Take 2,000 Units by mouth daily.   Yes [provider]  docusate sodium (COLACE) 100 MG capsule Take 100 mg by mouth daily.   Yes [provider]  fluticasone (FLONASE) 50 MCG/ACT nasal spray Place 1 spray into both nostrils daily as needed for allergies.   Yes [provider]  gabapentin (NEURONTIN) 300 MG capsule Take 300 mg by mouth 2 (two) times daily.   Yes [provider]  loratadine (CLARITIN) 10 MG tablet Take 10 mg by mouth  daily with breakfast.    Yes [provider]  nystatin-triamcinolone (MYCOLOG II) cream APPLY SMALL AMOUNT TO AFFECTED AREA DAILY 09/18/22  Yes [provider]  SITagliptin (ZITUVIO) 25 MG TABS Take 25 mg by mouth daily.   Yes [provider]  Glucerna (GLUCERNA) LIQD Take 237 mLs by mouth 2 (two) times daily between meals.    [provider]  latanoprost (XALATAN) 0.005 % ophthalmic solution Place 1 drop into both eyes at bedtime.    [provider]  lisinopril (ZESTRIL) 20 MG tablet Take 20 mg by mouth daily.    [provider]  meclizine (ANTIVERT) 25 MG tablet Take 25 mg by mouth daily.    [provider]  metFORMIN (GLUCOPHAGE) 500 MG tablet Take 500 mg by mouth 2 (two) times daily with a meal.    [provider]  Multiple Vitamin (MULTIVITAMIN WITH MINERALS) TABS tablet Take 1 tablet by mouth daily.    [provider]  ondansetron (ZOFRAN) 8 MG tablet Take 4 mg by mouth every 12 (twelve) hours as needed for nausea or vomiting.    [provider]  Propylene Glycol 0.6 % SOLN Place 1-2 drops into both eyes daily as needed (for dry eyes).    [provider]  senna-docusate (SENOKOT-S) 8.6-50 MG tablet Take 1 tablet by mouth  2 (two) times daily. 08/02/21   Rodolph Bong, MD  simvastatin (ZOCOR) 20 MG tablet Take 10 mg by mouth daily.    [provider]  tamsulosin (FLOMAX) 0.4 MG CAPS capsule Take 0.4 mg by mouth daily.    [provider]  traMADol (ULTRAM) 50 MG tablet Take 50-100 mg by mouth at bedtime as needed for moderate pain.    [provider]  vitamin B-12 (CYANOCOBALAMIN) 1000 MCG tablet Take 1 tablet (1,000 mcg total) by mouth daily. 08/02/21   Rodolph Bong, MD  zolpidem (AMBIEN) 10 MG tablet Take 10 mg by mouth at bedtime.    [provider]      Allergies    Patient has no known allergies.    Review of Systems   Review of  Systems  Physical Exam Updated Vital Signs  ED Triage Vitals  Encounter Vitals Group     BP 01/05/24 0830 93/62     Systolic BP Percentile --      Diastolic BP Percentile --      Pulse Rate 01/05/24 0830 97     Resp 01/05/24 0834 (!) 24     Temp 01/05/24 0833 100 F (37.8 C)     Temp Source 01/05/24 0833 Rectal     SpO2 01/05/24 0830 98 %     Weight 01/05/24 0812 229 lb 0.9 oz (103.9 kg)     Height 01/05/24 0812 6' (1.829 m)     Head Circumference --      Peak Flow --      Pain Score 01/05/24 0812 0     Pain Loc --      Pain Education --      Exclude from Growth Chart --     Physical Exam Vitals and nursing note reviewed.  Constitutional:      General: He is not in acute distress.    Appearance: He is well-developed. He is not ill-appearing.  HENT:     Head: Normocephalic and atraumatic.     Nose: Nose normal.     Mouth/Throat:     Mouth: Mucous membranes are moist.  Eyes:     Extraocular Movements: Extraocular movements intact.     Conjunctiva/sclera: Conjunctivae normal.     Pupils: Pupils are equal, round, and reactive to light.  Cardiovascular:     Rate and Rhythm: Normal rate and regular rhythm.     Pulses: Normal pulses.     Heart sounds: No murmur heard. Pulmonary:     Effort: Pulmonary effort is normal. No respiratory distress.     Breath sounds: Normal breath sounds.  Abdominal:     General: Abdomen is flat.     Palpations: Abdomen is soft.     Tenderness: There is abdominal tenderness. There is left CVA tenderness.  Genitourinary:    Rectum: Guaiac result negative.  Musculoskeletal:        General: No swelling.     Cervical back: Normal range of motion and neck supple.  Skin:    General: Skin is warm and dry.     Capillary Refill: Capillary refill takes less than 2 seconds.  Neurological:     General: No focal deficit present.     Mental Status: He is alert.  Psychiatric:        Mood and Affect: Mood normal.     ED Results / Procedures /  Treatments   Labs (all labs ordered are listed, but only abnormal results are displayed) Labs  Reviewed  CBC WITH DIFFERENTIAL/PLATELET - Abnormal; Notable for the following components:      Result Value   RBC 1.17 (*)    Hemoglobin 3.7 (*)    HCT 13.1 (*)    MCV 112.0 (*)    MCHC 28.2 (*)    Platelets 84 (*)    Lymphs Abs 0.6 (*)    All other components within normal limits  COMPREHENSIVE METABOLIC PANEL - Abnormal; Notable for the following components:   Sodium 130 (*)    Potassium 6.9 (*)    CO2 11 (*)    BUN 76 (*)    Creatinine, Ser 6.63 (*)    Calcium 7.7 (*)    Total Protein 5.8 (*)    Albumin 2.4 (*)    Total Bilirubin 1.3 (*)    GFR, Estimated 7 (*)    All other components within normal limits  URINALYSIS, ROUTINE W REFLEX MICROSCOPIC - Abnormal; Notable for the following components:   Color, Urine STRAW (*)    Glucose, UA 50 (*)    Hgb urine dipstick MODERATE (*)    Bacteria, UA RARE (*)    All other components within normal limits  PROTIME-INR - Abnormal; Notable for the following components:   Prothrombin Time 20.3 (*)    INR 1.7 (*)    All other components within normal limits  CBC - Abnormal; Notable for the following components:   WBC 13.6 (*)    RBC 3.24 (*)    Hemoglobin 10.4 (*)    HCT 32.8 (*)    MCV 101.2 (*)    All other components within normal limits  BASIC METABOLIC PANEL - Abnormal; Notable for the following components:   Sodium 133 (*)    Potassium 5.9 (*)    CO2 16 (*)    Glucose, Bld 111 (*)    BUN 78 (*)    Creatinine, Ser 6.68 (*)    Calcium 8.0 (*)    GFR, Estimated 7 (*)    All other components within normal limits  I-STAT CHEM 8, ED - Abnormal; Notable for the following components:   Sodium 132 (*)    Potassium 5.9 (*)    BUN 83 (*)    Creatinine, Ser 7.30 (*)    Glucose, Bld 105 (*)    Calcium, Ion 1.13 (*)    TCO2 17 (*)    Hemoglobin 9.5 (*)    HCT 28.0 (*)    All other components within normal limits  RESP PANEL BY  RT-PCR (RSV, FLU A&B, COVID)  RVPGX2  CULTURE, BLOOD (ROUTINE X 2)  CULTURE, BLOOD (ROUTINE X 2)  APTT  LIPASE, BLOOD  GLUCOSE, RANDOM  I-STAT CG4 LACTIC ACID, ED  POC OCCULT BLOOD, ED  I-STAT CG4 LACTIC ACID, ED  CBG MONITORING, ED  TYPE AND SCREEN    EKG None  Radiology CT Renal Stone Study Result Date: 01/05/2024 CLINICAL DATA:  Abdominal and flank pain.  Nephrolithiasis. EXAM: CT ABDOMEN AND PELVIS WITHOUT CONTRAST TECHNIQUE: Multidetector CT imaging of the abdomen and pelvis was performed following the standard protocol without IV contrast. RADIATION DOSE REDUCTION: This exam was performed according to the departmental dose-optimization program which includes automated exposure control, adjustment of the mA and/or kV according to patient size and/or use of iterative reconstruction technique. COMPARISON:  07/30/2021 FINDINGS: Lower chest: No acute findings. Hepatobiliary:  No mass visualized on this unenhanced exam. Pancreas: No mass or inflammatory process visualized on this unenhanced exam. Spleen:  Within normal limits  in size. Adrenals/Urinary tract: Stable right renal benign Bosniak category 1 and 2 renal cysts again seen. Several renal calculi are seen bilaterally largest measuring 1 cm. Moderate left hydroureteronephrosis is seen due to a 6 mm calculus in the mid left ureter near the iliac vessels. A 2 mm calculus is also seen in the proximal left ureter and 2 small calculi are also seen in the urinary bladder. Stomach/Bowel: No evidence of obstruction, inflammatory process, or abnormal fluid collections. Vascular/Lymphatic: No evidence of abdominal aortic aneurysm. 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 No acute vascular calcification or lymphadenopathy identified. Reproductive:  No mass or other significant abnormality. Other:   Small fat-containing midline  ventral hernia is stable. Musculoskeletal: No suspicious bone lesions identified. Mild T11 vertebral body fracture is seen which appears acute or subacute in age. IMPRESSION: Moderate left hydroureteronephrosis due to 6 mm calculus in mid left ureter. Additional 2 mm calculus in proximal left ureter, and 2 small calculi in urinary bladder. Bilateral nephrolithiasis. Acute versus subacute compression fracture of the T11 vertebral body. 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 Stable small fat-containing midline ventral hernia. Electronically Signed   By: Danae Orleans M.D.   On: 01/05/2024 10:55   DG Chest Portable 1 View Result Date: 01/05/2024 CLINICAL DATA:  Cough. EXAM: PORTABLE CHEST 1 VIEW COMPARISON:  None Available. FINDINGS: The heart size and mediastinal contours are within normal limits. Both lungs are clear. Right shoulder prosthesis noted. IMPRESSION: No active disease. Electronically Signed   By: Danae Orleans M.D.   On: 01/05/2024 09:16    Procedures .Critical Care  Performed by: Virgina Norfolk, DO Authorized by: Virgina Norfolk, DO   Critical care provider statement:    Critical care time (minutes):  45   Critical care was necessary to treat or prevent imminent or life-threatening deterioration of the following conditions:  Sepsis and renal failure   Critical care was time spent personally by me on the following activities:  Blood draw for specimens, development of treatment plan with patient or surrogate, discussions with primary provider, evaluation of patient's response to treatment, examination of patient, obtaining history from patient or surrogate, ordering and performing treatments and interventions, ordering and review of laboratory studies, ordering and review of radiographic studies, pulse oximetry and re-evaluation of patient's condition   Care discussed with: admitting  provider       Medications Ordered in ED Medications  lactated ringers bolus 1,000 mL (1,000 mLs Intravenous New Bag/Given 01/05/24 0850)  cefTRIAXone (ROCEPHIN) 2 g in sodium chloride 0.9 % 100 mL IVPB (2 g Intravenous New Bag/Given 01/05/24 0849)  lactated ringers bolus 1,000 mL (1,000 mLs Intravenous New Bag/Given 01/05/24 1117)  calcium gluconate inj 10% (1 g) URGENT USE ONLY! (1 g Intravenous Given 01/05/24 1204)  insulin aspart (novoLOG) injection 5 Units (5 Units Intravenous Given 01/05/24 1152)    And  dextrose 50 % solution 50 mL (50 mLs Intravenous Given 01/05/24 1150)  dextrose 10 % infusion ( Intravenous New Bag/Given 01/05/24 1203)    ED Course/ Medical Decision Making/ A&P                                 Medical Decision Making Amount and/or Complexity of Data Reviewed Labs: ordered. Radiology: ordered.  Risk OTC drugs. Prescription drug management. Decision regarding hospitalization.   Charles Richard is here with cough abdominal pain altered mental status.  Patient with low-grade fever of 100.  Blood pressure 98/60.  Normal room air oxygenation.  He has a dry cough on exam.  He is complaint of left flank pain.  History of diabetes kidney stones A-fib on Eliquis.  Overall sepsis workup to be initiated.  Blood cultures lactic acid CBC CMP lipase CT scan renal study urinalysis.  I have started Rocephin given flank pain and concern for UTI pyelonephritis kidney stone.  Seems less likely to be an intra-abdominal process like diverticulitis.  But will evaluate.  Give fluid bolus as well.  Overall hemoglobin came back at 3.4.  I think that this is likely a lab error.  Hemoccult was negative.  Will likely need to resend CBC and BMP.  Hemoccult was negative.  Lactic acid 0.9.  Repeat CBC does show now a hemoglobin of 10.4.  White count of 13.6.  Initial CMP shows a potassium of 6.9 and creatinine is 6.6.  However I am concerned that this could be a lab error as well given that the  CBC was wrong.  Will verify these numbers.  Will get an EKG.  He is got some mild CKD at baseline but his creatinine is usually around 2.  COVID flu RSV testing negative.  Chest x-ray negative for infection.  CT renal stone study in process.  EKG does not show any hyperkalemic changes however will give calcium insulin and dextrose given that his potassium is about 6 on i-STAT as well.  Creatinine does appear to be elevated much from his baseline of 7.3 as well.  Lactic acid on repeat is 0.6.  Does look like on CT scan he has an obstructive kidney stone on the left.  Overall he does appear to be septic with acute kidney injury from this kidney stone as well.  Will talk with Dr. Ronne Binning with urology for stent placement will have him admitted to the hospitalist for further care.  Additional fluid bolus given.  Will continue to trend creatinine see if there is improvement following treatment.  This chart was dictated using voice recognition software.  Despite best efforts to proofread,  errors can occur which can change the documentation meaning.         Final Clinical Impression(s) / ED Diagnoses Final diagnoses:  Kidney stone  Sepsis, due to unspecified organism, unspecified whether acute organ dysfunction present [facility])  Hyperkalemia  AKI (acute kidney injury) [facility])    Rx / DC Orders ED Discharge Orders     None         Virgina Norfolk, DO 01/05/24 1134    Virgina Norfolk, DO 01/05/24 1213

## 2024-01-05 NOTE — Consult Note (Signed)
Urology Consult  Referring physician: Dr. Lockie Mola Reason for referral: left ureteral calculus, sepsis  Chief Complaint: left flank pain  History of Present Illness: Mr Poche is a 89yo with a history of nephrolithiasis who presented to the ER with a 2 day history of worsening left flank pain and confusion. He has had multiple stone events in the past and he has required ureteroscopy. His left flank pain initially started a week ago but acutely worsened 2 days ago. He denies any worsening LUTS. Ct obtained in the ER shows a 6mm mid ureteral calculus and a 2mm left proximal ureteral calculus. WBC count 5.6. UA concerning for infection. Temp 100.  Past Medical History:  Diagnosis Date   AAA (abdominal aortic aneurysm) without rupture (HCC) 06/03/2017   Arthritis    Atrial fibrillation with rapid ventricular response (HCC) 04/2017   New diagnosis during admission for urosepsis secondary to left nephrolithiasis/ureteral stone with hydronephrosis   COVID-19    04/2020   Diabetes mellitus without complication (HCC)    Dysrhythmia    a fib   Essential hypertension    Hard of hearing    History of CVA (cerebrovascular accident) without residual deficits 05/2009   Admitted with expressive aphasia & confusion following a neurocardiogenic syncope event (related to dehydration, AKD & BB related hypotension/bradycardia)   History of kidney stones    Hyperlipidemia    Left nephrolithiasis 04/2017   s/p L Ureteral stent for L hydronephrosis & UTI/Urosepsis.     Myocardial infarction (HCC)    Sepsis (HCC)    Skin ulcers of foot, bilateral (HCC)    Stroke (HCC)    mild stroke no residuals   Past Surgical History:  Procedure Laterality Date   CHOLECYSTECTOMY     CYSTOSCOPY W/ URETERAL STENT PLACEMENT Left 05/09/2017   Procedure: CYSTOSCOPY WITH RETROGRADE PYELOGRAM/URETERAL STENT PLACEMENT;  Surgeon: Jerilee Field, MD;  Location: [facility];  Service: Urology;  Laterality: Left;   CYSTOSCOPY WITH  RETROGRADE PYELOGRAM, URETEROSCOPY AND STENT PLACEMENT Left 06/08/2017   Procedure: CYSTOSCOPY WITH LEFT RETROGRADE PYELOGRAM, URETEROSCOPY HOLMIUM LASER LITHOTRIPSY BASKET EXTRACTION  AND STENT EXCHANGE;  Surgeon: Jerilee Field, MD;  Location: [facility];  Service: Urology;  Laterality: Left;   EYE SURGERY     right   HOLMIUM LASER APPLICATION Left 06/08/2017   Procedure: HOLMIUM LASER APPLICATION;  Surgeon: Jerilee Field, MD;  Location: [facility];  Service: Urology;  Laterality: Left;   LITHOTRIPSY     REVERSE SHOULDER ARTHROPLASTY Right 07/20/2017   Procedure: REVERSE RIGHT SHOULDER ARTHROPLASTY;  Surgeon: Yolonda Kida, MD;  Location: [facility];  Service: Orthopedics;  Laterality: Right;  150 mins    Medications: I have reviewed the patient's current medications. Allergies: No Known Allergies  Family History  Problem Relation Age of Onset   Lung cancer Father        black lung   CAD Neg Hx    Kidney Stones Neg Hx    Aneurysm Neg Hx    Social History:  reports that he has never smoked. He has never used smokeless tobacco. He reports that he does not drink alcohol and does not use drugs.  Review of Systems  Constitutional:  Positive for chills.  Genitourinary:  Positive for dysuria and frequency.  Musculoskeletal:  Positive for back pain.  All other systems reviewed and are negative.   Physical Exam:  Vital signs in last 24 hours: Temp:  [100 F (37.8 C)] 100 F (37.8 C) (01/25 0833) Pulse Rate:  [79-97]  79 (01/25 0930) Resp:  [24] 24 (01/25 0834) BP: (90-98)/(62-69) 90/62 (01/25 0930) SpO2:  [90 %-100 %] 90 % (01/25 0930) Weight:  [103.9 kg] 103.9 kg (01/25 1610) Physical Exam Vitals reviewed.  Constitutional:      Appearance: Normal appearance.  HENT:     Head: Normocephalic and atraumatic.     Nose: Nose normal.     Mouth/Throat:     Mouth: Mucous membranes are dry.  Eyes:     Extraocular Movements: Extraocular movements intact.     Pupils: Pupils are equal,  round, and reactive to light.  Cardiovascular:     Rate and Rhythm: Normal rate and regular rhythm.  Pulmonary:     Effort: Pulmonary effort is normal. No respiratory distress.  Abdominal:     General: Abdomen is flat. There is no distension.  Musculoskeletal:        General: No swelling. Normal range of motion.     Cervical back: Normal range of motion and neck supple.  Skin:    General: Skin is warm and dry.  Neurological:     General: No focal deficit present.     Mental Status: He is alert and oriented to person, place, and time.  Psychiatric:        Mood and Affect: Mood normal.        Behavior: Behavior normal.        Thought Content: Thought content normal.        Judgment: Judgment normal.     Laboratory Data:  Results for orders placed or performed during the hospital encounter of 01/05/24 (from the past 72 hours)  CBC with Differential     Status: Abnormal   Collection Time: 01/05/24  8:17 AM  Result Value Ref Range   WBC 5.6 4.0 - 10.5 K/uL   RBC 1.17 (L) 4.22 - 5.81 MIL/uL   Hemoglobin 3.7 (LL) 13.0 - 17.0 g/dL    Comment: This critical result has verified and been called to [facility], T by LINDSAY,CELESTE on 01 25 2025 at 0832, and has been read back.    HCT 13.1 (L) 39.0 - 52.0 %   MCV 112.0 (H) 80.0 - 100.0 fL   MCH 31.6 26.0 - 34.0 pg   MCHC 28.2 (L) 30.0 - 36.0 g/dL   RDW 96.0 45.4 - 09.8 %   Platelets 84 (L) 150 - 400 K/uL    Comment: SPECIMEN CHECKED FOR CLOTS REPEATED TO VERIFY PLATELET COUNT CONFIRMED BY SMEAR    nRBC 0.0 0.0 - 0.2 %   Neutrophils Relative % 75 %   Neutro Abs 4.2 1.7 - 7.7 K/uL   Lymphocytes Relative 10 %   Lymphs Abs 0.6 (L) 0.7 - 4.0 K/uL   Monocytes Relative 13 %   Monocytes Absolute 0.7 0.1 - 1.0 K/uL   Eosinophils Relative 1 %   Eosinophils Absolute 0.1 0.0 - 0.5 K/uL   Basophils Relative 0 %   Basophils Absolute 0.0 0.0 - 0.1 K/uL   Immature Granulocytes 1 %   Abs Immature Granulocytes 0.04 0.00 - 0.07 K/uL    Comment:  Performed at [facility], [address] [place], [place] [address]  Comprehensive metabolic panel     Status: Abnormal   Collection Time: 01/05/24  8:17 AM  Result Value Ref Range   Sodium 130 (L) 135 - 145 mmol/L   Potassium 6.9 (HH) 3.5 - 5.1 mmol/L    Comment: HEMOLYSIS AT THIS LEVEL MAY AFFECT RESULT  Chloride 106 98 - 111 mmol/L   CO2 11 (L) 22 - 32 mmol/L   Glucose, Bld 91 70 - 99 mg/dL    Comment: Glucose reference range applies only to samples taken after fasting for at least 8 hours.   BUN 76 (H) 8 - 23 mg/dL   Creatinine, Ser 0.98 (H) 0.61 - 1.24 mg/dL   Calcium 7.7 (L) 8.9 - 10.3 mg/dL   Total Protein 5.8 (L) 6.5 - 8.1 g/dL   Albumin 2.4 (L) 3.5 - 5.0 g/dL   AST 26 15 - 41 U/L    Comment: HEMOLYSIS AT THIS LEVEL MAY AFFECT RESULT   ALT 14 0 - 44 U/L    Comment: HEMOLYSIS AT THIS LEVEL MAY AFFECT RESULT   Alkaline Phosphatase 46 38 - 126 U/L   Total Bilirubin 1.3 (H) 0.0 - 1.2 mg/dL    Comment: HEMOLYSIS AT THIS LEVEL MAY AFFECT RESULT   GFR, Estimated 7 (L) >60 mL/min    Comment: (NOTE) Calculated using the CKD-EPI Creatinine Equation (2021)    Anion gap 13 5 - 15    Comment: Performed at [facility], [address] [place], [place] [address]  Resp panel by RT-PCR (RSV, Flu A&B, Covid) Anterior Nasal Swab     Status: None   Collection Time: 01/05/24  8:17 AM   Specimen: Anterior Nasal Swab  Result Value Ref Range   SARS Coronavirus 2 by RT PCR NEGATIVE NEGATIVE    Comment: (NOTE) SARS-CoV-2 target nucleic acids are NOT DETECTED.  The SARS-CoV-2 RNA is generally detectable in upper respiratory specimens during the acute phase of infection. The lowest concentration of SARS-CoV-2 viral copies this assay can detect is 138 copies/mL. A negative result does not preclude SARS-Cov-2 infection and should not be used as the sole basis for treatment or other patient management decisions. A negative result may occur with  improper  specimen collection/handling, submission of specimen other than nasopharyngeal swab, presence of viral mutation(s) within the areas targeted by this assay, and inadequate number of viral copies(<138 copies/mL). A negative result must be combined with clinical observations, patient history, and epidemiological information. The expected result is Negative.  Fact Sheet for Patients:  BloggerCourse.com  Fact Sheet for Healthcare Providers:  SeriousBroker.it  This test is no t yet approved or cleared by the [place] FDA and  has been authorized for detection and/or diagnosis of SARS-CoV-2 by FDA under an Emergency Use Authorization (EUA). This EUA will remain  in effect (meaning this test can be used) for the duration of the COVID-19 declaration under Section 564(b)(1) of the Act, 21 U.S.C.section 360bbb-3(b)(1), unless the authorization is terminated  or revoked sooner.       Influenza A by PCR NEGATIVE NEGATIVE   Influenza B by PCR NEGATIVE NEGATIVE    Comment: (NOTE) The Xpert Xpress SARS-CoV-2/FLU/RSV plus assay is intended as an aid in the diagnosis of influenza from Nasopharyngeal swab specimens and should not be used as a sole basis for treatment. Nasal washings and aspirates are unacceptable for Xpert Xpress SARS-CoV-2/FLU/RSV testing.  Fact Sheet for Patients: BloggerCourse.com  Fact Sheet for Healthcare Providers: SeriousBroker.it  This test is not yet approved or cleared by the [place] FDA and has been authorized for detection and/or diagnosis of SARS-CoV-2 by FDA under an Emergency Use Authorization (EUA). This EUA will remain in effect (meaning this test can be used) for the duration of the COVID-19 declaration under Section 564(b)(1) of the Act, 21 U.S.C. section  360bbb-3(b)(1), unless the authorization is terminated or revoked.     Resp Syncytial Virus  by PCR NEGATIVE NEGATIVE    Comment: (NOTE) Fact Sheet for Patients: BloggerCourse.com  Fact Sheet for Healthcare Providers: SeriousBroker.it  This test is not yet approved or cleared by the [place] FDA and has been authorized for detection and/or diagnosis of SARS-CoV-2 by FDA under an Emergency Use Authorization (EUA). This EUA will remain in effect (meaning this test can be used) for the duration of the COVID-19 declaration under Section 564(b)(1) of the Act, 21 U.S.C. section 360bbb-3(b)(1), unless the authorization is terminated or revoked.  Performed at [facility], [address], [place], [place] [address]   Urinalysis, Routine w reflex microscopic -Urine, Clean Catch     Status: Abnormal   Collection Time: 01/05/24  8:33 AM  Result Value Ref Range   Color, Urine STRAW (A) YELLOW   APPearance CLEAR CLEAR   Specific Gravity, Urine 1.008 1.005 - 1.030   pH 5.0 5.0 - 8.0   Glucose, UA 50 (A) NEGATIVE mg/dL   Hgb urine dipstick MODERATE (A) NEGATIVE   Bilirubin Urine NEGATIVE NEGATIVE   Ketones, ur NEGATIVE NEGATIVE mg/dL   Protein, ur NEGATIVE NEGATIVE mg/dL   Nitrite NEGATIVE NEGATIVE   Leukocytes,Ua NEGATIVE NEGATIVE   RBC / HPF 0-5 0 - 5 RBC/hpf   WBC, UA 0-5 0 - 5 WBC/hpf   Bacteria, UA RARE (A) NONE SEEN   Squamous Epithelial / HPF 0-5 0 - 5 /HPF    Comment: Performed at [facility], [address], [place], [place] [address]  Protime-INR     Status: Abnormal   Collection Time: 01/05/24  8:34 AM  Result Value Ref Range   Prothrombin Time 20.3 (H) 11.4 - 15.2 seconds   INR 1.7 (H) 0.8 - 1.2    Comment: (NOTE) INR goal varies based on device and disease states. Performed at [facility], [address] [place], [place] [address]   APTT     Status: None   Collection Time: 01/05/24  8:34 AM  Result Value Ref Range   aPTT 35 24 - 36 seconds     Comment: Performed at [facility], [address], [place], [place] [address]  CBC     Status: Abnormal   Collection Time: 01/05/24  8:34 AM  Result Value Ref Range   WBC 13.6 (H) 4.0 - 10.5 K/uL   RBC 3.24 (L) 4.22 - 5.81 MIL/uL   Hemoglobin 10.4 (L) 13.0 - 17.0 g/dL    Comment: REPEATED TO VERIFY RECOLLECTED SAMPLE    HCT 32.8 (L) 39.0 - 52.0 %   MCV 101.2 (H) 80.0 - 100.0 fL   MCH 32.1 26.0 - 34.0 pg   MCHC 31.7 30.0 - 36.0 g/dL   RDW 29.5 62.1 - 30.8 %   Platelets 219 150 - 400 K/uL   nRBC 0.0 0.0 - 0.2 %    Comment: Performed at [facility], [address] [place], [place] [address]  POC occult blood, ED     Status: None   Collection Time: 01/05/24  8:48 AM  Result Value Ref Range   Fecal Occult Bld NEGATIVE NEGATIVE  Type and screen [facility]     Status: None   Collection Time: 01/05/24  8:50 AM  Result Value Ref Range   ABO/RH(D) A NEG    Antibody Screen NEG    Sample Expiration  01/08/2024,2359 Performed at [facility] [place], [address] [place], [place] [address]   Lipase, blood     Status: None   Collection Time: 01/05/24  8:50 AM  Result Value Ref Range   Lipase 29 11 - 51 U/L    Comment: Performed at [facility], [address], [place], [place] [address]  I-Stat Lactic Acid, ED     Status: None   Collection Time: 01/05/24  8:57 AM  Result Value Ref Range   Lactic Acid, Venous 0.9 0.5 - 1.9 mmol/L  Basic metabolic panel     Status: Abnormal   Collection Time: 01/05/24 11:08 AM  Result Value Ref Range   Sodium 133 (L) 135 - 145 mmol/L   Potassium 5.9 (H) 3.5 - 5.1 mmol/L   Chloride 106 98 - 111 mmol/L   CO2 16 (L) 22 - 32 mmol/L   Glucose, Bld 111 (H) 70 - 99 mg/dL    Comment: Glucose reference range applies only to samples taken after fasting for at least 8 hours.   BUN 78 (H) 8 - 23 mg/dL   Creatinine, Ser 1.60 (H) 0.61 - 1.24 mg/dL   Calcium 8.0 (L)  8.9 - 10.3 mg/dL   GFR, Estimated 7 (L) >60 mL/min    Comment: (NOTE) Calculated using the CKD-EPI Creatinine Equation (2021)    Anion gap 11 5 - 15    Comment: Performed at [facility], [address], [place], [place] [address]  I-Stat Lactic Acid, ED     Status: None   Collection Time: 01/05/24 11:18 AM  Result Value Ref Range   Lactic Acid, Venous 0.6 0.5 - 1.9 mmol/L  I-stat chem 8, ED (not at [facility], [facility] or [facility])     Status: Abnormal   Collection Time: 01/05/24 11:18 AM  Result Value Ref Range   Sodium 132 (L) 135 - 145 mmol/L   Potassium 5.9 (H) 3.5 - 5.1 mmol/L   Chloride 107 98 - 111 mmol/L   BUN 83 (H) 8 - 23 mg/dL   Creatinine, Ser 3.55 (H) 0.61 - 1.24 mg/dL   Glucose, Bld 732 (H) 70 - 99 mg/dL    Comment: Glucose reference range applies only to samples taken after fasting for at least 8 hours.   Calcium, Ion 1.13 (L) 1.15 - 1.40 mmol/L   TCO2 17 (L) 22 - 32 mmol/L   Hemoglobin 9.5 (L) 13.0 - 17.0 g/dL   HCT 20.2 (L) 54.2 - 70.6 %  CBG monitoring, ED     Status: None   Collection Time: 01/05/24 11:51 AM  Result Value Ref Range   Glucose-Capillary 97 70 - 99 mg/dL    Comment: Glucose reference range applies only to samples taken after fasting for at least 8 hours.   Recent Results (from the past 240 hours)  Resp panel by RT-PCR (RSV, Flu A&B, Covid) Anterior Nasal Swab     Status: None   Collection Time: 01/05/24  8:17 AM   Specimen: Anterior Nasal Swab  Result Value Ref Range Status   SARS Coronavirus 2 by RT PCR NEGATIVE NEGATIVE Final    Comment: (NOTE) SARS-CoV-2 target nucleic acids are NOT DETECTED.  The SARS-CoV-2 RNA is generally detectable in upper respiratory specimens during the acute phase of infection. The lowest concentration of SARS-CoV-2 viral copies this assay can detect is 138 copies/mL. A negative result does not preclude SARS-Cov-2 infection and should not be used as the sole basis for treatment or  other patient management  decisions. A negative result may occur with  improper specimen collection/handling, submission of specimen other than nasopharyngeal swab, presence of viral mutation(s) within the areas targeted by this assay, and inadequate number of viral copies(<138 copies/mL). A negative result must be combined with clinical observations, patient history, and epidemiological information. The expected result is Negative.  Fact Sheet for Patients:  BloggerCourse.com  Fact Sheet for Healthcare Providers:  SeriousBroker.it  This test is no t yet approved or cleared by the [place] FDA and  has been authorized for detection and/or diagnosis of SARS-CoV-2 by FDA under an Emergency Use Authorization (EUA). This EUA will remain  in effect (meaning this test can be used) for the duration of the COVID-19 declaration under Section 564(b)(1) of the Act, 21 U.S.C.section 360bbb-3(b)(1), unless the authorization is terminated  or revoked sooner.       Influenza A by PCR NEGATIVE NEGATIVE Final   Influenza B by PCR NEGATIVE NEGATIVE Final    Comment: (NOTE) The Xpert Xpress SARS-CoV-2/FLU/RSV plus assay is intended as an aid in the diagnosis of influenza from Nasopharyngeal swab specimens and should not be used as a sole basis for treatment. Nasal washings and aspirates are unacceptable for Xpert Xpress SARS-CoV-2/FLU/RSV testing.  Fact Sheet for Patients: BloggerCourse.com  Fact Sheet for Healthcare Providers: SeriousBroker.it  This test is not yet approved or cleared by the [place] FDA and has been authorized for detection and/or diagnosis of SARS-CoV-2 by FDA under an Emergency Use Authorization (EUA). This EUA will remain in effect (meaning this test can be used) for the duration of the COVID-19 declaration under Section 564(b)(1) of the Act, 21 U.S.C. section 360bbb-3(b)(1), unless the  authorization is terminated or revoked.     Resp Syncytial Virus by PCR NEGATIVE NEGATIVE Final    Comment: (NOTE) Fact Sheet for Patients: BloggerCourse.com  Fact Sheet for Healthcare Providers: SeriousBroker.it  This test is not yet approved or cleared by the [place] FDA and has been authorized for detection and/or diagnosis of SARS-CoV-2 by FDA under an Emergency Use Authorization (EUA). This EUA will remain in effect (meaning this test can be used) for the duration of the COVID-19 declaration under Section 564(b)(1) of the Act, 21 U.S.C. section 360bbb-3(b)(1), unless the authorization is terminated or revoked.  Performed at [facility], [address] [place], [place] [address]    Creatinine: Recent Labs    01/05/24 8119 01/05/24 1108 01/05/24 1118  CREATININE 6.63* 6.68* 7.30*   Baseline Creatinine: 2.0  Impression/Assessment:  89yo with left ureteral calculus and sepsis  Plan:  -We discussed the management of kidney stones. These options include observation, ureteroscopy, shockwave lithotripsy (ESWL) and percutaneous nephrolithotomy (PCNL). We discussed which options are relevant to the patient's stone(s). We discussed the natural history of kidney stones as well as the complications of untreated stones and the impact on quality of life without treatment as well as with each of the above listed treatments. We also discussed the efficacy of each treatment in its ability to clear the stone burden. With any of these management options I discussed the signs and symptoms of infection and the need for emergent treatment should these be experienced. For each option we discussed the ability of each procedure to clear the patient of their stone burden.   For observation I described the risks which include but are not limited to silent renal damage, life-threatening infection, need for emergent surgery,  failure to pass stone and pain.  For ureteroscopy I described the risks which include bleeding, infection, damage to contiguous structures, positioning injury, ureteral stricture, ureteral avulsion, ureteral injury, need for prolonged ureteral stent, inability to perform ureteroscopy, need for an interval procedure, inability to clear stone burden, stent discomfort/pain, heart attack, stroke, pulmonary embolus and the inherent risks with general anesthesia.   For shockwave lithotripsy I described the risks which include arrhythmia, kidney contusion, kidney hemorrhage, need for transfusion, pain, inability to adequately break up stone, inability to pass stone fragments, Steinstrasse, infection associated with obstructing stones, need for alternate surgical procedure, need for repeat shockwave lithotripsy, MI, CVA, PE and the inherent risks with anesthesia/conscious sedation.   For PCNL I described the risks including positioning injury, pneumothorax, hydrothorax, need for chest tube, inability to clear stone burden, renal laceration, arterial venous fistula or malformation, need for embolization of kidney, loss of kidney or renal function, need for repeat procedure, need for prolonged nephrostomy tube, ureteral avulsion, MI, CVA, PE and the inherent risks of general anesthesia.   - The patient would like to proceed with left ureteral stent   Wilkie Aye 01/05/2024, 12:11 PM

## 2024-01-05 NOTE — Plan of Care (Signed)
  Problem: Clinical Measurements: Goal: Diagnostic test results will improve Outcome: Progressing Goal: Respiratory complications will improve Outcome: Progressing   Problem: Clinical Measurements: Goal: Will remain free from infection Outcome: Not Progressing

## 2024-01-05 NOTE — H&P (Signed)
History and Physical    Patient: Charles Richard YNW:[id number] DOB: 16-Nov-1930 DOA: 01/05/2024 DOS: the patient was seen and examined on 01/05/2024 PCP: [facility]  Patient coming from: Home  Chief Complaint:  Chief Complaint  Patient presents with   Altered Mental Status   Cough   Flank Pain   HPI: Charles Richard is a 89 y.o. male with medical history significant of AAA, osteoarthritis, atrial fibrillation, COVID-19, type 2 diabetes, hypertension, impaired hearing, history of CVA, nephrolithiasis, hyperlipidemia, history of sepsis, bilateral heel ulcers, left-sided nephrolithiasis with hydronephrosis, UTI and urosepsis requiring left ureteral stent placement who was brought to the emergency department due to altered mental status, cough and a 7-day history of flank pain that has gotten significantly worse over the last 2 days.  The patient is still mildly confused so history is taken from the patient's daughter. He denied fever, chills, rhinorrhea, sore throat, wheezing or hemoptysis.  No chest pain, palpitations, diaphoresis, PND, orthopnea or pitting edema of the lower extremities.  No nausea, emesis, diarrhea, constipation, melena or hematochezia.  No dysuria, frequency or hematuria.  No polyuria, polydipsia, polyphagia or blurred vision.   Lab work: Urinalysis showed glucose of 50 mg/dL, moderate hemoglobin and rare bacteria microscopic examination.  CBC showed a white count of 13.6, hemoglobin 10.4 g/dL platelets 657.  PT 20.3, INR 1.7 and PTT 35.  Fecal occult blood was negative.  Lactic acid was normal twice.  BMP showed sodium 133, potassium 5.9, chloride 106 and CO2 16 mmol/L.  Glucose on 911, BUN 78 and creatinine 6.68 mg/dL.  Calcium normalizes to albumin level.  LFTs showed total protein of 5.8 and albumin of 2.4 g/dL, normal transaminases and alkaline phosphatase, total bilirubin was 1.3 mg/dL.  Imaging: Portable 1 view chest radiograph with no active disease.  CT  renal study showed moderate left hydroureteronephrosis due to a 6 mm calculus in the mid left ureter.  Additional 2 mm calculus in proximal left ureter, and 2 small calculi in the urinary bladder.  Bilateral nephrolithiasis.  Acute versus subacute compression fracture of the T11 vertebral body.  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  Stable small fat-containing midline ventral hernia.  ED course: Initial vital signs were temperature 100 F, pulse 93, respirations 24, BP 93/62 mmHg O2 sat 98% on room air.  The patient received calcium gluconate 1 g IVPB, ceftriaxone 2 g IVPB, dextrose 10% 250 mL IVPB, LR 2750 mL bolus and 1 amp of sodium bicarbonate.   Review of Systems: As mentioned in the history of present illness. All other systems reviewed and are negative. Past Medical History:  Diagnosis Date   AAA (abdominal aortic aneurysm) without rupture (HCC) 06/03/2017   Arthritis    Atrial fibrillation with rapid ventricular response (HCC) 04/2017   New diagnosis during admission for urosepsis secondary to left nephrolithiasis/ureteral stone with hydronephrosis   COVID-19    04/2020   Diabetes mellitus without complication (HCC)    Dysrhythmia    a fib   Essential hypertension    Hard of hearing    History of CVA (cerebrovascular accident) without residual deficits 05/2009   Admitted with expressive aphasia & confusion following a neurocardiogenic syncope event (related to dehydration, AKD & BB related hypotension/bradycardia)   History of kidney stones    Hyperlipidemia    Left nephrolithiasis 04/2017   s/p L Ureteral stent for L hydronephrosis & UTI/Urosepsis.  Myocardial infarction (HCC)    Sepsis (HCC)    Skin ulcers of foot, bilateral (HCC)    Stroke (HCC)    mild stroke no residuals   Past Surgical History:  Procedure Laterality Date   CHOLECYSTECTOMY     CYSTOSCOPY W/ URETERAL  STENT PLACEMENT Left 05/09/2017   Procedure: CYSTOSCOPY WITH RETROGRADE PYELOGRAM/URETERAL STENT PLACEMENT;  Surgeon: Jerilee Field, MD;  Location: [facility];  Service: Urology;  Laterality: Left;   CYSTOSCOPY WITH RETROGRADE PYELOGRAM, URETEROSCOPY AND STENT PLACEMENT Left 06/08/2017   Procedure: CYSTOSCOPY WITH LEFT RETROGRADE PYELOGRAM, URETEROSCOPY HOLMIUM LASER LITHOTRIPSY BASKET EXTRACTION  AND STENT EXCHANGE;  Surgeon: Jerilee Field, MD;  Location: [facility];  Service: Urology;  Laterality: Left;   EYE SURGERY     right   HOLMIUM LASER APPLICATION Left 06/08/2017   Procedure: HOLMIUM LASER APPLICATION;  Surgeon: Jerilee Field, MD;  Location: [facility];  Service: Urology;  Laterality: Left;   LITHOTRIPSY     REVERSE SHOULDER ARTHROPLASTY Right 07/20/2017   Procedure: REVERSE RIGHT SHOULDER ARTHROPLASTY;  Surgeon: Yolonda Kida, MD;  Location: [facility];  Service: Orthopedics;  Laterality: Right;  150 mins   Social History:  reports that he has never smoked. He has never used smokeless tobacco. He reports that he does not drink alcohol and does not use drugs.  No Known Allergies  Family History  Problem Relation Age of Onset   Lung cancer Father        black lung   CAD Neg Hx    Kidney Stones Neg Hx    Aneurysm Neg Hx     Prior to Admission medications   Medication Sig Start Date End Date Taking? Authorizing Provider  apixaban (ELIQUIS) 5 MG TABS tablet Take 2.5 mg by mouth 2 (two) times daily.   Yes [provider]  carvedilol (COREG) 6.25 MG tablet Take 1 tablet (6.25 mg total) by mouth 2 (two) times daily with a meal. Patient taking differently: Take 6.25 mg by mouth daily. 08/02/21  Yes Rodolph Bong, MD  cephALEXin (KEFLEX) 500 MG capsule Take 1 capsule (500 mg total) by mouth 4 (four) times daily. Patient taking differently: Take 500 mg by mouth 2 (two) times daily. 11/13/21  Yes [place], Rolly Salter E, FNP  Cholecalciferol (VITAMIN D) 50 MCG (2000 UT) tablet Take  2,000 Units by mouth daily.   Yes [provider]  docusate sodium (COLACE) 100 MG capsule Take 100 mg by mouth daily.   Yes [provider]  fluticasone (FLONASE) 50 MCG/ACT nasal spray Place 1 spray into both nostrils daily as needed for allergies.   Yes [provider]  gabapentin (NEURONTIN) 300 MG capsule Take 300 mg by mouth 2 (two) times daily.   Yes [provider]  loratadine (CLARITIN) 10 MG tablet Take 10 mg by mouth daily with breakfast.    Yes [provider]  nystatin-triamcinolone (MYCOLOG II) cream APPLY SMALL AMOUNT TO AFFECTED AREA DAILY 09/18/22  Yes [provider]  SITagliptin (ZITUVIO) 25 MG TABS Take 25 mg by mouth daily.   Yes [provider]  Glucerna (GLUCERNA) LIQD Take 237 mLs by mouth 2 (two) times daily between meals.    [provider]  latanoprost (XALATAN) 0.005 % ophthalmic solution Place 1 drop into both eyes at bedtime.    [provider]  lisinopril (ZESTRIL) 20 MG tablet Take 20 mg by mouth daily.    [provider]  meclizine (ANTIVERT) 25 MG tablet Take 25 mg by  mouth daily.    [provider]  metFORMIN (GLUCOPHAGE) 500 MG tablet Take 500 mg by mouth 2 (two) times daily with a meal.    [provider]  Multiple Vitamin (MULTIVITAMIN WITH MINERALS) TABS tablet Take 1 tablet by mouth daily.    [provider]  ondansetron (ZOFRAN) 8 MG tablet Take 4 mg by mouth every 12 (twelve) hours as needed for nausea or vomiting.    [provider]  Propylene Glycol 0.6 % SOLN Place 1-2 drops into both eyes daily as needed (for dry eyes).    [provider]  senna-docusate (SENOKOT-S) 8.6-50 MG tablet Take 1 tablet by mouth 2 (two) times daily. 08/02/21   Rodolph Bong, MD  simvastatin (ZOCOR) 20 MG tablet Take 10 mg by mouth daily.    [provider]  tamsulosin (FLOMAX) 0.4 MG CAPS capsule Take 0.4 mg by mouth daily.     [provider]  traMADol (ULTRAM) 50 MG tablet Take 50-100 mg by mouth at bedtime as needed for moderate pain.    [provider]  vitamin B-12 (CYANOCOBALAMIN) 1000 MCG tablet Take 1 tablet (1,000 mcg total) by mouth daily. 08/02/21   Rodolph Bong, MD  zolpidem (AMBIEN) 10 MG tablet Take 10 mg by mouth at bedtime.    [provider]    Physical Exam: Vitals:   01/05/24 0834 01/05/24 0900 01/05/24 0915 01/05/24 0930  BP:  91/69 98/62 90/62   Pulse: 86 85 88 79  Resp: (!) 24     Temp:      TempSrc:      SpO2: 100% 100% 99% 90%  Weight:      Height:       Physical Exam Vitals and nursing note reviewed.  Constitutional:      General: He is awake. He is not in acute distress.    Appearance: Normal appearance. He is obese. He is ill-appearing. He is not diaphoretic.  HENT:     Head: Normocephalic.     Nose: No rhinorrhea.     Mouth/Throat:     Mouth: Mucous membranes are dry.  Eyes:     General: No scleral icterus.    Pupils: Pupils are equal, round, and reactive to light.  Neck:     Vascular: No JVD.  Cardiovascular:     Rate and Rhythm: Regular rhythm.     Heart sounds: S1 normal and S2 normal.  Pulmonary:     Breath sounds: No wheezing, rhonchi or rales.  Chest:     Chest wall: No tenderness.  Abdominal:     General: Bowel sounds are normal.     Tenderness: There is abdominal tenderness. There is left CVA tenderness. There is no right CVA tenderness.  Musculoskeletal:     Cervical back: Neck supple.     Right lower leg: No edema.     Left lower leg: No edema.  Neurological:     Mental Status: He is alert. He is disoriented.  Psychiatric:        Mood and Affect: Mood normal.        Behavior: Behavior normal. Behavior is cooperative.     Data Reviewed:  Results are pending, will review when available. 07/31/2021 transthoracic echocardiogram. IMPRESSIONS:   1. Left ventricular ejection fraction, by estimation, is 55 to 60%. The   left ventricle has normal function. The left ventricle has no regional  wall motion abnormalities. There is moderate left ventricular hypertrophy.  Left ventricular diastolic  parameters are indeterminate.   2. Right ventricular systolic function is normal. The right ventricular  size is normal. There is normal pulmonary artery systolic pressure. The  estimated right ventricular systolic pressure is 19.6 mmHg.   3. Left atrial size was mildly dilated.   4. The mitral valve is grossly normal. Trivial mitral valve  regurgitation.   5. The aortic valve is tricuspid. Aortic valve regurgitation is not  visualized. Mild aortic valve sclerosis is present, with no evidence of  aortic valve stenosis.   6. The inferior vena cava is normal in size with greater than 50%  respiratory variability, suggesting right atrial pressure of 3 mmHg.   EKG: Vent. rate 99 BPM PR interval * ms QRS duration 100 ms QT/QTcB 338/432 ms P-R-T axes * 15 44 Atrial fibrillation Multiple ventricular premature complexes Low voltage, extremity and precordial leads Anteroseptal infarct, age indeterminate  Assessment and Plan: Principal Problem:   Acute kidney injury superimposed on stage 3b chronic kidney disease (HCC) In the setting of:   Ureteral stone with hydronephrosis   Hydroureter on left Observation/telemetry. Continue IV fluids. Hold lisinopril 40 mg daily at bedtime. Hold sitagliptin. Avoid hypotension. Avoid nephrotoxins. Monitor intake and output. Monitor renal function/electrolytes. Urology consult/procedure appreciated. Nephrology consult appreciated.  Active Problems:   Hyperkalemia Nephrology consult appreciated. -Will follow their recommendations. Follow-up potassium level in AM.    Hyponatremia Continue IV fluids. Follow-up sodium level in AM.    Sepsis secondary to UTI [facility]) Admit to SDU/inpatient. Continue IV fluids. Continue ceftriaxone 2 g IVPB every 24 hours. Follow-up  blood culture and sensitivity. Follow-up urine culture and sensitivity. Follow CBC and CMP in a.m.    Essential hypertension Blood pressures have been soft. Hold antihypertensives.    Permanent atrial fibrillation (HCC) CHA?DS?-VASc Score of at least 7. (Has all other risk factors, but confirmed CHF) Resume DOAC per urology. Follow-up PT and INR in the morning. Carvedilol on hold due to soft blood pressures.    Protein-calorie malnutrition, severe (HCC) In the setting of anemia and acute illness. May benefit from protein supplementation. Consider nutritional services evaluation. Follow-up albumin level.    Coronary arteriosclerosis Continue statin. Holding beta-blocker and apixaban.    Hyperlipidemia Continue simvastatin 20 mg p.o. daily.    Type 2 diabetes mellitus (HCC) Carbohydrate modified diet. CBG monitoring 4 times daily. Holding sitagliptin due to UTI. Check hemoglobin A1c.    Physical deconditioning Consult PT and OT.    Class 1 obesity Current BMI 32.59 kg/m. Lifestyle modifications. Follow-up with closely PCP and/or bariatric clinic.     Advance Care Planning:   Code Status: Do not attempt resuscitation (DNR) PRE-ARREST INTERVENTIONS DESIRED   Consults: Urology Wilkie Aye, MD) and nephrology Delano Metz, MD).  Family Communication:   Severity of Illness: The appropriate patient status for this patient is INPATIENT. Inpatient status is judged to be reasonable and necessary in order to provide the required intensity of service to ensure the patient's safety. The patient's presenting symptoms, physical exam findings, and initial radiographic and laboratory data in the context of their chronic comorbidities is felt to place them at high risk for further clinical deterioration. Furthermore, it is not anticipated that the patient will be medically stable for discharge from the hospital within 2 midnights of admission.   * I certify that at the  point of admission it is my clinical judgment that the patient will require inpatient hospital care spanning beyond 2 midnights from the point of admission due to high intensity  of service, high risk for further deterioration and high frequency of surveillance required.*  Author: Bobette Mo, MD 01/05/2024 12:02 PM  For on call review [email or web address].   This document was prepared using Dragon voice recognition software and may contain some unintended transcription errors.

## 2024-01-05 NOTE — Op Note (Signed)
.  Preoperative diagnosis: Left ureteral stone  Postoperative diagnosis: Same  Procedure: 1 cystoscopy 2. Left retrograde pyelography 3.  Intraoperative fluoroscopy, under one hour, with interpretation 4. Left 6 x 26 JJ stent placement  Attending: Wilkie Aye  Anesthesia: General  Estimated blood loss: None  Drains: Left 6 x 26 JJ ureteral stent without tether, 16 French foley catheter  Specimens: none  Antibiotics: rocephin  Findings: left mid ureteral stone. Moderate hydronephrosis. No masses/lesions in the bladder. Ureteral orifices in normal anatomic location.  Indications: Patient is a 89 year old male with a history of left ureteral stone and concern for sepsis.  After discussing treatment options, they decided proceed with left stent placement.  Procedure her in detail: The patient was brought to the operating room and a brief timeout was done to ensure correct patient, correct procedure, correct site.  General anesthesia was administered patient was placed in dorsal lithotomy position.  Their genitalia was then prepped and draped in usual sterile fashion.  A rigid 22 French cystoscope was passed in the urethra and the bladder.  Bladder was inspected free masses or lesions.  the ureteral orifices were in the normal orthotopic locations.  a 6 french ureteral catheter was then instilled into the left ureteral orifice.  a gentle retrograde was obtained and findings noted above.  we then placed a zip wire through the ureteral catheter and advanced up to the renal pelvis.    We then placed a 6 x 26 double-j ureteral stent over the original zip wire.  We then removed the wire and good coil was noted in the the renal pelvis under fluoroscopy and the bladder under direct vision.  A foley catheter was then placed. the bladder was then drained and this concluded the procedure which was well tolerated by patient.  Complications: None  Condition: Stable, extubated, transferred to  PACU  Plan: Patient is to be admitted for IV antibiotics. He will have his stone extraction in 2 weeks.

## 2024-01-05 NOTE — Anesthesia Postprocedure Evaluation (Signed)
Anesthesia Post Note  Patient: Charles Richard  Procedure(s) Performed: CYSTOSCOPY WITH RETROGRADE PYELOGRAM/URETERAL STENT PLACEMENT (Left: Ureter)     Patient location during evaluation: PACU Anesthesia Type: General Level of consciousness: awake and alert, oriented and patient cooperative Pain management: pain level controlled Vital Signs Assessment: post-procedure vital signs reviewed and stable Respiratory status: spontaneous breathing, nonlabored ventilation and respiratory function stable Cardiovascular status: blood pressure returned to baseline and stable Postop Assessment: no apparent nausea or vomiting Anesthetic complications: no   No notable events documented.  Last Vitals:  Vitals:   01/05/24 1437 01/05/24 1447  BP: 110/72 116/69  Pulse: 84 80  Resp: 19 17  Temp:    SpO2: 100% 100%    Last Pain:  Vitals:   01/05/24 1437  TempSrc:   PainSc: 0-No pain                 Lannie Fields

## 2024-01-06 ENCOUNTER — Encounter (HOSPITAL_COMMUNITY): Payer: Self-pay | Admitting: Urology

## 2024-01-06 DIAGNOSIS — N1832 Chronic kidney disease, stage 3b: Secondary | ICD-10-CM

## 2024-01-06 DIAGNOSIS — N179 Acute kidney failure, unspecified: Secondary | ICD-10-CM

## 2024-01-06 DIAGNOSIS — N200 Calculus of kidney: Secondary | ICD-10-CM | POA: Diagnosis not present

## 2024-01-06 LAB — RENAL FUNCTION PANEL
Albumin: 2.1 g/dL — ABNORMAL LOW (ref 3.5–5.0)
Anion gap: 10 (ref 5–15)
BUN: 76 mg/dL — ABNORMAL HIGH (ref 8–23)
CO2: 16 mmol/L — ABNORMAL LOW (ref 22–32)
Calcium: 8.3 mg/dL — ABNORMAL LOW (ref 8.9–10.3)
Chloride: 107 mmol/L (ref 98–111)
Creatinine, Ser: 6.12 mg/dL — ABNORMAL HIGH (ref 0.61–1.24)
GFR, Estimated: 8 mL/min — ABNORMAL LOW (ref >60)
Glucose, Bld: 150 mg/dL — ABNORMAL HIGH (ref 70–99)
Phosphorus: 6.4 mg/dL — ABNORMAL HIGH (ref 2.5–4.6)
Potassium: 5.5 mmol/L — ABNORMAL HIGH (ref 3.5–5.1)
Sodium: 133 mmol/L — ABNORMAL LOW (ref 135–145)

## 2024-01-06 LAB — GLUCOSE, CAPILLARY
Glucose-Capillary: 106 mg/dL — ABNORMAL HIGH (ref 70–99)
Glucose-Capillary: 126 mg/dL — ABNORMAL HIGH (ref 70–99)
Glucose-Capillary: 128 mg/dL — ABNORMAL HIGH (ref 70–99)
Glucose-Capillary: 135 mg/dL — ABNORMAL HIGH (ref 70–99)
Glucose-Capillary: 138 mg/dL — ABNORMAL HIGH (ref 70–99)
Glucose-Capillary: 207 mg/dL — ABNORMAL HIGH (ref 70–99)
Glucose-Capillary: 230 mg/dL — ABNORMAL HIGH (ref 70–99)

## 2024-01-06 LAB — CBC
HCT: 30.2 % — ABNORMAL LOW (ref 39.0–52.0)
Hemoglobin: 9.7 g/dL — ABNORMAL LOW (ref 13.0–17.0)
MCH: 32.2 pg (ref 26.0–34.0)
MCHC: 32.1 g/dL (ref 30.0–36.0)
MCV: 100.3 fL — ABNORMAL HIGH (ref 80.0–100.0)
Platelets: 200 10*3/uL (ref 150–400)
RBC: 3.01 MIL/uL — ABNORMAL LOW (ref 4.22–5.81)
RDW: 12.3 % (ref 11.5–15.5)
WBC: 10.8 10*3/uL — ABNORMAL HIGH (ref 4.0–10.5)
nRBC: 0 % (ref 0.0–0.2)

## 2024-01-06 LAB — HEMOGLOBIN A1C
Hgb A1c MFr Bld: 6.5 % — ABNORMAL HIGH (ref 4.8–5.6)
Mean Plasma Glucose: 139.85 mg/dL

## 2024-01-06 LAB — LACTIC ACID, PLASMA: Lactic Acid, Venous: <0.3 mmol/L — ABNORMAL LOW (ref 0.5–1.9)

## 2024-01-06 LAB — POTASSIUM: Potassium: 5.5 mmol/L — ABNORMAL HIGH (ref 3.5–5.1)

## 2024-01-06 LAB — PROTIME-INR
INR: 1.6 — ABNORMAL HIGH (ref 0.8–1.2)
Prothrombin Time: 19.7 s — ABNORMAL HIGH (ref 11.4–15.2)

## 2024-01-06 MED ORDER — CALCIUM GLUCONATE-NACL 1-0.675 GM/50ML-% IV SOLN
1.0000 g | Freq: Once | INTRAVENOUS | Status: AC
  Administered 2024-01-06: 1000 mg via INTRAVENOUS
  Filled 2024-01-06: qty 50

## 2024-01-06 MED ORDER — LACTATED RINGERS IV SOLN: INTRAVENOUS | Status: AC

## 2024-01-06 MED ORDER — LACTATED RINGERS IV BOLUS: 500.0000 mL | Freq: Once | INTRAVENOUS | Status: DC

## 2024-01-06 MED ORDER — MIRABEGRON ER 25 MG PO TB24
25.0000 mg | ORAL_TABLET | Freq: Every day | ORAL | Status: DC
  Administered 2024-01-06 – 2024-01-07 (×2): 25 mg via ORAL
  Filled 2024-01-06 (×2): qty 1

## 2024-01-06 MED ORDER — APIXABAN 2.5 MG PO TABS
2.5000 mg | ORAL_TABLET | Freq: Two times a day (BID) | ORAL | Status: DC
  Administered 2024-01-06 – 2024-01-09 (×7): 2.5 mg via ORAL
  Filled 2024-01-06 (×7): qty 1

## 2024-01-06 MED ORDER — SODIUM CHLORIDE 0.9 % IV BOLUS
500.0000 mL | Freq: Once | INTRAVENOUS | Status: AC
  Administered 2024-01-06: 500 mL via INTRAVENOUS

## 2024-01-06 MED ORDER — DEXTROSE 50 % IV SOLN
50.0000 mL | Freq: Once | INTRAVENOUS | Status: AC
  Administered 2024-01-06: 50 mL via INTRAVENOUS
  Filled 2024-01-06: qty 50

## 2024-01-06 MED ORDER — INSULIN ASPART 100 UNIT/ML IV SOLN
10.0000 [IU] | Freq: Once | INTRAVENOUS | Status: AC
  Administered 2024-01-06: 10 [IU] via INTRAVENOUS

## 2024-01-06 MED ORDER — ZOLPIDEM TARTRATE 5 MG PO TABS
10.0000 mg | ORAL_TABLET | Freq: Every day | ORAL | Status: DC
  Administered 2024-01-06 – 2024-01-08 (×3): 10 mg via ORAL
  Filled 2024-01-06 (×3): qty 2

## 2024-01-06 NOTE — Hospital Course (Addendum)
89 yom w/ history of AAA, osteoarthritis, atrial fibrillation, hx of COVID-19, type 2 diabetes, hypertension, impaired hearing, history of CVA, hyperlipidemia, history of sepsis, bilateral heel ulcers, left-sided nephrolithiasis with hydronephrosis, UTI and urosepsis requiring left ureteral stent placement who was brought to the ED due to altered mental status, cough and a 7-day history of flank pain that has gotten significantly worse over the last 2 days. In the ED: BP soft in 90s, workup showed leukocytosis significant AKI hyperkalemia creat 6.6 b/l 1.5-2.0, CT abdomen showed obstruction of kidney with a stone on the left with a left-sided hydronephrosis.  Urology consulted IV antibiotic IV fluids started and admitted Patient underwent emergent cystoscopy with left JJ stent placement and was admitted to ICU/stepdown. Creatinine nicely improving with IV fluids, patient taking p.o. well IV fluids discontinued 1/28 and nephrology has cleared the patient for discharge home with follow-up at [facility]. urology advised to keep the Foley catheter x 7 days postprocedure-follow-up outpatient for TOV, patient will need left ureteroscopic stone extraction in 2 to 3 weeks.  At this time patient is doing well, Daughter Feels patient will doing much better at home, nephrology is cleared the patient for discharge including urology with Foley catheter with oral antibiotics

## 2024-01-06 NOTE — Progress Notes (Signed)
1 Day Post-Op Subjective: Patient reports no fevers overnight. Left flank pain improved.   Objective: Vital signs in last 24 hours: Temp:  [97.4 F (36.3 C)-97.8 F (36.6 C)] 97.5 F (36.4 C) (01/26 1600) Pulse Rate:  [71-154] 93 (01/26 1800) Resp:  [9-27] 21 (01/26 1800) BP: (70-168)/(34-143) 168/143 (01/26 1800) SpO2:  [86 %-100 %] 100 % (01/26 1800) Weight:  [111.2 kg] 111.2 kg (01/26 0500)  Intake/Output from previous day: 01/25 0701 - 01/26 0700 In: 2584.7 [I.V.:1205.3; IV Piggyback:1379.3] Out: 1652 [Urine:1650; Blood:2] Intake/Output this shift: No intake/output data recorded.  Physical Exam:  General:alert, cooperative, and appears stated age GI: soft, non tender, normal bowel sounds, no palpable masses, no organomegaly, no inguinal hernia Male genitalia: not done Extremities: extremities normal, atraumatic, no cyanosis or edema  Lab Results: Recent Labs    01/05/24 1118 01/05/24 1321 01/06/24 0454  HGB 9.5* 9.5* 9.7*  HCT 28.0* 28.0* 30.2*   BMET Recent Labs    01/05/24 1108 01/05/24 1118 01/05/24 1321 01/05/24 1659 01/06/24 0324 01/06/24 0454  NA 133*   < > 133*  --  133*  --   K 5.9*   < > 5.2*   < > 5.5* 5.5*  CL 106   < > 107  --  107  --   CO2 16*  --   --   --  16*  --   GLUCOSE 111*   < > 125*  --  150*  --   BUN 78*   < > 79*  --  76*  --   CREATININE 6.68*   < > 6.90*  --  6.12*  --   CALCIUM 8.0*  --   --   --  8.3*  --    < > = values in this interval not displayed.   Recent Labs    01/05/24 0834 01/06/24 0454  INR 1.7* 1.6*   No results for input(s): "LABURIN" in the last 72 hours. Results for orders placed or performed during the hospital encounter of 01/05/24  Resp panel by RT-PCR (RSV, Flu A&B, Covid) Anterior Nasal Swab     Status: None   Collection Time: 01/05/24  8:17 AM   Specimen: Anterior Nasal Swab  Result Value Ref Range Status   SARS Coronavirus 2 by RT PCR NEGATIVE NEGATIVE Final    Comment: (NOTE) SARS-CoV-2  target nucleic acids are NOT DETECTED.  The SARS-CoV-2 RNA is generally detectable in upper respiratory specimens during the acute phase of infection. The lowest concentration of SARS-CoV-2 viral copies this assay can detect is 138 copies/mL. A negative result does not preclude SARS-Cov-2 infection and should not be used as the sole basis for treatment or other patient management decisions. A negative result may occur with  improper specimen collection/handling, submission of specimen other than nasopharyngeal swab, presence of viral mutation(s) within the areas targeted by this assay, and inadequate number of viral copies(<138 copies/mL). A negative result must be combined with clinical observations, patient history, and epidemiological information. The expected result is Negative.  Fact Sheet for Patients:  BloggerCourse.com  Fact Sheet for Healthcare Providers:  SeriousBroker.it  This test is no t yet approved or cleared by the [place] FDA and  has been authorized for detection and/or diagnosis of SARS-CoV-2 by FDA under an Emergency Use Authorization (EUA). This EUA will remain  in effect (meaning this test can be used) for the duration of the COVID-19 declaration under Section 564(b)(1) of the Act, 21 U.S.C.section 360bbb-3(b)(1), unless  the authorization is terminated  or revoked sooner.       Influenza A by PCR NEGATIVE NEGATIVE Final   Influenza B by PCR NEGATIVE NEGATIVE Final    Comment: (NOTE) The Xpert Xpress SARS-CoV-2/FLU/RSV plus assay is intended as an aid in the diagnosis of influenza from Nasopharyngeal swab specimens and should not be used as a sole basis for treatment. Nasal washings and aspirates are unacceptable for Xpert Xpress SARS-CoV-2/FLU/RSV testing.  Fact Sheet for Patients: BloggerCourse.com  Fact Sheet for Healthcare  Providers: SeriousBroker.it  This test is not yet approved or cleared by the [place] FDA and has been authorized for detection and/or diagnosis of SARS-CoV-2 by FDA under an Emergency Use Authorization (EUA). This EUA will remain in effect (meaning this test can be used) for the duration of the COVID-19 declaration under Section 564(b)(1) of the Act, 21 U.S.C. section 360bbb-3(b)(1), unless the authorization is terminated or revoked.     Resp Syncytial Virus by PCR NEGATIVE NEGATIVE Final    Comment: (NOTE) Fact Sheet for Patients: BloggerCourse.com  Fact Sheet for Healthcare Providers: SeriousBroker.it  This test is not yet approved or cleared by the [place] FDA and has been authorized for detection and/or diagnosis of SARS-CoV-2 by FDA under an Emergency Use Authorization (EUA). This EUA will remain in effect (meaning this test can be used) for the duration of the COVID-19 declaration under Section 564(b)(1) of the Act, 21 U.S.C. section 360bbb-3(b)(1), unless the authorization is terminated or revoked.  Performed at [facility], [address] [place], [place] [address]   Blood Culture (routine x 2)     Status: None (Preliminary result)   Collection Time: 01/05/24  8:34 AM   Specimen: BLOOD RIGHT ARM  Result Value Ref Range Status   Specimen Description   Final    BLOOD RIGHT ARM Performed at [facility], [address] [place], [place] [address]    Special Requests   Final    BOTTLES DRAWN AEROBIC AND ANAEROBIC Blood Culture results may not be optimal due to an inadequate volume of blood received in culture bottles Performed at [facility], [address], [place], [place] [address]    Culture   Final    NO GROWTH < 24 HOURS Performed at [facility], [address] [place], [place] [address]    Report Status PENDING   Incomplete  Blood Culture (routine x 2)     Status: None (Preliminary result)   Collection Time: 01/05/24  9:00 AM   Specimen: BLOOD LEFT ARM  Result Value Ref Range Status   Specimen Description   Final    BLOOD LEFT ARM Performed at [facility], [address] [place], [place] [address]    Special Requests   Final    BOTTLES DRAWN AEROBIC AND ANAEROBIC Blood Culture results may not be optimal due to an inadequate volume of blood received in culture bottles Performed at [facility], [address] [place], [place] [address]    Culture   Final    NO GROWTH < 24 HOURS Performed at [facility], [address] [place], [place] [address]    Report Status PENDING  Incomplete    Studies/Results: DG C-Arm 1-60 Min-No Report Result Date: 01/05/2024 Fluoroscopy was utilized by the requesting physician.  No radiographic interpretation.   CT Renal Stone Study Result Date: 01/05/2024 CLINICAL DATA:  Abdominal and flank pain.  Nephrolithiasis. EXAM:  CT ABDOMEN AND PELVIS WITHOUT CONTRAST TECHNIQUE: Multidetector CT imaging of the abdomen and pelvis was performed following the standard protocol without IV contrast. RADIATION DOSE REDUCTION: This exam was performed according to the departmental dose-optimization program which includes automated exposure control, adjustment of the mA and/or kV according to patient size and/or use of iterative reconstruction technique. COMPARISON:  07/30/2021 FINDINGS: Lower chest: No acute findings. Hepatobiliary:  No mass visualized on this unenhanced exam. Pancreas: No mass or inflammatory process visualized on this unenhanced exam. Spleen:  Within normal limits in size. Adrenals/Urinary tract: Stable right renal benign Bosniak category 1 and 2 renal cysts again seen. Several renal calculi are seen bilaterally largest measuring 1 cm. Moderate left hydroureteronephrosis is seen due to a 6 mm calculus in the mid left ureter near the  iliac vessels. A 2 mm calculus is also seen in the proximal left ureter and 2 small calculi are also seen in the urinary bladder. Stomach/Bowel: No evidence of obstruction, inflammatory process, or abnormal fluid collections. Vascular/Lymphatic: No evidence of abdominal aortic aneurysm. 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 No acute vascular calcification or lymphadenopathy identified. Reproductive:  No mass or other significant abnormality. Other:   Small fat-containing midline ventral hernia is stable. Musculoskeletal: No suspicious bone lesions identified. Mild T11 vertebral body fracture is seen which appears acute or subacute in age. IMPRESSION: Moderate left hydroureteronephrosis due to 6 mm calculus in mid left ureter. Additional 2 mm calculus in proximal left ureter, and 2 small calculi in urinary bladder. Bilateral nephrolithiasis. Acute versus subacute compression fracture of the T11 vertebral body. 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 Stable small fat-containing midline ventral hernia. Electronically Signed   By: Danae Orleans M.D.   On: 01/05/2024 10:55   DG Chest Portable 1 View Result Date: 01/05/2024 CLINICAL DATA:  Cough. EXAM: PORTABLE CHEST 1 VIEW COMPARISON:  None Available. FINDINGS: The heart size and mediastinal contours are within normal limits. Both lungs are clear. Right shoulder prosthesis noted. IMPRESSION: No active disease. Electronically Signed   By: Danae Orleans M.D.   On: 01/05/2024 09:16    Assessment/Plan: POD #1 left ureteral stent placement 1. Please continue broad spectrum antibiotics pending urine culture 2. The patient will be scheduled for left ureteroscopic stone extraction in the next 2-3 weeks   LOS: 1 day   Wilkie Aye 01/06/2024, 7:20 PM

## 2024-01-06 NOTE — Plan of Care (Signed)
  Problem: Clinical Measurements: Goal: Respiratory complications will improve Outcome: Progressing   Problem: Nutrition: Goal: Adequate nutrition will be maintained Outcome: Progressing   Problem: Elimination: Goal: Will not experience complications related to urinary retention Outcome: Progressing   Problem: Skin Integrity: Goal: Risk for impaired skin integrity will decrease Outcome: Progressing   Problem: Education: Goal: Knowledge of General Education information will improve Description: Including pain rating scale, medication(s)/side effects and non-pharmacologic comfort measures Outcome: Not Progressing   Problem: Clinical Measurements: Goal: Ability to maintain clinical measurements within normal limits will improve Outcome: Not Progressing Goal: Cardiovascular complication will be avoided Outcome: Not Progressing   Problem: Activity: Goal: Risk for activity intolerance will decrease Outcome: Not Progressing   Problem: Pain Managment: Goal: General experience of comfort will improve and/or be controlled Outcome: Not Progressing   Problem: Safety: Goal: Ability to remain free from injury will improve Outcome: Not Progressing

## 2024-01-06 NOTE — Progress Notes (Signed)
[facility] Progress Note  Subjective: seen in room, creat down to 6.1 from peak 6.9. UOP good, 1.2 L yest and 1.1 L today so far. K+ down to mid 5s today.   Vitals:   01/06/24 1027 01/06/24 1028 01/06/24 1029 01/06/24 1200  BP:    (!) 103/46  Pulse: 90 91 87 71  Resp: 19 18 17 19   Temp:    97.7 F (36.5 C)  TempSrc:    Oral  SpO2: 100% 100% 100% 100%  Weight:      Height:        Exam: Gen alert, no distress, elderly male  Sclera anicteric, throat clear  No jvd or bruits Chest clear bilat to bases RRR no MRG Abd soft ntnd no mass or ascites +bs Ext no LE or UE edema Neuro is alert, Ox 3 , nf, no asterixis      Renal-related home meds: - coreg 6.25 bid - lisinopril 20 every day - metformin 500 bid    Date                             Creat               eGFR (ml/min) 2010                            1.00 2018                            1.0- 1.5 2021-2022                   1.5- 2.0            31- 44 ml/min, CKD 3b 01/05/24                        6.63                 7 ml/min               UA prot neg, 0-5 rbc/ wbc/ epi    CT renal stone - moderate L hydroureteronephrosis w/ 6 mm stone in mid left ureter. Both kidneys are on the small side and atrophic.     CXR - no active disease    Assessment/ Plan: AKI on CKD 3b - b/l creat from 2022 was 1.5- 2.0, eGFR 31- 44 ml/min. Creat here was 6.6 on 1/25. Ct showed ureteral stone and L hydronephrosis. UA negative. On exam euvolemic was a bit dry. Urology consulted and placed L ureteral stent 1/25. Dtr says that his recent labs (per the [place]) are creat in the mid 2's. UA is negative.  Good UOP post procedure (L ureteral stent placement) per urology. Will cont IVF"s.  Hyperkalemia - K+ 6.9 initially, sp temp measures and lokelma/ renal diet. 5.5 today. Cont lokelma, renal diet.  Volume - looks euvolemic on exam. CXR clear. Cont IVF"s at 75 cc/hr.            [name]  [facility] 01/06/2024, 4:38 PM  Recent Labs   Lab 01/05/24 0817 01/05/24 0834 01/05/24 1108 01/05/24 1118 01/05/24 1321 01/05/24 1659 01/06/24 0324 01/06/24 0454  HGB 3.7*   < >  --    < > 9.5*  --   --  9.7*  ALBUMIN 2.4*  --   --   --   --   --  2.1*  --   CALCIUM 7.7*  --  8.0*  --   --   --  8.3*  --   PHOS  --   --   --   --   --   --  6.4*  --   CREATININE 6.63*  --  6.68*   < > 6.90*  --  6.12*  --   K 6.9*  --  5.9*   < > 5.2*   < > 5.5* 5.5*   < > = values in this interval not displayed.   No results for input(s): "IRON", "TIBC", "FERRITIN" in the last 168 hours. Inpatient medications:  apixaban  2.5 mg Oral BID   Chlorhexidine Gluconate Cloth  6 each Topical Daily   docusate sodium  100 mg Oral Daily   gabapentin  300 mg Oral QHS   latanoprost  1 drop Both Eyes QHS   loratadine  10 mg Oral Q breakfast   meclizine  25 mg Oral Daily   mirabegron ER  25 mg Oral Daily   polyvinyl alcohol  1 drop Both Eyes Daily   senna-docusate  1 tablet Oral Daily   simvastatin  10 mg Oral Daily   sodium zirconium cyclosilicate  10 g Oral BID   zolpidem  10 mg Oral QHS    cefTRIAXone (ROCEPHIN)  IV Stopped (01/06/24 1048)   lactated ringers 75 mL/hr at 01/06/24 1052   acetaminophen **OR** acetaminophen, albuterol, ondansetron **OR** ondansetron (ZOFRAN) IV

## 2024-01-06 NOTE — Progress Notes (Signed)
Patient complained of bladder fullness and urine leaking around foley. Bladder scan showed 288 cc in bladder. 40 cc flushed through port and only 5 cc's flowed out. Seal broken on foley. 50cc flushed and pulled back. Significant mucus contents seen in syringe. Foley began to drain appropriately.

## 2024-01-06 NOTE — Progress Notes (Signed)
PROGRESS NOTE Charles Richard  KVQ:[id number] DOB: 04-17-1930 DOA: 01/05/2024 PCP: [facility]  Brief Narrative/Hospital Course: 77 yom w/ history of AAA, osteoarthritis, atrial fibrillation, hx of COVID-19, type 2 diabetes, hypertension, impaired hearing, history of CVA, hyperlipidemia, history of sepsis, bilateral heel ulcers, left-sided nephrolithiasis with hydronephrosis, UTI and urosepsis requiring left ureteral stent placement who was brought to the ED due to altered mental status, cough and a 7-day history of flank pain that has gotten significantly worse over the last 2 days. In the ED: BP soft in 90s, workup showed leukocytosis significant AKI hyperkalemia creat 6.6 b/l 1.5-2.0, CT abdomen showed obstruction of kidney with a stone on the left with a left-sided hydronephrosis.  Urology consulted IV antibiotic IV fluids started and admitted Patient underwent emergent cystoscopy with left JJ stent placement and was admitted to ICU/stepdown.     Subjective: Feels well this am HOH Feeling like having bm Overnight hemodynamic-BP soft 70s to 90s-this morning 110-1 50s afebrile, blood sugar 207 potassium 5.5 lactic acid less than 0.3 Improving WBC count 10.8, creatinine at 6.1  Assessment and Plan: Principal Problem:   Acute kidney injury superimposed on stage 3b chronic kidney disease (HCC) Active Problems:   Ureteral stone with hydronephrosis   Essential hypertension   Type 2 diabetes mellitus (HCC)   Hyponatremia   Physical deconditioning   Permanent atrial fibrillation (HCC)   Hyperkalemia   Protein-calorie malnutrition, severe (HCC)   Coronary arteriosclerosis   Hyperlipidemia   Class 1 obesity   Hydroureter on left   Sepsis secondary to UTI (HCC)   Sepsis POA due to UTI with ureteral calculus: Left-sided hydronephrosis: t underwent emergent cystoscopy with left JJ stent placemen 1/25.  BP soft overnight but improving.  WBC count downtrending, has normal  lactate.  Continue Ringer's lactate.  Continue Rocephin.  Follow-up blood culture, hemodynamics.  AKI on CKD stage IIIb-obstructive uropathy Hyperkalemia Metabolic acidosis: Patient with obstructive uropathy sepsis resulting in AKI hyperkalemia metabolic acidosis.  Appreciate nephrology input on board monitor intake output, avoid nephrotoxic medication continue IV fluids, Lokelma. Recent Labs    01/05/24 0817 01/05/24 1108 01/05/24 1118 01/05/24 1243 01/05/24 1321 01/05/24 1659 01/05/24 2053 01/06/24 0324 01/06/24 0454  BUN 76* 78* 83*  --  79*  --   --  76*  --   CREATININE 6.63* 6.68* 7.30*  --  6.90*  --   --  6.12*  --   CO2 11* 16*  --   --   --   --   --  16*  --   K 6.9* 5.9* 5.9*   < > 5.2* 5.7* 5.8* 5.5* 5.5*   < > = values in this interval not displayed.    Hyponatremia: Due to sepsis and dehydration.  Monitor  Essential hypertension Coronary arteriosclerosis Hyperlipidemia: BP stable.  No chest pain.  Continue statin.  Holding Coreg due to sepsis.  Permanent atrial fibrillation: Resume home DOAC once okay with Urology- msg sent this am  Severe protein calorie malnutrition: Augment diet.  T2DM: Holding sitagliptin due to UTI follow-up A1c continue SSI. Recent Labs  Lab 01/05/24 2053 01/05/24 2055 01/06/24 0000 01/06/24 0636 01/06/24 0716 01/06/24 0800  GLUCAP  --  216* 230* 106* 207* 128*  HGBA1C 6.5*  --   --   --   --   --     Physical deconditioning: Obtain PT OT evaluation as able  Mild confusion at home at baseline Acute metabolic encephalopathy w/ agitation overnight 1/25 Hard of  hearing Chronic Insomnia:: Needed haldol for agitation 2/2 sepsis,delerium .Resume ambien, cont prn haldol  Obesity: Patient's Body mass index is 33.25 kg/m. : Will benefit with PCP follow-up, weight loss  healthy lifestyle and outpatient sleep evaluation.   DVT prophylaxis: SCD's Start: 01/05/24 1346 Place and maintain sequential compression device Start:  01/05/24 1145 Code Status:   Code Status: Do not attempt resuscitation (DNR) PRE-ARREST INTERVENTIONS DESIRED Family Communication: plan of care discussed with patient/ daughter at bedside. Patient status is: Remains hospitalized because of severity of illness Level of care: Stepdown   Dispo: The patient is from: home with daughter- uses walker at baseline            Anticipated disposition: TBD Objective: Vitals last 24 hrs: Vitals:   01/06/24 0616 01/06/24 0630 01/06/24 0700 01/06/24 0802  BP: 115/60 110/63 (!) 152/133 (!) 105/50  Pulse: 87 78 78 89  Resp: 14 (!) 9 15 (!) 22  Temp:    (!) 97.5 F (36.4 C)  TempSrc:    Oral  SpO2: 98% 100% 100% 100%  Weight:      Height:       Weight change:   Physical Examination: General exam: alert awake, hard of hearing HEENT:Oral mucosa moist, Ear/Nose WNL grossly Respiratory system: Bilaterally clear BS,no use of accessory muscle Cardiovascular system: S1 & S2 +, No JVD. Gastrointestinal system: Abdomen soft,NT,ND, BS+ Nervous System: Alert, awake, moving all extremities,and following commands. Extremities: LE edema neg,distal peripheral pulses palpable and warm.  Skin: No rashes,no icterus. MSK: Normal muscle bulk,tone, power  Foley+ clear  Medications reviewed:  Scheduled Meds:  Chlorhexidine Gluconate Cloth  6 each Topical Daily   docusate sodium  100 mg Oral Daily   gabapentin  300 mg Oral QHS   latanoprost  1 drop Both Eyes QHS   loratadine  10 mg Oral Q breakfast   meclizine  25 mg Oral Daily   polyvinyl alcohol  1 drop Both Eyes Daily   senna-docusate  1 tablet Oral Daily   simvastatin  10 mg Oral Daily   sodium zirconium cyclosilicate  10 g Oral BID   Continuous Infusions:  cefTRIAXone (ROCEPHIN)  IV     lactated ringers 75 mL/hr at 01/06/24 0745      Diet Order             Diet renal/carb modified with fluid restriction Diet-HS Snack? Nothing; Fluid restriction: 1200 mL Fluid; Room service appropriate? Yes;  Fluid consistency: Thin  Diet effective now                  Intake/Output Summary (Last 24 hours) at 01/06/2024 0902 Last data filed at 01/06/2024 0745 Gross per 24 hour  Intake 2658.16 ml  Output 1652 ml  Net 1006.16 ml   Net IO Since Admission: 1,006.16 mL [01/06/24 0902]  Wt Readings from Last 3 Encounters:  01/06/24 111.2 kg  08/01/21 103.9 kg  05/05/20 97.5 kg     Unresulted Labs (From admission, onward)     Start     Ordered   01/07/24 0500  CBC  Daily,   R     Question:  Specimen collection method  Answer:  Lab=Lab collect   01/06/24 0741   01/06/24 0500  Renal function panel  Daily,   R      01/05/24 1252   Signed and Held  CBC  Tomorrow morning,   R        Signed and Held  Data Reviewed: I have personally reviewed following labs and imaging studies CBC: Recent Labs  Lab 01/05/24 0817 01/05/24 0834 01/05/24 1118 01/05/24 1321 01/06/24 0454  WBC 5.6 13.6*  --   --  10.8*  NEUTROABS 4.2  --   --   --   --   HGB 3.7* 10.4* 9.5* 9.5* 9.7*  HCT 13.1* 32.8* 28.0* 28.0* 30.2*  MCV 112.0* 101.2*  --   --  100.3*  PLT 84* 219  --   --  200   Basic Metabolic Panel:  Recent Labs  Lab 01/05/24 0817 01/05/24 1108 01/05/24 1118 01/05/24 1243 01/05/24 1321 01/05/24 1659 01/05/24 2053 01/06/24 0324 01/06/24 0454  NA 130* 133* 132*  --  133*  --   --  133*  --   K 6.9* 5.9* 5.9* 5.2* 5.2* 5.7* 5.8* 5.5* 5.5*  CL 106 106 107  --  107  --   --  107  --   CO2 11* 16*  --   --   --   --   --  16*  --   GLUCOSE 91 111* 105* 131* 125*  --   --  150*  --   BUN 76* 78* 83*  --  79*  --   --  76*  --   CREATININE 6.63* 6.68* 7.30*  --  6.90*  --   --  6.12*  --   CALCIUM 7.7* 8.0*  --   --   --   --   --  8.3*  --   PHOS  --   --   --   --   --   --   --  6.4*  --    GFR: Estimated Creatinine Clearance: 10.1 mL/min (A) (by C-G formula based on SCr of 6.12 mg/dL (H)). Liver Function Tests:  Recent Labs  Lab 01/05/24 0817 01/06/24 0324  AST 26  --    ALT 14  --   ALKPHOS 46  --   BILITOT 1.3*  --   PROT 5.8*  --   ALBUMIN 2.4* 2.1*   Recent Labs  Lab 01/05/24 0850  LIPASE 29   No results for input(s): "AMMONIA" in the last 168 hours. Coagulation Profile:  Recent Labs  Lab 01/05/24 0834 01/06/24 0454  INR 1.7* 1.6*   No results for input(s): "PROBNP" in the last 168 hours.  Recent Labs    01/05/24 2053  HGBA1C 6.5*   Recent Labs  Lab 01/05/24 2055 01/06/24 0000 01/06/24 0636 01/06/24 0716 01/06/24 0800  GLUCAP 216* 230* 106* 207* 128*   Recent Labs  Lab 01/05/24 0857 01/05/24 1118 01/06/24 0454  LATICACIDVEN 0.9 0.6 <0.3*   Recent Results (from the past 240 hours)  Resp panel by RT-PCR (RSV, Flu A&B, Covid) Anterior Nasal Swab     Status: None   Collection Time: 01/05/24  8:17 AM   Specimen: Anterior Nasal Swab  Result Value Ref Range Status   SARS Coronavirus 2 by RT PCR NEGATIVE NEGATIVE Final    Comment: (NOTE) SARS-CoV-2 target nucleic acids are NOT DETECTED.  The SARS-CoV-2 RNA is generally detectable in upper respiratory specimens during the acute phase of infection. The lowest concentration of SARS-CoV-2 viral copies this assay can detect is 138 copies/mL. A negative result does not preclude SARS-Cov-2 infection and should not be used as the sole basis for treatment or other patient management decisions. A negative result may occur with  improper specimen collection/handling, submission of specimen other than  nasopharyngeal swab, presence of viral mutation(s) within the areas targeted by this assay, and inadequate number of viral copies(<138 copies/mL). A negative result must be combined with clinical observations, patient history, and epidemiological information. The expected result is Negative.  Fact Sheet for Patients:  BloggerCourse.com  Fact Sheet for Healthcare Providers:  SeriousBroker.it  This test is no t yet approved or cleared  by the [place] FDA and  has been authorized for detection and/or diagnosis of SARS-CoV-2 by FDA under an Emergency Use Authorization (EUA). This EUA will remain  in effect (meaning this test can be used) for the duration of the COVID-19 declaration under Section 564(b)(1) of the Act, 21 U.S.C.section 360bbb-3(b)(1), unless the authorization is terminated  or revoked sooner.       Influenza A by PCR NEGATIVE NEGATIVE Final   Influenza B by PCR NEGATIVE NEGATIVE Final    Comment: (NOTE) The Xpert Xpress SARS-CoV-2/FLU/RSV plus assay is intended as an aid in the diagnosis of influenza from Nasopharyngeal swab specimens and should not be used as a sole basis for treatment. Nasal washings and aspirates are unacceptable for Xpert Xpress SARS-CoV-2/FLU/RSV testing.  Fact Sheet for Patients: BloggerCourse.com  Fact Sheet for Healthcare Providers: SeriousBroker.it  This test is not yet approved or cleared by the [place] FDA and has been authorized for detection and/or diagnosis of SARS-CoV-2 by FDA under an Emergency Use Authorization (EUA). This EUA will remain in effect (meaning this test can be used) for the duration of the COVID-19 declaration under Section 564(b)(1) of the Act, 21 U.S.C. section 360bbb-3(b)(1), unless the authorization is terminated or revoked.     Resp Syncytial Virus by PCR NEGATIVE NEGATIVE Final    Comment: (NOTE) Fact Sheet for Patients: BloggerCourse.com  Fact Sheet for Healthcare Providers: SeriousBroker.it  This test is not yet approved or cleared by the [place] FDA and has been authorized for detection and/or diagnosis of SARS-CoV-2 by FDA under an Emergency Use Authorization (EUA). This EUA will remain in effect (meaning this test can be used) for the duration of the COVID-19 declaration under Section 564(b)(1) of the Act, 21  U.S.C. section 360bbb-3(b)(1), unless the authorization is terminated or revoked.  Performed at [facility], [address], [place], [place] [address]   Blood Culture (routine x 2)     Status: None (Preliminary result)   Collection Time: 01/05/24  8:34 AM   Specimen: BLOOD RIGHT ARM  Result Value Ref Range Status   Specimen Description   Final    BLOOD RIGHT ARM Performed at [facility], [address] [place], [place] [address]    Special Requests   Final    BOTTLES DRAWN AEROBIC AND ANAEROBIC Blood Culture results may not be optimal due to an inadequate volume of blood received in culture bottles Performed at [facility], [address], [place], [place] [address]    Culture PENDING  Incomplete   Report Status PENDING  Incomplete  Blood Culture (routine x 2)     Status: None (Preliminary result)   Collection Time: 01/05/24  9:00 AM   Specimen: BLOOD LEFT ARM  Result Value Ref Range Status   Specimen Description   Final    BLOOD LEFT ARM Performed at [facility], [address] [place], [place] [address]    Special Requests   Final    BOTTLES DRAWN AEROBIC AND ANAEROBIC Blood Culture results may not be optimal due to an inadequate volume of  blood received in culture bottles Performed at [facility], [address] [place], [place] [address]    Culture PENDING  Incomplete   Report Status PENDING  Incomplete    Antimicrobials/Microbiology: Anti-infectives (From admission, onward)    Start     Dose/Rate Route Frequency Ordered Stop   01/06/24 0800  cefTRIAXone (ROCEPHIN) 2 g in sodium chloride 0.9 % 100 mL IVPB        2 g 200 mL/hr over 30 Minutes Intravenous Every 24 hours 01/05/24 1826     01/05/24 1400  ceFAZolin (ANCEF) IVPB 2g/100 mL premix        2 g 200 mL/hr over 30 Minutes Intravenous  Once 01/05/24 1345 01/05/24 1809   01/05/24 0845  cefTRIAXone (ROCEPHIN) 2 g in sodium chloride 0.9 %  100 mL IVPB        2 g 200 mL/hr over 30 Minutes Intravenous Once 01/05/24 0834 01/05/24 1640         Component Value Date/Time   SDES  01/05/2024 0900    BLOOD LEFT ARM Performed at [facility], [address] [place], [place] [address]    SPECREQUEST  01/05/2024 0900    BOTTLES DRAWN AEROBIC AND ANAEROBIC Blood Culture results may not be optimal due to an inadequate volume of blood received in culture bottles Performed at [facility], [address] [place], [place] [address]    CULT PENDING 01/05/2024 0900   REPTSTATUS PENDING 01/05/2024 0900     Radiology Studies: DG C-Arm 1-60 Min-No Report Result Date: 01/05/2024 Fluoroscopy was utilized by the requesting physician.  No radiographic interpretation.   CT Renal Stone Study Result Date: 01/05/2024 CLINICAL DATA:  Abdominal and flank pain.  Nephrolithiasis. EXAM: CT ABDOMEN AND PELVIS WITHOUT CONTRAST TECHNIQUE: Multidetector CT imaging of the abdomen and pelvis was performed following the standard protocol without IV contrast. RADIATION DOSE REDUCTION: This exam was performed according to the departmental dose-optimization program which includes automated exposure control, adjustment of the mA and/or kV according to patient size and/or use of iterative reconstruction technique. COMPARISON:  07/30/2021 FINDINGS: Lower chest: No acute findings. Hepatobiliary:  No mass visualized on this unenhanced exam. Pancreas: No mass or inflammatory process visualized on this unenhanced exam. Spleen:  Within normal limits in size. Adrenals/Urinary tract: Stable right renal benign Bosniak category 1 and 2 renal cysts again seen. Several renal calculi are seen bilaterally largest measuring 1 cm. Moderate left hydroureteronephrosis is seen due to a 6 mm calculus in the mid left ureter near the iliac vessels. A 2 mm calculus is also seen in the proximal left ureter and 2 small calculi are also seen in the urinary bladder.  Stomach/Bowel: No evidence of obstruction, inflammatory process, or abnormal fluid collections. Vascular/Lymphatic: No evidence of abdominal aortic aneurysm. 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 No acute vascular calcification or lymphadenopathy identified. Reproductive:  No mass or other significant abnormality. Other:   Small fat-containing midline ventral hernia is stable. Musculoskeletal: No suspicious bone lesions identified. Mild T11 vertebral body fracture is seen which appears acute or subacute in age. IMPRESSION: Moderate left hydroureteronephrosis due to 6 mm calculus in mid left ureter. Additional 2 mm calculus in proximal left ureter, and 2 small calculi in urinary bladder. Bilateral nephrolithiasis. Acute versus subacute compression fracture of the T11 vertebral body. 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 Stable small fat-containing midline ventral hernia. Electronically Signed   By: Danae Orleans M.D.   On: 01/05/2024 10:55   DG Chest Portable 1 View Result Date: 01/05/2024 CLINICAL DATA:  Cough. EXAM: PORTABLE CHEST 1 VIEW COMPARISON:  None Available. FINDINGS: The heart size and mediastinal contours are within normal limits. Both lungs are clear. Right shoulder prosthesis noted. IMPRESSION: No active disease. Electronically Signed   By: Danae Orleans M.D.   On: 01/05/2024 09:16     LOS: 1 day   Total time spent in review of labs and imaging, patient evaluation, formulation of plan, documentation and communication with family: 50  minutes  Lanae Boast, MD  [facility]  01/06/2024, 9:02 AM

## 2024-01-06 NOTE — Evaluation (Addendum)
Physical Therapy Evaluation Patient Details Name: Charles Richard MRN: [id number] DOB: [birth year] Today's Date: 01/06/2024  History of Present Illness  Pt admitted from home with AMS and flank pain - dx with AKI superimposed on CKD Stage 3b, sepsis 2* UTI, hyperkalemia, hyponatremia, and ureteral stone s/p cystoscopy with stent placement (01/05/24).  PT with hx of AAA, Afib, DM, CVA, MI, R reverse TSR, bil heel ulcers, CKD and very poor hearing  Clinical Impression  Pt admitted as above and presenting with functional mobility limitations 2* generalized weakness, decreased activity tolerance, and balance deficits.  Pt hopes to progress to dc home with family assist and would benefit from follow up HHPT to maximize IND and safety.        If plan is discharge home, recommend the following: A lot of help with walking and/or transfers;A little help with bathing/dressing/bathroom;Assistance with cooking/housework;Assist for transportation;Help with stairs or ramp for entrance   Can travel by private vehicle        Equipment Recommendations None recommended by PT  Recommendations for Other Services       Functional Status Assessment Patient has had a recent decline in their functional status and demonstrates the ability to make significant improvements in function in a reasonable and predictable amount of time.     Precautions / Restrictions Precautions Precautions: Fall Restrictions Weight Bearing Restrictions Per Provider Order: No      Mobility  Bed Mobility Overal bed mobility: Needs Assistance Bed Mobility: Supine to Sit     Supine to sit: Min assist, HOB elevated, Used rails     General bed mobility comments: INcreased time    Transfers Overall transfer level: Needs assistance Equipment used: Rolling walker (2 wheels) Transfers: Sit to/from Stand Sit to Stand: Mod assist, From elevated surface, +2 physical assistance, +2 safety/equipment           General  transfer comment: cues for LE management and use of UEs to self assist.  Physical assist to bring wt up and fwd and to balance in initial standing with RW    Ambulation/Gait Ambulation/Gait assistance: +2 safety/equipment, +2 physical assistance, Min assist, Mod assist Gait Distance (Feet): 26 Feet Assistive device: Rolling walker (2 wheels) Gait Pattern/deviations: Step-to pattern, Decreased step length - right, Decreased step length - left, Shuffle, Trunk flexed Gait velocity: decr     General Gait Details: cues for posture and position from RW;  distance ltd by fatigue  Stairs            Wheelchair Mobility     Tilt Bed    Modified Rankin (Stroke Patients Only)       Balance Overall balance assessment: Needs assistance Sitting-balance support: Feet supported, No upper extremity supported Sitting balance-Leahy Scale: Fair     Standing balance support: Bilateral upper extremity supported Standing balance-Leahy Scale: Poor                               Pertinent Vitals/Pain Pain Assessment Pain Assessment: No/denies pain    Home Living Family/patient expects to be discharged to:: Private residence Living Arrangements: Children Available Help at Discharge: Family;Available 24 hours/day Type of Home: House Home Access: Ramped entrance       Home Layout: One level Home Equipment: Rollator (4 wheels);BSC/3in1;Transport chair;Rolling Walker (2 wheels);Cane - single point;Shower seat;Hand held shower head;Grab bars - toilet;Grab bars - tub/shower;Hospital bed;Lift chair      Prior Function Prior Level of  Function : Needs assist             Mobility Comments: using ROllator ADLs Comments: assist as needed by family     Extremity/Trunk Assessment   Upper Extremity Assessment Upper Extremity Assessment: Generalized weakness    Lower Extremity Assessment Lower Extremity Assessment: Generalized weakness       Communication    Communication Communication: Hearing impairment  Cognition Arousal: Alert Behavior During Therapy: WFL for tasks assessed/performed, Impulsive Overall Cognitive Status: Difficult to assess                                          General Comments      Exercises     Assessment/Plan    PT Assessment Patient needs continued PT services  PT Problem List Decreased strength;Decreased range of motion;Decreased activity tolerance;Decreased balance;Decreased mobility;Decreased knowledge of use of DME;Pain;Decreased safety awareness       PT Treatment Interventions DME instruction;Gait training;Stair training;Therapeutic exercise;Therapeutic activities;Functional mobility training;Balance training;Patient/family education    PT Goals (Current goals can be found in the Care Plan section)  Acute Rehab PT Goals Patient Stated Goal: Regain IND PT Goal Formulation: With patient Time For Goal Achievement: 01/20/24 Potential to Achieve Goals: Fair    Frequency Min 1X/week     Co-evaluation               AM-PAC PT "6 Clicks" Mobility  Outcome Measure Help needed turning from your back to your side while in a flat bed without using bedrails?: A Little Help needed moving from lying on your back to sitting on the side of a flat bed without using bedrails?: A Little Help needed moving to and from a bed to a chair (including a wheelchair)?: A Lot Help needed standing up from a chair using your arms (e.g., wheelchair or bedside chair)?: A Lot Help needed to walk in hospital room?: A Lot Help needed climbing 3-5 steps with a railing? : A Lot 6 Click Score: 14    End of Session Equipment Utilized During Treatment: Gait belt;Oxygen Activity Tolerance: Patient tolerated treatment well;Patient limited by fatigue Patient left: in chair;with call bell/phone within reach;with chair alarm set;with family/visitor present Nurse Communication: Mobility status PT Visit Diagnosis:  Unsteadiness on feet (R26.81);Muscle weakness (generalized) (M62.81);Difficulty in walking, not elsewhere classified (R26.2)    Time: 1451-1520 PT Time Calculation (min) (ACUTE ONLY): 29 min   Charges:   PT Evaluation $PT Eval Moderate Complexity: 1 Mod  PT Treatments $Gait Training: 8-22 mins PT General Charges $$ ACUTE PT VISIT: 1 Visit         Mauro Kaufmann PT Acute Rehabilitation Services Pager [phone] Office [phone]   Citlally Captain 01/06/2024, 3:57 PM

## 2024-01-07 DIAGNOSIS — N1832 Chronic kidney disease, stage 3b: Secondary | ICD-10-CM | POA: Diagnosis not present

## 2024-01-07 DIAGNOSIS — N179 Acute kidney failure, unspecified: Secondary | ICD-10-CM | POA: Diagnosis not present

## 2024-01-07 LAB — GLUCOSE, CAPILLARY
Glucose-Capillary: 136 mg/dL — ABNORMAL HIGH (ref 70–99)
Glucose-Capillary: 134 mg/dL — ABNORMAL HIGH (ref 70–99)
Glucose-Capillary: 122 mg/dL — ABNORMAL HIGH (ref 70–99)
Glucose-Capillary: 120 mg/dL — ABNORMAL HIGH (ref 70–99)

## 2024-01-07 LAB — RENAL FUNCTION PANEL
Albumin: 2.3 g/dL — ABNORMAL LOW (ref 3.5–5.0)
Anion gap: 13 (ref 5–15)
BUN: 81 mg/dL — ABNORMAL HIGH (ref 8–23)
CO2: 16 mmol/L — ABNORMAL LOW (ref 22–32)
Calcium: 8.4 mg/dL — ABNORMAL LOW (ref 8.9–10.3)
Chloride: 107 mmol/L (ref 98–111)
Creatinine, Ser: 5.72 mg/dL — ABNORMAL HIGH (ref 0.61–1.24)
GFR, Estimated: 9 mL/min — ABNORMAL LOW (ref >60)
Glucose, Bld: 117 mg/dL — ABNORMAL HIGH (ref 70–99)
Phosphorus: 6.3 mg/dL — ABNORMAL HIGH (ref 2.5–4.6)
Potassium: 4.6 mmol/L (ref 3.5–5.1)
Sodium: 136 mmol/L (ref 135–145)

## 2024-01-07 LAB — CBC
HCT: 31.7 % — ABNORMAL LOW (ref 39.0–52.0)
Hemoglobin: 10.2 g/dL — ABNORMAL LOW (ref 13.0–17.0)
MCH: 31.6 pg (ref 26.0–34.0)
MCHC: 32.2 g/dL (ref 30.0–36.0)
MCV: 98.1 fL (ref 80.0–100.0)
Platelets: 253 10*3/uL (ref 150–400)
RBC: 3.23 MIL/uL — ABNORMAL LOW (ref 4.22–5.81)
RDW: 12.5 % (ref 11.5–15.5)
WBC: 12.7 10*3/uL — ABNORMAL HIGH (ref 4.0–10.5)
nRBC: 0 % (ref 0.0–0.2)

## 2024-01-07 LAB — BASIC METABOLIC PANEL
Anion gap: 14 (ref 5–15)
BUN: 79 mg/dL — ABNORMAL HIGH (ref 8–23)
CO2: 16 mmol/L — ABNORMAL LOW (ref 22–32)
Calcium: 8.4 mg/dL — ABNORMAL LOW (ref 8.9–10.3)
Chloride: 107 mmol/L (ref 98–111)
Creatinine, Ser: 5.82 mg/dL — ABNORMAL HIGH (ref 0.61–1.24)
GFR, Estimated: 9 mL/min — ABNORMAL LOW (ref >60)
Glucose, Bld: 117 mg/dL — ABNORMAL HIGH (ref 70–99)
Potassium: 4.6 mmol/L (ref 3.5–5.1)
Sodium: 137 mmol/L (ref 135–145)

## 2024-01-07 MED ORDER — CARVEDILOL 3.125 MG PO TABS
3.1250 mg | ORAL_TABLET | Freq: Two times a day (BID) | ORAL | Status: DC
  Administered 2024-01-07 – 2024-01-08 (×3): 3.125 mg via ORAL
  Filled 2024-01-07 (×3): qty 1

## 2024-01-07 MED ORDER — HALOPERIDOL LACTATE 5 MG/ML IJ SOLN
1.0000 mg | Freq: Once | INTRAMUSCULAR | Status: AC | PRN
  Administered 2024-01-07: 1 mg via INTRAVENOUS
  Filled 2024-01-07: qty 1

## 2024-01-07 MED ORDER — ORAL CARE MOUTH RINSE: 15.0000 mL | OROMUCOSAL | Status: DC | PRN

## 2024-01-07 NOTE — Progress Notes (Signed)
PROGRESS NOTE Charles Richard  ZOX:[id number] DOB: 01/22/1930 DOA: 01/05/2024 PCP: [facility]  Brief Narrative/Hospital Course: 50 yom w/ history of AAA, osteoarthritis, atrial fibrillation, hx of COVID-19, type 2 diabetes, hypertension, impaired hearing, history of CVA, hyperlipidemia, history of sepsis, bilateral heel ulcers, left-sided nephrolithiasis with hydronephrosis, UTI and urosepsis requiring left ureteral stent placement who was brought to the ED due to altered mental status, cough and a 7-day history of flank pain that has gotten significantly worse over the last 2 days. In the ED: BP soft in 90s, workup showed leukocytosis significant AKI hyperkalemia creat 6.6 b/l 1.5-2.0, CT abdomen showed obstruction of kidney with a stone on the left with a left-sided hydronephrosis.  Urology consulted IV antibiotic IV fluids started and admitted Patient underwent emergent cystoscopy with left JJ stent placement and was admitted to ICU/stepdown.   Subjective: Doing well. Up on bedside chair with PT HR went in to 140s with minimal movement. BP stable Overnight tachycardia to 125, saturating 100% afebrile Labs with creatinine slightly down 5.7 BUN 81 CBC with mild leukocytosis HOH UOP 2.7 l  Assessment and Plan: Principal Problem:   Acute kidney injury superimposed on stage 3b chronic kidney disease (HCC) Active Problems:   Ureteral stone with hydronephrosis   Essential hypertension   Type 2 diabetes mellitus (HCC)   Hyponatremia   Physical deconditioning   Permanent atrial fibrillation (HCC)   Hyperkalemia   Protein-calorie malnutrition, severe (HCC)   Coronary arteriosclerosis   Hyperlipidemia   Class 1 obesity   Hydroureter on left   Sepsis secondary to UTI (HCC)   Sepsis POA due to UTI with ureteral calculus: Left-sided hydronephrosis: S/p emergent cystoscopy with left ureteral stent placement 1/25.  Blood cultures NGTD.  Urology following having good urine output,  plan for left ureteroscopic stone extraction in 2 to 3 weeks Recent Labs  Lab 01/05/24 0817 01/05/24 0834 01/05/24 0857 01/05/24 1118 01/06/24 0454 01/07/24 0241  WBC 5.6 13.6*  --   --  10.8* 12.7*  LATICACIDVEN  --   --  0.9 0.6 <0.3*  --       AKI on CKD 3b Obstructive uropathy Hyperkalemia Metabolic acidosis: Patient with obstructive uropathy sepsis resulting in AKI hyperkalemia metabolic acidosis-underwent stent placement 1/25 baseline creatinine in mid twos, UA is negative, having good urine output postprocedure, appreciate nephrology follow-up continue IV fluid hydration, hyperkalemia improved still with some metabolic acidosis, Cont lokelam, trend monitor UOP Recent Labs    01/05/24 0817 01/05/24 1108 01/05/24 1118 01/05/24 1243 01/05/24 1321 01/05/24 1659 01/05/24 2053 01/06/24 0324 01/06/24 0454 01/07/24 0241 01/07/24 0242  BUN 76* 78* 83*  --  79*  --   --  76*  --  79* 81*  CREATININE 6.63* 6.68* 7.30*  --  6.90*  --   --  6.12*  --  5.82* 5.72*  CO2 11* 16*  --   --   --   --   --  16*  --  16* 16*  K 6.9* 5.9* 5.9*   < > 5.2*   < > 5.8* 5.5* 5.5* 4.6 4.6   < > = values in this interval not displayed.    Hyponatremia: Due to sepsis and dehydration.  Monitor  Essential hypertension Coronary arteriosclerosis Hyperlipidemia: BP stable. No chest pain.Continue statin.  Resume coreg  Permanent atrial fibrillation: Apixaban resumed 1/26 after discussing with Dr. Ronne Binning. Coreg on hold- resume  3.125mg  bid TODAY- pta ON 6.25  Severe protein calorie malnutrition: Augment diet.  T2DM: Holding sitagliptin due to UTI follow-up A1c stable 6.5 blood sugar control on sliding scale Recent Labs  Lab 01/05/24 2053 01/05/24 2055 01/06/24 0800 01/06/24 1231 01/06/24 1653 01/06/24 2159 01/07/24 0732  GLUCAP  --    < > 128* 135* 138* 126* 136*  HGBA1C 6.5*  --   --   --   --   --   --    < > = values in this interval not displayed.    Physical  deconditioning: Cont PT OT evaluation as able  Mild confusion at home at baseline Acute metabolic encephalopathy w/ agitation overnight 1/25 Hard of hearing Chronic Insomnia:: Needed haldol for agitation last night too Mood better this am. Keep hearing aid on during night is possible Cont chronic ambien, cont prn haldol, delirium precaution PT OT fall precaution.  Obesity: Patient's Body mass index is 34.09 kg/m. : Will benefit with PCP follow-up, weight loss  healthy lifestyle and outpatient sleep evaluation.   DVT prophylaxis: apixaban (ELIQUIS) tablet 2.5 mg Start: 01/06/24 1245 SCD's Start: 01/05/24 1346 Place and maintain sequential compression device Start: 01/05/24 1145 Code Status:   Code Status: Do not attempt resuscitation (DNR) PRE-ARREST INTERVENTIONS DESIRED Family Communication: plan of care discussed with patient/ daughter at bedside updated Patient status is: Remains hospitalized because of severity of illness Level of care: Stepdown   Dispo: The patient is from: home with daughter- uses walker at baseline            Anticipated disposition: TBD Objective: Vitals last 24 hrs: Vitals:   01/07/24 0500 01/07/24 0725 01/07/24 0759 01/07/24 0800  BP: (!) 134/100  137/60 137/60  Pulse: (!) 125  (!) 101 98  Resp: (!) 26  20 19   Temp:  (!) 97.2 F (36.2 C)    TempSrc:  Oral    SpO2: 100%  (!) 89% 96%  Weight: 114 kg     Height:       Weight change: 10.1 kg  Physical Examination: General exam: alert awake, oriented HEENT:Oral mucosa moist, Ear/Nose WNL grossly Respiratory system: Bilaterally clear BS,no use of accessory muscle Cardiovascular system: S1 & S2 +, No JVD. Gastrointestinal system: Abdomen soft,NT,ND, BS+ Nervous System: Alert, awake, moving all extremities,and following commands. Extremities: LE edema neg,distal peripheral pulses palpable and warm.  Skin: No rashes,no icterus. MSK: Normal muscle bulk,tone, power  Foley+ clear urine  Medications  reviewed:  Scheduled Meds:  apixaban  2.5 mg Oral BID   Chlorhexidine Gluconate Cloth  6 each Topical Daily   docusate sodium  100 mg Oral Daily   gabapentin  300 mg Oral QHS   latanoprost  1 drop Both Eyes QHS   loratadine  10 mg Oral Q breakfast   meclizine  25 mg Oral Daily   mirabegron ER  25 mg Oral Daily   polyvinyl alcohol  1 drop Both Eyes Daily   senna-docusate  1 tablet Oral Daily   simvastatin  10 mg Oral Daily   zolpidem  10 mg Oral QHS   Continuous Infusions:  cefTRIAXone (ROCEPHIN)  IV Stopped (01/07/24 1610)   lactated ringers 75 mL/hr at 01/07/24 9604      Diet Order             Diet renal/carb modified with fluid restriction Diet-HS Snack? Nothing; Fluid restriction: 1200 mL Fluid; Room service appropriate? Yes; Fluid consistency: Thin  Diet effective now                  Intake/Output  Summary (Last 24 hours) at 01/07/2024 0859 Last data filed at 01/07/2024 0856 Gross per 24 hour  Intake 3195.82 ml  Output 2650 ml  Net 545.82 ml   Net IO Since Admission: 1,486.98 mL [01/07/24 0859]  Wt Readings from Last 3 Encounters:  01/07/24 114 kg  08/01/21 103.9 kg  05/05/20 97.5 kg     Unresulted Labs (From admission, onward)     Start     Ordered   01/07/24 0500  CBC  Daily,   R     Question:  Specimen collection method  Answer:  Lab=Lab collect   01/06/24 0741   01/06/24 0500  Renal function panel  Daily,   R      01/05/24 1252   Signed and Held  CBC  Tomorrow morning,   R        Signed and Held          Data Reviewed: I have personally reviewed following labs and imaging studies CBC: Recent Labs  Lab 01/05/24 0817 01/05/24 0834 01/05/24 1118 01/05/24 1321 01/06/24 0454 01/07/24 0241  WBC 5.6 13.6*  --   --  10.8* 12.7*  NEUTROABS 4.2  --   --   --   --   --   HGB 3.7* 10.4* 9.5* 9.5* 9.7* 10.2*  HCT 13.1* 32.8* 28.0* 28.0* 30.2* 31.7*  MCV 112.0* 101.2*  --   --  100.3* 98.1  PLT 84* 219  --   --  200 253   Basic Metabolic Panel:   Recent Labs  Lab 01/05/24 0817 01/05/24 1108 01/05/24 1118 01/05/24 1243 01/05/24 1321 01/05/24 1659 01/05/24 2053 01/06/24 0324 01/06/24 0454 01/07/24 0241 01/07/24 0242  NA 130* 133* 132*  --  133*  --   --  133*  --  137 136  K 6.9* 5.9* 5.9* 5.2* 5.2*   < > 5.8* 5.5* 5.5* 4.6 4.6  CL 106 106 107  --  107  --   --  107  --  107 107  CO2 11* 16*  --   --   --   --   --  16*  --  16* 16*  GLUCOSE 91 111* 105* 131* 125*  --   --  150*  --  117* 117*  BUN 76* 78* 83*  --  79*  --   --  76*  --  79* 81*  CREATININE 6.63* 6.68* 7.30*  --  6.90*  --   --  6.12*  --  5.82* 5.72*  CALCIUM 7.7* 8.0*  --   --   --   --   --  8.3*  --  8.4* 8.4*  PHOS  --   --   --   --   --   --   --  6.4*  --   --  6.3*   < > = values in this interval not displayed.   GFR: Estimated Creatinine Clearance: 11 mL/min (A) (by C-G formula based on SCr of 5.72 mg/dL (H)). Liver Function Tests:  Recent Labs  Lab 01/05/24 0817 01/06/24 0324 01/07/24 0242  AST 26  --   --   ALT 14  --   --   ALKPHOS 46  --   --   BILITOT 1.3*  --   --   PROT 5.8*  --   --   ALBUMIN 2.4* 2.1* 2.3*   Recent Labs  Lab 01/05/24 0850  LIPASE 29   No results for input(s): "  AMMONIA" in the last 168 hours. Coagulation Profile:  Recent Labs  Lab 01/05/24 0834 01/06/24 0454  INR 1.7* 1.6*   No results for input(s): "PROBNP" in the last 168 hours.  Recent Labs    01/05/24 2053  HGBA1C 6.5*   Recent Labs  Lab 01/06/24 0800 01/06/24 1231 01/06/24 1653 01/06/24 2159 01/07/24 0732  GLUCAP 128* 135* 138* 126* 136*   Recent Labs  Lab 01/05/24 0857 01/05/24 1118 01/06/24 0454  LATICACIDVEN 0.9 0.6 <0.3*   Recent Results (from the past 240 hours)  Resp panel by RT-PCR (RSV, Flu A&B, Covid) Anterior Nasal Swab     Status: None   Collection Time: 01/05/24  8:17 AM   Specimen: Anterior Nasal Swab  Result Value Ref Range Status   SARS Coronavirus 2 by RT PCR NEGATIVE NEGATIVE Final    Comment:  (NOTE) SARS-CoV-2 target nucleic acids are NOT DETECTED.  The SARS-CoV-2 RNA is generally detectable in upper respiratory specimens during the acute phase of infection. The lowest concentration of SARS-CoV-2 viral copies this assay can detect is 138 copies/mL. A negative result does not preclude SARS-Cov-2 infection and should not be used as the sole basis for treatment or other patient management decisions. A negative result may occur with  improper specimen collection/handling, submission of specimen other than nasopharyngeal swab, presence of viral mutation(s) within the areas targeted by this assay, and inadequate number of viral copies(<138 copies/mL). A negative result must be combined with clinical observations, patient history, and epidemiological information. The expected result is Negative.  Fact Sheet for Patients:  BloggerCourse.com  Fact Sheet for Healthcare Providers:  SeriousBroker.it  This test is no t yet approved or cleared by the [place] FDA and  has been authorized for detection and/or diagnosis of SARS-CoV-2 by FDA under an Emergency Use Authorization (EUA). This EUA will remain  in effect (meaning this test can be used) for the duration of the COVID-19 declaration under Section 564(b)(1) of the Act, 21 U.S.C.section 360bbb-3(b)(1), unless the authorization is terminated  or revoked sooner.       Influenza A by PCR NEGATIVE NEGATIVE Final   Influenza B by PCR NEGATIVE NEGATIVE Final    Comment: (NOTE) The Xpert Xpress SARS-CoV-2/FLU/RSV plus assay is intended as an aid in the diagnosis of influenza from Nasopharyngeal swab specimens and should not be used as a sole basis for treatment. Nasal washings and aspirates are unacceptable for Xpert Xpress SARS-CoV-2/FLU/RSV testing.  Fact Sheet for Patients: BloggerCourse.com  Fact Sheet for Healthcare  Providers: SeriousBroker.it  This test is not yet approved or cleared by the [place] FDA and has been authorized for detection and/or diagnosis of SARS-CoV-2 by FDA under an Emergency Use Authorization (EUA). This EUA will remain in effect (meaning this test can be used) for the duration of the COVID-19 declaration under Section 564(b)(1) of the Act, 21 U.S.C. section 360bbb-3(b)(1), unless the authorization is terminated or revoked.     Resp Syncytial Virus by PCR NEGATIVE NEGATIVE Final    Comment: (NOTE) Fact Sheet for Patients: BloggerCourse.com  Fact Sheet for Healthcare Providers: SeriousBroker.it  This test is not yet approved or cleared by the [place] FDA and has been authorized for detection and/or diagnosis of SARS-CoV-2 by FDA under an Emergency Use Authorization (EUA). This EUA will remain in effect (meaning this test can be used) for the duration of the COVID-19 declaration under Section 564(b)(1) of the Act, 21 U.S.C. section 360bbb-3(b)(1), unless the authorization is terminated or revoked.  Performed at [facility], [address], [place], [place] [address]   Blood Culture (routine x 2)     Status: None (Preliminary result)   Collection Time: 01/05/24  8:34 AM   Specimen: BLOOD RIGHT ARM  Result Value Ref Range Status   Specimen Description   Final    BLOOD RIGHT ARM Performed at [facility], [address], [place], [place] [address]    Special Requests   Final    BOTTLES DRAWN AEROBIC AND ANAEROBIC Blood Culture results may not be optimal due to an inadequate volume of blood received in culture bottles Performed at [facility], [address] [place], [place] [address]    Culture   Final    NO GROWTH 2 DAYS Performed at [facility], [address], [place], [place] [address]    Report Status PENDING  Incomplete   Blood Culture (routine x 2)     Status: None (Preliminary result)   Collection Time: 01/05/24  9:00 AM   Specimen: BLOOD LEFT ARM  Result Value Ref Range Status   Specimen Description   Final    BLOOD LEFT ARM Performed at [facility], [address] [place], [place] [address]    Special Requests   Final    BOTTLES DRAWN AEROBIC AND ANAEROBIC Blood Culture results may not be optimal due to an inadequate volume of blood received in culture bottles Performed at [facility], [address], [place], [place] [address]    Culture   Final    NO GROWTH 2 DAYS Performed at [facility], [address] [place], [place] [address]    Report Status PENDING  Incomplete    Antimicrobials/Microbiology: Anti-infectives (From admission, onward)    Start     Dose/Rate Route Frequency Ordered Stop   01/06/24 0800  cefTRIAXone (ROCEPHIN) 2 g in sodium chloride 0.9 % 100 mL IVPB        2 g 200 mL/hr over 30 Minutes Intravenous Every 24 hours 01/05/24 1826     01/05/24 1400  ceFAZolin (ANCEF) IVPB 2g/100 mL premix        2 g 200 mL/hr over 30 Minutes Intravenous  Once 01/05/24 1345 01/05/24 1809   01/05/24 0845  cefTRIAXone (ROCEPHIN) 2 g in sodium chloride 0.9 % 100 mL IVPB        2 g 200 mL/hr over 30 Minutes Intravenous Once 01/05/24 0834 01/05/24 1640         Component Value Date/Time   SDES  01/05/2024 0900    BLOOD LEFT ARM Performed at [facility], [address], [place], [place] [address]    SPECREQUEST  01/05/2024 0900    BOTTLES DRAWN AEROBIC AND ANAEROBIC Blood Culture results may not be optimal due to an inadequate volume of blood received in culture bottles Performed at [facility], [address] [place], [place] [address]    CULT  01/05/2024 0900    NO GROWTH 2 DAYS Performed at [facility], [address], [place], [place] [address]    REPTSTATUS PENDING 01/05/2024 0900     Radiology Studies: DG  C-Arm 1-60 Min-No Report Result Date: 01/05/2024 Fluoroscopy was utilized by the requesting physician.  No radiographic interpretation.   CT Renal Stone Study Result Date: 01/05/2024 CLINICAL DATA:  Abdominal and flank pain.  Nephrolithiasis. EXAM: CT ABDOMEN AND PELVIS WITHOUT CONTRAST TECHNIQUE: Multidetector CT imaging of  the abdomen and pelvis was performed following the standard protocol without IV contrast. RADIATION DOSE REDUCTION: This exam was performed according to the departmental dose-optimization program which includes automated exposure control, adjustment of the mA and/or kV according to patient size and/or use of iterative reconstruction technique. COMPARISON:  07/30/2021 FINDINGS: Lower chest: No acute findings. Hepatobiliary:  No mass visualized on this unenhanced exam. Pancreas: No mass or inflammatory process visualized on this unenhanced exam. Spleen:  Within normal limits in size. Adrenals/Urinary tract: Stable right renal benign Bosniak category 1 and 2 renal cysts again seen. Several renal calculi are seen bilaterally largest measuring 1 cm. Moderate left hydroureteronephrosis is seen due to a 6 mm calculus in the mid left ureter near the iliac vessels. A 2 mm calculus is also seen in the proximal left ureter and 2 small calculi are also seen in the urinary bladder. Stomach/Bowel: No evidence of obstruction, inflammatory process, or abnormal fluid collections. Vascular/Lymphatic: No evidence of abdominal aortic aneurysm. 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 No acute vascular calcification or lymphadenopathy identified. Reproductive:  No mass or other significant abnormality. Other:   Small fat-containing midline ventral hernia is stable. Musculoskeletal: No suspicious bone lesions identified. Mild T11 vertebral body fracture is seen which appears acute or  subacute in age. IMPRESSION: Moderate left hydroureteronephrosis due to 6 mm calculus in mid left ureter. Additional 2 mm calculus in proximal left ureter, and 2 small calculi in urinary bladder. Bilateral nephrolithiasis. Acute versus subacute compression fracture of the T11 vertebral body. 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 Stable small fat-containing midline ventral hernia. Electronically Signed   By: Danae Orleans M.D.   On: 01/05/2024 10:55     LOS: 2 days   Total time spent in review of labs and imaging, patient evaluation, formulation of plan, documentation and communication with family: 50  minutes  Lanae Boast, MD  [facility]  01/07/2024, 8:59 AM

## 2024-01-07 NOTE — Plan of Care (Signed)
  Problem: Clinical Measurements: Goal: Respiratory complications will improve Outcome: Progressing Goal: Cardiovascular complication will be avoided Outcome: Progressing   Problem: Nutrition: Goal: Adequate nutrition will be maintained Outcome: Progressing   Problem: Nutritional: Goal: Maintenance of adequate nutrition will improve Outcome: Progressing   Problem: Education: Goal: Knowledge of General Education information will improve Description: Including pain rating scale, medication(s)/side effects and non-pharmacologic comfort measures Outcome: Not Progressing   Problem: Pain Managment: Goal: General experience of comfort will improve and/or be controlled Outcome: Not Progressing

## 2024-01-07 NOTE — Progress Notes (Signed)
Nephrology Follow-Up Consult note   Assessment/Recommendations: Charles Richard is a/an 89 y.o. male with a past medical history significant for osteoarthritis, atrial fibrillation, DM2, HTN, impaired hearing, CVA, HLD, nephrolithiasis, admitted for obstructive nephrolithiasis.       AKI on CKD 3B: Baseline creatinine around 2.5 per daughter.  Creatinine improved from 6.6 down to 5.7 today.  Left ureteral stent placed on 1/25.  Likely will slowly improve.  Continue to monitor progress -Continue to monitor daily Cr, Dose meds for GFR -Monitor Daily I/Os, Daily weight  -Maintain MAP>65 for optimal renal perfusion.  -Avoid nephrotoxic medications including NSAIDs -Use synthetic opioids (Fentanyl/Dilaudid) if needed -Currently no indication for HD -Continue IV fluids but likely stop in the next 24 hours  Hyperkalemia: Has improved with relief of obstruction  Obstruction: Status post stent placement.  Antibiotics per primary team  Hypertension: Continue home medications  ICU delirium: Management per primary team  Recommendations conveyed to primary service.    Darnell Level [facility] 01/07/2024 12:21 PM  ___________________________________________________________  CC: AKI  Interval History/Subjective: Patient states he feels well with no complaints today.  Creatinine slightly improved.  Daughter notes that the patient has been disoriented overnight   Medications:  Current Facility-Administered Medications  Medication Dose Route Frequency Provider Last Rate Last Admin   acetaminophen (TYLENOL) tablet 650 mg  650 mg Oral Q6H PRN Bobette Mo, MD       Or   acetaminophen (TYLENOL) suppository 650 mg  650 mg Rectal Q6H PRN Bobette Mo, MD       albuterol (PROVENTIL) (2.5 MG/3ML) 0.083% nebulizer solution 2.5 mg  2.5 mg Nebulization Q6H PRN Bobette Mo, MD   2.5 mg at 01/07/24 1113   apixaban (ELIQUIS) tablet 2.5 mg  2.5 mg Oral BID Kc,  Dayna Barker, MD   2.5 mg at 01/07/24 0932   carvedilol (COREG) tablet 3.125 mg  3.125 mg Oral BID WC Kc, Ramesh, MD   3.125 mg at 01/07/24 1610   cefTRIAXone (ROCEPHIN) 2 g in sodium chloride 0.9 % 100 mL IVPB  2 g Intravenous Q24H Bobette Mo, MD   Stopped at 01/07/24 9604   Chlorhexidine Gluconate Cloth 2 % PADS 6 each  6 each Topical Daily Bobette Mo, MD   6 each at 01/07/24 1020   docusate sodium (COLACE) capsule 100 mg  100 mg Oral Daily Bobette Mo, MD   100 mg at 01/07/24 0932   gabapentin (NEURONTIN) capsule 300 mg  300 mg Oral QHS Bobette Mo, MD   300 mg at 01/06/24 2129   lactated ringers infusion   Intravenous Continuous Delano Metz, MD 75 mL/hr at 01/07/24 0856 Infusion Verify at 01/07/24 0856   latanoprost (XALATAN) 0.005 % ophthalmic solution 1 drop  1 drop Both Eyes QHS Bobette Mo, MD   1 drop at 01/06/24 2130   loratadine (CLARITIN) tablet 10 mg  10 mg Oral Q breakfast Bobette Mo, MD   10 mg at 01/07/24 5409   meclizine (ANTIVERT) tablet 25 mg  25 mg Oral Daily Bobette Mo, MD   25 mg at 01/07/24 0933   ondansetron [facility]) tablet 4 mg  4 mg Oral Q6H PRN Bobette Mo, MD       Or   ondansetron [facility]) injection 4 mg  4 mg Intravenous Q6H PRN Bobette Mo, MD       polyvinyl alcohol (LIQUIFILM TEARS) 1.4 % ophthalmic solution 1 drop  1  drop Both Eyes Daily Herby Abraham, RPH   1 drop at 01/07/24 5366   senna-docusate (Senokot-S) tablet 1 tablet  1 tablet Oral Daily Bobette Mo, MD   1 tablet at 01/07/24 0932   simvastatin (ZOCOR) tablet 10 mg  10 mg Oral Daily Bobette Mo, MD   10 mg at 01/07/24 0933   zolpidem (AMBIEN) tablet 10 mg  10 mg Oral Grier Rocher, MD   10 mg at 01/06/24 2129      Review of Systems: 10 systems reviewed and negative except per interval history/subjective  Physical Exam: Vitals:   01/07/24 1116 01/07/24 1212  BP: (!) 145/70 139/70  Pulse:  86  Resp: (!) 21  14  Temp:    SpO2:  99%   Total I/O In: 482.6 [P.O.:120; I.V.:262.6; IV Piggyback:100] Out: -   Intake/Output Summary (Last 24 hours) at 01/07/2024 1221 Last data filed at 01/07/2024 0941 Gross per 24 hour  Intake 2198.19 ml  Output 2395 ml  Net -196.81 ml   Constitutional: Elderly male, no distress ENMT: ears and nose without scars or lesions, MMM CV: normal rate, no edema Respiratory: Lateral chest rise, normal work of breathing Gastrointestinal: soft, non-tender, no palpable masses or hernias Skin: no visible lesions or rashes Psych: Awake and alert, appropriate mood and affect   Test Results I personally reviewed new and old clinical labs and radiology tests Lab Results  Component Value Date   NA 136 01/07/2024   K 4.6 01/07/2024   CL 107 01/07/2024   CO2 16 (L) 01/07/2024   BUN 81 (H) 01/07/2024   CREATININE 5.72 (H) 01/07/2024   CALCIUM 8.4 (L) 01/07/2024   ALBUMIN 2.3 (L) 01/07/2024   PHOS 6.3 (H) 01/07/2024    CBC Recent Labs  Lab 01/05/24 0817 01/05/24 0834 01/05/24 1118 01/05/24 1321 01/06/24 0454 01/07/24 0241  WBC 5.6 13.6*  --   --  10.8* 12.7*  NEUTROABS 4.2  --   --   --   --   --   HGB 3.7* 10.4*   < > 9.5* 9.7* 10.2*  HCT 13.1* 32.8*   < > 28.0* 30.2* 31.7*  MCV 112.0* 101.2*  --   --  100.3* 98.1  PLT 84* 219  --   --  200 253   < > = values in this interval not displayed.

## 2024-01-07 NOTE — Evaluation (Signed)
Occupational Therapy Evaluation Patient Details Name: Charles Richard MRN: [id number] DOB: 1930/11/11 Today's Date: 01/07/2024   History of Present Illness Patient is a 89 year old male who was admitted from home with AMS and flank pain - dx with AKI superimposed on CKD Stage 3b, sepsis 2* UTI, hyperkalemia, hyponatremia, and ureteral stone s/p cystoscopy with stent placement (01/05/24).  PT with hx of AAA, Afib, DM, CVA, MI, R reverse TSR, bil heel ulcers, CKD and very poor hearing   Clinical Impression   Patient is a 89 year old male who was admitted for above. Patient was living at daughters house with family and caregiver present daily to support patient in ADLs. Patient HOH at baseline and daughter reporting patient is stubborn with only one hearing aid in place on R side. Patient was +2 max for transfer to recliner in room with patient with strong posterior leaning with initial standing at EOB pushing back against bed. Patient was noted to have decreased functional activity tolerance, decreased endurance, decreased standing balance, decreased safety awareness, and decreased knowledge of AD/AE impacting participation in ADLs. Per daughter plan at d/c is to transition home with family support and caregiver. Patient would continue to benefit from skilled OT services at this time while admitted and after d/c to address noted deficits in order to improve overall safety and independence in ADLs.       If plan is discharge home, recommend the following: Two people to help with walking and/or transfers;Two people to help with bathing/dressing/bathroom;Assistance with cooking/housework;Direct supervision/assist for medications management;Assist for transportation;Direct supervision/assist for financial management;Help with stairs or ramp for entrance    Functional Status Assessment  Patient has had a recent decline in their functional status and demonstrates the ability to make significant  improvements in function in a reasonable and predictable amount of time.  Equipment Recommendations  None recommended by OT       Precautions / Restrictions Precautions Precautions: Fall Precaution Comments: hears best in R ear with hearing aid Restrictions Weight Bearing Restrictions Per Provider Order: No      Mobility Bed Mobility Overal bed mobility: Needs Assistance Bed Mobility: Supine to Sit     Supine to sit: Min assist, HOB elevated     General bed mobility comments: with increased time and cues to slow down               Balance Overall balance assessment: Needs assistance Sitting-balance support: Feet supported, No upper extremity supported Sitting balance-Leahy Scale: Fair     Standing balance support: Bilateral upper extremity supported Standing balance-Leahy Scale: Poor Standing balance comment: posterior leaning in standing.         ADL either performed or assessed with clinical judgement   ADL Overall ADL's : Needs assistance/impaired Eating/Feeding: Set up;Sitting   Grooming: Sitting;Wash/dry face;Set up;Minimal assistance Grooming Details (indicate cue type and reason): daughter reported she shaves him at home. Upper Body Bathing: Sitting;Minimal assistance   Lower Body Bathing: Total assistance Lower Body Bathing Details (indicate cue type and reason): family does this for patient at home Upper Body Dressing : Moderate assistance;Sitting   Lower Body Dressing: With caregiver independent assisting Lower Body Dressing Details (indicate cue type and reason): family does this for him at home Toilet Transfer: Maximal assistance;+2 for safety/equipment;+2 for physical assistance;Rolling walker (2 wheels);Stand-pivot Statistician Details (indicate cue type and reason): to recliner. first attempt patient with strong posterior leaning pushing on bed. with second attempt with NT present patient noted to  have better standing but still needing +2  and therapsit standing on R side to tell cues. daughter present in room attempting to aid transfer as well. Toileting- Clothing Manipulation and Hygiene: Bed level;Total assistance               Vision Baseline Vision/History: 1 Wears glasses Vision Assessment?: No apparent visual deficits            Pertinent Vitals/Pain Pain Assessment Pain Assessment: No/denies pain     Extremity/Trunk Assessment Upper Extremity Assessment Upper Extremity Assessment: Overall WFL for tasks assessed   Lower Extremity Assessment Lower Extremity Assessment: Defer to PT evaluation   Cervical / Trunk Assessment Cervical / Trunk Assessment: Normal      Cognition Arousal: Alert Behavior During Therapy: Impulsive Overall Cognitive Status: Difficult to assess             General Comments: patient knew day of week. reported year was 2024. patient HOH and daughter present attempting to answer for patient during session. daughter visibly stressed with all tasks patient participated in.     General Comments  patient noted to have HR up to 144 bpm with standing attempt not susstained. O2 was stable during movement            Home Living Family/patient expects to be discharged to:: Private residence Living Arrangements: Children Available Help at Discharge: Family;Available 24 hours/day Type of Home: House Home Access: Ramped entrance     Home Layout: One level     Bathroom Shower/Tub: Producer, television/film/video: Handicapped height Bathroom Accessibility: Yes   Home Equipment: Rollator (4 wheels);BSC/3in1;Transport chair;Rolling Walker (2 wheels);Cane - single point;Shower seat;Hand held shower head;Grab bars - toilet;Grab bars - tub/shower;Hospital bed;Lift chair          Prior Functioning/Environment Prior Level of Function : Needs assist             Mobility Comments: uses rollator at baseline ADLs Comments: patient gest showers with family 3x a week. patient  able to participate in UB bathing tasks and LB bathing has support.        OT Problem List: Impaired balance (sitting and/or standing);Decreased safety awareness;Decreased knowledge of use of DME or AE;Decreased activity tolerance;Decreased knowledge of precautions      OT Treatment/Interventions: Self-care/ADL training;DME and/or AE instruction;Therapeutic activities;Balance training;Therapeutic exercise;Patient/family education    OT Goals(Current goals can be found in the care plan section) Acute Rehab OT Goals Patient Stated Goal: to get back home OT Goal Formulation: With patient/family Time For Goal Achievement: 01/21/24 Potential to Achieve Goals: Fair  OT Frequency: Min 1X/week       AM-PAC OT "6 Clicks" Daily Activity     Outcome Measure Help from another person eating meals?: A Little Help from another person taking care of personal grooming?: A Lot Help from another person toileting, which includes using toliet, bedpan, or urinal?: A Lot Help from another person bathing (including washing, rinsing, drying)?: A Lot Help from another person to put on and taking off regular upper body clothing?: A Lot Help from another person to put on and taking off regular lower body clothing?: A Lot 6 Click Score: 13   End of Session Equipment Utilized During Treatment: Gait belt;Rolling walker (2 wheels) Nurse Communication: Mobility status  Activity Tolerance: Patient tolerated treatment well Patient left: in chair;with call bell/phone within reach;with chair alarm set;with family/visitor present  OT Visit Diagnosis: Unsteadiness on feet (R26.81);Other abnormalities of gait and mobility (R26.89)  Time: 1914-7829 OT Time Calculation (min): 31 min Charges:  OT General Charges $OT Visit: 1 Visit OT Evaluation $OT Eval Moderate Complexity: 1 Mod OT Treatments $Self Care/Home Management : 8-22 mins  Rosalio Loud, MS Acute Rehabilitation Department Office#  [phone]   Selinda Flavin 01/07/2024, 1:10 PM

## 2024-01-07 NOTE — TOC Initial Note (Addendum)
Transition of Care [facility]) - Initial/Assessment Note    Patient Details  Name: Charles Richard MRN: [id number] Date of Birth: Apr 12, 1930  [facility]) CM/SW Contact:    Lanier Clam, RN Phone Number: 01/07/2024, 10:48 AM  Clinical Narrative:  Left vm w/dtr [name] await call back to discuss PT recc-HHPT. Family has no preference -11:10a [facility] rep Marylene Land for HHPT.                Expected Discharge Plan: Home w Home Health Services Barriers to Discharge: Continued Medical Work up   Patient Goals and CMS Choice            Expected Discharge Plan and Services                                              Prior Living Arrangements/Services                       Activities of Daily Living   ADL Screening (condition at time of admission) Independently performs ADLs?: No Does the patient have a NEW difficulty with bathing/dressing/toileting/self-feeding that is expected to last >3 days?: Yes (Initiates electronic notice to provider for possible OT consult) Does the patient have a NEW difficulty with getting in/out of bed, walking, or climbing stairs that is expected to last >3 days?: Yes (Initiates electronic notice to provider for possible PT consult) Does the patient have a NEW difficulty with communication that is expected to last >3 days?: No Is the patient deaf or have difficulty hearing?: Yes Does the patient have difficulty seeing, even when wearing glasses/contacts?: No Does the patient have difficulty concentrating, remembering, or making decisions?: Yes  Permission Sought/Granted                  Emotional Assessment              Admission diagnosis:  Hyperkalemia [E87.5] Kidney stone [N20.0] AKI (acute kidney injury) (HCC) [N17.9] Sepsis, due to unspecified organism, unspecified whether acute organ dysfunction present (HCC) [A41.9] Acute kidney injury superimposed on stage 3b chronic kidney disease (HCC) [N17.9,  N18.32] Patient Active Problem List   Diagnosis Date Noted   Acute kidney injury superimposed on stage 3b chronic kidney disease (HCC) 01/05/2024   Kidney stone 01/05/2024   Hyperkalemia 01/05/2024   Protein-calorie malnutrition, severe (HCC) 01/05/2024   Coronary arteriosclerosis 01/05/2024   Pressure ulcer of left heel, unspecified stage 01/05/2024   Class 1 obesity 01/05/2024   Hydroureter on left 01/05/2024   Acute UTI (urinary tract infection) 01/05/2024   Sepsis secondary to UTI (HCC) 01/05/2024   Primary open angle glaucoma (POAG) of both eyes, mild stage 01/23/2023   Hyperlipidemia 09/27/2022   Diabetic neuropathy (HCC) 09/27/2022   Stage 4 chronic kidney disease (HCC) 09/27/2022   Acute kidney injury superimposed on chronic kidney disease (HCC) 08/01/2021   Wide-complex tachycardia 07/31/2021   Permanent atrial fibrillation (HCC) 07/30/2021   Closed T11 fracture (HCC) 07/30/2021   Dehydration 07/30/2021   Pneumonia due to COVID-19 virus 05/06/2020   COVID-19 virus infection 05/05/2020   Closed fracture of right proximal humerus 07/20/2017   S/P reverse total shoulder arthroplasty, 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   AAA (abdominal aortic aneurysm) without rupture (HCC) 06/03/2017   Preop cardiovascular exam 06/01/2017   Physical deconditioning    Ureteral stone with hydronephrosis 05/09/2017  Essential hypertension 05/09/2017   Type 2 diabetes mellitus (HCC) 05/09/2017   Hyponatremia 05/09/2017   Acute renal failure superimposed on stage 3a chronic kidney disease (HCC) 05/09/2017   Atrial fibrillation with RVR (HCC) 05/09/2017   Sepsis (HCC) 05/09/2017   PCP:  Clinic, [facility] Pharmacy:   [id number] - [place], [place] - [address] AT [facility] [address] [place] [address] Phone: [phone] Fax: [phone]  [facility] [address] [place] - [address] [place]  [address] Phone: [phone] Fax: [phone]     Social Drivers of Health (SDOH) Social History: SDOH Screenings   Food Insecurity: No Food Insecurity (01/05/2024)  Housing: Low Risk  (01/05/2024)  Transportation Needs: No Transportation Needs (01/05/2024)  Utilities: Not At Risk (01/05/2024)  Social Connections: Patient Unable To Answer (01/05/2024)  Tobacco Use: Low Risk  (01/05/2024)   SDOH Interventions:     Readmission Risk Interventions     No data to display

## 2024-01-08 DIAGNOSIS — N179 Acute kidney failure, unspecified: Secondary | ICD-10-CM | POA: Diagnosis not present

## 2024-01-08 DIAGNOSIS — N1832 Chronic kidney disease, stage 3b: Secondary | ICD-10-CM | POA: Diagnosis not present

## 2024-01-08 LAB — GLUCOSE, CAPILLARY
Glucose-Capillary: 97 mg/dL (ref 70–99)
Glucose-Capillary: 124 mg/dL — ABNORMAL HIGH (ref 70–99)
Glucose-Capillary: 124 mg/dL — ABNORMAL HIGH (ref 70–99)
Glucose-Capillary: 130 mg/dL — ABNORMAL HIGH (ref 70–99)

## 2024-01-08 LAB — CBC
HCT: 29.6 % — ABNORMAL LOW (ref 39.0–52.0)
Hemoglobin: 9.4 g/dL — ABNORMAL LOW (ref 13.0–17.0)
MCH: 31.6 pg (ref 26.0–34.0)
MCHC: 31.8 g/dL (ref 30.0–36.0)
MCV: 99.7 fL (ref 80.0–100.0)
Platelets: 255 10*3/uL (ref 150–400)
RBC: 2.97 MIL/uL — ABNORMAL LOW (ref 4.22–5.81)
RDW: 12.6 % (ref 11.5–15.5)
WBC: 9.6 10*3/uL (ref 4.0–10.5)
nRBC: 0 % (ref 0.0–0.2)

## 2024-01-08 LAB — RENAL FUNCTION PANEL
Albumin: 2.2 g/dL — ABNORMAL LOW (ref 3.5–5.0)
Anion gap: 12 (ref 5–15)
BUN: 75 mg/dL — ABNORMAL HIGH (ref 8–23)
CO2: 18 mmol/L — ABNORMAL LOW (ref 22–32)
Calcium: 7.8 mg/dL — ABNORMAL LOW (ref 8.9–10.3)
Chloride: 108 mmol/L (ref 98–111)
Creatinine, Ser: 5.05 mg/dL — ABNORMAL HIGH (ref 0.61–1.24)
GFR, Estimated: 10 mL/min — ABNORMAL LOW (ref >60)
Glucose, Bld: 111 mg/dL — ABNORMAL HIGH (ref 70–99)
Phosphorus: 5.3 mg/dL — ABNORMAL HIGH (ref 2.5–4.6)
Potassium: 4.9 mmol/L (ref 3.5–5.1)
Sodium: 138 mmol/L (ref 135–145)

## 2024-01-08 MED ORDER — LACTATED RINGERS IV SOLN: INTRAVENOUS | Status: AC

## 2024-01-08 MED ORDER — CARVEDILOL 6.25 MG PO TABS
6.2500 mg | ORAL_TABLET | Freq: Two times a day (BID) | ORAL | Status: DC
  Administered 2024-01-08 – 2024-01-09 (×2): 6.25 mg via ORAL
  Filled 2024-01-08 (×2): qty 1

## 2024-01-08 NOTE — Plan of Care (Signed)
  Problem: Education: Goal: Knowledge of General Education information will improve Description: Including pain rating scale, medication(s)/side effects and non-pharmacologic comfort measures Outcome: Progressing   Problem: Health Behavior/Discharge Planning: Goal: Ability to manage health-related needs will improve Outcome: Progressing   Problem: Clinical Measurements: Goal: Will remain free from infection Outcome: Progressing Goal: Diagnostic test results will improve Outcome: Progressing Goal: Respiratory complications will improve Outcome: Progressing Goal: Cardiovascular complication will be avoided Outcome: Progressing   Problem: Activity: Goal: Risk for activity intolerance will decrease Outcome: Progressing   Problem: Nutrition: Goal: Adequate nutrition will be maintained Outcome: Progressing   Problem: Coping: Goal: Level of anxiety will decrease Outcome: Progressing   Problem: Elimination: Goal: Will not experience complications related to bowel motility Outcome: Progressing Goal: Will not experience complications related to urinary retention Outcome: Progressing   Problem: Pain Managment: Goal: General experience of comfort will improve and/or be controlled Outcome: Progressing   Problem: Safety: Goal: Ability to remain free from injury will improve Outcome: Progressing   Problem: Education: Goal: Ability to describe self-care measures that may prevent or decrease complications (Diabetes Survival Skills Education) will improve Outcome: Progressing Goal: Individualized Educational Video(s) Outcome: Progressing   Problem: Coping: Goal: Ability to adjust to condition or change in health will improve Outcome: Progressing   Problem: Fluid Volume: Goal: Ability to maintain a balanced intake and output will improve Outcome: Progressing   Problem: Health Behavior/Discharge Planning: Goal: Ability to identify and utilize available resources and services  will improve Outcome: Progressing Goal: Ability to manage health-related needs will improve Outcome: Progressing   Problem: Metabolic: Goal: Ability to maintain appropriate glucose levels will improve Outcome: Progressing   Problem: Nutritional: Goal: Maintenance of adequate nutrition will improve Outcome: Progressing Goal: Progress toward achieving an optimal weight will improve Outcome: Progressing   Problem: Tissue Perfusion: Goal: Adequacy of tissue perfusion will improve Outcome: Progressing   Problem: Fluid Volume: Goal: Hemodynamic stability will improve Outcome: Progressing   Problem: Clinical Measurements: Goal: Diagnostic test results will improve Outcome: Progressing Goal: Signs and symptoms of infection will decrease Outcome: Progressing   Problem: Respiratory: Goal: Ability to maintain adequate ventilation will improve Outcome: Progressing

## 2024-01-08 NOTE — Progress Notes (Signed)
Physical Therapy Treatment Patient Details Name: Charles Richard MRN: [id number] DOB: 01/25/1930 Today's Date: 01/08/2024   History of Present Illness Patient is a 89 year old male who was admitted from home with AMS and flank pain - dx with AKI superimposed on CKD Stage 3b, sepsis 2* UTI, hyperkalemia, hyponatremia, and ureteral stone s/p cystoscopy with stent placement (01/05/24).  PT with hx of AAA, Afib, DM, CVA, MI, R reverse TSR, bil heel ulcers, CKD and very poor hearing    PT Comments  Pt tolerated increased ambulation distance of 45' with RW, HR 137 with walking, SpO2 94% on room air, distance limited by fatigue. He is progressing well with mobility.     If plan is discharge home, recommend the following: A lot of help with walking and/or transfers;A little help with bathing/dressing/bathroom;Assistance with cooking/housework;Assist for transportation;Help with stairs or ramp for entrance   Can travel by private vehicle        Equipment Recommendations  None recommended by PT    Recommendations for Other Services       Precautions / Restrictions Precautions Precautions: Fall Precaution Comments: hears best in R ear with hearing aid Restrictions Weight Bearing Restrictions Per Provider Order: No     Mobility  Bed Mobility               General bed mobility comments: up in recliner    Transfers Overall transfer level: Needs assistance Equipment used: Rolling walker (2 wheels) Transfers: Sit to/from Stand Sit to Stand: Min assist, +2 physical assistance, +2 safety/equipment           General transfer comment: VCs hand placement    Ambulation/Gait Ambulation/Gait assistance: Min assist, +2 safety/equipment Gait Distance (Feet): 45 Feet Assistive device: Rolling walker (2 wheels) Gait Pattern/deviations: Step-to pattern, Decreased step length - right, Decreased step length - left, Shuffle, Trunk flexed Gait velocity: decr     General Gait Details:  cues for posture and position from RW;  distance ltd by fatigue; HR 137 with ambulation, SpO2 94% on room air, 2/4 dyspnea   Stairs             Wheelchair Mobility     Tilt Bed    Modified Rankin (Stroke Patients Only)       Balance Overall balance assessment: Needs assistance Sitting-balance support: Feet supported, No upper extremity supported Sitting balance-Leahy Scale: Fair     Standing balance support: Bilateral upper extremity supported Standing balance-Leahy Scale: Poor Standing balance comment: posterior leaning in standing.                            Cognition Arousal: Alert Behavior During Therapy: WFL for tasks assessed/performed, Impulsive Overall Cognitive Status: Difficult to assess                                          Exercises      General Comments        Pertinent Vitals/Pain Pain Assessment Pain Assessment: No/denies pain    Home Living                          Prior Function            PT Goals (current goals can now be found in the care plan section) Acute Rehab PT Goals  Patient Stated Goal: Regain independence PT Goal Formulation: With patient Time For Goal Achievement: 01/20/24 Potential to Achieve Goals: Fair Progress towards PT goals: Progressing toward goals    Frequency    Min 1X/week      PT Plan      Co-evaluation              AM-PAC PT "6 Clicks" Mobility   Outcome Measure  Help needed turning from your back to your side while in a flat bed without using bedrails?: A Little Help needed moving from lying on your back to sitting on the side of a flat bed without using bedrails?: A Little Help needed moving to and from a bed to a chair (including a wheelchair)?: A Little Help needed standing up from a chair using your arms (e.g., wheelchair or bedside chair)?: A Little Help needed to walk in hospital room?: A Little Help needed climbing 3-5 steps with a  railing? : A Lot 6 Click Score: 17    End of Session Equipment Utilized During Treatment: Gait belt Activity Tolerance: Patient tolerated treatment well;Patient limited by fatigue Patient left: in chair;with call bell/phone within reach;with chair alarm set;with family/visitor present;with nursing/sitter in room Nurse Communication: Mobility status PT Visit Diagnosis: Unsteadiness on feet (R26.81);Muscle weakness (generalized) (M62.81);Difficulty in walking, not elsewhere classified (R26.2)     Time: 1152-1202 PT Time Calculation (min) (ACUTE ONLY): 10 min  Charges:    $Gait Training: 8-22 mins PT General Charges $$ ACUTE PT VISIT: 1 Visit                     Tamala Ser PT 01/08/2024  Acute Rehabilitation Services  Office [phone]

## 2024-01-08 NOTE — Progress Notes (Signed)
PROGRESS NOTE Charles Richard  ZOX:[id number] DOB: 12/15/1929 DOA: 01/05/2024 PCP: [facility]  Brief Narrative/Hospital Course: 61 yom w/ history of AAA, osteoarthritis, atrial fibrillation, hx of COVID-19, type 2 diabetes, hypertension, impaired hearing, history of CVA, hyperlipidemia, history of sepsis, bilateral heel ulcers, left-sided nephrolithiasis with hydronephrosis, UTI and urosepsis requiring left ureteral stent placement who was brought to the ED due to altered mental status, cough and a 7-day history of flank pain that has gotten significantly worse over the last 2 days. In the ED: BP soft in 90s, workup showed leukocytosis significant AKI hyperkalemia creat 6.6 b/l 1.5-2.0, CT abdomen showed obstruction of kidney with a stone on the left with a left-sided hydronephrosis.  Urology consulted IV antibiotic IV fluids started and admitted Patient underwent emergent cystoscopy with left JJ stent placement and was admitted to ICU/stepdown.   Subjective: Resting comfortably on bedside chair Feels much better Family at the bedside Overnight afebrile BP improving after resuming Coreg  Leukocytosis resolved HOH UOP up at 3.6 liter  Assessment and Plan: Principal Problem:   Acute kidney injury superimposed on stage 3b chronic kidney disease (HCC) Active Problems:   Ureteral stone with hydronephrosis   Essential hypertension   Type 2 diabetes mellitus (HCC)   Hyponatremia   Physical deconditioning   Permanent atrial fibrillation (HCC)   Hyperkalemia   Protein-calorie malnutrition, severe (HCC)   Coronary arteriosclerosis   Hyperlipidemia   Class 1 obesity   Hydroureter on left   Sepsis secondary to UTI (HCC)   Sepsis POA due to UTI with ureteral calculus: Left-sided hydronephrosis: S/p emergent cystoscopy with left ureteral stent placement 1/25.  Blood cultures NGTD.  Having good urine output, urology planning for left ureteroscopic stone extraction in 2 to 3 weeks  as OP.  Afebrile and leukocytosis resolved.  Continue IV ceftriaxone.  Remove Foley if okay with urology today Recent Labs  Lab 01/05/24 [phone] 01/05/24 0834 01/05/24 0857 01/05/24 1118 01/06/24 0454 01/07/24 0241 01/08/24 0248  WBC 5.6 13.6*  --   --  10.8* 12.7* 9.6  LATICACIDVEN  --   --  0.9 0.6 <0.3*  --   --       AKI on CKD 3b Obstructive uropathy Hyperkalemia Metabolic acidosis: Patient with obstructive uropathy sepsis resulting in AKI hyperkalemia metabolic acidosis-underwent stent placement 1/25 baseline creatinine in mid twos, UA is negative.  Nephrology following management with IV fluid hydration defer IVF further to nephrology.  Creatinine slowly downtrending.  Potassium has stabilized.  Monitor urine output, avoid nephrotoxic medication, no indication for HD at this time. Recent Labs    01/05/24 0817 01/05/24 1108 01/05/24 1118 01/05/24 1243 01/05/24 1321 01/05/24 1659 01/06/24 0324 01/06/24 0454 01/07/24 0241 01/07/24 0242 01/08/24 0248  BUN 76* 78* 83*  --  79*  --  76*  --  79* 81* 75*  CREATININE 6.63* 6.68* 7.30*  --  6.90*  --  6.12*  --  5.82* 5.72* 5.05*  CO2 11* 16*  --   --   --   --  16*  --  16* 16* 18*  K 6.9* 5.9* 5.9*   < > 5.2*   < > 5.5* 5.5* 4.6 4.6 4.9   < > = values in this interval not displayed.    Hyponatremia: Due to sepsis and dehydration. Resolved  Essential hypertension Coronary arteriosclerosis Hyperlipidemia: BP was poorly controlled, Coreg resumed 1/27 at half dose-BP still remains high-resume home dose  1/28 continue home statin monitor  VS  Permanent  atrial fibrillation: Apixaban resumed 1/26 after discussing with Dr. Ronne Binning. Coreg resumed 1/27-SEE ABOVE.  Severe protein calorie malnutrition: Augment diet.  T2DM: Holding sitagliptin due to UTI follow-up A1c stable 6.5 blood sugar remains controlled on SS Recent Labs  Lab 01/05/24 2053 01/05/24 2055 01/07/24 0732 01/07/24 1122 01/07/24 1607 01/07/24 2140  01/08/24 0819  GLUCAP  --    < > 136* 134* 122* 120* 97  HGBA1C 6.5*  --   --   --   --   --   --    < > = values in this interval not displayed.    Physical deconditioning: Cont PT OT   Mild confusion at home at baseline Acute metabolic encephalopathy w/ agitation overnight 1/25 Hard of hearing Chronic Insomnia:: Needed haldol for agitation intermittently.  Continue hearing aid, delirium precaution continue home chronic Ambien AND USE PRN HALDOL Encourage PT OT ambulation plan is for home with home health  Obesity: Patient's Body mass index is 34.09 kg/m. : Will benefit with PCP follow-up, weight loss  healthy lifestyle and outpatient sleep evaluation.   DVT prophylaxis: apixaban (ELIQUIS) tablet 2.5 mg Start: 01/06/24 1245 SCD's Start: 01/05/24 1346 Place and maintain sequential compression device Start: 01/05/24 1145 Code Status:   Code Status: Do not attempt resuscitation (DNR) PRE-ARREST INTERVENTIONS DESIRED Family Communication: plan of care discussed with patient/ daughter at bedside updated Patient status is: Remains hospitalized because of severity of illness Level of care: Stepdown > will transfer out of ICU today  Dispo: The patient is from: home with daughter- uses walker at baseline            Anticipated disposition: Hh W/ Daughter 1-2 days if cleared by urology and nephrology Objective: Vitals last 24 hrs: Vitals:   01/08/24 0600 01/08/24 0603 01/08/24 0700 01/08/24 0800  BP: 128/69  (!) 150/44 (!) 169/84  Pulse: 98 93 93 (!) 108  Resp: 20 17 15 20   Temp:  98.2 F (36.8 C)  98.5 F (36.9 C)  TempSrc:  Oral  Oral  SpO2: 95% 94% 94% 96%  Weight:      Height:       Weight change:   Physical Examination: General exam: alert awake, elderly, hard of hearing  HEENT:Oral mucosa moist, Ear/Nose WNL grossly Respiratory system: Bilaterally clear BS,no use of accessory muscle Cardiovascular system: S1 & S2 +, No JVD. Gastrointestinal system: Abdomen  soft,NT,ND, BS+ Nervous System: Alert, awake, moving all extremities,and following commands. Extremities: LE edema neg,distal peripheral pulses palpable and warm.  Skin: No rashes,no icterus. MSK: Normal muscle bulk,tone, power  Foley catheter in place with clear urine.  Medications reviewed:  Scheduled Meds:  apixaban  2.5 mg Oral BID   carvedilol  6.25 mg Oral BID WC   Chlorhexidine Gluconate Cloth  6 each Topical Daily   docusate sodium  100 mg Oral Daily   gabapentin  300 mg Oral QHS   latanoprost  1 drop Both Eyes QHS   loratadine  10 mg Oral Q breakfast   meclizine  25 mg Oral Daily   polyvinyl alcohol  1 drop Both Eyes Daily   senna-docusate  1 tablet Oral Daily   simvastatin  10 mg Oral Daily   zolpidem  10 mg Oral QHS  Continuous Infusions:  cefTRIAXone (ROCEPHIN)  IV Stopped (01/08/24 0829)   lactated ringers Stopped (01/08/24 0758)    Diet Order             Diet renal/carb modified with fluid restriction Diet-HS  Snack? Nothing; Fluid restriction: 1200 mL Fluid; Room service appropriate? Yes; Fluid consistency: Thin  Diet effective now                  Intake/Output Summary (Last 24 hours) at 01/08/2024 1042 Last data filed at 01/08/2024 0800 Gross per 24 hour  Intake 1462.17 ml  Output 3675 ml  Net -2212.83 ml   Net IO Since Admission: -605.85 mL [01/08/24 1042]  Wt Readings from Last 3 Encounters:  01/07/24 114 kg  08/01/21 103.9 kg  05/05/20 97.5 kg     Unresulted Labs (From admission, onward)     Start     Ordered   01/09/24 0500  Basic metabolic panel  Daily,   R     Question:  Specimen collection method  Answer:  Lab=Lab collect   01/08/24 0857   01/07/24 0500  CBC  Daily,   R     Question:  Specimen collection method  Answer:  Lab=Lab collect   01/06/24 0741   Signed and Held  CBC  Tomorrow morning,   R        Signed and Held          Data Reviewed: I have personally reviewed following labs and imaging studies CBC: Recent Labs  Lab  01/05/24 0817 01/05/24 0834 01/05/24 1118 01/05/24 1321 01/06/24 0454 01/07/24 0241 01/08/24 0248  WBC 5.6 13.6*  --   --  10.8* 12.7* 9.6  NEUTROABS 4.2  --   --   --   --   --   --   HGB 3.7* 10.4* 9.5* 9.5* 9.7* 10.2* 9.4*  HCT 13.1* 32.8* 28.0* 28.0* 30.2* 31.7* 29.6*  MCV 112.0* 101.2*  --   --  100.3* 98.1 99.7  PLT 84* 219  --   --  200 253 255   Basic Metabolic Panel:  Recent Labs  Lab 01/05/24 1108 01/05/24 1118 01/05/24 1321 01/05/24 1659 01/06/24 0324 01/06/24 0454 01/07/24 0241 01/07/24 0242 01/08/24 0248  NA 133*   < > 133*  --  133*  --  137 136 138  K 5.9*   < > 5.2*   < > 5.5* 5.5* 4.6 4.6 4.9  CL 106   < > 107  --  107  --  107 107 108  CO2 16*  --   --   --  16*  --  16* 16* 18*  GLUCOSE 111*   < > 125*  --  150*  --  117* 117* 111*  BUN 78*   < > 79*  --  76*  --  79* 81* 75*  CREATININE 6.68*   < > 6.90*  --  6.12*  --  5.82* 5.72* 5.05*  CALCIUM 8.0*  --   --   --  8.3*  --  8.4* 8.4* 7.8*  PHOS  --   --   --   --  6.4*  --   --  6.3* 5.3*   < > = values in this interval not displayed.   GFR: Estimated Creatinine Clearance: 12.4 mL/min (A) (by C-G formula based on SCr of 5.05 mg/dL (H)). Liver Function Tests:  Recent Labs  Lab 01/05/24 0817 01/06/24 0324 01/07/24 0242 01/08/24 0248  AST 26  --   --   --   ALT 14  --   --   --   ALKPHOS 46  --   --   --   BILITOT 1.3*  --   --   --  PROT 5.8*  --   --   --   ALBUMIN 2.4* 2.1* 2.3* 2.2*   Recent Labs  Lab 01/05/24 0850  LIPASE 29   No results for input(s): "AMMONIA" in the last 168 hours. Coagulation Profile:  Recent Labs  Lab 01/05/24 0834 01/06/24 0454  INR 1.7* 1.6*   No results for input(s): "PROBNP" in the last 168 hours.  Recent Labs    01/05/24 2053  HGBA1C 6.5*   Recent Labs  Lab 01/07/24 0732 01/07/24 1122 01/07/24 1607 01/07/24 2140 01/08/24 0819  GLUCAP 136* 134* 122* 120* 97   Recent Labs  Lab 01/05/24 0857 01/05/24 1118 01/06/24 0454  LATICACIDVEN 0.9  0.6 <0.3*   Recent Results (from the past 240 hours)  Resp panel by RT-PCR (RSV, Flu A&B, Covid) Anterior Nasal Swab     Status: None   Collection Time: 01/05/24  8:17 AM   Specimen: Anterior Nasal Swab  Result Value Ref Range Status   SARS Coronavirus 2 by RT PCR NEGATIVE NEGATIVE Final    Comment: (NOTE) SARS-CoV-2 target nucleic acids are NOT DETECTED.  The SARS-CoV-2 RNA is generally detectable in upper respiratory specimens during the acute phase of infection. The lowest concentration of SARS-CoV-2 viral copies this assay can detect is 138 copies/mL. A negative result does not preclude SARS-Cov-2 infection and should not be used as the sole basis for treatment or other patient management decisions. A negative result may occur with  improper specimen collection/handling, submission of specimen other than nasopharyngeal swab, presence of viral mutation(s) within the areas targeted by this assay, and inadequate number of viral copies(<138 copies/mL). A negative result must be combined with clinical observations, patient history, and epidemiological information. The expected result is Negative.  Fact Sheet for Patients:  BloggerCourse.com  Fact Sheet for Healthcare Providers:  SeriousBroker.it  This test is no t yet approved or cleared by the [place] FDA and  has been authorized for detection and/or diagnosis of SARS-CoV-2 by FDA under an Emergency Use Authorization (EUA). This EUA will remain  in effect (meaning this test can be used) for the duration of the COVID-19 declaration under Section 564(b)(1) of the Act, 21 U.S.C.section 360bbb-3(b)(1), unless the authorization is terminated  or revoked sooner.       Influenza A by PCR NEGATIVE NEGATIVE Final   Influenza B by PCR NEGATIVE NEGATIVE Final    Comment: (NOTE) The Xpert Xpress SARS-CoV-2/FLU/RSV plus assay is intended as an aid in the diagnosis of influenza from  Nasopharyngeal swab specimens and should not be used as a sole basis for treatment. Nasal washings and aspirates are unacceptable for Xpert Xpress SARS-CoV-2/FLU/RSV testing.  Fact Sheet for Patients: BloggerCourse.com  Fact Sheet for Healthcare Providers: SeriousBroker.it  This test is not yet approved or cleared by the [place] FDA and has been authorized for detection and/or diagnosis of SARS-CoV-2 by FDA under an Emergency Use Authorization (EUA). This EUA will remain in effect (meaning this test can be used) for the duration of the COVID-19 declaration under Section 564(b)(1) of the Act, 21 U.S.C. section 360bbb-3(b)(1), unless the authorization is terminated or revoked.     Resp Syncytial Virus by PCR NEGATIVE NEGATIVE Final    Comment: (NOTE) Fact Sheet for Patients: BloggerCourse.com  Fact Sheet for Healthcare Providers: SeriousBroker.it  This test is not yet approved or cleared by the [place] FDA and has been authorized for detection and/or diagnosis of SARS-CoV-2 by FDA under an Emergency Use Authorization (EUA). This EUA will  remain in effect (meaning this test can be used) for the duration of the COVID-19 declaration under Section 564(b)(1) of the Act, 21 U.S.C. section 360bbb-3(b)(1), unless the authorization is terminated or revoked.  Performed at [facility], [address], [place], [place] [address]   Blood Culture (routine x 2)     Status: None (Preliminary result)   Collection Time: 01/05/24  8:34 AM   Specimen: BLOOD RIGHT ARM  Result Value Ref Range Status   Specimen Description   Final    BLOOD RIGHT ARM Performed at [facility], [address], [place], [place] [address]    Special Requests   Final    BOTTLES DRAWN AEROBIC AND ANAEROBIC Blood Culture results may not be optimal due to an inadequate volume of blood  received in culture bottles Performed at [facility], [address] [place], [place] [address]    Culture   Final    NO GROWTH 3 DAYS Performed at [facility], [address], [place], [place] [address]    Report Status PENDING  Incomplete  Blood Culture (routine x 2)     Status: None (Preliminary result)   Collection Time: 01/05/24  9:00 AM   Specimen: BLOOD LEFT ARM  Result Value Ref Range Status   Specimen Description   Final    BLOOD LEFT ARM Performed at [facility], [address], [place], [place] [address]    Special Requests   Final    BOTTLES DRAWN AEROBIC AND ANAEROBIC Blood Culture results may not be optimal due to an inadequate volume of blood received in culture bottles Performed at [facility], [address], [place], [place] [address]    Culture   Final    NO GROWTH 3 DAYS Performed at [facility], [address] [place], [place] [address]    Report Status PENDING  Incomplete    Antimicrobials/Microbiology: Anti-infectives (From admission, onward)    Start     Dose/Rate Route Frequency Ordered Stop   01/06/24 0800  cefTRIAXone (ROCEPHIN) 2 g in sodium chloride 0.9 % 100 mL IVPB        2 g 200 mL/hr over 30 Minutes Intravenous Every 24 hours 01/05/24 1826     01/05/24 1400  ceFAZolin (ANCEF) IVPB 2g/100 mL premix        2 g 200 mL/hr over 30 Minutes Intravenous  Once 01/05/24 1345 01/05/24 1809   01/05/24 0845  cefTRIAXone (ROCEPHIN) 2 g in sodium chloride 0.9 % 100 mL IVPB        2 g 200 mL/hr over 30 Minutes Intravenous Once 01/05/24 0834 01/05/24 1640         Component Value Date/Time   SDES  01/05/2024 0900    BLOOD LEFT ARM Performed at [facility], [address], [place], [place] [address]    SPECREQUEST  01/05/2024 0900    BOTTLES DRAWN AEROBIC AND ANAEROBIC Blood Culture results may not be optimal due to an inadequate volume of blood received in culture bottles Performed  at [facility], [address], [place], [place] [address]    CULT  01/05/2024 0900    NO GROWTH 3 DAYS Performed at [facility], [address] [place], [place] [address]    REPTSTATUS PENDING 01/05/2024 0900     Radiology Studies: No results found.   LOS: 3 days   Total time spent in review  of labs and imaging, patient evaluation, formulation of plan, documentation and communication with family: 35  minutes  Lanae Boast, MD  [facility]  01/08/2024, 10:42 AM

## 2024-01-08 NOTE — Progress Notes (Signed)
Nephrology Follow-Up Consult note   Assessment/Recommendations: Charles Richard is a/an 89 y.o. male with a past medical history significant for osteoarthritis, atrial fibrillation, DM2, HTN, impaired hearing, CVA, HLD, nephrolithiasis, admitted for obstructive nephrolithiasis.       AKI on CKD 3B: Baseline creatinine around 2.5 per daughter.  Creatinine improved from 6.6 down to 5.7 today.  Left ureteral stent placed on 1/25.  Urine output and creatinine improving -Continue to monitor daily Cr, Dose meds for GFR -Monitor Daily I/Os, Daily weight  -Maintain MAP>65 for optimal renal perfusion.  -Avoid nephrotoxic medications including NSAIDs -Use synthetic opioids (Fentanyl/Dilaudid) if needed -Currently no indication for HD -Encourage oral hydration -Stable for discharge from nephrology perspective.  Has nephrology [facility] the [facility]  Hyperkalemia: Has improved with relief of obstruction  Obstruction: Status post stent placement.  Antibiotics per primary team  Hypertension: Continue home medications  ICU delirium: Management per primary team.  Seems improved today  Recommendations conveyed to primary service.    Darnell Level [facility] 01/08/2024 12:20 PM  ___________________________________________________________  CC: AKI  Interval History/Subjective: No complaints today. Good UOP. Crt slowly improving.   Medications:  Current Facility-Administered Medications  Medication Dose Route Frequency Provider Last Rate Last Admin   acetaminophen (TYLENOL) tablet 650 mg  650 mg Oral Q6H PRN Bobette Mo, MD       Or   acetaminophen (TYLENOL) suppository 650 mg  650 mg Rectal Q6H PRN Bobette Mo, MD       albuterol (PROVENTIL) (2.5 MG/3ML) 0.083% nebulizer solution 2.5 mg  2.5 mg Nebulization Q6H PRN Bobette Mo, MD   2.5 mg at 01/07/24 1113   apixaban (ELIQUIS) tablet 2.5 mg  2.5 mg Oral BID Kc, Dayna Barker, MD   2.5 mg at 01/08/24 1014    carvedilol (COREG) tablet 6.25 mg  6.25 mg Oral BID WC Kc, Ramesh, MD       cefTRIAXone (ROCEPHIN) 2 g in sodium chloride 0.9 % 100 mL IVPB  2 g Intravenous Q24H Bobette Mo, MD   Stopped at 01/08/24 1610   Chlorhexidine Gluconate Cloth 2 % PADS 6 each  6 each Topical Daily Bobette Mo, MD   6 each at 01/08/24 1016   docusate sodium (COLACE) capsule 100 mg  100 mg Oral Daily Bobette Mo, MD   100 mg at 01/08/24 1015   gabapentin (NEURONTIN) capsule 300 mg  300 mg Oral QHS Bobette Mo, MD   300 mg at 01/07/24 2107   lactated ringers infusion   Intravenous Continuous Luiz Iron, NP   Stopped at 01/08/24 0758   latanoprost (XALATAN) 0.005 % ophthalmic solution 1 drop  1 drop Both Eyes QHS Bobette Mo, MD   1 drop at 01/07/24 2107   loratadine (CLARITIN) tablet 10 mg  10 mg Oral Q breakfast Bobette Mo, MD   10 mg at 01/08/24 0800   ondansetron [facility]) tablet 4 mg  4 mg Oral Q6H PRN Bobette Mo, MD       Or   ondansetron [facility]) injection 4 mg  4 mg Intravenous Q6H PRN Bobette Mo, MD       Oral care mouth rinse  15 mL Mouth Rinse PRN Kc, Ramesh, MD       polyvinyl alcohol (LIQUIFILM TEARS) 1.4 % ophthalmic solution 1 drop  1 drop Both Eyes Daily Len Childs T, RPH   1 drop at 01/08/24 1017   senna-docusate (Senokot-S) tablet  1 tablet  1 tablet Oral Daily Bobette Mo, MD   1 tablet at 01/08/24 1015   simvastatin (ZOCOR) tablet 10 mg  10 mg Oral Daily Bobette Mo, MD   10 mg at 01/08/24 1015   zolpidem (AMBIEN) tablet 10 mg  10 mg Oral Grier Rocher, MD   10 mg at 01/07/24 2107      Review of Systems: 10 systems reviewed and negative except per interval history/subjective  Physical Exam: Vitals:   01/08/24 1000 01/08/24 1100  BP:  (!) 151/85  Pulse:  92  Resp: (!) 26 16  Temp:    SpO2:  97%   Total I/O In: 60.2 [I.V.:59.4; IV Piggyback:0.8] Out: 750 [Urine:750]  Intake/Output Summary (Last 24  hours) at 01/08/2024 1220 Last data filed at 01/08/2024 1200 Gross per 24 hour  Intake 1462.17 ml  Output 4425 ml  Net -2962.83 ml   Constitutional: Elderly male, no distress ENMT: ears and nose without scars or lesions, MMM CV: normal rate, trace edema Respiratory: Lateral chest rise, normal work of breathing Gastrointestinal: soft, non-tender, no palpable masses or hernias Skin: no visible lesions or rashes Psych: Awake and alert, appropriate mood and affect   Test Results I personally reviewed new and old clinical labs and radiology tests Lab Results  Component Value Date   NA 138 01/08/2024   K 4.9 01/08/2024   CL 108 01/08/2024   CO2 18 (L) 01/08/2024   BUN 75 (H) 01/08/2024   CREATININE 5.05 (H) 01/08/2024   CALCIUM 7.8 (L) 01/08/2024   ALBUMIN 2.2 (L) 01/08/2024   PHOS 5.3 (H) 01/08/2024    CBC Recent Labs  Lab 01/05/24 0817 01/05/24 0834 01/06/24 0454 01/07/24 0241 01/08/24 0248  WBC 5.6   < > 10.8* 12.7* 9.6  NEUTROABS 4.2  --   --   --   --   HGB 3.7*   < > 9.7* 10.2* 9.4*  HCT 13.1*   < > 30.2* 31.7* 29.6*  MCV 112.0*   < > 100.3* 98.1 99.7  PLT 84*   < > 200 253 255   < > = values in this interval not displayed.

## 2024-01-08 NOTE — Plan of Care (Signed)

## 2024-01-09 DIAGNOSIS — N1832 Chronic kidney disease, stage 3b: Secondary | ICD-10-CM | POA: Diagnosis not present

## 2024-01-09 DIAGNOSIS — N179 Acute kidney failure, unspecified: Secondary | ICD-10-CM | POA: Diagnosis not present

## 2024-01-09 LAB — GLUCOSE, CAPILLARY
Glucose-Capillary: 115 mg/dL — ABNORMAL HIGH (ref 70–99)
Glucose-Capillary: 173 mg/dL — ABNORMAL HIGH (ref 70–99)

## 2024-01-09 LAB — BASIC METABOLIC PANEL
Anion gap: 13 (ref 5–15)
BUN: 70 mg/dL — ABNORMAL HIGH (ref 8–23)
CO2: 17 mmol/L — ABNORMAL LOW (ref 22–32)
Calcium: 7.8 mg/dL — ABNORMAL LOW (ref 8.9–10.3)
Chloride: 110 mmol/L (ref 98–111)
Creatinine, Ser: 4.54 mg/dL — ABNORMAL HIGH (ref 0.61–1.24)
GFR, Estimated: 12 mL/min — ABNORMAL LOW (ref >60)
Glucose, Bld: 109 mg/dL — ABNORMAL HIGH (ref 70–99)
Potassium: 4.5 mmol/L (ref 3.5–5.1)
Sodium: 140 mmol/L (ref 135–145)

## 2024-01-09 LAB — CBC
HCT: 29.9 % — ABNORMAL LOW (ref 39.0–52.0)
Hemoglobin: 9.3 g/dL — ABNORMAL LOW (ref 13.0–17.0)
MCH: 32.1 pg (ref 26.0–34.0)
MCHC: 31.1 g/dL (ref 30.0–36.0)
MCV: 103.1 fL — ABNORMAL HIGH (ref 80.0–100.0)
Platelets: 236 10*3/uL (ref 150–400)
RBC: 2.9 MIL/uL — ABNORMAL LOW (ref 4.22–5.81)
RDW: 12.5 % (ref 11.5–15.5)
WBC: 9.3 10*3/uL (ref 4.0–10.5)
nRBC: 0 % (ref 0.0–0.2)

## 2024-01-09 MED ORDER — GABAPENTIN 300 MG PO CAPS: 300.0000 mg | ORAL_CAPSULE | Freq: Every day | ORAL | Status: AC

## 2024-01-09 MED ORDER — CEFADROXIL 500 MG PO CAPS: 500.0000 mg | ORAL_CAPSULE | Freq: Every day | ORAL | 0 refills | Status: AC

## 2024-01-09 MED ORDER — CARVEDILOL 3.125 MG PO TABS
3.1250 mg | ORAL_TABLET | ORAL | Status: AC
  Administered 2024-01-09: 3.125 mg via ORAL
  Filled 2024-01-09: qty 1

## 2024-01-09 MED ORDER — CEFADROXIL 500 MG PO CAPS: 500.0000 mg | ORAL_CAPSULE | Freq: Every day | ORAL | Status: DC

## 2024-01-09 NOTE — Plan of Care (Signed)
  Problem: Education: Goal: Knowledge of General Education information will improve Description: Including pain rating scale, medication(s)/side effects and non-pharmacologic comfort measures Outcome: Progressing   Problem: Health Behavior/Discharge Planning: Goal: Ability to manage health-related needs will improve Outcome: Progressing   Problem: Clinical Measurements: Goal: Ability to maintain clinical measurements within normal limits will improve Outcome: Progressing Goal: Will remain free from infection Outcome: Progressing Goal: Diagnostic test results will improve Outcome: Progressing Goal: Respiratory complications will improve Outcome: Progressing Goal: Cardiovascular complication will be avoided Outcome: Progressing   Problem: Activity: Goal: Risk for activity intolerance will decrease Outcome: Progressing   Problem: Nutrition: Goal: Adequate nutrition will be maintained Outcome: Progressing   Problem: Coping: Goal: Level of anxiety will decrease Outcome: Progressing   Problem: Elimination: Goal: Will not experience complications related to bowel motility Outcome: Progressing Goal: Will not experience complications related to urinary retention Outcome: Progressing   Problem: Pain Managment: Goal: General experience of comfort will improve and/or be controlled Outcome: Progressing   Problem: Safety: Goal: Ability to remain free from injury will improve Outcome: Progressing   Problem: Education: Goal: Ability to describe self-care measures that may prevent or decrease complications (Diabetes Survival Skills Education) will improve Outcome: Progressing Goal: Individualized Educational Video(s) Outcome: Progressing   Problem: Coping: Goal: Ability to adjust to condition or change in health will improve Outcome: Progressing   Problem: Fluid Volume: Goal: Ability to maintain a balanced intake and output will improve Outcome: Progressing   Problem:  Health Behavior/Discharge Planning: Goal: Ability to identify and utilize available resources and services will improve Outcome: Progressing Goal: Ability to manage health-related needs will improve Outcome: Progressing   Problem: Metabolic: Goal: Ability to maintain appropriate glucose levels will improve Outcome: Progressing   Problem: Nutritional: Goal: Maintenance of adequate nutrition will improve Outcome: Progressing Goal: Progress toward achieving an optimal weight will improve Outcome: Progressing   Problem: Tissue Perfusion: Goal: Adequacy of tissue perfusion will improve Outcome: Progressing   Problem: Fluid Volume: Goal: Hemodynamic stability will improve Outcome: Progressing   Problem: Clinical Measurements: Goal: Diagnostic test results will improve Outcome: Progressing Goal: Signs and symptoms of infection will decrease Outcome: Progressing   Problem: Respiratory: Goal: Ability to maintain adequate ventilation will improve Outcome: Progressing

## 2024-01-09 NOTE — Plan of Care (Signed)
  Problem: Education: Goal: Knowledge of General Education information will improve Description: Including pain rating scale, medication(s)/side effects and non-pharmacologic comfort measures Outcome: Adequate for Discharge   Problem: Health Behavior/Discharge Planning: Goal: Ability to manage health-related needs will improve Outcome: Adequate for Discharge   Problem: Clinical Measurements: Goal: Ability to maintain clinical measurements within normal limits will improve Outcome: Adequate for Discharge Goal: Will remain free from infection Outcome: Adequate for Discharge Goal: Diagnostic test results will improve Outcome: Adequate for Discharge Goal: Respiratory complications will improve Outcome: Adequate for Discharge Goal: Cardiovascular complication will be avoided Outcome: Adequate for Discharge   Problem: Activity: Goal: Risk for activity intolerance will decrease Outcome: Adequate for Discharge   Problem: Nutrition: Goal: Adequate nutrition will be maintained Outcome: Adequate for Discharge   Problem: Coping: Goal: Level of anxiety will decrease Outcome: Adequate for Discharge   Problem: Elimination: Goal: Will not experience complications related to bowel motility Outcome: Adequate for Discharge Goal: Will not experience complications related to urinary retention Outcome: Adequate for Discharge   Problem: Pain Managment: Goal: General experience of comfort will improve and/or be controlled Outcome: Adequate for Discharge   Problem: Safety: Goal: Ability to remain free from injury will improve Outcome: Adequate for Discharge   Problem: Skin Integrity: Goal: Risk for impaired skin integrity will decrease Outcome: Adequate for Discharge   Problem: Education: Goal: Ability to describe self-care measures that may prevent or decrease complications (Diabetes Survival Skills Education) will improve Outcome: Adequate for Discharge Goal: Individualized Educational  Video(s) Outcome: Adequate for Discharge   Problem: Coping: Goal: Ability to adjust to condition or change in health will improve Outcome: Adequate for Discharge   Problem: Fluid Volume: Goal: Ability to maintain a balanced intake and output will improve Outcome: Adequate for Discharge   Problem: Health Behavior/Discharge Planning: Goal: Ability to identify and utilize available resources and services will improve Outcome: Adequate for Discharge Goal: Ability to manage health-related needs will improve Outcome: Adequate for Discharge   Problem: Metabolic: Goal: Ability to maintain appropriate glucose levels will improve Outcome: Adequate for Discharge   Problem: Nutritional: Goal: Maintenance of adequate nutrition will improve Outcome: Adequate for Discharge Goal: Progress toward achieving an optimal weight will improve Outcome: Adequate for Discharge   Problem: Skin Integrity: Goal: Risk for impaired skin integrity will decrease Outcome: Adequate for Discharge   Problem: Tissue Perfusion: Goal: Adequacy of tissue perfusion will improve Outcome: Adequate for Discharge   Problem: Fluid Volume: Goal: Hemodynamic stability will improve Outcome: Adequate for Discharge   Problem: Clinical Measurements: Goal: Diagnostic test results will improve Outcome: Adequate for Discharge Goal: Signs and symptoms of infection will decrease Outcome: Adequate for Discharge   Problem: Respiratory: Goal: Ability to maintain adequate ventilation will improve Outcome: Adequate for Discharge

## 2024-01-09 NOTE — TOC Transition Note (Signed)
Transition of Care [facility]) - Discharge Note   Patient Details  Name: Charles Richard MRN: [id number] Date of Birth: 1930/11/12  [facility] CM/SW Contact:  Lanier Clam, RN Phone Number: 01/09/2024, 11:10 AM   Clinical Narrative:  d/c home w/[facility] Marylene Land aware. No further CM needs.     Final next level of care: Home w Home Health Services Barriers to Discharge: No Barriers Identified   Patient Goals and CMS Choice   CMS Medicare.gov Compare Post Acute Care list provided to:: Patient Represenative (must comment) Choice offered to / list presented to : Adult Children [place] ownership interest in [facility]provided to:: Adult Children    Discharge Placement                       Discharge Plan and Services Additional resources added to the After Visit Summary for     Discharge Planning Services: CM Consult Post Acute Care Choice: Home Health                    HH Arranged: PT          Social Drivers of Health (SDOH) Interventions SDOH Screenings   Food Insecurity: No Food Insecurity (01/05/2024)  Housing: Low Risk  (01/05/2024)  Transportation Needs: No Transportation Needs (01/05/2024)  Utilities: Not At Risk (01/05/2024)  Social Connections: Patient Unable To Answer (01/05/2024)  Tobacco Use: Low Risk  (01/05/2024)     Readmission Risk Interventions    01/07/2024   10:49 AM  Readmission Risk Prevention Plan  Transportation Screening Complete  HRI or Home Care Consult Complete  Social Work Consult for Recovery Care Planning/Counseling Complete  Palliative Care Screening Not Applicable  Medication Review Oceanographer) Complete

## 2024-01-09 NOTE — Discharge Summary (Signed)
Physician Discharge Summary  Charles Richard ZOX:[id number] DOB: 09-11-1930 DOA: 01/05/2024  PCP: [facility]  Admit date: 01/05/2024 Discharge date: 01/09/2024 Recommendations for Outpatient Follow-up:  Follow up with PCP in 1 weeks-call for appointment Follow-up with Dr. Ronne Binning from your to remove Foley catheter as well as stent and stone management Please obtain BMP/CBC in one week  Discharge Dispo: Home w/ [facility] Discharge Condition: Stable Code Status:   Code Status: Do not attempt resuscitation (DNR) PRE-ARREST INTERVENTIONS DESIRED Diet recommendation:  Diet Order             Diet renal/carb modified with fluid restriction Diet-HS Snack? Nothing; Fluid restriction: 1200 mL Fluid; Room service appropriate? Yes; Fluid consistency: Thin  Diet effective now                    Brief/Interim Summary: 86 yom w/ history of AAA, osteoarthritis, atrial fibrillation, hx of COVID-19, type 2 diabetes, hypertension, impaired hearing, history of CVA, hyperlipidemia, history of sepsis, bilateral heel ulcers, left-sided nephrolithiasis with hydronephrosis, UTI and urosepsis requiring left ureteral stent placement who was brought to the ED due to altered mental status, cough and a 7-day history of flank pain that has gotten significantly worse over the last 2 days. In the ED: BP soft in 90s, workup showed leukocytosis significant AKI hyperkalemia creat 6.6 b/l 1.5-2.0, CT abdomen showed obstruction of kidney with a stone on the left with a left-sided hydronephrosis.  Urology consulted IV antibiotic IV fluids started and admitted Patient underwent emergent cystoscopy with left JJ stent placement and was admitted to ICU/stepdown. Creatinine nicely improving with IV fluids, patient taking p.o. well IV fluids discontinued 1/28 and nephrology has cleared the patient for discharge home with follow-up at [facility] urology advised to keep the Foley catheter x 7 days postprocedure-follow-up outpatient  for TOV, patient will need left ureteroscopic stone extraction in 2 to 3 weeks.  At this time patient is doing well, Daughter Feels patient will doing much better at home, nephrology is cleared the patient for discharge including urology with Foley catheter with oral antibiotics    Discharge Diagnoses:  Principal Problem:   Acute kidney injury superimposed on stage 3b chronic kidney disease (HCC) Active Problems:   Ureteral stone with hydronephrosis   Essential hypertension   Type 2 diabetes mellitus (HCC)   Hyponatremia   Physical deconditioning   Permanent atrial fibrillation (HCC)   Hyperkalemia   Protein-calorie malnutrition, severe (HCC)   Coronary arteriosclerosis   Hyperlipidemia   Class 1 obesity   Hydroureter on left   Sepsis secondary to UTI (HCC)  Sepsis POA due to UTI with ureteral calculus: Left-sided hydronephrosis: S/p emergent cystoscopy with left ureteral stent placement 1/25.  Blood cultures NGTD.  Having good urine output, urology planning for left ureteroscopic stone extraction in 2 to 3 weeks as OP.  Afebrile and leukocytosis resolved.  Continue oral antibiotics to complete 10 days course continue Foley catheter and follow up with Urology for discharge planning.  AKI on CKD 3b Obstructive uropathy Hyperkalemia Metabolic acidosis: Patient with obstructive uropathy sepsis resulting in AKI hyperkalemia metabolic acidosis-underwent stent placement 1/25 baseline creatinine in mid twos, UA is negative.  Nephrology following management with IV fluid hydration and off IV fluids at this time creatinine nicely improving patient will need follow-up witg BMP with pcp and nephrology.  Nephrology has cleared the patient for discharge Recent Labs    01/05/24 0817 01/05/24 1108 01/05/24 1118 01/05/24 1243 01/05/24 1321 01/05/24 1659 01/06/24 0324  01/06/24 0454 01/07/24 0241 01/07/24 0242 01/08/24 0248 01/09/24 0240  BUN 76* 78* 83*  --  79*  --  76*  --  79* 81* 75*  70*  CREATININE 6.63* 6.68* 7.30*  --  6.90*  --  6.12*  --  5.82* 5.72* 5.05* 4.54*  CO2 11* 16*  --   --   --   --  16*  --  16* 16* 18* 17*  K 6.9* 5.9* 5.9*   < > 5.2*   < > 5.5* 5.5* 4.6 4.6 4.9 4.5   < > = values in this interval not displayed.    Hyponatremia: Due to sepsis and dehydration. Resolved  Essential hypertension Coronary arteriosclerosis Hyperlipidemia: BP was poorly controlled, well-controlled on Coreg with hold ACE inhibitor due to AKI   Permanent atrial fibrillation: Apixaban resumed 1/26 after discussing with Dr. Ronne Binning. Coreg resumed 1/27- and overall okay at times heart rate slightly on higher side, Eliquis on renal dosing.    Severe protein calorie malnutrition: Augment diet.  T2DM: Holding sitagliptin due to UTI follow-up A1c stable 6.5 blood sugar remains controlled on SS-hold off metformin continue Januvia Recent Labs  Lab 01/05/24 2053 01/05/24 2055 01/08/24 1203 01/08/24 1621 01/08/24 2117 01/09/24 0759 01/09/24 1142  GLUCAP  --    < > 124* 124* 130* 115* 173*  HGBA1C 6.5*  --   --   --   --   --   --    < > = values in this interval not displayed.    Physical deconditioning: Cont PT OT > planning for home open Mild  Mild confusion at home at baseline Acute metabolic encephalopathy w/ agitation overnight 1/25 Hard of hearing Chronic Insomnia:: Needed haldol for agitation intermittently.  At this time mental status is stable and improved.  Continue his home Ambien  Obesity: Patient's Body mass index is 34.09 kg/m. : Will benefit with PCP follow-up, weight loss  healthy lifestyle and outpatient sleep evaluation.   Consults: Urology, nephrology Subjective: Alert and oriented hard of hearing family at the bedside, would like to go home today eating and doing well  Discharge Exam: Vitals:   01/09/24 1000 01/09/24 1210  BP: 136/73 (!) 146/84  Pulse: 97 98  Resp: 19 (!) 21  Temp:  98.2 F (36.8 C)  SpO2: 97% 95%   General: Pt  is alert, awake, not in acute distress Cardiovascular: RRR, S1/S2 +, no rubs, no gallops Respiratory: CTA bilaterally, no wheezing, no rhonchi Abdominal: Soft, NT, ND, bowel sounds + Extremities: no edema, no cyanosis  Discharge Instructions  Discharge Instructions     Discharge instructions   Complete by: As directed    Please call call MD or return to ER for similar or worsening recurring problem that brought you to hospital or if any fever,nausea/vomiting,abdominal pain, uncontrolled pain, chest pain,  shortness of breath or any other alarming symptoms.  Please follow-up your doctor as instructed in a week time and call the office for appointment.  Please avoid alcohol, smoking, or any other illicit substance and maintain healthy habits including taking your regular medications as prescribed.  You were cared for by a hospitalist during your hospital stay. If you have any questions about your discharge medications or the care you received while you were in the hospital after you are discharged, you can call the unit and ask to speak with the hospitalist on call if the hospitalist that took care of you is not available.  Once you are discharged,  your primary care physician will handle any further medical issues. Please note that NO REFILLS for any discharge medications will be authorized once you are discharged, as it is imperative that you return to your primary care physician (or establish a relationship with a primary care physician if you do not have one) for your aftercare needs so that they can reassess your need for medications and monitor your lab values   Increase activity slowly   Complete by: As directed    No wound care   Complete by: As directed       Allergies as of 01/09/2024   No Known Allergies      Medication List     STOP taking these medications    cephALEXin 500 MG capsule Commonly known as: KEFLEX   lisinopril 20 MG tablet Commonly known as: ZESTRIL    lisinopril 40 MG tablet Commonly known as: ZESTRIL   metFORMIN 500 MG tablet Commonly known as: GLUCOPHAGE   traMADol 50 MG tablet Commonly known as: ULTRAM       TAKE these medications    apixaban 5 MG Tabs tablet Commonly known as: ELIQUIS Take 2.5 mg by mouth 2 (two) times daily.   carvedilol 6.25 MG tablet Commonly known as: COREG Take 1 tablet (6.25 mg total) by mouth 2 (two) times daily with a meal. What changed: when to take this   cefadroxil 500 MG capsule Commonly known as: DURICEF Take 1 capsule (500 mg total) by mouth daily for 7 days. Start taking on: January 10, 2024   docusate sodium 100 MG capsule Commonly known as: COLACE Take 100 mg by mouth daily.   fluticasone 50 MCG/ACT nasal spray Commonly known as: FLONASE Place 1 spray into both nostrils daily as needed for allergies.   gabapentin 300 MG capsule Commonly known as: NEURONTIN Take 1 capsule (300 mg total) by mouth at bedtime. What changed: when to take this   Glucerna Liqd Take 237 mLs by mouth daily.   latanoprost 0.005 % ophthalmic solution Commonly known as: XALATAN Place 1 drop into both eyes at bedtime.   loratadine 10 MG tablet Commonly known as: CLARITIN Take 10 mg by mouth daily with breakfast.   meclizine 25 MG tablet Commonly known as: ANTIVERT Take 25 mg by mouth daily.   multivitamin with minerals Tabs tablet Take 1 tablet by mouth daily.   nystatin-triamcinolone cream Commonly known as: MYCOLOG II Apply 1 Application topically as needed (sores).   ondansetron 8 MG tablet Commonly known as: ZOFRAN Take 8 mg by mouth daily as needed for nausea or vomiting.   Propylene Glycol 0.6 % Soln Place 2 drops into both eyes daily.   senna-docusate 8.6-50 MG tablet Commonly known as: Senokot-S Take 1 tablet by mouth 2 (two) times daily. What changed: when to take this   simvastatin 20 MG tablet Commonly known as: ZOCOR Take 10 mg by mouth daily.   Vitamin D 50 MCG  (2000 UT) tablet Take 2,000 Units by mouth daily.   Zituvio 25 MG Tabs Generic drug: SITagliptin Take 25 mg by mouth daily.   zolpidem 10 MG tablet Commonly known as: AMBIEN Take 10 mg by mouth at bedtime.        Follow-up Information     [facility] Follow up.   Why: HH physical therapy [phone]        McKenzie, Mardene Celeste, MD Follow up in 3 day(s).   Specialty: Urology Why: Follow-up in 3 days to  remove Foley catheter Contact information: [address] [place] [address] [phone]                No Known Allergies  The results of significant diagnostics from this hospitalization (including imaging, microbiology, ancillary and laboratory) are listed below for reference.    Microbiology: Recent Results (from the past 240 hours)  Resp panel by RT-PCR (RSV, Flu A&B, Covid) Anterior Nasal Swab     Status: None   Collection Time: 01/05/24  8:17 AM   Specimen: Anterior Nasal Swab  Result Value Ref Range Status   SARS Coronavirus 2 by RT PCR NEGATIVE NEGATIVE Final    Comment: (NOTE) SARS-CoV-2 target nucleic acids are NOT DETECTED.  The SARS-CoV-2 RNA is generally detectable in upper respiratory specimens during the acute phase of infection. The lowest concentration of SARS-CoV-2 viral copies this assay can detect is 138 copies/mL. A negative result does not preclude SARS-Cov-2 infection and should not be used as the sole basis for treatment or other patient management decisions. A negative result may occur with  improper specimen collection/handling, submission of specimen other than nasopharyngeal swab, presence of viral mutation(s) within the areas targeted by this assay, and inadequate number of viral copies(<138 copies/mL). A negative result must be combined with clinical observations, patient history, and epidemiological information. The expected result is Negative.  Fact Sheet for Patients:   BloggerCourse.com  Fact Sheet for Healthcare Providers:  SeriousBroker.it  This test is no t yet approved or cleared by the [place] FDA and  has been authorized for detection and/or diagnosis of SARS-CoV-2 by FDA under an Emergency Use Authorization (EUA). This EUA will remain  in effect (meaning this test can be used) for the duration of the COVID-19 declaration under Section 564(b)(1) of the Act, 21 U.S.C.section 360bbb-3(b)(1), unless the authorization is terminated  or revoked sooner.       Influenza A by PCR NEGATIVE NEGATIVE Final   Influenza B by PCR NEGATIVE NEGATIVE Final    Comment: (NOTE) The Xpert Xpress SARS-CoV-2/FLU/RSV plus assay is intended as an aid in the diagnosis of influenza from Nasopharyngeal swab specimens and should not be used as a sole basis for treatment. Nasal washings and aspirates are unacceptable for Xpert Xpress SARS-CoV-2/FLU/RSV testing.  Fact Sheet for Patients: BloggerCourse.com  Fact Sheet for Healthcare Providers: SeriousBroker.it  This test is not yet approved or cleared by the [place] FDA and has been authorized for detection and/or diagnosis of SARS-CoV-2 by FDA under an Emergency Use Authorization (EUA). This EUA will remain in effect (meaning this test can be used) for the duration of the COVID-19 declaration under Section 564(b)(1) of the Act, 21 U.S.C. section 360bbb-3(b)(1), unless the authorization is terminated or revoked.     Resp Syncytial Virus by PCR NEGATIVE NEGATIVE Final    Comment: (NOTE) Fact Sheet for Patients: BloggerCourse.com  Fact Sheet for Healthcare Providers: SeriousBroker.it  This test is not yet approved or cleared by the [place] FDA and has been authorized for detection and/or diagnosis of SARS-CoV-2 by FDA under an Emergency Use  Authorization (EUA). This EUA will remain in effect (meaning this test can be used) for the duration of the COVID-19 declaration under Section 564(b)(1) of the Act, 21 U.S.C. section 360bbb-3(b)(1), unless the authorization is terminated or revoked.  Performed at [facility], [address] [place], [place] [address]   Blood Culture (routine x 2)     Status: None (Preliminary result)   Collection  Time: 01/05/24  8:34 AM   Specimen: BLOOD RIGHT ARM  Result Value Ref Range Status   Specimen Description   Final    BLOOD RIGHT ARM Performed at [facility], [address], [place], [place] [address]    Special Requests   Final    BOTTLES DRAWN AEROBIC AND ANAEROBIC Blood Culture results may not be optimal due to an inadequate volume of blood received in culture bottles Performed at [facility], [address], [place], [place] [address]    Culture   Final    NO GROWTH 4 DAYS Performed at [facility], [address], [place], [place] [address]    Report Status PENDING  Incomplete  Blood Culture (routine x 2)     Status: None (Preliminary result)   Collection Time: 01/05/24  9:00 AM   Specimen: BLOOD LEFT ARM  Result Value Ref Range Status   Specimen Description   Final    BLOOD LEFT ARM Performed at [facility], [address], [place], [place] [address]    Special Requests   Final    BOTTLES DRAWN AEROBIC AND ANAEROBIC Blood Culture results may not be optimal due to an inadequate volume of blood received in culture bottles Performed at [facility], [address], [place], [place] [address]    Culture   Final    NO GROWTH 4 DAYS Performed at [facility], [address] [place], [place] [address]    Report Status PENDING  Incomplete    Procedures/Studies: DG C-Arm 1-60 Min-No Report Result Date: 01/05/2024 Fluoroscopy was utilized by the requesting physician.  No radiographic  interpretation.   CT Renal Stone Study Result Date: 01/05/2024 CLINICAL DATA:  Abdominal and flank pain.  Nephrolithiasis. EXAM: CT ABDOMEN AND PELVIS WITHOUT CONTRAST TECHNIQUE: Multidetector CT imaging of the abdomen and pelvis was performed following the standard protocol without IV contrast. RADIATION DOSE REDUCTION: This exam was performed according to the departmental dose-optimization program which includes automated exposure control, adjustment of the mA and/or kV according to patient size and/or use of iterative reconstruction technique. COMPARISON:  07/30/2021 FINDINGS: Lower chest: No acute findings. Hepatobiliary:  No mass visualized on this unenhanced exam. Pancreas: No mass or inflammatory process visualized on this unenhanced exam. Spleen:  Within normal limits in size. Adrenals/Urinary tract: Stable right renal benign Bosniak category 1 and 2 renal cysts again seen. Several renal calculi are seen bilaterally largest measuring 1 cm. Moderate left hydroureteronephrosis is seen due to a 6 mm calculus in the mid left ureter near the iliac vessels. A 2 mm calculus is also seen in the proximal left ureter and 2 small calculi are also seen in the urinary bladder. Stomach/Bowel: No evidence of obstruction, inflammatory process, or abnormal fluid collections. Vascular/Lymphatic: No evidence of abdominal aortic aneurysm. 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 No acute vascular calcification or lymphadenopathy identified. Reproductive:  No mass or other significant abnormality. Other:   Small fat-containing midline ventral hernia is stable. Musculoskeletal: No suspicious bone lesions identified. Mild T11 vertebral body fracture is seen which appears acute or subacute in age. IMPRESSION: Moderate left hydroureteronephrosis due to 6 mm calculus in mid left ureter. Additional 2 mm  calculus in proximal left ureter, and 2 small calculi in urinary bladder. Bilateral nephrolithiasis. Acute versus subacute compression fracture of the T11 vertebral body.  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 Stable small fat-containing midline ventral hernia. Electronically Signed   By: Danae Orleans M.D.   On: 01/05/2024 10:55   DG Chest Portable 1 View Result Date: 01/05/2024 CLINICAL DATA:  Cough. EXAM: PORTABLE CHEST 1 VIEW COMPARISON:  None Available. FINDINGS: The heart size and mediastinal contours are within normal limits. Both lungs are clear. Right shoulder prosthesis noted. IMPRESSION: No active disease. Electronically Signed   By: Danae Orleans M.D.   On: 01/05/2024 09:16    Labs: BNP (last 3 results) No results for input(s): "BNP" in the last 8760 hours. Basic Metabolic Panel: Recent Labs  Lab 01/06/24 0324 01/06/24 0454 01/07/24 0241 01/07/24 0242 01/08/24 0248 01/09/24 0240  NA 133*  --  137 136 138 140  K 5.5* 5.5* 4.6 4.6 4.9 4.5  CL 107  --  107 107 108 110  CO2 16*  --  16* 16* 18* 17*  GLUCOSE 150*  --  117* 117* 111* 109*  BUN 76*  --  79* 81* 75* 70*  CREATININE 6.12*  --  5.82* 5.72* 5.05* 4.54*  CALCIUM 8.3*  --  8.4* 8.4* 7.8* 7.8*  PHOS 6.4*  --   --  6.3* 5.3*  --    Liver Function Tests: Recent Labs  Lab 01/05/24 0817 01/06/24 0324 01/07/24 0242 01/08/24 0248  AST 26  --   --   --   ALT 14  --   --   --   ALKPHOS 46  --   --   --   BILITOT 1.3*  --   --   --   PROT 5.8*  --   --   --   ALBUMIN 2.4* 2.1* 2.3* 2.2*   Recent Labs  Lab 01/05/24 0850  LIPASE 29   No results for input(s): "AMMONIA" in the last 168 hours. CBC: Recent Labs  Lab 01/05/24 0817 01/05/24 0834 01/05/24 1118 01/05/24 1321 01/06/24 0454 01/07/24 0241 01/08/24 0248 01/09/24 0240  WBC 5.6 13.6*  --   --  10.8* 12.7* 9.6 9.3  NEUTROABS 4.2  --   --   --   --    --   --   --   HGB 3.7* 10.4*   < > 9.5* 9.7* 10.2* 9.4* 9.3*  HCT 13.1* 32.8*   < > 28.0* 30.2* 31.7* 29.6* 29.9*  MCV 112.0* 101.2*  --   --  100.3* 98.1 99.7 103.1*  PLT 84* 219  --   --  200 253 255 236   < > = values in this interval not displayed.   Cardiac Enzymes: No results for input(s): "CKTOTAL", "CKMB", "CKMBINDEX", "TROPONINI" in the last 168 hours. BNP: Invalid input(s): "POCBNP" CBG: Recent Labs  Lab 01/08/24 1203 01/08/24 1621 01/08/24 2117 01/09/24 0759 01/09/24 1142  GLUCAP 124* 124* 130* 115* 173*   D-Dimer No results for input(s): "DDIMER" in the last 72 hours. Hgb A1c No results for input(s): "HGBA1C" in the last 72 hours. Lipid Profile No results for input(s): "CHOL", "HDL", "LDLCALC", "TRIG", "CHOLHDL", "LDLDIRECT" in the last 72 hours. Thyroid function studies No results for input(s): "TSH", "T4TOTAL", "T3FREE", "THYROIDAB" in the last 72 hours.  Invalid input(s): "FREET3" Anemia work up No results for input(s): "VITAMINB12", "FOLATE", "FERRITIN", "TIBC", "IRON", "RETICCTPCT" in the last 72 hours. Urinalysis    Component Value Date/Time   COLORURINE STRAW (A) 01/05/2024 0833   APPEARANCEUR CLEAR  01/05/2024 0833   LABSPEC 1.008 01/05/2024 0833   PHURINE 5.0 01/05/2024 0833   GLUCOSEU 50 (A) 01/05/2024 0833   HGBUR MODERATE (A) 01/05/2024 0833   BILIRUBINUR NEGATIVE 01/05/2024 0833   KETONESUR NEGATIVE 01/05/2024 0833   PROTEINUR NEGATIVE 01/05/2024 0833   UROBILINOGEN 0.2 05/19/2009 0733   NITRITE NEGATIVE 01/05/2024 0833   LEUKOCYTESUR NEGATIVE 01/05/2024 0833   Sepsis Labs Recent Labs  Lab 01/06/24 0454 01/07/24 0241 01/08/24 0248 01/09/24 0240  WBC 10.8* 12.7* 9.6 9.3   Microbiology Recent Results (from the past 240 hours)  Resp panel by RT-PCR (RSV, Flu A&B, Covid) Anterior Nasal Swab     Status: None   Collection Time: 01/05/24  8:17 AM   Specimen: Anterior Nasal Swab  Result Value Ref Range Status   SARS Coronavirus 2 by RT  PCR NEGATIVE NEGATIVE Final    Comment: (NOTE) SARS-CoV-2 target nucleic acids are NOT DETECTED.  The SARS-CoV-2 RNA is generally detectable in upper respiratory specimens during the acute phase of infection. The lowest concentration of SARS-CoV-2 viral copies this assay can detect is 138 copies/mL. A negative result does not preclude SARS-Cov-2 infection and should not be used as the sole basis for treatment or other patient management decisions. A negative result may occur with  improper specimen collection/handling, submission of specimen other than nasopharyngeal swab, presence of viral mutation(s) within the areas targeted by this assay, and inadequate number of viral copies(<138 copies/mL). A negative result must be combined with clinical observations, patient history, and epidemiological information. The expected result is Negative.  Fact Sheet for Patients:  BloggerCourse.com  Fact Sheet for Healthcare Providers:  SeriousBroker.it  This test is no t yet approved or cleared by the [place] FDA and  has been authorized for detection and/or diagnosis of SARS-CoV-2 by FDA under an Emergency Use Authorization (EUA). This EUA will remain  in effect (meaning this test can be used) for the duration of the COVID-19 declaration under Section 564(b)(1) of the Act, 21 U.S.C.section 360bbb-3(b)(1), unless the authorization is terminated  or revoked sooner.       Influenza A by PCR NEGATIVE NEGATIVE Final   Influenza B by PCR NEGATIVE NEGATIVE Final    Comment: (NOTE) The Xpert Xpress SARS-CoV-2/FLU/RSV plus assay is intended as an aid in the diagnosis of influenza from Nasopharyngeal swab specimens and should not be used as a sole basis for treatment. Nasal washings and aspirates are unacceptable for Xpert Xpress SARS-CoV-2/FLU/RSV testing.  Fact Sheet for Patients: BloggerCourse.com  Fact Sheet for  Healthcare Providers: SeriousBroker.it  This test is not yet approved or cleared by the [place] FDA and has been authorized for detection and/or diagnosis of SARS-CoV-2 by FDA under an Emergency Use Authorization (EUA). This EUA will remain in effect (meaning this test can be used) for the duration of the COVID-19 declaration under Section 564(b)(1) of the Act, 21 U.S.C. section 360bbb-3(b)(1), unless the authorization is terminated or revoked.     Resp Syncytial Virus by PCR NEGATIVE NEGATIVE Final    Comment: (NOTE) Fact Sheet for Patients: BloggerCourse.com  Fact Sheet for Healthcare Providers: SeriousBroker.it  This test is not yet approved or cleared by the [place] FDA and has been authorized for detection and/or diagnosis of SARS-CoV-2 by FDA under an Emergency Use Authorization (EUA). This EUA will remain in effect (meaning this test can be used) for the duration of the COVID-19 declaration under Section 564(b)(1) of the Act, 21 U.S.C. section 360bbb-3(b)(1), unless the authorization is terminated  or revoked.  Performed at [facility], [address] [place], [place] [address]   Blood Culture (routine x 2)     Status: None (Preliminary result)   Collection Time: 01/05/24  8:34 AM   Specimen: BLOOD RIGHT ARM  Result Value Ref Range Status   Specimen Description   Final    BLOOD RIGHT ARM Performed at [facility], [address], [place], [place] [address]    Special Requests   Final    BOTTLES DRAWN AEROBIC AND ANAEROBIC Blood Culture results may not be optimal due to an inadequate volume of blood received in culture bottles Performed at [facility], [address], [place], [place] [address]    Culture   Final    NO GROWTH 4 DAYS Performed at [facility], [address], [place], [place] [address]    Report Status PENDING   Incomplete  Blood Culture (routine x 2)     Status: None (Preliminary result)   Collection Time: 01/05/24  9:00 AM   Specimen: BLOOD LEFT ARM  Result Value Ref Range Status   Specimen Description   Final    BLOOD LEFT ARM Performed at [facility], [address] [place], [place] [address]    Special Requests   Final    BOTTLES DRAWN AEROBIC AND ANAEROBIC Blood Culture results may not be optimal due to an inadequate volume of blood received in culture bottles Performed at [facility], [address] [place], [place] [address]    Culture   Final    NO GROWTH 4 DAYS Performed at [facility], [address], [place], [place] [address]    Report Status PENDING  Incomplete   Time coordinating discharge: 35 minutes  SIGNED: Lanae Boast, MD  [facility] 01/09/2024, 12:38 PM  If 7PM-7AM, please contact night-coverage [email or web address]

## 2024-01-09 NOTE — TOC Transition Note (Signed)
Transition of Care [facility]) - Discharge Note   Patient Details  Name: Charles Richard MRN: [id number] Date of Birth: [birth year]  Transition of Care [facility]) CM/SW Contact:  Lanier Clam, RN Phone Number: 01/09/2024, 1:33 PM   Clinical Narrative:  Added to [facility] HHPT/OT/aide-[place] aware.     Final next level of care: Home w Home Health Services Barriers to Discharge: No Barriers Identified   Patient Goals and CMS Choice   CMS Medicare.gov Compare Post Acute Care list provided to:: Patient Represenative (must comment) Choice offered to / list presented to : Adult Children [facility] ownership interest in [facility].provided to:: Adult Children    Discharge Placement                       Discharge Plan and Services Additional resources added to the After Visit Summary for     Discharge Planning Services: CM Consult Post Acute Care Choice: Home Health                    HH Arranged: PT, OT, Nurse's Aide HH Agency: [facility] Date [facility] Agency Contacted: 01/09/24 Time HH Agency Contacted: 1333 Representative spoke with at [facility] Agency: Marylene Land  Social Drivers of Health (SDOH) Interventions SDOH Screenings   Food Insecurity: No Food Insecurity (01/05/2024)  Housing: Low Risk  (01/05/2024)  Transportation Needs: No Transportation Needs (01/05/2024)  Utilities: Not At Risk (01/05/2024)  Social Connections: Patient Unable To Answer (01/05/2024)  Tobacco Use: Low Risk  (01/05/2024)     Readmission Risk Interventions    01/07/2024   10:49 AM  Readmission Risk Prevention Plan  Transportation Screening Complete  HRI or Home Care Consult Complete  Social Work Consult for Recovery Care Planning/Counseling Complete  Palliative Care Screening Not Applicable  Medication Review [employer]) Complete

## 2024-01-09 NOTE — Progress Notes (Signed)
Nephrology Follow-Up Consult note   Assessment/Recommendations: Charles Richard is a/an 89 y.o. male with a past medical history significant for osteoarthritis, atrial fibrillation, DM2, HTN, impaired hearing, CVA, HLD, nephrolithiasis, admitted for obstructive nephrolithiasis.       AKI on CKD 3B: Baseline creatinine around 2.5 per daughter.  Creatinine improved from 6.6 down to 5.7 today.  Left ureteral stent placed on 1/25.  Urine output and creatinine improving -Continue to monitor daily Cr, Dose meds for GFR -Monitor Daily I/Os, Daily weight  -Maintain MAP>65 for optimal renal perfusion.  -Avoid nephrotoxic medications including NSAIDs -Use synthetic opioids (Fentanyl/Dilaudid) if needed -Currently no indication for HD -Encourage oral hydration -Stable for discharge from nephrology perspective.  We will sign off at this time.  Follow-up with PCP at the [facility]  Hyperkalemia: Has improved with relief of obstruction  Obstruction: Status post stent placement.  Antibiotics per primary team  Hypertension: Continue home medications  ICU delirium: Management per primary team.  Overall improved  Recommendations conveyed to primary service.    Darnell Level [facility] 01/09/2024 12:11 PM  ___________________________________________________________  CC: AKI  Interval History/Subjective: Patient feels well with no complaints.  Creatinine improving.  Continues to have robust urine output.   Medications:  Current Facility-Administered Medications  Medication Dose Route Frequency Provider Last Rate Last Admin   acetaminophen (TYLENOL) tablet 650 mg  650 mg Oral Q6H PRN Bobette Mo, MD       Or   acetaminophen (TYLENOL) suppository 650 mg  650 mg Rectal Q6H PRN Bobette Mo, MD       albuterol (PROVENTIL) (2.5 MG/3ML) 0.083% nebulizer solution 2.5 mg  2.5 mg Nebulization Q6H PRN Bobette Mo, MD   2.5 mg at 01/08/24 1318   apixaban (ELIQUIS)  tablet 2.5 mg  2.5 mg Oral BID Kc, Dayna Barker, MD   2.5 mg at 01/09/24 0959   carvedilol (COREG) tablet 6.25 mg  6.25 mg Oral BID WC Kc, Dayna Barker, MD   6.25 mg at 01/09/24 0817   [START ON 01/10/2024] cefadroxil (DURICEF) capsule 500 mg  500 mg Oral Daily Kc, Dayna Barker, MD       Chlorhexidine Gluconate Cloth 2 % PADS 6 each  6 each Topical Daily Bobette Mo, MD   6 each at 01/09/24 1000   docusate sodium (COLACE) capsule 100 mg  100 mg Oral Daily Bobette Mo, MD   100 mg at 01/09/24 2440   gabapentin (NEURONTIN) capsule 300 mg  300 mg Oral QHS Bobette Mo, MD   300 mg at 01/08/24 2138   latanoprost (XALATAN) 0.005 % ophthalmic solution 1 drop  1 drop Both Eyes QHS Bobette Mo, MD   1 drop at 01/08/24 2138   loratadine (CLARITIN) tablet 10 mg  10 mg Oral Q breakfast Bobette Mo, MD   10 mg at 01/09/24 0817   ondansetron [facility]) tablet 4 mg  4 mg Oral Q6H PRN Bobette Mo, MD       Or   ondansetron [facility]) injection 4 mg  4 mg Intravenous Q6H PRN Bobette Mo, MD       Oral care mouth rinse  15 mL Mouth Rinse PRN Lanae Boast, MD       polyvinyl alcohol (LIQUIFILM TEARS) 1.4 % ophthalmic solution 1 drop  1 drop Both Eyes Daily Herby Abraham, RPH   1 drop at 01/09/24 1002   senna-docusate (Senokot-S) tablet 1 tablet  1 tablet Oral Daily Robb Matar,  Ernestene Kiel, MD   1 tablet at 01/09/24 0959   simvastatin (ZOCOR) tablet 10 mg  10 mg Oral Daily Bobette Mo, MD   10 mg at 01/09/24 0959   zolpidem (AMBIEN) tablet 10 mg  10 mg Oral QHS Kc, Dayna Barker, MD   10 mg at 01/08/24 2138      Review of Systems: 10 systems reviewed and negative except per interval history/subjective  Physical Exam: Vitals:   01/09/24 0700 01/09/24 0800  BP: 130/73 137/63  Pulse: 100 96  Resp: 17 (!) 26  Temp:  98.8 F (37.1 C)  SpO2: 94% 97%   No intake/output data recorded.  Intake/Output Summary (Last 24 hours) at 01/09/2024 1211 Last data filed at 01/09/2024  0605 Gross per 24 hour  Intake 96.73 ml  Output 2900 ml  Net -2803.27 ml   Constitutional: Elderly male, no distress ENMT: ears and nose without scars or lesions, MMM CV: normal rate, trace edema Respiratory: Lateral chest rise, normal work of breathing Gastrointestinal: soft, non-tender, no palpable masses or hernias Skin: no visible lesions or rashes Psych: Awake and alert, appropriate mood and affect   Test Results I personally reviewed new and old clinical labs and radiology tests Lab Results  Component Value Date   NA 140 01/09/2024   K 4.5 01/09/2024   CL 110 01/09/2024   CO2 17 (L) 01/09/2024   BUN 70 (H) 01/09/2024   CREATININE 4.54 (H) 01/09/2024   CALCIUM 7.8 (L) 01/09/2024   ALBUMIN 2.2 (L) 01/08/2024   PHOS 5.3 (H) 01/08/2024    CBC Recent Labs  Lab 01/05/24 0817 01/05/24 0834 01/07/24 0241 01/08/24 0248 01/09/24 0240  WBC 5.6   < > 12.7* 9.6 9.3  NEUTROABS 4.2  --   --   --   --   HGB 3.7*   < > 10.2* 9.4* 9.3*  HCT 13.1*   < > 31.7* 29.6* 29.9*  MCV 112.0*   < > 98.1 99.7 103.1*  PLT 84*   < > 253 255 236   < > = values in this interval not displayed.

## 2024-01-10 LAB — CULTURE, BLOOD (ROUTINE X 2)

## 2024-01-18 ENCOUNTER — Other Ambulatory Visit: Payer: Self-pay | Admitting: Urology

## 2024-02-07 NOTE — Progress Notes (Signed)
 COVID Vaccine received:  [x]  No []  Yes Date of any COVID positive Test in last 90 days:  none  PCP - [facility]  Pearson Grippe, MD Cardiologist - Bryan Lemma  [facility] 06-04-2017    Chest x-ray - 01-05-2024 EKG 01-07-2024   Stress Test -  ECHO - 07-31-2021  Epic Cardiac Cath -   PCR screen: []  Ordered & Completed []   No Order but Needs PROFEND     [x]   N/A for this surgery  Surgery Plan:  [x]  Ambulatory   []  Outpatient in bed  []  Admit Anesthesia:    [x]  General  []  Spinal  []   Choice []   MAC  Bowel Prep - [x]  No  []   Yes ______  Pacemaker / ICD device [x]  No []  Yes   Spinal Cord Stimulator:[x]  No []  Yes       History of Sleep Apnea? [x]  No []  Yes   CPAP used?- [x]  No []  Yes    Does the patient monitor blood sugar?   []  N/A   []  No [x]  Yes  Patient has: []  NO Hx DM   []  Pre-DM   []  DM1  [x]   DM2 Last A1c was:   6.5 on  01-05-2024     Does patient have a [employer] or Dexcom? [x]  No []  Yes   Fasting Blood Sugar Ranges-  Checks Blood Sugar _2 times a week.  Diabetic medications/ instructions: Sitagliptin (Zituvio)  Hold DOS  Blood Thinner / Instructions:  ELIQUIS   Hold x 2 days verbal per Loney Laurence on 02-08-24 Aspirin Instructions:  none  ERAS Protocol Ordered: [x]  No  []  Yes Patient is to be NPO after:  Midnight Prior  Dental hx: []  Dentures:  []  N/A      []  Bridge or Partial:                   [x]  Loose or Damaged teeth:   Comments: Patient was seen with his daughter, [name], who is his POA. Mr. Tinnel gave me permission to let [name] sign his consent. She voiced understanding and agreement to all of his surgery instructions.   Activity level: Patient is able / unable to climb a flight of stairs without difficulty; []  No CP  []  No SOB, but would have ___   Patient can / can not perform ADLs without assistance.   Anesthesia review: DM2, HTN, A.fib, CKD3, Hx CVA 2010, AAA, HOH-right ear HA  Patient denies shortness of breath, fever, cough and chest pain at PAT  appointment.  Patient verbalized understanding and agreement to the Pre-Surgical Instructions that were given to them at this PAT appointment. Patient was also educated of the need to review these PAT instructions again prior to his surgery.I reviewed the appropriate phone numbers to call if they have any and questions or concerns.

## 2024-02-07 NOTE — Patient Instructions (Signed)
 SURGICAL WAITING ROOM VISITATION Patients having surgery or a procedure may have no more than 2 support people in the waiting area - these visitors may rotate in the visitor waiting room.   Due to an increase in RSV and influenza rates and associated hospitalizations, children ages 26 and under may not visit patients in [facility] hospitals. If the patient needs to stay at the hospital during part of their recovery, the visitor guidelines for inpatient rooms apply.  PRE-OP VISITATION  Pre-op nurse will coordinate an appropriate time for 1 support person to accompany the patient in pre-op.  This support person may not rotate.  This visitor will be contacted when the time is appropriate for the visitor to come back in the pre-op area.  Please refer to the [facility] website for the visitor guidelines for Inpatients (after your surgery is over and you are in a regular room).  You are not required to quarantine at this time prior to your surgery. However, you must do this: Hand Hygiene often Do NOT share personal items Notify your provider if you are in close contact with someone who has COVID or you develop fever 100.4 or greater, new onset of sneezing, cough, sore throat, shortness of breath or body aches.  If you test positive for Covid or have been in contact with anyone that has tested positive in the last 10 days please notify you surgeon.    Your procedure is scheduled on:  Friday  February 15, 2024  Report to [facility] Main Entrance: Leota Jacobsen entrance where the [employer] is available.   Report to admitting at:  06:45   AM  Call this number if you have any questions or problems the morning of surgery [phone]  DO NOT EAT OR DRINK ANYTHING AFTER MIDNIGHT THE NIGHT PRIOR TO YOUR SURGERY / PROCEDURE.   FOLLOW  ANY ADDITIONAL PRE OP INSTRUCTIONS YOU RECEIVED FROM YOUR SURGEON'S OFFICE!!!   Oral Hygiene is also important to reduce your risk of infection.        Remember -  BRUSH YOUR TEETH THE MORNING OF SURGERY WITH YOUR REGULAR TOOTHPASTE  Do NOT smoke after Midnight the night before surgery.  STOP TAKING all Vitamins, Herbs and supplements 1 week before your surgery.   Take ONLY these medicines the morning of surgery with A SIP OF WATER: carvedilol, loratadine, and Tramadol if needed for pain. You may use your eye drops if needed.   ELIQUIS- Last dose will be on Tuesday 02-12-24.                   You may not have any metal on your body including  jewelry, and body piercing  Do not wear  lotions, powders, cologne, or deodorant  Men may shave face and neck.  Contacts, Hearing Aids, dentures or bridgework may not be worn into surgery. DENTURES WILL BE REMOVED PRIOR TO SURGERY PLEASE DO NOT APPLY "Poly grip" OR ADHESIVES!!!  Patients discharged on the day of surgery will not be allowed to drive home.  Someone NEEDS to stay with you for the first 24 hours after anesthesia.  Do not bring your home medications to the hospital. The Pharmacy will dispense medications listed on your medication list to you during your admission in the Hospital.  Special Instructions: Bring a copy of your healthcare power of attorney and living will documents the day of surgery, if you wish to have them scanned into your [facility] Medical Records- EPIC  Please  read over the following fact sheets you were given: IF YOU HAVE QUESTIONS ABOUT YOUR PRE-OP INSTRUCTIONS, PLEASE CALL [phone].   [facility] - Preparing for Surgery Before surgery, you can play an important role.  Because skin is not sterile, your skin needs to be as free of germs as possible.  You can reduce the number of germs on your skin by washing with CHG (chlorahexidine gluconate) soap before surgery.  CHG is an antiseptic cleaner which kills germs and bonds with the skin to continue killing germs even after washing. Please DO NOT use if you have an allergy to CHG or antibacterial soaps.  If your skin becomes  reddened/irritated stop using the CHG and inform your nurse when you arrive at Short Stay. Do not shave (including legs and underarms) for at least 48 hours prior to the first CHG shower.  You may shave your face/neck.  Please follow these instructions carefully:  1.  Shower with CHG Soap the night before surgery and the  morning of surgery.  2.  If you choose to wash your hair, wash your hair first as usual with your normal  shampoo.  3.  After you shampoo, rinse your hair and body thoroughly to remove the shampoo.                             4.  Use CHG as you would any other liquid soap.  You can apply chg directly to the skin and wash.  Gently with a scrungie or clean washcloth.  5.  Apply the CHG Soap to your body ONLY FROM THE NECK DOWN.   Do not use on face/ open                           Wound or open sores. Avoid contact with eyes, ears mouth and genitals (private parts).                       Wash face,  Genitals (private parts) with your normal soap.             6.  Wash thoroughly, paying special attention to the area where your  surgery  will be performed.  7.  Thoroughly rinse your body with warm water from the neck down.  8.  DO NOT shower/wash with your normal soap after using and rinsing off the CHG Soap.            9.  Pat yourself dry with a clean towel.            10.  Wear clean pajamas.            11.  Place clean sheets on your bed the night of your first shower and do not  sleep with pets.  ON THE DAY OF SURGERY : Do not apply any lotions/deodorants the morning of surgery.  Please wear clean clothes to the hospital/surgery center.    FAILURE TO FOLLOW THESE INSTRUCTIONS MAY RESULT IN THE CANCELLATION OF YOUR SURGERY  PATIENT SIGNATURE_________________________________  NURSE SIGNATURE__________________________________  ________________________________________________________________________

## 2024-02-08 ENCOUNTER — Other Ambulatory Visit: Payer: Self-pay

## 2024-02-08 ENCOUNTER — Encounter (HOSPITAL_COMMUNITY): Payer: Self-pay

## 2024-02-08 ENCOUNTER — Encounter (HOSPITAL_COMMUNITY)
Admission: RE | Admit: 2024-02-08 | Discharge: 2024-02-08 | Disposition: A | Discharge status: Home / Self Care | Payer: No Typology Code available for payment source | Source: Ambulatory Visit | Attending: Urology | Admitting: Urology

## 2024-02-08 VITALS — BP 111/71 | HR 96 | Temp 99.3°F | Resp 12 | Ht 72.0 in | Wt 229.0 lb

## 2024-02-08 DIAGNOSIS — Z01812 Encounter for preprocedural laboratory examination: Secondary | ICD-10-CM | POA: Insufficient documentation

## 2024-02-08 DIAGNOSIS — I10 Essential (primary) hypertension: Secondary | ICD-10-CM | POA: Diagnosis not present

## 2024-02-08 DIAGNOSIS — E119 Type 2 diabetes mellitus without complications: Secondary | ICD-10-CM | POA: Insufficient documentation

## 2024-02-08 DIAGNOSIS — N390 Urinary tract infection, site not specified: Secondary | ICD-10-CM

## 2024-02-08 DIAGNOSIS — Z01818 Encounter for other preprocedural examination: Secondary | ICD-10-CM

## 2024-02-08 HISTORY — DX: Unspecified dementia, unspecified severity, without behavioral disturbance, psychotic disturbance, mood disturbance, and anxiety: F03.90

## 2024-02-08 HISTORY — DX: Malignant (primary) neoplasm, unspecified: C80.1

## 2024-02-08 LAB — CBC
HCT: 32.7 % — ABNORMAL LOW (ref 39.0–52.0)
Hemoglobin: 10.1 g/dL — ABNORMAL LOW (ref 13.0–17.0)
MCH: 30.7 pg (ref 26.0–34.0)
MCHC: 30.9 g/dL (ref 30.0–36.0)
MCV: 99.4 fL (ref 80.0–100.0)
Platelets: 329 10*3/uL (ref 150–400)
RBC: 3.29 MIL/uL — ABNORMAL LOW (ref 4.22–5.81)
RDW: 11.9 % (ref 11.5–15.5)
WBC: 11.8 10*3/uL — ABNORMAL HIGH (ref 4.0–10.5)
nRBC: 0 % (ref 0.0–0.2)

## 2024-02-08 LAB — BASIC METABOLIC PANEL
Anion gap: 11 (ref 5–15)
BUN: 68 mg/dL — ABNORMAL HIGH (ref 8–23)
CO2: 17 mmol/L — ABNORMAL LOW (ref 22–32)
Calcium: 8.8 mg/dL — ABNORMAL LOW (ref 8.9–10.3)
Chloride: 107 mmol/L (ref 98–111)
Creatinine, Ser: 4.2 mg/dL — ABNORMAL HIGH (ref 0.61–1.24)
GFR, Estimated: 13 mL/min — ABNORMAL LOW (ref >60)
Glucose, Bld: 126 mg/dL — ABNORMAL HIGH (ref 70–99)
Potassium: 5.9 mmol/L — ABNORMAL HIGH (ref 3.5–5.1)
Sodium: 135 mmol/L (ref 135–145)

## 2024-02-08 LAB — GLUCOSE, CAPILLARY: Glucose-Capillary: 96 mg/dL (ref 70–99)

## 2024-02-09 ENCOUNTER — Emergency Department (HOSPITAL_COMMUNITY)

## 2024-02-09 ENCOUNTER — Inpatient Hospital Stay (HOSPITAL_COMMUNITY)
Admission: EM | Admit: 2024-02-09 | Discharge: 2024-02-20 | DRG: 659 | Disposition: A | Discharge status: Home Health | Attending: Student in an Organized Health Care Education/Training Program | Admitting: Student in an Organized Health Care Education/Training Program

## 2024-02-09 ENCOUNTER — Other Ambulatory Visit: Payer: Self-pay

## 2024-02-09 ENCOUNTER — Encounter (HOSPITAL_COMMUNITY): Payer: Self-pay

## 2024-02-09 DIAGNOSIS — E872 Acidosis, unspecified: Secondary | ICD-10-CM

## 2024-02-09 DIAGNOSIS — F0390 Unspecified dementia without behavioral disturbance: Secondary | ICD-10-CM | POA: Diagnosis present

## 2024-02-09 DIAGNOSIS — E871 Hypo-osmolality and hyponatremia: Secondary | ICD-10-CM | POA: Diagnosis present

## 2024-02-09 DIAGNOSIS — R7401 Elevation of levels of liver transaminase levels: Secondary | ICD-10-CM | POA: Diagnosis present

## 2024-02-09 DIAGNOSIS — D631 Anemia in chronic kidney disease: Secondary | ICD-10-CM | POA: Diagnosis present

## 2024-02-09 DIAGNOSIS — Z7984 Long term (current) use of oral hypoglycemic drugs: Secondary | ICD-10-CM

## 2024-02-09 DIAGNOSIS — E785 Hyperlipidemia, unspecified: Secondary | ICD-10-CM | POA: Diagnosis present

## 2024-02-09 DIAGNOSIS — T83518A Infection and inflammatory reaction due to other urinary catheter, initial encounter: Principal | ICD-10-CM | POA: Diagnosis present

## 2024-02-09 DIAGNOSIS — I129 Hypertensive chronic kidney disease with stage 1 through stage 4 chronic kidney disease, or unspecified chronic kidney disease: Secondary | ICD-10-CM | POA: Diagnosis present

## 2024-02-09 DIAGNOSIS — A419 Sepsis, unspecified organism: Secondary | ICD-10-CM | POA: Diagnosis not present

## 2024-02-09 DIAGNOSIS — Z515 Encounter for palliative care: Secondary | ICD-10-CM

## 2024-02-09 DIAGNOSIS — E1122 Type 2 diabetes mellitus with diabetic chronic kidney disease: Secondary | ICD-10-CM | POA: Diagnosis present

## 2024-02-09 DIAGNOSIS — R6520 Severe sepsis without septic shock: Principal | ICD-10-CM

## 2024-02-09 DIAGNOSIS — Z87442 Personal history of urinary calculi: Secondary | ICD-10-CM

## 2024-02-09 DIAGNOSIS — H9190 Unspecified hearing loss, unspecified ear: Secondary | ICD-10-CM | POA: Diagnosis present

## 2024-02-09 DIAGNOSIS — E875 Hyperkalemia: Secondary | ICD-10-CM | POA: Diagnosis present

## 2024-02-09 DIAGNOSIS — Z8673 Personal history of transient ischemic attack (TIA), and cerebral infarction without residual deficits: Secondary | ICD-10-CM

## 2024-02-09 DIAGNOSIS — N136 Pyonephrosis: Secondary | ICD-10-CM | POA: Diagnosis present

## 2024-02-09 DIAGNOSIS — Z85820 Personal history of malignant melanoma of skin: Secondary | ICD-10-CM

## 2024-02-09 DIAGNOSIS — N179 Acute kidney failure, unspecified: Secondary | ICD-10-CM

## 2024-02-09 DIAGNOSIS — Z8616 Personal history of COVID-19: Secondary | ICD-10-CM

## 2024-02-09 DIAGNOSIS — Z79899 Other long term (current) drug therapy: Secondary | ICD-10-CM

## 2024-02-09 DIAGNOSIS — Z96611 Presence of right artificial shoulder joint: Secondary | ICD-10-CM | POA: Diagnosis present

## 2024-02-09 DIAGNOSIS — N202 Calculus of kidney with calculus of ureter: Secondary | ICD-10-CM | POA: Diagnosis present

## 2024-02-09 DIAGNOSIS — Z66 Do not resuscitate: Secondary | ICD-10-CM | POA: Diagnosis present

## 2024-02-09 DIAGNOSIS — N1832 Chronic kidney disease, stage 3b: Secondary | ICD-10-CM | POA: Diagnosis present

## 2024-02-09 DIAGNOSIS — Z9049 Acquired absence of other specified parts of digestive tract: Secondary | ICD-10-CM

## 2024-02-09 DIAGNOSIS — E8722 Chronic metabolic acidosis: Secondary | ICD-10-CM | POA: Diagnosis present

## 2024-02-09 DIAGNOSIS — N132 Hydronephrosis with renal and ureteral calculous obstruction: Secondary | ICD-10-CM

## 2024-02-09 DIAGNOSIS — N390 Urinary tract infection, site not specified: Secondary | ICD-10-CM | POA: Diagnosis present

## 2024-02-09 DIAGNOSIS — Z7901 Long term (current) use of anticoagulants: Secondary | ICD-10-CM

## 2024-02-09 DIAGNOSIS — Z801 Family history of malignant neoplasm of trachea, bronchus and lung: Secondary | ICD-10-CM

## 2024-02-09 DIAGNOSIS — I4821 Permanent atrial fibrillation: Secondary | ICD-10-CM | POA: Diagnosis present

## 2024-02-09 DIAGNOSIS — I252 Old myocardial infarction: Secondary | ICD-10-CM

## 2024-02-09 DIAGNOSIS — Y846 Urinary catheterization as the cause of abnormal reaction of the patient, or of later complication, without mention of misadventure at the time of the procedure: Secondary | ICD-10-CM | POA: Diagnosis present

## 2024-02-09 DIAGNOSIS — Z1152 Encounter for screening for COVID-19: Secondary | ICD-10-CM

## 2024-02-09 DIAGNOSIS — I714 Abdominal aortic aneurysm, without rupture, unspecified: Secondary | ICD-10-CM | POA: Diagnosis present

## 2024-02-09 DIAGNOSIS — E119 Type 2 diabetes mellitus without complications: Secondary | ICD-10-CM

## 2024-02-09 DIAGNOSIS — R053 Chronic cough: Secondary | ICD-10-CM | POA: Diagnosis present

## 2024-02-09 LAB — URINALYSIS, W/ REFLEX TO CULTURE (INFECTION SUSPECTED)
Bilirubin Urine: NEGATIVE
Glucose, UA: NEGATIVE mg/dL
Ketones, ur: NEGATIVE mg/dL
Nitrite: NEGATIVE
Protein, ur: 100 mg/dL — AB
RBC / HPF: >50 RBC/hpf (ref 0–5)
Specific Gravity, Urine: 1.01 (ref 1.005–1.030)
WBC, UA: >50 WBC/hpf (ref 0–5)
pH: 5 (ref 5.0–8.0)

## 2024-02-09 LAB — COMPREHENSIVE METABOLIC PANEL
ALT: 53 U/L — ABNORMAL HIGH (ref 0–44)
AST: 61 U/L — ABNORMAL HIGH (ref 15–41)
Albumin: 2.7 g/dL — ABNORMAL LOW (ref 3.5–5.0)
Alkaline Phosphatase: 46 U/L (ref 38–126)
Anion gap: 11 (ref 5–15)
BUN: 75 mg/dL — ABNORMAL HIGH (ref 8–23)
CO2: 17 mmol/L — ABNORMAL LOW (ref 22–32)
Calcium: 8.5 mg/dL — ABNORMAL LOW (ref 8.9–10.3)
Chloride: 103 mmol/L (ref 98–111)
Creatinine, Ser: 4.46 mg/dL — ABNORMAL HIGH (ref 0.61–1.24)
GFR, Estimated: 12 mL/min — ABNORMAL LOW (ref >60)
Glucose, Bld: 146 mg/dL — ABNORMAL HIGH (ref 70–99)
Potassium: 6 mmol/L — ABNORMAL HIGH (ref 3.5–5.1)
Sodium: 131 mmol/L — ABNORMAL LOW (ref 135–145)
Total Bilirubin: 0.3 mg/dL (ref 0.0–1.2)
Total Protein: 7.5 g/dL (ref 6.5–8.1)

## 2024-02-09 LAB — PROTIME-INR
INR: 1.3 — ABNORMAL HIGH (ref 0.8–1.2)
Prothrombin Time: 16.7 s — ABNORMAL HIGH (ref 11.4–15.2)

## 2024-02-09 LAB — CBC WITH DIFFERENTIAL/PLATELET
Abs Immature Granulocytes: 0.15 10*3/uL — ABNORMAL HIGH (ref 0.00–0.07)
Basophils Absolute: 0.1 10*3/uL (ref 0.0–0.1)
Basophils Relative: 1 %
Eosinophils Absolute: 0.5 10*3/uL (ref 0.0–0.5)
Eosinophils Relative: 4 %
HCT: 35.7 % — ABNORMAL LOW (ref 39.0–52.0)
Hemoglobin: 10.8 g/dL — ABNORMAL LOW (ref 13.0–17.0)
Immature Granulocytes: 1 %
Lymphocytes Relative: 6 %
Lymphs Abs: 0.7 10*3/uL (ref 0.7–4.0)
MCH: 30.9 pg (ref 26.0–34.0)
MCHC: 30.3 g/dL (ref 30.0–36.0)
MCV: 102 fL — ABNORMAL HIGH (ref 80.0–100.0)
Monocytes Absolute: 1.5 10*3/uL — ABNORMAL HIGH (ref 0.1–1.0)
Monocytes Relative: 12 %
Neutro Abs: 9.6 10*3/uL — ABNORMAL HIGH (ref 1.7–7.7)
Neutrophils Relative %: 76 %
Platelets: 369 10*3/uL (ref 150–400)
RBC: 3.5 MIL/uL — ABNORMAL LOW (ref 4.22–5.81)
RDW: 11.9 % (ref 11.5–15.5)
WBC: 12.6 10*3/uL — ABNORMAL HIGH (ref 4.0–10.5)
nRBC: 0 % (ref 0.0–0.2)

## 2024-02-09 LAB — I-STAT CG4 LACTIC ACID, ED: Lactic Acid, Venous: 2.1 mmol/L (ref 0.5–1.9)

## 2024-02-09 MED ORDER — SODIUM ZIRCONIUM CYCLOSILICATE 10 G PO PACK
10.0000 g | PACK | Freq: Once | ORAL | Status: AC
  Administered 2024-02-09: 10 g via ORAL
  Filled 2024-02-09: qty 1

## 2024-02-09 MED ORDER — CEFEPIME HCL 2 G IV SOLR
2.0000 g | Freq: Once | INTRAVENOUS | Status: AC
  Administered 2024-02-09: 2 g via INTRAVENOUS
  Filled 2024-02-09: qty 12.5

## 2024-02-09 MED ORDER — INSULIN ASPART 100 UNIT/ML IV SOLN
5.0000 [IU] | Freq: Once | INTRAVENOUS | Status: AC
  Administered 2024-02-09: 5 [IU] via INTRAVENOUS
  Filled 2024-02-09: qty 0.05

## 2024-02-09 MED ORDER — DEXTROSE 50 % IV SOLN
1.0000 | Freq: Once | INTRAVENOUS | Status: AC
  Administered 2024-02-09: 50 mL via INTRAVENOUS
  Filled 2024-02-09: qty 50

## 2024-02-09 MED ORDER — SODIUM BICARBONATE 8.4 % IV SOLN
50.0000 meq | Freq: Once | INTRAVENOUS | Status: AC
  Administered 2024-02-09: 50 meq via INTRAVENOUS
  Filled 2024-02-09: qty 50

## 2024-02-09 MED ORDER — CALCIUM GLUCONATE-NACL 1-0.675 GM/50ML-% IV SOLN
1.0000 g | Freq: Once | INTRAVENOUS | Status: AC
  Administered 2024-02-09: 1000 mg via INTRAVENOUS
  Filled 2024-02-09: qty 50

## 2024-02-09 NOTE — ED Triage Notes (Addendum)
 Pt arrived via EMS from home w/ c/o of fever. Catheter placed after stent placement due to kidney stone on Jan.24th. Has not been replaced or had foley care since. Pt reports dysuria and a cough.  500mg  of tylenol on scene Hr 98-120 RR 26-30 T 101 Bp 140/90 CBG 176

## 2024-02-10 DIAGNOSIS — Y846 Urinary catheterization as the cause of abnormal reaction of the patient, or of later complication, without mention of misadventure at the time of the procedure: Secondary | ICD-10-CM | POA: Diagnosis present

## 2024-02-10 DIAGNOSIS — Z7901 Long term (current) use of anticoagulants: Secondary | ICD-10-CM | POA: Diagnosis not present

## 2024-02-10 DIAGNOSIS — I129 Hypertensive chronic kidney disease with stage 1 through stage 4 chronic kidney disease, or unspecified chronic kidney disease: Secondary | ICD-10-CM | POA: Diagnosis present

## 2024-02-10 DIAGNOSIS — I252 Old myocardial infarction: Secondary | ICD-10-CM | POA: Diagnosis not present

## 2024-02-10 DIAGNOSIS — N390 Urinary tract infection, site not specified: Secondary | ICD-10-CM | POA: Diagnosis not present

## 2024-02-10 DIAGNOSIS — Z8616 Personal history of COVID-19: Secondary | ICD-10-CM | POA: Diagnosis not present

## 2024-02-10 DIAGNOSIS — N179 Acute kidney failure, unspecified: Secondary | ICD-10-CM | POA: Diagnosis present

## 2024-02-10 DIAGNOSIS — Z515 Encounter for palliative care: Secondary | ICD-10-CM | POA: Diagnosis not present

## 2024-02-10 DIAGNOSIS — Z1152 Encounter for screening for COVID-19: Secondary | ICD-10-CM | POA: Diagnosis not present

## 2024-02-10 DIAGNOSIS — E8722 Chronic metabolic acidosis: Secondary | ICD-10-CM | POA: Diagnosis present

## 2024-02-10 DIAGNOSIS — T83518A Infection and inflammatory reaction due to other urinary catheter, initial encounter: Secondary | ICD-10-CM | POA: Diagnosis present

## 2024-02-10 DIAGNOSIS — E1122 Type 2 diabetes mellitus with diabetic chronic kidney disease: Secondary | ICD-10-CM | POA: Diagnosis present

## 2024-02-10 DIAGNOSIS — I2510 Atherosclerotic heart disease of native coronary artery without angina pectoris: Secondary | ICD-10-CM | POA: Diagnosis not present

## 2024-02-10 DIAGNOSIS — E875 Hyperkalemia: Secondary | ICD-10-CM | POA: Diagnosis present

## 2024-02-10 DIAGNOSIS — I714 Abdominal aortic aneurysm, without rupture, unspecified: Secondary | ICD-10-CM | POA: Diagnosis present

## 2024-02-10 DIAGNOSIS — N136 Pyonephrosis: Secondary | ICD-10-CM | POA: Diagnosis present

## 2024-02-10 DIAGNOSIS — N202 Calculus of kidney with calculus of ureter: Secondary | ICD-10-CM | POA: Diagnosis present

## 2024-02-10 DIAGNOSIS — A419 Sepsis, unspecified organism: Secondary | ICD-10-CM | POA: Diagnosis present

## 2024-02-10 DIAGNOSIS — D631 Anemia in chronic kidney disease: Secondary | ICD-10-CM | POA: Diagnosis present

## 2024-02-10 DIAGNOSIS — E872 Acidosis, unspecified: Secondary | ICD-10-CM

## 2024-02-10 DIAGNOSIS — E871 Hypo-osmolality and hyponatremia: Secondary | ICD-10-CM | POA: Diagnosis present

## 2024-02-10 DIAGNOSIS — E785 Hyperlipidemia, unspecified: Secondary | ICD-10-CM | POA: Diagnosis present

## 2024-02-10 DIAGNOSIS — I4821 Permanent atrial fibrillation: Secondary | ICD-10-CM | POA: Diagnosis present

## 2024-02-10 DIAGNOSIS — Z66 Do not resuscitate: Secondary | ICD-10-CM | POA: Diagnosis present

## 2024-02-10 DIAGNOSIS — N1832 Chronic kidney disease, stage 3b: Secondary | ICD-10-CM | POA: Diagnosis present

## 2024-02-10 DIAGNOSIS — R6520 Severe sepsis without septic shock: Secondary | ICD-10-CM | POA: Diagnosis present

## 2024-02-10 DIAGNOSIS — I4891 Unspecified atrial fibrillation: Secondary | ICD-10-CM | POA: Diagnosis not present

## 2024-02-10 DIAGNOSIS — N132 Hydronephrosis with renal and ureteral calculous obstruction: Secondary | ICD-10-CM | POA: Diagnosis not present

## 2024-02-10 DIAGNOSIS — E119 Type 2 diabetes mellitus without complications: Secondary | ICD-10-CM | POA: Diagnosis not present

## 2024-02-10 DIAGNOSIS — F0390 Unspecified dementia without behavioral disturbance: Secondary | ICD-10-CM | POA: Diagnosis present

## 2024-02-10 LAB — COMPREHENSIVE METABOLIC PANEL
ALT: 43 U/L (ref 0–44)
AST: 38 U/L (ref 15–41)
Albumin: 2.2 g/dL — ABNORMAL LOW (ref 3.5–5.0)
Alkaline Phosphatase: 34 U/L — ABNORMAL LOW (ref 38–126)
Anion gap: 10 (ref 5–15)
BUN: 72 mg/dL — ABNORMAL HIGH (ref 8–23)
CO2: 17 mmol/L — ABNORMAL LOW (ref 22–32)
Calcium: 8.4 mg/dL — ABNORMAL LOW (ref 8.9–10.3)
Chloride: 107 mmol/L (ref 98–111)
Creatinine, Ser: 4.49 mg/dL — ABNORMAL HIGH (ref 0.61–1.24)
GFR, Estimated: 12 mL/min — ABNORMAL LOW (ref >60)
Glucose, Bld: 111 mg/dL — ABNORMAL HIGH (ref 70–99)
Potassium: 5.4 mmol/L — ABNORMAL HIGH (ref 3.5–5.1)
Sodium: 134 mmol/L — ABNORMAL LOW (ref 135–145)
Total Bilirubin: 0.6 mg/dL (ref 0.0–1.2)
Total Protein: 6.3 g/dL — ABNORMAL LOW (ref 6.5–8.1)

## 2024-02-10 LAB — URINALYSIS, W/ REFLEX TO CULTURE (INFECTION SUSPECTED)
Bilirubin Urine: NEGATIVE
Glucose, UA: 50 mg/dL — AB
Ketones, ur: NEGATIVE mg/dL
Nitrite: NEGATIVE
Protein, ur: 100 mg/dL — AB
RBC / HPF: >50 RBC/hpf (ref 0–5)
Specific Gravity, Urine: 1.009 (ref 1.005–1.030)
WBC, UA: >50 WBC/hpf (ref 0–5)
pH: 5 (ref 5.0–8.0)

## 2024-02-10 LAB — CBC
HCT: 29.5 % — ABNORMAL LOW (ref 39.0–52.0)
Hemoglobin: 9.4 g/dL — ABNORMAL LOW (ref 13.0–17.0)
MCH: 31.8 pg (ref 26.0–34.0)
MCHC: 31.9 g/dL (ref 30.0–36.0)
MCV: 99.7 fL (ref 80.0–100.0)
Platelets: 320 10*3/uL (ref 150–400)
RBC: 2.96 MIL/uL — ABNORMAL LOW (ref 4.22–5.81)
RDW: 11.9 % (ref 11.5–15.5)
WBC: 10.3 10*3/uL (ref 4.0–10.5)
nRBC: 0 % (ref 0.0–0.2)

## 2024-02-10 LAB — RESP PANEL BY RT-PCR (RSV, FLU A&B, COVID)  RVPGX2
Influenza A by PCR: NEGATIVE
Influenza B by PCR: NEGATIVE
Resp Syncytial Virus by PCR: NEGATIVE
SARS Coronavirus 2 by RT PCR: NEGATIVE

## 2024-02-10 LAB — GLUCOSE, CAPILLARY
Glucose-Capillary: 89 mg/dL (ref 70–99)
Glucose-Capillary: 166 mg/dL — ABNORMAL HIGH (ref 70–99)
Glucose-Capillary: 197 mg/dL — ABNORMAL HIGH (ref 70–99)
Glucose-Capillary: 128 mg/dL — ABNORMAL HIGH (ref 70–99)

## 2024-02-10 LAB — I-STAT CG4 LACTIC ACID, ED: Lactic Acid, Venous: 0.6 mmol/L (ref 0.5–1.9)

## 2024-02-10 LAB — CBG MONITORING, ED: Glucose-Capillary: 93 mg/dL (ref 70–99)

## 2024-02-10 MED ORDER — TRAZODONE HCL 50 MG PO TABS: 50.0000 mg | ORAL_TABLET | Freq: Every evening | ORAL | Status: DC | PRN

## 2024-02-10 MED ORDER — SODIUM ZIRCONIUM CYCLOSILICATE 10 G PO PACK
10.0000 g | PACK | Freq: Once | ORAL | Status: AC
  Administered 2024-02-10: 10 g via ORAL
  Filled 2024-02-10: qty 1

## 2024-02-10 MED ORDER — LORATADINE 10 MG PO TABS
10.0000 mg | ORAL_TABLET | ORAL | Status: DC
  Administered 2024-02-10 – 2024-02-19 (×5): 10 mg via ORAL
  Filled 2024-02-10 (×4): qty 1

## 2024-02-10 MED ORDER — DOCUSATE SODIUM 100 MG PO CAPS
100.0000 mg | ORAL_CAPSULE | Freq: Every day | ORAL | Status: DC
  Administered 2024-02-11 – 2024-02-16 (×6): 100 mg via ORAL
  Filled 2024-02-10 (×8): qty 1

## 2024-02-10 MED ORDER — GUAIFENESIN 100 MG/5ML PO LIQD
5.0000 mL | ORAL | Status: DC | PRN
  Administered 2024-02-19: 5 mL via ORAL
  Filled 2024-02-10: qty 10

## 2024-02-10 MED ORDER — SODIUM CHLORIDE 0.9 % IV BOLUS
1000.0000 mL | Freq: Once | INTRAVENOUS | Status: AC
  Administered 2024-02-10: 1000 mL via INTRAVENOUS

## 2024-02-10 MED ORDER — MECLIZINE HCL 25 MG PO TABS: 25.0000 mg | ORAL_TABLET | Freq: Every day | ORAL | Status: DC

## 2024-02-10 MED ORDER — GABAPENTIN 300 MG PO CAPS
300.0000 mg | ORAL_CAPSULE | Freq: Every day | ORAL | Status: DC
  Administered 2024-02-10 – 2024-02-11 (×2): 300 mg via ORAL
  Filled 2024-02-10 (×2): qty 1

## 2024-02-10 MED ORDER — HYDRALAZINE HCL 20 MG/ML IJ SOLN: 10.0000 mg | Freq: Four times a day (QID) | INTRAMUSCULAR | Status: DC | PRN

## 2024-02-10 MED ORDER — LATANOPROST 0.005 % OP SOLN
1.0000 [drp] | Freq: Every day | OPHTHALMIC | Status: DC
  Administered 2024-02-10 – 2024-02-19 (×10): 1 [drp] via OPHTHALMIC
  Filled 2024-02-10: qty 2.5

## 2024-02-10 MED ORDER — SODIUM CHLORIDE 0.9 % IV SOLN
2.0000 g | INTRAVENOUS | Status: DC
  Administered 2024-02-10 – 2024-02-11 (×2): 2 g via INTRAVENOUS
  Filled 2024-02-10 (×2): qty 12.5

## 2024-02-10 MED ORDER — ZOLPIDEM TARTRATE 5 MG PO TABS: 5.0000 mg | ORAL_TABLET | Freq: Every evening | ORAL | Status: DC | PRN

## 2024-02-10 MED ORDER — ACETAMINOPHEN 650 MG RE SUPP
650.0000 mg | Freq: Four times a day (QID) | RECTAL | Status: AC | PRN
Start: 2024-02-10 — End: ?

## 2024-02-10 MED ORDER — APIXABAN 2.5 MG PO TABS
2.5000 mg | ORAL_TABLET | Freq: Two times a day (BID) | ORAL | Status: DC
  Administered 2024-02-10 – 2024-02-17 (×15): 2.5 mg via ORAL
  Filled 2024-02-10 (×15): qty 1

## 2024-02-10 MED ORDER — ONDANSETRON HCL 4 MG/2ML IJ SOLN: 4.0000 mg | Freq: Four times a day (QID) | INTRAMUSCULAR | Status: DC | PRN

## 2024-02-10 MED ORDER — SIMVASTATIN 10 MG PO TABS
10.0000 mg | ORAL_TABLET | Freq: Every day | ORAL | Status: AC
Start: 2024-02-10 — End: ?
  Administered 2024-02-10 – 2024-02-20 (×10): 10 mg via ORAL
  Filled 2024-02-10 (×10): qty 1

## 2024-02-10 MED ORDER — METOPROLOL TARTRATE 5 MG/5ML IV SOLN
5.0000 mg | INTRAVENOUS | Status: DC | PRN
  Administered 2024-02-11: 5 mg via INTRAVENOUS
  Filled 2024-02-10: qty 5

## 2024-02-10 MED ORDER — ACETAMINOPHEN 325 MG PO TABS
650.0000 mg | ORAL_TABLET | Freq: Four times a day (QID) | ORAL | Status: DC | PRN
  Administered 2024-02-11 – 2024-02-13 (×3): 650 mg via ORAL
  Filled 2024-02-10 (×3): qty 2

## 2024-02-10 MED ORDER — SENNOSIDES-DOCUSATE SODIUM 8.6-50 MG PO TABS: 1.0000 | ORAL_TABLET | Freq: Every evening | ORAL | Status: DC | PRN

## 2024-02-10 MED ORDER — INSULIN ASPART 100 UNIT/ML IJ SOLN
0.0000 [IU] | Freq: Three times a day (TID) | INTRAMUSCULAR | Status: DC
Start: 2024-02-10 — End: 2024-02-18
  Administered 2024-02-10 – 2024-02-11 (×3): 2 [IU] via SUBCUTANEOUS
  Administered 2024-02-11 – 2024-02-12 (×2): 1 [IU] via SUBCUTANEOUS
  Administered 2024-02-12 – 2024-02-13 (×2): 2 [IU] via SUBCUTANEOUS
  Administered 2024-02-13: 1 [IU] via SUBCUTANEOUS
  Administered 2024-02-14: 2 [IU] via SUBCUTANEOUS
  Administered 2024-02-14: 1 [IU] via SUBCUTANEOUS
  Administered 2024-02-14: 3 [IU] via SUBCUTANEOUS
  Administered 2024-02-15: 1 [IU] via SUBCUTANEOUS
  Administered 2024-02-15 – 2024-02-17 (×3): 2 [IU] via SUBCUTANEOUS
  Administered 2024-02-17 – 2024-02-18 (×4): 1 [IU] via SUBCUTANEOUS
  Filled 2024-02-10: qty 0.09

## 2024-02-10 MED ORDER — CHLORHEXIDINE GLUCONATE CLOTH 2 % EX PADS
6.0000 | MEDICATED_PAD | Freq: Every day | CUTANEOUS | Status: DC
  Administered 2024-02-10 – 2024-02-19 (×10): 6 via TOPICAL

## 2024-02-10 MED ORDER — INSULIN ASPART 100 UNIT/ML IJ SOLN
0.0000 [IU] | Freq: Every day | INTRAMUSCULAR | Status: DC
  Filled 2024-02-10: qty 0.05

## 2024-02-10 NOTE — H&P (Signed)
 History and Physical    Charles Richard RUE:[id number] DOB: Jun 28, 1930 DOA: 02/09/2024  PCP: [facility]  Patient coming from: Home  Chief Complaint: Fever  HPI: Charles Richard is a 89 y.o. male with medical history significant of dementia, AAA, permanent A-fib on Eliquis, type 2 diabetes, hypertension, impaired hearing, history of CVA, hyperlipidemia, CKD stage IIIb.  He was admitted 01/05/2024-01/09/2024 for sepsis due to obstructing ureteral calculus and underwent emergent cystoscopy with left ureteral stent placement.  Urology was planning on left ureteroscopic stone extraction in 2 to 3 weeks as outpatient.  He was discharged on 10-day course of oral antibiotics and had a Foley catheter in place.  Patient has not had follow-up with urology since his hospital discharge and presents to the ED tonight via EMS with complaints of fever, dysuria, and cough.  Vital signs on arrival to the ED: Temperature 102.4 F, pulse 97, respiratory rate 19, blood pressure 126/77, and SpO2 99% on room air.  Labs showing WBC count 12.6, hemoglobin 10.8 (stable), sodium 131, potassium 6.0, bicarb 17, anion gap 11, glucose 146, BUN 75, creatinine 4.4 (stable compared to labs a month ago but creatinine was previously 1.3-1.5 in 2022), AST 61, ALT 53, alk phos and T. bili normal, lactic acid 2.1, COVID/influenza/RSV PCR negative.  UA with moderate amount of leukocytes and microscopy showing >50 RBCs, >50 WBCs, and many bacteria.  Urine culture pending.  Blood cultures collected.  Chest x-ray showing no active cardiopulmonary disease.  Abdominal x-ray was also done which showed left ureteral stent in place and no radiopaque urinary calculi.  ED physician discussed the case with urologist Dr. Apolinar Junes who felt that since the patient's ureteral stent was in the expected area, no urgent urologic intervention needed at this time.  Recommended treating UTI with antibiotics and outpatient follow-up. Patient was given  calcium gluconate, insulin, sodium bicarb, and Lokelma for hyperkalemia.  He was also given cefepime for UTI and Foley catheter was replaced.  [facility] called to admit.  Patient has dementia and is hard of hearing.  History provided by his daughter at bedside who states patient had a follow-up visit with urology about 1.5 weeks ago and had his Foley catheter exchanged.  He did pass a stone in his urine at that time.  Daughter states patient's urine was appearing cloudy and urology had done a urine culture and told her that it was not showing significant bacterial growth.  They did prescribe Keflex on Friday and patient has received 2 doses so far.  Yesterday he appeared more lethargic and in the evening had fever of 102 F and chills.  Patient has not complained of any abdominal or flank pain.  No vomiting reported.  He has procedure planned with urology on March 7 to remove his ureteral stent.  Daughter states patient has chronic mild cough, no change from baseline.  Review of Systems:  Review of Systems  All other systems reviewed and are negative.   Past Medical History:  Diagnosis Date   AAA (abdominal aortic aneurysm) without rupture (HCC) 06/03/2017   Arthritis    Atrial fibrillation with rapid ventricular response (HCC) 04/2017   New diagnosis during admission for urosepsis secondary to left nephrolithiasis/ureteral stone with hydronephrosis   Cancer (HCC)    melanoma excised from top of head   COVID-19    04/2020   Dementia (HCC)    Diabetes mellitus without complication (HCC)    Dysrhythmia    a fib   Essential hypertension  Hard of hearing    History of CVA (cerebrovascular accident) without residual deficits 05/2009   Admitted with expressive aphasia & confusion following a neurocardiogenic syncope event (related to dehydration, AKD & BB related hypotension/bradycardia)   History of kidney stones    Hyperlipidemia    Left nephrolithiasis 04/2017   s/p L Ureteral stent for L  hydronephrosis & UTI/Urosepsis.     Myocardial infarction (HCC)    Sepsis (HCC)    Skin ulcers of foot, bilateral (HCC)    Stroke (HCC)    mild stroke no residuals    Past Surgical History:  Procedure Laterality Date   CHOLECYSTECTOMY     CYSTOSCOPY W/ URETERAL STENT PLACEMENT Left 05/09/2017   Procedure: CYSTOSCOPY WITH RETROGRADE PYELOGRAM/URETERAL STENT PLACEMENT;  Surgeon: Jerilee Field, MD;  Location: [facility];  Service: Urology;  Laterality: Left;   CYSTOSCOPY W/ URETERAL STENT PLACEMENT Left 01/05/2024   Procedure: CYSTOSCOPY WITH RETROGRADE PYELOGRAM/URETERAL STENT PLACEMENT;  Surgeon: Malen Gauze, MD;  Location: [facility];  Service: Urology;  Laterality: Left;   CYSTOSCOPY WITH RETROGRADE PYELOGRAM, URETEROSCOPY AND STENT PLACEMENT Left 06/08/2017   Procedure: CYSTOSCOPY WITH LEFT RETROGRADE PYELOGRAM, URETEROSCOPY HOLMIUM LASER LITHOTRIPSY BASKET EXTRACTION  AND STENT EXCHANGE;  Surgeon: Jerilee Field, MD;  Location: [facility];  Service: Urology;  Laterality: Left;   EYE SURGERY     right   HOLMIUM LASER APPLICATION Left 06/08/2017   Procedure: HOLMIUM LASER APPLICATION;  Surgeon: Jerilee Field, MD;  Location: [facility];  Service: Urology;  Laterality: Left;   LITHOTRIPSY     REVERSE SHOULDER ARTHROPLASTY Right 07/20/2017   Procedure: REVERSE RIGHT SHOULDER ARTHROPLASTY;  Surgeon: Yolonda Kida, MD;  Location: [facility];  Service: Orthopedics;  Laterality: Right;  150 mins     reports that he has never smoked. He has never used smokeless tobacco. He reports that he does not drink alcohol and does not use drugs.  No Known Allergies  Family History  Problem Relation Age of Onset   Lung cancer Father        black lung   CAD Neg Hx    Kidney Stones Neg Hx    Aneurysm Neg Hx     Prior to Admission medications   Medication Sig Start Date End Date Taking? Authorizing Provider  apixaban (ELIQUIS) 5 MG TABS tablet Take 2.5 mg by mouth 2 (two) times daily.     [provider]  carvedilol (COREG) 6.25 MG tablet Take 1 tablet (6.25 mg total) by mouth 2 (two) times daily with a meal. Patient taking differently: Take 3.125 mg by mouth 2 (two) times daily. 08/02/21   Rodolph Bong, MD  Docusate Calcium (STOOL SOFTENER PO) Take 1 tablet by mouth daily.    [provider]  gabapentin (NEURONTIN) 300 MG capsule Take 1 capsule (300 mg total) by mouth at bedtime. 01/09/24   Lanae Boast, MD  Glucerna (GLUCERNA) LIQD Take 237 mLs by mouth daily.    [provider]  latanoprost (XALATAN) 0.005 % ophthalmic solution Place 1 drop into both eyes at bedtime.    [provider]  lisinopril (ZESTRIL) 20 MG tablet Take 20 mg by mouth daily.    [provider]  loratadine (CLARITIN) 10 MG tablet Take 10 mg by mouth daily with breakfast.     [provider]  meclizine (ANTIVERT) 25 MG tablet Take 25 mg by mouth daily.    [provider]  Multiple Vitamin (MULTIVITAMIN WITH MINERALS) TABS tablet Take 1 tablet  by mouth daily.    [provider]  mupirocin ointment (BACTROBAN) 2 % Apply 1 Application topically 2 (two) times daily.    [provider]  nystatin-triamcinolone (MYCOLOG II) cream Apply 1 Application topically as needed (sores). 09/18/22   [provider]  ondansetron (ZOFRAN) 8 MG tablet Take 8 mg by mouth daily as needed for nausea or vomiting.    [provider]  Propylene Glycol 0.6 % SOLN Place 2 drops into both eyes daily as needed (dry eyes).    [provider]  simvastatin (ZOCOR) 20 MG tablet Take 10 mg by mouth daily.    [provider]  SITagliptin (ZITUVIO) 25 MG TABS Take 25 mg by mouth daily.    [provider]  traMADol (ULTRAM) 50 MG tablet Take 50 mg by mouth 3 (three) times daily.    [provider]  VITAMIN D PO Take 1 capsule by mouth daily.    [provider]  zolpidem (AMBIEN) 10 MG tablet Take 10 mg by  mouth at bedtime.    [provider]    Physical Exam: Vitals:   02/10/24 0143 02/10/24 0220 02/10/24 0300 02/10/24 0330  BP:  (!) 102/50 (!) 103/54 (!) 97/52  Pulse: 95 96 88 87  Resp:  18  18  Temp: 99.7 F (37.6 C)     TempSrc: Oral     SpO2: 97% 98% 98% 99%    Physical Exam Vitals reviewed.  Constitutional:      General: He is not in acute distress. HENT:     Head: Normocephalic and atraumatic.  Eyes:     Extraocular Movements: Extraocular movements intact.  Cardiovascular:     Rate and Rhythm: Normal rate and regular rhythm.     Pulses: Normal pulses.  Pulmonary:     Effort: Pulmonary effort is normal. No respiratory distress.     Breath sounds: Normal breath sounds. No wheezing or rales.  Abdominal:     General: Bowel sounds are normal. There is no distension.     Palpations: Abdomen is soft.     Tenderness: There is no abdominal tenderness. There is no guarding or rebound.  Musculoskeletal:     Cervical back: Normal range of motion.     Right lower leg: No edema.     Left lower leg: No edema.  Skin:    General: Skin is warm and dry.  Neurological:     General: No focal deficit present.     Mental Status: He is alert. Mental status is at baseline.     Labs on Admission: I have personally reviewed following labs and imaging studies  CBC: Recent Labs  Lab 02/08/24 0940 02/09/24 2157  WBC 11.8* 12.6*  NEUTROABS  --  9.6*  HGB 10.1* 10.8*  HCT 32.7* 35.7*  MCV 99.4 102.0*  PLT 329 369   Basic Metabolic Panel: Recent Labs  Lab 02/08/24 0940 02/09/24 2157  NA 135 131*  K 5.9* 6.0*  CL 107 103  CO2 17* 17*  GLUCOSE 126* 146*  BUN 68* 75*  CREATININE 4.20* 4.46*  CALCIUM 8.8* 8.5*   GFR: Estimated Creatinine Clearance: 13.4 mL/min (A) (by C-G formula based on SCr of 4.46 mg/dL (H)). Liver Function Tests: Recent Labs  Lab 02/09/24 2157  AST 61*  ALT 53*  ALKPHOS 46  BILITOT 0.3  PROT 7.5  ALBUMIN 2.7*   No results for  input(s): "LIPASE", "AMYLASE" in the last 168 hours. No results for input(s): "  AMMONIA" in the last 168 hours. Coagulation Profile: Recent Labs  Lab 02/09/24 2157  INR 1.3*   Cardiac Enzymes: No results for input(s): "CKTOTAL", "CKMB", "CKMBINDEX", "TROPONINI" in the last 168 hours. BNP (last 3 results) No results for input(s): "PROBNP" in the last 8760 hours. HbA1C: No results for input(s): "HGBA1C" in the last 72 hours. CBG: Recent Labs  Lab 02/08/24 0928  GLUCAP 96   Lipid Profile: No results for input(s): "CHOL", "HDL", "LDLCALC", "TRIG", "CHOLHDL", "LDLDIRECT" in the last 72 hours. Thyroid Function Tests: No results for input(s): "TSH", "T4TOTAL", "FREET4", "T3FREE", "THYROIDAB" in the last 72 hours. Anemia Panel: No results for input(s): "VITAMINB12", "FOLATE", "FERRITIN", "TIBC", "IRON", "RETICCTPCT" in the last 72 hours. Urine analysis:    Component Value Date/Time   COLORURINE YELLOW (A) 02/09/2024 2217   APPEARANCEUR TURBID (A) 02/09/2024 2217   LABSPEC 1.010 02/09/2024 2217   PHURINE 5.0 02/09/2024 2217   GLUCOSEU NEGATIVE 02/09/2024 2217   HGBUR LARGE (A) 02/09/2024 2217   BILIRUBINUR NEGATIVE 02/09/2024 2217   KETONESUR NEGATIVE 02/09/2024 2217   PROTEINUR 100 (A) 02/09/2024 2217   UROBILINOGEN 0.2 05/19/2009 0733   NITRITE NEGATIVE 02/09/2024 2217   LEUKOCYTESUR MODERATE (A) 02/09/2024 2217    Radiological Exams on Admission: DG Abdomen 1 View Result Date: 02/09/2024 CLINICAL DATA:  Evaluation for stent placement. EXAM: ABDOMEN - 1 VIEW COMPARISON:  CT 01/05/2024 FINDINGS: No radiographic evidence of bowel obstruction. Peripherally calcified round lesion in the central mesentery is unchanged from prior CT. Left ureteral stent projects over the expected course of the left ureter. No radiopaque urinary calculi. IMPRESSION: 1. Left ureteral stent projects over the expected course of the left ureter. No radiopaque urinary calculi. Electronically Signed   By:  Minerva Fester M.D.   On: 02/09/2024 23:47   DG Chest 2 View Result Date: 02/09/2024 CLINICAL DATA:  Possible sepsis EXAM: CHEST - 2 VIEW COMPARISON:  01/05/2024 FINDINGS: Cardiac shadow is within normal limits. Aortic calcifications are noted. The lungs are well aerated bilaterally. No bony abnormality is noted. Surgical changes in the right shoulder are seen. IMPRESSION: No active cardiopulmonary disease. Electronically Signed   By: Alcide Clever M.D.   On: 02/09/2024 22:59    EKG: Independently reviewed.  A-fib, occasional PVCs.  No acute changes.  Assessment and Plan  Sepsis secondary to complicated UTI Met SIRS criteria at the time of presentation with fever, mild tachycardia, and mild leukocytosis.  Lactate mildly elevated and blood pressure currently soft with systolic 90-100. He was admitted in January for obstructing ureteral calculus requiring emergent cystoscopy with left ureteral stent placement.  Discharged with a Foley catheter.  UA with moderate amount of leukocytes and microscopy showing >50 RBCs, >50 WBCs, and many bacteria. Abdominal x-ray showed left ureteral stent in place and no radiopaque urinary calculi.  Urology felt there was no need for urgent urologic intervention at this time.  Recommended treating infection and outpatient follow-up as previously planned.  Foley catheter replaced in the ED.  Continue cefepime and IV fluid resuscitation.  Trend WBC count and lactate.  Follow-up urine and blood cultures.  AKI on CKD stage IIIb BUN 75, creatinine 4.4 (stable compared to labs a month ago but creatinine was previously 1.3-1.5 in 2022).  Ureteral stent in place and no obstructing radiopaque stone seen on x-ray.  IV fluid hydration and monitor renal function.  Avoid nephrotoxic agents/hold home lisinopril.  Hyperkalemia In the setting of acute on chronic kidney injury and ACE inhibitor use.  Patient was  given treatment in the ED including calcium gluconate, insulin, sodium  bicarb, and Lokelma.  Repeat labs ordered and hold lisinopril.  Metabolic acidosis In the setting of acute on chronic kidney injury.  Patient received sodium bicarb.  Continue IV fluids and monitor labs.  Mild hyponatremia IV fluid hydration and monitor labs.  Mild elevation of transaminases Likely due to sepsis.  Alk phos and T. bili normal.  No abdominal pain or tenderness.  Monitor LFTs.  Non-insulin-dependent type 2 diabetes Last A1c 6.5 on 01/05/2024.  Placed on sensitive sliding scale insulin ACHS.  Permanent A-fib Currently rate controlled.  Continue Eliquis after pharmacy med rec is done.  Hold Coreg at this time due to soft blood pressure.  Hypertension Avoid antihypertensives at this time.  Chronic anemia Hemoglobin stable, monitor labs.  Dementia: Delirium precautions. History of CVA Hyperlipidemia Pharmacy med rec pending.  DVT prophylaxis: Continue Eliquis after pharmacy med rec is done. Code Status: DNR (discussed with the patient's daughter) Family Communication: Daughter at bedside. Level of care: Progressive Care Unit Admission status: It is my clinical opinion that admission to INPATIENT is reasonable and necessary because of the expectation that this patient will require hospital care that crosses at least 2 midnights to treat this condition based on the medical complexity of the problems presented.  Given the aforementioned information, the predictability of an adverse outcome is felt to be significant.  John Giovanni MD [facility]  If 7PM-7AM, please contact night-coverage [email or web address]  02/10/2024, 4:16 AM

## 2024-02-10 NOTE — Progress Notes (Signed)
 Pharmacy Antibiotic Note  Charles Richard is a 89 y.o. male admitted on 02/09/2024 with UTI.  Pharmacy has been consulted for Cefepime dosing.  -He was admitted 01/05/2024-01/09/2024 for sepsis due to obstructing ureteral calculus and underwent emergent cystoscopy with left ureteral stent placement. +Foley catheter.  Completed 10-day course of cefadroxil.  - CKD noted  Plan: Cefepime 2gm IV q24h Monitor renal function and cx data      Temp (24hrs), Avg:101.1 F (38.4 C), Min:99.7 F (37.6 C), Max:102.4 F (39.1 C)  Recent Labs  Lab 02/08/24 0940 02/09/24 2157 02/09/24 2203  WBC 11.8* 12.6*  --   CREATININE 4.20* 4.46*  --   LATICACIDVEN  --   --  2.1*    Estimated Creatinine Clearance: 13.4 mL/min (A) (by C-G formula based on SCr of 4.46 mg/dL (H)).    No Known Allergies  Antimicrobials this admission: 3/1 Cefepime  >>   Dose adjustments this admission:  Microbiology results: 3/1 BCx:  3/1 UCx:  3/1 Resp PCR: negative  Thank you for allowing pharmacy to be a part of this patient's care.  Junita Push PharmD 02/10/2024 5:33 AM

## 2024-02-10 NOTE — Progress Notes (Signed)
 PROGRESS NOTE    BANNER HUCKABA  ZOX:[id number] DOB: September 12, 1930 DOA: 02/09/2024 PCP: [facility]    Brief Narrative:   89 year old with history of dementia, AAA, permanent A-fib on Eliquis, DM 2, HTN, impaired hearing, CVA, HLD, CKD 3B admitted to the hospital 1/25 - 01/09/2024 for obstructive renal stone requiring emergent cystoscopy and left-sided stent placement.  Eventually discharged on 10 days of oral antibiotics and Foley catheter.  Since his prior discharge he has not followed up with outpatient urology and now admitted for urosepsis.  EDP has consulted urology.  Assessment & Plan:  Principal Problem:   Complicated urinary tract infection Active Problems:   Type 2 diabetes mellitus (HCC)   Hyponatremia   Hyperkalemia   Hyperlipidemia   Metabolic acidosis   Sepsis secondary to complicated UTI due to renal stone and Foley catheter CAUTI -Admitted for urosepsis.  Sepsis physiology slowly improving.  Abdominal x-ray shows left ureteral stent in good placement.  Reviewed by urology, no urgent intervention at this point. - Follow culture data.  Continue cefepime and adjust as necessary - IV fluids.   Chronic kidney disease stage IV -For the past several weeks patient's creatinine has been greater than 4, as I had 7.3 back in January.  Follows at the [place]. - Admission creatinine 4.4.  Continue to monitor urine output.   Hyperkalemia -Slowly improving.  Holding nephrotoxic drugs.   Metabolic acidosis Chronic metabolic acidosis secondary to renal dysfunction   Mild hyponatremia IV fluids    Non-insulin-dependent type 2 diabetes Last A1c 6.5 on 01/05/2024.  Placed on sensitive sliding scale insulin ACHS.   Permanent A-fib Coreg on hold due to soft blood pressure. Eliquis   Hypertension Avoid antihypertensives at this time.   Chronic anemia Hemoglobin stable, monitor labs.   Dementia: Delirium precautions. History of CVA Hyperlipidemia   DVT  prophylaxis: apixaban (ELIQUIS) tablet 2.5 mg Start: 02/10/24 1000Eliquis    Code Status: Do not attempt resuscitation (DNR) PRE-ARREST INTERVENTIONS DESIRED Family Communication: Family at bedside Status is: Inpatient Remains inpatient appropriate because: Continue hospital stay at least for next 1-2 days    Subjective: Feels better today.  Per family he is little more talkative this morning.   Examination:  General exam: Appears calm and comfortable, chronically ill-appearing Respiratory system: Clear to auscultation. Respiratory effort normal. Cardiovascular system: S1 & S2 heard, RRR. No JVD, murmurs, rubs, gallops or clicks. No pedal edema. Gastrointestinal system: Abdomen is nondistended, soft and nontender. No organomegaly or masses felt. Normal bowel sounds heard. Central nervous system: Alert and oriented. No focal neurological deficits. Extremities: Symmetric 4x 5 power. Skin: No rashes, lesions or ulcers Psychiatry: Judgement and insight appear normal. Mood & affect appropriate. Foley catheter in place               Diet Orders (From admission, onward)     Start     Ordered   02/10/24 0525  Diet heart healthy/carb modified Room service appropriate? Yes; Fluid consistency: Thin  Diet effective now       Question Answer Comment  Diet-HS Snack? Nothing   Room service appropriate? Yes   Fluid consistency: Thin      02/10/24 0526            Objective: Vitals:   02/10/24 0600 02/10/24 0630 02/10/24 0935 02/10/24 1002  BP: 100/62 105/63 135/72   Pulse: 85 84 91   Resp: 18 18 18    Temp:   97.7 F (36.5 C)   TempSrc:  Oral   SpO2: 98% 99% 100%   Height:    6' (1.829 m)    Intake/Output Summary (Last 24 hours) at 02/10/2024 1136 Last data filed at 02/10/2024 0019 Gross per 24 hour  Intake 133.18 ml  Output --  Net 133.18 ml   There were no vitals filed for this visit.  Scheduled Meds:  apixaban  2.5 mg Oral BID   Chlorhexidine Gluconate Cloth   6 each Topical Daily   docusate sodium  100 mg Oral Daily   gabapentin  300 mg Oral QHS   insulin aspart  0-5 Units Subcutaneous QHS   insulin aspart  0-9 Units Subcutaneous TID WC   latanoprost  1 drop Both Eyes QHS   [START ON 02/11/2024] loratadine  10 mg Oral Q48H   simvastatin  10 mg Oral Daily   Continuous Infusions:  ceFEPime (MAXIPIME) IV      Nutritional status     Body mass index is 31.06 kg/m.  Data Reviewed:   CBC: Recent Labs  Lab 02/08/24 0940 02/09/24 2157 02/10/24 0606  WBC 11.8* 12.6* 10.3  NEUTROABS  --  9.6*  --   HGB 10.1* 10.8* 9.4*  HCT 32.7* 35.7* 29.5*  MCV 99.4 102.0* 99.7  PLT 329 369 320   Basic Metabolic Panel: Recent Labs  Lab 02/08/24 0940 02/09/24 2157 02/10/24 0606  NA 135 131* 134*  K 5.9* 6.0* 5.4*  CL 107 103 107  CO2 17* 17* 17*  GLUCOSE 126* 146* 111*  BUN 68* 75* 72*  CREATININE 4.20* 4.46* 4.49*  CALCIUM 8.8* 8.5* 8.4*   GFR: Estimated Creatinine Clearance: 13.4 mL/min (A) (by C-G formula based on SCr of 4.49 mg/dL (H)). Liver Function Tests: Recent Labs  Lab 02/09/24 2157 02/10/24 0606  AST 61* 38  ALT 53* 43  ALKPHOS 46 34*  BILITOT 0.3 0.6  PROT 7.5 6.3*  ALBUMIN 2.7* 2.2*   No results for input(s): "LIPASE", "AMYLASE" in the last 168 hours. No results for input(s): "AMMONIA" in the last 168 hours. Coagulation Profile: Recent Labs  Lab 02/09/24 2157  INR 1.3*   Cardiac Enzymes: No results for input(s): "CKTOTAL", "CKMB", "CKMBINDEX", "TROPONINI" in the last 168 hours. BNP (last 3 results) No results for input(s): "PROBNP" in the last 8760 hours. HbA1C: No results for input(s): "HGBA1C" in the last 72 hours. CBG: Recent Labs  Lab 02/08/24 0928 02/10/24 0819 02/10/24 0932  GLUCAP 96 93 89   Lipid Profile: No results for input(s): "CHOL", "HDL", "LDLCALC", "TRIG", "CHOLHDL", "LDLDIRECT" in the last 72 hours. Thyroid Function Tests: No results for input(s): "TSH", "T4TOTAL", "FREET4", "T3FREE",  "THYROIDAB" in the last 72 hours. Anemia Panel: No results for input(s): "VITAMINB12", "FOLATE", "FERRITIN", "TIBC", "IRON", "RETICCTPCT" in the last 72 hours. Sepsis Labs: Recent Labs  Lab 02/09/24 2203 02/10/24 0618  LATICACIDVEN 2.1* 0.6    Recent Results (from the past 240 hours)  Culture, blood (Routine x 2)     Status: None (Preliminary result)   Collection Time: 02/09/24  9:54 PM   Specimen: BLOOD RIGHT FOREARM  Result Value Ref Range Status   Specimen Description   Final    BLOOD RIGHT FOREARM Performed at [facility], [address] [place], [place] [address]    Special Requests   Final    BOTTLES DRAWN AEROBIC AND ANAEROBIC Blood Culture results may not be optimal due to an inadequate volume of blood received in culture bottles Performed at [facility], [address] [place], [place] [address]    Culture   Final    NO GROWTH < 12 HOURS Performed at [facility], [address], [place], [place] [address]    Report Status PENDING  Incomplete  Culture, blood (Routine x 2)     Status: None (Preliminary result)   Collection Time: 02/09/24 10:17 PM   Specimen: BLOOD LEFT FOREARM  Result Value Ref Range Status   Specimen Description   Final    BLOOD LEFT FOREARM Performed at [facility], [address], [place], [place] [address]    Special Requests   Final    BOTTLES DRAWN AEROBIC AND ANAEROBIC Blood Culture results may not be optimal due to an inadequate volume of blood received in culture bottles Performed at [facility], [address] [place], [place] [address]    Culture   Final    NO GROWTH < 12 HOURS Performed at [facility], [address] [place], [place] [address]    Report Status PENDING  Incomplete  Resp panel by RT-PCR (RSV, Flu A&B, Covid) Anterior Nasal Swab     Status: None   Collection Time: 02/09/24 10:55 PM   Specimen: Anterior Nasal Swab  Result Value Ref Range Status    SARS Coronavirus 2 by RT PCR NEGATIVE NEGATIVE Final    Comment: (NOTE) SARS-CoV-2 target nucleic acids are NOT DETECTED.  The SARS-CoV-2 RNA is generally detectable in upper respiratory specimens during the acute phase of infection. The lowest concentration of SARS-CoV-2 viral copies this assay can detect is 138 copies/mL. A negative result does not preclude SARS-Cov-2 infection and should not be used as the sole basis for treatment or other patient management decisions. A negative result may occur with  improper specimen collection/handling, submission of specimen other than nasopharyngeal swab, presence of viral mutation(s) within the areas targeted by this assay, and inadequate number of viral copies(<138 copies/mL). A negative result must be combined with clinical observations, patient history, and epidemiological information. The expected result is Negative.  Fact Sheet for Patients:  BloggerCourse.com  Fact Sheet for Healthcare Providers:  SeriousBroker.it  This test is no t yet approved or cleared by the [place] FDA and  has been authorized for detection and/or diagnosis of SARS-CoV-2 by FDA under an Emergency Use Authorization (EUA). This EUA will remain  in effect (meaning this test can be used) for the duration of the COVID-19 declaration under Section 564(b)(1) of the Act, 21 U.S.C.section 360bbb-3(b)(1), unless the authorization is terminated  or revoked sooner.       Influenza A by PCR NEGATIVE NEGATIVE Final   Influenza B by PCR NEGATIVE NEGATIVE Final    Comment: (NOTE) The Xpert Xpress SARS-CoV-2/FLU/RSV plus assay is intended as an aid in the diagnosis of influenza from Nasopharyngeal swab specimens and should not be used as a sole basis for treatment. Nasal washings and aspirates are unacceptable for Xpert Xpress SARS-CoV-2/FLU/RSV testing.  Fact Sheet for  Patients: BloggerCourse.com  Fact Sheet for Healthcare Providers: SeriousBroker.it  This test is not yet approved or cleared by the [place] FDA and has been authorized for detection and/or diagnosis of SARS-CoV-2 by FDA under an Emergency Use Authorization (EUA). This EUA will remain in effect (meaning this test can be used) for the duration of the COVID-19 declaration under Section 564(b)(1) of the Act, 21 U.S.C. section 360bbb-3(b)(1), unless the authorization is terminated or revoked.     Resp Syncytial Virus by PCR NEGATIVE  NEGATIVE Final    Comment: (NOTE) Fact Sheet for Patients: BloggerCourse.com  Fact Sheet for Healthcare Providers: SeriousBroker.it  This test is not yet approved or cleared by the [place] FDA and has been authorized for detection and/or diagnosis of SARS-CoV-2 by FDA under an Emergency Use Authorization (EUA). This EUA will remain in effect (meaning this test can be used) for the duration of the COVID-19 declaration under Section 564(b)(1) of the Act, 21 U.S.C. section 360bbb-3(b)(1), unless the authorization is terminated or revoked.  Performed at [facility], [address] [place], [place] [address]          Radiology Studies: DG Abdomen 1 View Result Date: 02/09/2024 CLINICAL DATA:  Evaluation for stent placement. EXAM: ABDOMEN - 1 VIEW COMPARISON:  CT 01/05/2024 FINDINGS: No radiographic evidence of bowel obstruction. Peripherally calcified round lesion in the central mesentery is unchanged from prior CT. Left ureteral stent projects over the expected course of the left ureter. No radiopaque urinary calculi. IMPRESSION: 1. Left ureteral stent projects over the expected course of the left ureter. No radiopaque urinary calculi. Electronically Signed   By: Minerva Fester M.D.   On: 02/09/2024 23:47   DG Chest 2  View Result Date: 02/09/2024 CLINICAL DATA:  Possible sepsis EXAM: CHEST - 2 VIEW COMPARISON:  01/05/2024 FINDINGS: Cardiac shadow is within normal limits. Aortic calcifications are noted. The lungs are well aerated bilaterally. No bony abnormality is noted. Surgical changes in the right shoulder are seen. IMPRESSION: No active cardiopulmonary disease. Electronically Signed   By: Alcide Clever M.D.   On: 02/09/2024 22:59           LOS: 0 days   Time spent= 35 mins    Miguel Rota, MD [facility]  If 7PM-7AM, please contact night-coverage  02/10/2024, 11:36 AM

## 2024-02-10 NOTE — Progress Notes (Signed)
   02/10/24 1216  TOC Brief Assessment  Insurance and Status Reviewed  Patient has primary care physician Yes  Home environment has been reviewed home w/ children  Prior level of function: children assist with care  Prior/Current Home Services No current home services  Social Drivers of Health Review SDOH reviewed no interventions necessary  Readmission risk has been reviewed Yes  Transition of care needs transition of care needs identified, TOC will continue to follow

## 2024-02-10 NOTE — Progress Notes (Signed)
 Pt noted to be leaking large amounts of urine around urinary catheter. MD notified. 81F coude removed and replaced with 60F coude. Pt tolerated well. Urine returned on insertion. Skin surrounding catheter entry site noted to be red and tender, MD made aware. Neosporin applied to area.   After obtaining catheter, pt stated he needed to use the BSC, upon standing HR Afib sustaining in the 150s. After lying back down in bed, HR back to baseline Afib in theh 90s. MD notified.

## 2024-02-10 NOTE — Hospital Course (Addendum)
 Brief Narrative:   89 year old with history of dementia, AAA, permanent A-fib on Eliquis, DM 2, HTN, impaired hearing, CVA, HLD, CKD 3B admitted to the hospital 1/25 - 01/09/2024 for obstructive renal stone requiring emergent cystoscopy and left-sided stent placement.  Eventually discharged on 10 days of oral antibiotics and Foley catheter.  Since his prior discharge he has not followed up with outpatient urology and now admitted for urosepsis.  EDP has consulted urology.  Discussed with Dr Mena Goes, requested to keep him on IV abx inptn, and he will perform stent removal inptn on Friday.   Assessment & Plan:  Principal Problem:   Complicated urinary tract infection Active Problems:   Type 2 diabetes mellitus (HCC)   Hyponatremia   Hyperkalemia   Hyperlipidemia   Metabolic acidosis   Sepsis secondary to complicated UTI due to renal stone and Foley catheter CAUTI -Admitted for urosepsis.  Sepsis physiology slowly improving.  Abdominal x-ray shows left ureteral stent in good placement.  Reviewed by urology, no urgent intervention at this point.  Foley catheter replaced by the nurses on 3/2. - Follow culture data.  Continue cefepime and adjust as necessary  Discussed with Dr Mena Goes, requested to keep him on IV abx inptn, and he will perform stent removal inptn on Friday.   Permanent A-fib, intermittent RVR Blood pressure is now better but then due to intermittent RVR will resume home Coreg Eliquis   Due to drowsiness and worsening renal function, will discontinue Ambien and gabapentin for now  Chronic kidney disease stage IV Metabolic acidosis.  -For the past several weeks patient's creatinine has been greater than 4, as I had 7.3 back in January.  Follows at the [place]. - Admission creatinine 4.4 > 5.3.  IVF, NaBicarb started. Order Renal US   Hyperkalemia - Improved    Hypomagnesemia P.o. iron repletion    Non-insulin-dependent type 2 diabetes Last A1c 6.5 on 01/05/2024.  Placed  on sensitive sliding scale insulin ACHS.   Hypertension Resume Coreg IV as needed   Chronic anemia Hemoglobin stable, monitor labs.   Dementia: Delirium precautions. History of CVA Hyperlipidemia   DVT prophylaxis: apixaban (ELIQUIS) tablet 2.5 mg Start: 02/10/24 1000Eliquis    Code Status: Do not attempt resuscitation (DNR) PRE-ARREST INTERVENTIONS DESIRED Family Communication: Called [name] Status is: Inpatient Remains inpatient appropriate because: Continue hospital stay at least until Saturday per urology.    Subjective: Doing ok, no complaints. Poor po intake.  Ok with procedure later this week.    Examination:  General exam: Appears calm and comfortable, chronically ill-appearing Respiratory system: Clear to auscultation. Respiratory effort normal. Cardiovascular system: S1 & S2 heard, RRR. No JVD, murmurs, rubs, gallops or clicks. No pedal edema. Gastrointestinal system: Abdomen is nondistended, soft and nontender. No organomegaly or masses felt. Normal bowel sounds heard. Central nervous system: Alert and oriented. No focal neurological deficits. Extremities: Symmetric 4x 5 power. Skin: No rashes, lesions or ulcers Psychiatry: Judgement and insight appear normal. Mood & affect appropriate. Foley catheter in place

## 2024-02-10 NOTE — ED Provider Notes (Signed)
 [facility] FLO[facility] PROGRESSIVE CARE AND UROLOGY Provider Note   CSN: [id number] Arrival date & time: 02/09/24  2126     History  Chief Complaint  Patient presents with   Fever    Charles Richard is a 89 y.o. male.  HPI     89 y.o. male with medical history significant of dementia, AAA, permanent A-fib on Eliquis, type 2 diabetes, hypertension, impaired hearing, history of CVA, hyperlipidemia, CKD stage IIIb comes in with chief complaint of fevers, weakness, chills.  Patient accompanied by daughter, who provides substantial part of the history.  Pt was admitted 01/05/2024-01/09/2024 for sepsis due to obstructing ureteral calculus and underwent emergent cystoscopy with left ureteral stent placement.  Stone extraction is scheduled for later this month.  Patient started developing some burning with urination about 2 days ago, they called urology who started him on Keflex.  They instructed that patient come to the ER if he starts to get worse.  Today patient has been having generalized weakness, rigors, fevers and so daughter brought the patient in the ER.  No vomiting.  No diarrhea.  Foley catheter has been placed since January.  Home Medications Prior to Admission medications   Medication Sig Start Date End Date Taking? Authorizing Provider  apixaban (ELIQUIS) 5 MG TABS tablet Take 2.5 mg by mouth 2 (two) times daily.   Yes [provider]  carvedilol (COREG) 6.25 MG tablet Take 1 tablet (6.25 mg total) by mouth 2 (two) times daily with a meal. Patient taking differently: Take 6.25 mg by mouth daily. 08/02/21  Yes Rodolph Bong, MD  Docusate Calcium (STOOL SOFTENER PO) Take 1 tablet by mouth daily.   Yes [provider]  gabapentin (NEURONTIN) 300 MG capsule Take 1 capsule (300 mg total) by mouth at bedtime. 01/09/24  Yes Lanae Boast, MD  Glucerna (GLUCERNA) LIQD Take 237 mLs by mouth daily.   Yes [provider]  latanoprost (XALATAN) 0.005 % ophthalmic  solution Place 1 drop into both eyes at bedtime.   Yes [provider]  lisinopril (ZESTRIL) 20 MG tablet Take 20 mg by mouth daily.   Yes [provider]  loratadine (CLARITIN) 10 MG tablet Take 10 mg by mouth daily with breakfast.    Yes [provider]  meclizine (ANTIVERT) 25 MG tablet Take 25 mg by mouth daily.   Yes [provider]  Multiple Vitamin (MULTIVITAMIN WITH MINERALS) TABS tablet Take 1 tablet by mouth daily.   Yes [provider]  mupirocin ointment (BACTROBAN) 2 % Apply 1 Application topically 2 (two) times daily.   Yes [provider]  nystatin-triamcinolone (MYCOLOG II) cream Apply 1 Application topically as needed (sores). 09/18/22  Yes [provider]  ondansetron (ZOFRAN) 8 MG tablet Take 8 mg by mouth daily as needed for nausea or vomiting.   Yes [provider]  Propylene Glycol 0.6 % SOLN Place 2 drops into both eyes daily as needed (dry eyes).   Yes [provider]  simvastatin (ZOCOR) 20 MG tablet Take 10 mg by mouth daily.   Yes [provider]  SITagliptin (ZITUVIO) 25 MG TABS Take 25 mg by mouth daily.   Yes [provider]  traMADol (ULTRAM) 50 MG tablet Take 50 mg by mouth at bedtime as needed for moderate pain (pain score 4-6).   Yes [provider]  VITAMIN D PO Take 1 capsule by mouth daily.   Yes [provider]  zolpidem (AMBIEN) 10 MG  tablet Take 10 mg by mouth at bedtime.   Yes [provider]      Allergies    Patient has no known allergies.    Review of Systems   Review of Systems  All other systems reviewed and are negative.   Physical Exam Updated Vital Signs BP 135/70   Pulse 87   Temp 98.5 F (36.9 C) (Oral)   Resp 16   Ht 6' (1.829 m)   Wt 103.5 kg   SpO2 100%   BMI 30.95 kg/m  Physical Exam Vitals and nursing note reviewed.  Constitutional:      Appearance: He is well-developed.  HENT:     Head:  Atraumatic.  Cardiovascular:     Rate and Rhythm: Normal rate.  Pulmonary:     Effort: Pulmonary effort is normal.  Abdominal:     Tenderness: There is abdominal tenderness.  Musculoskeletal:     Cervical back: Neck supple.  Skin:    General: Skin is warm.  Neurological:     Mental Status: He is alert. Mental status is at baseline.     ED Results / Procedures / Treatments   Labs (all labs ordered are listed, but only abnormal results are displayed) Labs Reviewed  COMPREHENSIVE METABOLIC PANEL - Abnormal; Notable for the following components:      Result Value   Sodium 131 (*)    Potassium 6.0 (*)    CO2 17 (*)    Glucose, Bld 146 (*)    BUN 75 (*)    Creatinine, Ser 4.46 (*)    Calcium 8.5 (*)    Albumin 2.7 (*)    AST 61 (*)    ALT 53 (*)    GFR, Estimated 12 (*)    All other components within normal limits  CBC WITH DIFFERENTIAL/PLATELET - Abnormal; Notable for the following components:   WBC 12.6 (*)    RBC 3.50 (*)    Hemoglobin 10.8 (*)    HCT 35.7 (*)    MCV 102.0 (*)    Neutro Abs 9.6 (*)    Monocytes Absolute 1.5 (*)    Abs Immature Granulocytes 0.15 (*)    All other components within normal limits  PROTIME-INR - Abnormal; Notable for the following components:   Prothrombin Time 16.7 (*)    INR 1.3 (*)    All other components within normal limits  URINALYSIS, W/ REFLEX TO CULTURE (INFECTION SUSPECTED) - Abnormal; Notable for the following components:   Color, Urine YELLOW (*)    APPearance TURBID (*)    Hgb urine dipstick LARGE (*)    Protein, ur 100 (*)    Leukocytes,Ua MODERATE (*)    Bacteria, UA MANY (*)    All other components within normal limits  URINALYSIS, W/ REFLEX TO CULTURE (INFECTION SUSPECTED) - Abnormal; Notable for the following components:   APPearance TURBID (*)    Glucose, UA 50 (*)    Hgb urine dipstick LARGE (*)    Protein, ur 100 (*)    Leukocytes,Ua LARGE (*)    Bacteria, UA MANY (*)    All other components within normal  limits  CBC - Abnormal; Notable for the following components:   RBC 2.96 (*)    Hemoglobin 9.4 (*)    HCT 29.5 (*)    All other components within normal limits  COMPREHENSIVE METABOLIC PANEL - Abnormal; Notable for the following components:   Sodium 134 (*)    Potassium 5.4 (*)    CO2  17 (*)    Glucose, Bld 111 (*)    BUN 72 (*)    Creatinine, Ser 4.49 (*)    Calcium 8.4 (*)    Total Protein 6.3 (*)    Albumin 2.2 (*)    Alkaline Phosphatase 34 (*)    GFR, Estimated 12 (*)    All other components within normal limits  GLUCOSE, CAPILLARY - Abnormal; Notable for the following components:   Glucose-Capillary 166 (*)    All other components within normal limits  I-STAT CG4 LACTIC ACID, ED - Abnormal; Notable for the following components:   Lactic Acid, Venous 2.1 (*)    All other components within normal limits  CULTURE, BLOOD (ROUTINE X 2)  CULTURE, BLOOD (ROUTINE X 2)  RESP PANEL BY RT-PCR (RSV, FLU A&B, COVID)  RVPGX2  URINE CULTURE  URINE CULTURE  GLUCOSE, CAPILLARY  I-STAT CG4 LACTIC ACID, ED  I-STAT CG4 LACTIC ACID, ED  I-STAT CG4 LACTIC ACID, ED  CBG MONITORING, ED    EKG EKG Interpretation Date/Time:  Saturday February 09 2024 23:17:11 EST Ventricular Rate:  102 PR Interval:    QRS Duration:  88 QT Interval:  319 QTC Calculation: 416 R Axis:   58  Text Interpretation: Atrial fibrillation Ventricular premature complex Low voltage, precordial leads No significant change since last tracing Confirmed by Derwood Kaplan [phone]) on 02/09/2024 11:21:14 PM  Radiology DG Abdomen 1 View Result Date: 02/09/2024 CLINICAL DATA:  Evaluation for stent placement. EXAM: ABDOMEN - 1 VIEW COMPARISON:  CT 01/05/2024 FINDINGS: No radiographic evidence of bowel obstruction. Peripherally calcified round lesion in the central mesentery is unchanged from prior CT. Left ureteral stent projects over the expected course of the left ureter. No radiopaque urinary calculi. IMPRESSION: 1. Left  ureteral stent projects over the expected course of the left ureter. No radiopaque urinary calculi. Electronically Signed   By: Minerva Fester M.D.   On: 02/09/2024 23:47   DG Chest 2 View Result Date: 02/09/2024 CLINICAL DATA:  Possible sepsis EXAM: CHEST - 2 VIEW COMPARISON:  01/05/2024 FINDINGS: Cardiac shadow is within normal limits. Aortic calcifications are noted. The lungs are well aerated bilaterally. No bony abnormality is noted. Surgical changes in the right shoulder are seen. IMPRESSION: No active cardiopulmonary disease. Electronically Signed   By: Alcide Clever M.D.   On: 02/09/2024 22:59    Procedures .Critical Care  Performed by: Derwood Kaplan, MD Authorized by: Derwood Kaplan, MD   Critical care provider statement:    Critical care time (minutes):  48   Critical care was necessary to treat or prevent imminent or life-threatening deterioration of the following conditions:  Sepsis and renal failure   Critical care was time spent personally by me on the following activities:  Development of treatment plan with patient or surrogate, discussions with consultants, evaluation of patient's response to treatment, examination of patient, ordering and review of laboratory studies, ordering and review of radiographic studies, ordering and performing treatments and interventions, pulse oximetry, re-evaluation of patient's condition, review of old charts and obtaining history from patient or surrogate     Medications Ordered in ED Medications  acetaminophen (TYLENOL) tablet 650 mg (has no administration in time range)    Or  acetaminophen (TYLENOL) suppository 650 mg (has no administration in time range)  insulin aspart (novoLOG) injection 0-9 Units (2 Units Subcutaneous Given 02/10/24 1245)  insulin aspart (novoLOG) injection 0-5 Units (has no administration in time range)  ceFEPIme (MAXIPIME) 2 g in sodium chloride 0.9 %  100 mL IVPB (has no administration in time range)  simvastatin  (ZOCOR) tablet 10 mg (10 mg Oral Given 02/10/24 1041)  docusate sodium (COLACE) capsule 100 mg (100 mg Oral Not Given 02/10/24 1040)  apixaban (ELIQUIS) tablet 2.5 mg (2.5 mg Oral Given 02/10/24 1041)  gabapentin (NEURONTIN) capsule 300 mg (has no administration in time range)  loratadine (CLARITIN) tablet 10 mg (has no administration in time range)  latanoprost (XALATAN) 0.005 % ophthalmic solution 1 drop (has no administration in time range)  hydrALAZINE (APRESOLINE) injection 10 mg (has no administration in time range)  metoprolol tartrate (LOPRESSOR) injection 5 mg (has no administration in time range)  senna-docusate (Senokot-S) tablet 1 tablet (has no administration in time range)  guaiFENesin (ROBITUSSIN) 100 MG/5ML liquid 5 mL (has no administration in time range)  ondansetron (ZOFRAN) injection 4 mg (has no administration in time range)  Chlorhexidine Gluconate Cloth 2 % PADS 6 each (6 each Topical Given 02/10/24 1044)  zolpidem (AMBIEN) tablet 5 mg (has no administration in time range)  ceFEPIme (MAXIPIME) 2 g in sodium chloride 0.9 % 100 mL IVPB (0 g Intravenous Stopped 02/10/24 0019)  sodium zirconium cyclosilicate (LOKELMA) packet 10 g (10 g Oral Given 02/09/24 2337)  calcium gluconate 1 g/ 50 mL sodium chloride IVPB (0 mg Intravenous Stopped 02/09/24 2359)  insulin aspart (novoLOG) injection 5 Units (5 Units Intravenous Given 02/09/24 2346)    And  dextrose 50 % solution 50 mL (50 mLs Intravenous Given 02/09/24 2340)  sodium bicarbonate injection 50 mEq (50 mEq Intravenous Given 02/09/24 2340)  sodium chloride 0.9 % bolus 1,000 mL (1,000 mLs Intravenous New Bag/Given 02/10/24 0608)  sodium zirconium cyclosilicate (LOKELMA) packet 10 g (10 g Oral Given 02/10/24 1041)    ED Course/ Medical Decision Making/ A&P                                 Medical Decision Making Amount and/or Complexity of Data Reviewed Labs: ordered. Radiology: ordered.  Risk OTC drugs. Prescription drug  management. Decision regarding hospitalization.   This patient presents to the ED with chief complaint(s) of fevers, chills with pertinent past medical history of infected stone in January, status post stent placement and chronic indwelling Foley catheter since.The complaint involves an extensive differential diagnosis and also carries with it a high risk of complications and morbidity.    The differential diagnosis includes : Acute cystitis, pyelonephritis, severe sepsis, severe electrolyte abnormality, dehydration, intra-abdominal abscess, hydronephrosis, acute on chronic renal failure.  The initial plan is to get basic labs. We will have to insert a new Foley and send a new urine.  It appears that UA was sent from chronic Foley by the nurse already, which will have abnormal finding.  New urine analysis, culture ordered, and the nursing staff will put a new Foley catheter.   Additional history obtained: Additional history obtained from family Records reviewed previous admission documents  Independent labs interpretation:  The following labs were independently interpreted: Patient has baseline CKD.  K is up at 6.   Independent visualization and interpretation of imaging: - I independently visualized the following imaging with scope of interpretation limited to determining acute life threatening conditions related to emergency care: KUB, which revealed no evidence of free air.  Ureteral stent in place.  Treatment and Reassessment: Results of the ED workup and recommendation by urologist discussed with the patient's family.  Consultation: - Consulted or discussed management/test  interpretation with external professional: Spoke with Dr. Vanna Scotland, urology.  She recommends that patient be admitted for sepsis/infection workup.  No acute role for urology at this time.  KUB should be sufficient to ensure that the stent is in place.    Final Clinical Impression(s) / ED Diagnoses Final  diagnoses:  Severe sepsis (HCC)  Acute hyperkalemia    Rx / DC Orders ED Discharge Orders     None         Derwood Kaplan, MD 02/10/24 1527

## 2024-02-11 LAB — PHOSPHORUS: Phosphorus: 4.4 mg/dL (ref 2.5–4.6)

## 2024-02-11 LAB — CBC
HCT: 30 % — ABNORMAL LOW (ref 39.0–52.0)
Hemoglobin: 9.4 g/dL — ABNORMAL LOW (ref 13.0–17.0)
MCH: 30.7 pg (ref 26.0–34.0)
MCHC: 31.3 g/dL (ref 30.0–36.0)
MCV: 98 fL (ref 80.0–100.0)
Platelets: 334 10*3/uL (ref 150–400)
RBC: 3.06 MIL/uL — ABNORMAL LOW (ref 4.22–5.81)
RDW: 11.9 % (ref 11.5–15.5)
WBC: 11.5 10*3/uL — ABNORMAL HIGH (ref 4.0–10.5)
nRBC: 0 % (ref 0.0–0.2)

## 2024-02-11 LAB — BASIC METABOLIC PANEL
Anion gap: 11 (ref 5–15)
BUN: 74 mg/dL — ABNORMAL HIGH (ref 8–23)
CO2: 17 mmol/L — ABNORMAL LOW (ref 22–32)
Calcium: 8.4 mg/dL — ABNORMAL LOW (ref 8.9–10.3)
Chloride: 108 mmol/L (ref 98–111)
Creatinine, Ser: 4.35 mg/dL — ABNORMAL HIGH (ref 0.61–1.24)
GFR, Estimated: 12 mL/min — ABNORMAL LOW (ref >60)
Glucose, Bld: 120 mg/dL — ABNORMAL HIGH (ref 70–99)
Potassium: 5.1 mmol/L (ref 3.5–5.1)
Sodium: 136 mmol/L (ref 135–145)

## 2024-02-11 LAB — URINE CULTURE
Culture: NO GROWTH
Culture: NO GROWTH

## 2024-02-11 LAB — GLUCOSE, CAPILLARY
Glucose-Capillary: 103 mg/dL — ABNORMAL HIGH (ref 70–99)
Glucose-Capillary: 127 mg/dL — ABNORMAL HIGH (ref 70–99)
Glucose-Capillary: 162 mg/dL — ABNORMAL HIGH (ref 70–99)
Glucose-Capillary: 175 mg/dL — ABNORMAL HIGH (ref 70–99)

## 2024-02-11 LAB — MAGNESIUM: Magnesium: 1.6 mg/dL — ABNORMAL LOW (ref 1.7–2.4)

## 2024-02-11 MED ORDER — MAGNESIUM SULFATE 4 GM/100ML IV SOLN
4.0000 g | Freq: Once | INTRAVENOUS | Status: AC
Start: 2024-02-11 — End: 2024-02-11
  Administered 2024-02-11: 4 g via INTRAVENOUS
  Filled 2024-02-11: qty 100

## 2024-02-11 MED ORDER — CARVEDILOL 6.25 MG PO TABS
6.2500 mg | ORAL_TABLET | Freq: Two times a day (BID) | ORAL | Status: DC
  Administered 2024-02-11 – 2024-02-20 (×16): 6.25 mg via ORAL
  Filled 2024-02-11 (×17): qty 1

## 2024-02-11 MED ORDER — METOPROLOL TARTRATE 5 MG/5ML IV SOLN
5.0000 mg | INTRAVENOUS | Status: DC | PRN
  Administered 2024-02-11: 5 mg via INTRAVENOUS
  Filled 2024-02-11: qty 5

## 2024-02-11 MED ORDER — GABAPENTIN 100 MG PO CAPS
100.0000 mg | ORAL_CAPSULE | Freq: Once | ORAL | Status: AC
  Administered 2024-02-11: 100 mg via ORAL
  Filled 2024-02-11: qty 1

## 2024-02-11 MED ORDER — ZOLPIDEM TARTRATE 5 MG PO TABS
10.0000 mg | ORAL_TABLET | Freq: Every evening | ORAL | Status: DC | PRN
  Administered 2024-02-11: 10 mg via ORAL
  Filled 2024-02-11: qty 2

## 2024-02-11 NOTE — Evaluation (Signed)
 Physical Therapy Evaluation Patient Details Name: Charles Richard MRN: [id number] DOB: February 17, 1930 Today's Date: 02/11/2024  History of Present Illness  89 year old  presents to the ED3/1/25  via EMS with complaints of fever, dysuria, and cough. Prior history of dementia, AAA, permanent A-fib on Eliquis, DM 2, HTN, impaired hearing, CVA, HLD, CKD 3B admitted to the hospital 1/25 - 01/09/2024 for obstructive renal stone requiring emergent cystoscopy and left-sided stent placement.  Eventually discharged on 10 days of oral antibiotics and Foley catheter.  Clinical Impression  Pt admitted with above diagnosis.  Pt currently with functional limitations due to the deficits listed below (see PT Problem List). Pt will benefit from acute skilled PT to increase their independence and safety with mobility to allow discharge.     THe patient found resting in bed watching TV. Patient agreeable to ambulate. Son present to provide  most of information . Patient lives with daughter, has caregivers 24/7, has been mod independent for ambulation with RW.  Patient should progress to return home, recommend HHPT again.'       If plan is discharge home, recommend the following: A little help with walking and/or transfers;A little help with bathing/dressing/bathroom;Assistance with cooking/housework;Assist for transportation;Help with stairs or ramp for entrance   Can travel by private vehicle        Equipment Recommendations None recommended by PT  Recommendations for Other Services       Functional Status Assessment Patient has had a recent decline in their functional status and demonstrates the ability to make significant improvements in function in a reasonable and predictable amount of time.     Precautions / Restrictions Precautions Precautions: Fall Precaution/Restrictions Comments: dtr to bring his shoes Restrictions Weight Bearing Restrictions Per Provider Order: No      Mobility  Bed  Mobility Overal bed mobility: Needs Assistance Bed Mobility: Supine to Sit, Sit to Supine     Supine to sit: Min assist Sit to supine: Min assist   General bed mobility comments: min assist to sit up. Assist legs onto bed    Transfers Overall transfer level: Needs assistance Equipment used: Rolling walker (2 wheels) Transfers: Sit to/from Stand Sit to Stand: Min assist, From elevated surface           General transfer comment: Steady assist to rise from bed    Ambulation/Gait Ambulation/Gait assistance: +2 safety/equipment, Min assist Gait Distance (Feet): 40 Feet Assistive device: Rolling walker (2 wheels) Gait Pattern/deviations: Step-through pattern, Step-to pattern, Decreased dorsiflexion - right Gait velocity: decr     General Gait Details: noted  slight right foot drop, assist with Rw too keep going  forward and inside, assist  for turning  Stairs            Wheelchair Mobility     Tilt Bed    Modified Rankin (Stroke Patients Only)       Balance Overall balance assessment: Needs assistance Sitting-balance support: No upper extremity supported, Feet supported Sitting balance-Leahy Scale: Fair     Standing balance support: Bilateral upper extremity supported, Reliant on assistive device for balance, During functional activity Standing balance-Leahy Scale: Poor                               Pertinent Vitals/Pain Pain Assessment Pain Assessment: Faces Faces Pain Scale: Hurts little more Pain Location: right toes    Home Living Family/patient expects to be discharged to:: Private residence Living Arrangements: Children  Available Help at Discharge: Family;Available 24 hours/day Type of Home: House Home Access: Ramped entrance       Home Layout: One level Home Equipment: Rollator (4 wheels);BSC/3in1;Transport chair;Rolling Walker (2 wheels);Cane - single point;Shower seat;Hand held shower head;Grab bars - toilet;Grab bars -  tub/shower;Hospital bed;Lift chair Additional Comments: lives with daughter    Prior Function Prior Level of Function : Needs assist             Mobility Comments: uses rollator at baseline, able to get  up to amb without assistance,  has a lift chair ADLs Comments: patient gest showers with family 3x a week. patient able to participate in UB bathing tasks and LB bathing has support.     Extremity/Trunk Assessment   Upper Extremity Assessment Upper Extremity Assessment: Defer to OT evaluation    Lower Extremity Assessment Lower Extremity Assessment: Generalized weakness    Cervical / Trunk Assessment Cervical / Trunk Assessment: Kyphotic  Communication   Communication Communication: Impaired Factors Affecting Communication: Hearing impaired    Cognition Arousal: Alert     PT - Cognitive impairments: History of cognitive impairments                       PT - Cognition Comments: patient alert and able to foloow directions, very pleasant and willing to get up Following commands: Intact       Cueing Cueing Techniques: Verbal cues, Gestural cues     General Comments      Exercises     Assessment/Plan    PT Assessment Patient needs continued PT services  PT Problem List Decreased strength;Decreased mobility;Decreased safety awareness;Decreased knowledge of precautions;Decreased activity tolerance;Pain;Decreased balance       PT Treatment Interventions DME instruction;Gait training;Functional mobility training;Therapeutic activities;Therapeutic exercise;Patient/family education    PT Goals (Current goals can be found in the Care Plan section)  Acute Rehab PT Goals Patient Stated Goal: plans to return home PT Goal Formulation: With patient/family Time For Goal Achievement: 02/25/24 Potential to Achieve Goals: Good    Frequency Min 1X/week     Co-evaluation               AM-PAC PT "6 Clicks" Mobility  Outcome Measure Help needed  turning from your back to your side while in a flat bed without using bedrails?: A Little Help needed moving from lying on your back to sitting on the side of a flat bed without using bedrails?: A Little Help needed moving to and from a bed to a chair (including a wheelchair)?: A Little Help needed standing up from a chair using your arms (e.g., wheelchair or bedside chair)?: A Little Help needed to walk in hospital room?: A Little Help needed climbing 3-5 steps with a railing? : A Lot 6 Click Score: 17    End of Session Equipment Utilized During Treatment: Gait belt Activity Tolerance: Patient tolerated treatment well Patient left: in bed;with call bell/phone within reach;with family/visitor present Nurse Communication: Mobility status PT Visit Diagnosis: Unsteadiness on feet (R26.81);Muscle weakness (generalized) (M62.81);Difficulty in walking, not elsewhere classified (R26.2);Pain Pain - Right/Left: Right Pain - part of body: Ankle and joints of foot    Time: 1526-1540 PT Time Calculation (min) (ACUTE ONLY): 14 min   Charges:   PT Evaluation $PT Eval Low Complexity: 1 Low   PT General Charges $$ ACUTE PT VISIT: 1 Visit         Blanchard Kelch PT Acute Rehabilitation Services Office [phone]  Rada Hay 02/11/2024, 4:40 PM

## 2024-02-11 NOTE — TOC Progression Note (Signed)
 Transition of Care [facility]) - Progression Note   Patient Details  Name: Charles Richard MRN: [id number] Date of Birth: [birth year]  [facility]) CM/SW Contact  Ewing Schlein, LCSW Phone Number: 02/11/2024, 10:07 AM  Clinical Narrative: Patient was previously active with [facility], but is not active at this time.  Social Determinants of Health (SDOH) Interventions SDOH Screenings   Food Insecurity: No Food Insecurity (02/10/2024)  Housing: Patient Declined (02/10/2024)  Transportation Needs: No Transportation Needs (02/10/2024)  Utilities: Not At Risk (02/10/2024)  Social Connections: Patient Unable To Answer (02/10/2024)  Tobacco Use: Low Risk  (02/09/2024)   Readmission Risk Interventions    02/10/2024   12:16 PM 01/07/2024   10:49 AM  Readmission Risk Prevention Plan  Transportation Screening Complete Complete  PCP or Specialist Appt within 3-5 Days Complete   HRI or Home Care Consult Complete Complete  Social Work Consult for Recovery Care Planning/Counseling Complete Complete  Palliative Care Screening Not Applicable Not Applicable  Medication Review Oceanographer) Complete Complete

## 2024-02-11 NOTE — Progress Notes (Signed)
 PROGRESS NOTE    Charles Richard  GNF:[id number] DOB: 08/26/1930 DOA: 02/09/2024 PCP: [facility]    Brief Narrative:   89 year old with history of dementia, AAA, permanent A-fib on Eliquis, DM 2, HTN, impaired hearing, CVA, HLD, CKD 3B admitted to the hospital 1/25 - 01/09/2024 for obstructive renal stone requiring emergent cystoscopy and left-sided stent placement.  Eventually discharged on 10 days of oral antibiotics and Foley catheter.  Since his prior discharge he has not followed up with outpatient urology and now admitted for urosepsis.  EDP has consulted urology.  Assessment & Plan:  Principal Problem:   Complicated urinary tract infection Active Problems:   Type 2 diabetes mellitus (HCC)   Hyponatremia   Hyperkalemia   Hyperlipidemia   Metabolic acidosis   Sepsis secondary to complicated UTI due to renal stone and Foley catheter CAUTI -Admitted for urosepsis.  Sepsis physiology slowly improving.  Abdominal x-ray shows left ureteral stent in good placement.  Reviewed by urology, no urgent intervention at this point.  Foley catheter replaced by the nurses on 3/2. - Follow culture data.  Continue cefepime and adjust as necessary - IV fluids prn  I have also sent inbox to Dr. Mena Goes notifying about his admission as he has stent removal planned later this week.  Permanent A-fib, intermittent RVR Blood pressure is now better but then due to intermittent RVR will resume home Coreg Eliquis   Chronic kidney disease stage IV -For the past several weeks patient's creatinine has been greater than 4, as I had 7.3 back in January.  Follows at the [place]. - Admission creatinine 4.4.  Continue to monitor urine output.   Hyperkalemia - Improved   Metabolic acidosis Chronic metabolic acidosis secondary to renal dysfunction   Hypomagnesemia P.o. iron repletion    Non-insulin-dependent type 2 diabetes Last A1c 6.5 on 01/05/2024.  Placed on sensitive sliding scale insulin  ACHS.     Hypertension Resume Coreg IV as needed   Chronic anemia Hemoglobin stable, monitor labs.   Dementia: Delirium precautions. History of CVA Hyperlipidemia   DVT prophylaxis: apixaban (ELIQUIS) tablet 2.5 mg Start: 02/10/24 1000Eliquis    Code Status: Do not attempt resuscitation (DNR) PRE-ARREST INTERVENTIONS DESIRED Family Communication: Called Lori Status is: Inpatient Remains inpatient appropriate because: Continue hospital stay at least for next 1-2 days    Subjective: Seen at bedside.  Yesterday went into atrial fibrillation with RVR with movement. No new complaints this morning   Examination:  General exam: Appears calm and comfortable, chronically ill-appearing Respiratory system: Clear to auscultation. Respiratory effort normal. Cardiovascular system: S1 & S2 heard, RRR. No JVD, murmurs, rubs, gallops or clicks. No pedal edema. Gastrointestinal system: Abdomen is nondistended, soft and nontender. No organomegaly or masses felt. Normal bowel sounds heard. Central nervous system: Alert and oriented. No focal neurological deficits. Extremities: Symmetric 4x 5 power. Skin: No rashes, lesions or ulcers Psychiatry: Judgement and insight appear normal. Mood & affect appropriate. Foley catheter in place               Diet Orders (From admission, onward)     Start     Ordered   02/10/24 0525  Diet heart healthy/carb modified Room service appropriate? Yes; Fluid consistency: Thin  Diet effective now       Question Answer Comment  Diet-HS Snack? Nothing   Room service appropriate? Yes   Fluid consistency: Thin      02/10/24 0526  Objective: Vitals:   02/10/24 1431 02/10/24 1800 02/10/24 1956 02/11/24 0444  BP: 135/70 (!) 159/75 (!) 118/95 (!) 106/58  Pulse: 87 97  (!) 104  Resp: 16 20 18 18   Temp: 98.5 F (36.9 C) 99 F (37.2 C) 99.5 F (37.5 C) 98.2 F (36.8 C)  TempSrc: Oral Oral Oral Oral  SpO2: 100%  98% 97%  Weight:       Height:        Intake/Output Summary (Last 24 hours) at 02/11/2024 1134 Last data filed at 02/11/2024 1023 Gross per 24 hour  Intake 820 ml  Output 1450 ml  Net -630 ml   Filed Weights   02/10/24 1200  Weight: 103.5 kg    Scheduled Meds:  apixaban  2.5 mg Oral BID   carvedilol  6.25 mg Oral BID WC   Chlorhexidine Gluconate Cloth  6 each Topical Daily   docusate sodium  100 mg Oral Daily   gabapentin  300 mg Oral QHS   insulin aspart  0-5 Units Subcutaneous QHS   insulin aspart  0-9 Units Subcutaneous TID WC   latanoprost  1 drop Both Eyes QHS   loratadine  10 mg Oral Q48H   simvastatin  10 mg Oral Daily   Continuous Infusions:  ceFEPime (MAXIPIME) IV Stopped (02/10/24 2239)   magnesium sulfate bolus IVPB 4 g (02/11/24 1044)    Nutritional status     Body mass index is 30.95 kg/m.  Data Reviewed:   CBC: Recent Labs  Lab 02/08/24 0940 02/09/24 2157 02/10/24 0606 02/11/24 0420  WBC 11.8* 12.6* 10.3 11.5*  NEUTROABS  --  9.6*  --   --   HGB 10.1* 10.8* 9.4* 9.4*  HCT 32.7* 35.7* 29.5* 30.0*  MCV 99.4 102.0* 99.7 98.0  PLT 329 369 320 334   Basic Metabolic Panel: Recent Labs  Lab 02/08/24 0940 02/09/24 2157 02/10/24 0606 02/11/24 0420  NA 135 131* 134* 136  K 5.9* 6.0* 5.4* 5.1  CL 107 103 107 108  CO2 17* 17* 17* 17*  GLUCOSE 126* 146* 111* 120*  BUN 68* 75* 72* 74*  CREATININE 4.20* 4.46* 4.49* 4.35*  CALCIUM 8.8* 8.5* 8.4* 8.4*  MG  --   --   --  1.6*  PHOS  --   --   --  4.4   GFR: Estimated Creatinine Clearance: 13.8 mL/min (A) (by C-G formula based on SCr of 4.35 mg/dL (H)). Liver Function Tests: Recent Labs  Lab 02/09/24 2157 02/10/24 0606  AST 61* 38  ALT 53* 43  ALKPHOS 46 34*  BILITOT 0.3 0.6  PROT 7.5 6.3*  ALBUMIN 2.7* 2.2*   No results for input(s): "LIPASE", "AMYLASE" in the last 168 hours. No results for input(s): "AMMONIA" in the last 168 hours. Coagulation Profile: Recent Labs  Lab 02/09/24 2157  INR 1.3*    Cardiac Enzymes: No results for input(s): "CKTOTAL", "CKMB", "CKMBINDEX", "TROPONINI" in the last 168 hours. BNP (last 3 results) No results for input(s): "PROBNP" in the last 8760 hours. HbA1C: No results for input(s): "HGBA1C" in the last 72 hours. CBG: Recent Labs  Lab 02/10/24 1135 02/10/24 1635 02/10/24 2119 02/11/24 0744 02/11/24 1126  GLUCAP 166* 197* 128* 103* 127*   Lipid Profile: No results for input(s): "CHOL", "HDL", "LDLCALC", "TRIG", "CHOLHDL", "LDLDIRECT" in the last 72 hours. Thyroid Function Tests: No results for input(s): "TSH", "T4TOTAL", "FREET4", "T3FREE", "THYROIDAB" in the last 72 hours. Anemia Panel: No results for input(s): "VITAMINB12", "FOLATE", "FERRITIN", "TIBC", "IRON", "RETICCTPCT"  in the last 72 hours. Sepsis Labs: Recent Labs  Lab 02/09/24 2203 02/10/24 0618  LATICACIDVEN 2.1* 0.6    Recent Results (from the past 240 hours)  Culture, blood (Routine x 2)     Status: None (Preliminary result)   Collection Time: 02/09/24  9:54 PM   Specimen: BLOOD RIGHT FOREARM  Result Value Ref Range Status   Specimen Description   Final    BLOOD RIGHT FOREARM Performed at [facility], [address] [place], [place] [address]    Special Requests   Final    BOTTLES DRAWN AEROBIC AND ANAEROBIC Blood Culture results may not be optimal due to an inadequate volume of blood received in culture bottles Performed at [facility], [address] [place], [place] [address]    Culture   Final    NO GROWTH 1 DAY Performed at [facility], [address] [place], [place] [address]    Report Status PENDING  Incomplete  Culture, blood (Routine x 2)     Status: None (Preliminary result)   Collection Time: 02/09/24 10:17 PM   Specimen: BLOOD LEFT FOREARM  Result Value Ref Range Status   Specimen Description   Final    BLOOD LEFT FOREARM Performed at [facility], [address], [place], [place] [address]    Special  Requests   Final    BOTTLES DRAWN AEROBIC AND ANAEROBIC Blood Culture results may not be optimal due to an inadequate volume of blood received in culture bottles Performed at [facility], [address] [place], [place] [address]    Culture   Final    NO GROWTH 1 DAY Performed at [facility], [address], [place], [place] [address]    Report Status PENDING  Incomplete  Urine Culture     Status: None   Collection Time: 02/09/24 10:53 PM   Specimen: Urine, Catheterized  Result Value Ref Range Status   Specimen Description   Final    URINE, CATHETERIZED Performed at [facility], [address] [place], [place] [address]    Special Requests   Final    NONE Performed at [facility], [address], [place], [place] [address]    Culture   Final    NO GROWTH Performed at [facility], [address], [place], [place] [address]    Report Status 02/11/2024 FINAL  Final  Resp panel by RT-PCR (RSV, Flu A&B, Covid) Anterior Nasal Swab     Status: None   Collection Time: 02/09/24 10:55 PM   Specimen: Anterior Nasal Swab  Result Value Ref Range Status   SARS Coronavirus 2 by RT PCR NEGATIVE NEGATIVE Final    Comment: (NOTE) SARS-CoV-2 target nucleic acids are NOT DETECTED.  The SARS-CoV-2 RNA is generally detectable in upper respiratory specimens during the acute phase of infection. The lowest concentration of SARS-CoV-2 viral copies this assay can detect is 138 copies/mL. A negative result does not preclude SARS-Cov-2 infection and should not be used as the sole basis for treatment or other patient management decisions. A negative result may occur with  improper specimen collection/handling, submission of specimen other than nasopharyngeal swab, presence of viral mutation(s) within the areas targeted by this assay, and inadequate number of viral copies(<138 copies/mL). A negative result must be combined  with clinical observations, patient history, and epidemiological information. The expected result is Negative.  Fact Sheet for Patients:  BloggerCourse.com  Fact Sheet for Healthcare Providers:  SeriousBroker.it  This test is no t yet approved or cleared by the [place] FDA and  has been authorized for detection and/or diagnosis of SARS-CoV-2 by FDA under an Emergency Use Authorization (EUA). This EUA will remain  in effect (meaning this test can be used) for the duration of the COVID-19 declaration under Section 564(b)(1) of the Act, 21 U.S.C.section 360bbb-3(b)(1), unless the authorization is terminated  or revoked sooner.       Influenza A by PCR NEGATIVE NEGATIVE Final   Influenza B by PCR NEGATIVE NEGATIVE Final    Comment: (NOTE) The Xpert Xpress SARS-CoV-2/FLU/RSV plus assay is intended as an aid in the diagnosis of influenza from Nasopharyngeal swab specimens and should not be used as a sole basis for treatment. Nasal washings and aspirates are unacceptable for Xpert Xpress SARS-CoV-2/FLU/RSV testing.  Fact Sheet for Patients: BloggerCourse.com  Fact Sheet for Healthcare Providers: SeriousBroker.it  This test is not yet approved or cleared by the [place] FDA and has been authorized for detection and/or diagnosis of SARS-CoV-2 by FDA under an Emergency Use Authorization (EUA). This EUA will remain in effect (meaning this test can be used) for the duration of the COVID-19 declaration under Section 564(b)(1) of the Act, 21 U.S.C. section 360bbb-3(b)(1), unless the authorization is terminated or revoked.     Resp Syncytial Virus by PCR NEGATIVE NEGATIVE Final    Comment: (NOTE) Fact Sheet for Patients: BloggerCourse.com  Fact Sheet for Healthcare Providers: SeriousBroker.it  This test is not yet approved  or cleared by the [place] FDA and has been authorized for detection and/or diagnosis of SARS-CoV-2 by FDA under an Emergency Use Authorization (EUA). This EUA will remain in effect (meaning this test can be used) for the duration of the COVID-19 declaration under Section 564(b)(1) of the Act, 21 U.S.C. section 360bbb-3(b)(1), unless the authorization is terminated or revoked.  Performed at [facility], [address], [place], [place] [address]          Radiology Studies: DG Abdomen 1 View Result Date: 02/09/2024 CLINICAL DATA:  Evaluation for stent placement. EXAM: ABDOMEN - 1 VIEW COMPARISON:  CT 01/05/2024 FINDINGS: No radiographic evidence of bowel obstruction. Peripherally calcified round lesion in the central mesentery is unchanged from prior CT. Left ureteral stent projects over the expected course of the left ureter. No radiopaque urinary calculi. IMPRESSION: 1. Left ureteral stent projects over the expected course of the left ureter. No radiopaque urinary calculi. Electronically Signed   By: Minerva Fester M.D.   On: 02/09/2024 23:47   DG Chest 2 View Result Date: 02/09/2024 CLINICAL DATA:  Possible sepsis EXAM: CHEST - 2 VIEW COMPARISON:  01/05/2024 FINDINGS: Cardiac shadow is within normal limits. Aortic calcifications are noted. The lungs are well aerated bilaterally. No bony abnormality is noted. Surgical changes in the right shoulder are seen. IMPRESSION: No active cardiopulmonary disease. Electronically Signed   By: Alcide Clever M.D.   On: 02/09/2024 22:59           LOS: 1 day   Time spent= 35 mins    Miguel Rota, MD [facility]  If 7PM-7AM, please contact night-coverage  02/11/2024, 11:34 AM

## 2024-02-12 ENCOUNTER — Inpatient Hospital Stay (HOSPITAL_COMMUNITY)

## 2024-02-12 LAB — GLUCOSE, CAPILLARY
Glucose-Capillary: 135 mg/dL — ABNORMAL HIGH (ref 70–99)
Glucose-Capillary: 171 mg/dL — ABNORMAL HIGH (ref 70–99)
Glucose-Capillary: 139 mg/dL — ABNORMAL HIGH (ref 70–99)
Glucose-Capillary: 93 mg/dL (ref 70–99)
Glucose-Capillary: 219 mg/dL — ABNORMAL HIGH (ref 70–99)

## 2024-02-12 LAB — CBC
HCT: 31.7 % — ABNORMAL LOW (ref 39.0–52.0)
Hemoglobin: 9.5 g/dL — ABNORMAL LOW (ref 13.0–17.0)
MCH: 31 pg (ref 26.0–34.0)
MCHC: 30 g/dL (ref 30.0–36.0)
MCV: 103.6 fL — ABNORMAL HIGH (ref 80.0–100.0)
Platelets: 300 10*3/uL (ref 150–400)
RBC: 3.06 MIL/uL — ABNORMAL LOW (ref 4.22–5.81)
RDW: 12 % (ref 11.5–15.5)
WBC: 15.4 10*3/uL — ABNORMAL HIGH (ref 4.0–10.5)
nRBC: 0 % (ref 0.0–0.2)

## 2024-02-12 LAB — BASIC METABOLIC PANEL
Anion gap: 14 (ref 5–15)
BUN: 77 mg/dL — ABNORMAL HIGH (ref 8–23)
CO2: 14 mmol/L — ABNORMAL LOW (ref 22–32)
Calcium: 7.9 mg/dL — ABNORMAL LOW (ref 8.9–10.3)
Chloride: 105 mmol/L (ref 98–111)
Creatinine, Ser: 5.3 mg/dL — ABNORMAL HIGH (ref 0.61–1.24)
GFR, Estimated: 10 mL/min — ABNORMAL LOW (ref >60)
Glucose, Bld: 142 mg/dL — ABNORMAL HIGH (ref 70–99)
Potassium: 5 mmol/L (ref 3.5–5.1)
Sodium: 133 mmol/L — ABNORMAL LOW (ref 135–145)

## 2024-02-12 LAB — MAGNESIUM: Magnesium: 2.5 mg/dL — ABNORMAL HIGH (ref 1.7–2.4)

## 2024-02-12 MED ORDER — SODIUM CHLORIDE 0.9 % IV SOLN
1.0000 g | INTRAVENOUS | Status: DC
  Administered 2024-02-12 – 2024-02-19 (×7): 1 g via INTRAVENOUS
  Filled 2024-02-12 (×7): qty 10

## 2024-02-12 MED ORDER — SODIUM BICARBONATE 650 MG PO TABS
650.0000 mg | ORAL_TABLET | Freq: Three times a day (TID) | ORAL | Status: DC
  Administered 2024-02-12 – 2024-02-14 (×7): 650 mg via ORAL
  Filled 2024-02-12 (×7): qty 1

## 2024-02-12 MED ORDER — SODIUM CHLORIDE 0.9 % IV BOLUS
500.0000 mL | Freq: Once | INTRAVENOUS | Status: AC
  Administered 2024-02-12: 500 mL via INTRAVENOUS

## 2024-02-12 MED ORDER — SODIUM CHLORIDE 0.9 % IV SOLN: INTRAVENOUS | Status: DC

## 2024-02-12 NOTE — Progress Notes (Signed)
 Patient very sleepy and weak today, only waking up briefly when spoken to but still oriented and able to speak a full sentence. Urine appearing more cloudy and darker. BP still soft, 1700 coreg held. Discussed with MD, abx changed gabapentin and ambien dc'd for now. Will continue to monitor.

## 2024-02-12 NOTE — Evaluation (Signed)
 Occupational Therapy Evaluation Patient Details Name: Charles Richard MRN: [id number] DOB: February 09, 1930 Today's Date: 02/12/2024   History of Present Illness   89 year old  presents to the ED3/1/25  via EMS with complaints of fever, dysuria, and cough. Prior history of dementia, AAA, permanent A-fib on Eliquis, DM 2, HTN, impaired hearing, CVA, HLD, CKD 3B admitted to the hospital 1/25 - 01/09/2024 for obstructive renal stone requiring emergent cystoscopy and left-sided stent placement.  Eventually discharged on 10 days of oral antibiotics and Foley catheter.     Clinical Impressions Pt presents with decline in function and safety with ADLs and ADL mobility with impaired strength, balance and endurance. PTA pt lives at home with family with 24/7 caregiver assist; has assist with showers from family 3x/wk and able to participate in UB bathing/LB bathing, able to dress himself, uses rollator at baseline, has a lift chair. Pt currently requires min A to sit EOB, max A with LB ADLs, max A with toileting, min A with mobility/transfers using RW. Pt would benefit from acute OT services to address impairment to maximize level of function and safety     If plan is discharge home, recommend the following:   A lot of help with bathing/dressing/bathroom;A little help with walking and/or transfers;Assist for transportation;Direct supervision/assist for medications management;Help with stairs or ramp for entrance     Functional Status Assessment   Patient has had a recent decline in their functional status and demonstrates the ability to make significant improvements in function in a reasonable and predictable amount of time.     Equipment Recommendations   None recommended by OT     Recommendations for Other Services         Precautions/Restrictions   Precautions Precautions: Fall Restrictions Weight Bearing Restrictions Per Provider Order: No     Mobility Bed Mobility Overal bed  mobility: Needs Assistance Bed Mobility: Supine to Sit, Sit to Supine     Supine to sit: Min assist Sit to supine: Min assist   General bed mobility comments: min A with LE mgt and to elevate trunk    Transfers Overall transfer level: Needs assistance Equipment used: Rolling walker (2 wheels) Transfers: Sit to/from Stand Sit to Stand: Min assist, From elevated surface                  Balance Overall balance assessment: Needs assistance Sitting-balance support: No upper extremity supported, Feet supported       Standing balance support: Bilateral upper extremity supported, Reliant on assistive device for balance, During functional activity Standing balance-Leahy Scale: Poor                             ADL either performed or assessed with clinical judgement   ADL Overall ADL's : Needs assistance/impaired Eating/Feeding: Independent   Grooming: Wash/dry hands;Wash/dry face;Contact guard assist;Sitting   Upper Body Bathing: Contact guard assist;Sitting   Lower Body Bathing: Maximal assistance   Upper Body Dressing : Contact guard assist;Sitting   Lower Body Dressing: Maximal assistance   Toilet Transfer: Independent;Rolling walker (2 wheels);Stand-pivot;BSC/3in1;Cueing for safety;Cueing for sequencing   Toileting- Clothing Manipulation and Hygiene: Maximal assistance;Sit to/from stand       Functional mobility during ADLs: Minimal assistance;Rolling walker (2 wheels);Cueing for safety;Cueing for sequencing       Vision Ability to See in Adequate Light: 0 Adequate Patient Visual Report: No change from baseline       Perception  Praxis         Pertinent Vitals/Pain Pain Assessment Pain Assessment: Faces Faces Pain Scale: Hurts a little bit Pain Location: R foot Pain Descriptors / Indicators: Discomfort Pain Intervention(s): Monitored during session, Repositioned     Extremity/Trunk Assessment Upper Extremity  Assessment Upper Extremity Assessment: Generalized weakness   Lower Extremity Assessment Lower Extremity Assessment: Defer to PT evaluation   Cervical / Trunk Assessment Cervical / Trunk Assessment: Kyphotic   Communication Communication Communication: Impaired Factors Affecting Communication: Hearing impaired   Cognition   Behavior During Therapy: WFL for tasks assessed/performed Cognition: No family/caregiver present to determine baseline                               Following commands: Intact       Cueing  General Comments   Cueing Techniques: Verbal cues;Gestural cues      Exercises     Shoulder Instructions      Home Living Family/patient expects to be discharged to:: Private residence Living Arrangements: Children Available Help at Discharge: Family;Available 24 hours/day Type of Home: House Home Access: Ramped entrance     Home Layout: One level     Bathroom Shower/Tub: Producer, television/film/video: Handicapped height Bathroom Accessibility: Yes   Home Equipment: Rollator (4 wheels);BSC/3in1;Transport chair;Rolling Walker (2 wheels);Cane - single point;Shower seat;Hand held shower head;Grab bars - toilet;Grab bars - tub/shower;Hospital bed;Lift chair   Additional Comments: lives with daughter      Prior Functioning/Environment Prior Level of Function : Needs assist             Mobility Comments: uses rollator at baseline, able to get  up to amb without assistance,  has a lift chair ADLs Comments: patient has assist with showers from family 3x/wk and able to participate in UB bathing/LB bathing, able to dress himself, uses rollator at baseline, has a lift chair    OT Problem List: Decreased strength;Impaired balance (sitting and/or standing);Decreased safety awareness;Decreased activity tolerance;Decreased coordination;Decreased knowledge of use of DME or AE;Pain   OT Treatment/Interventions: Self-care/ADL training;DME and/or  AE instruction;Balance training;Therapeutic activities;Therapeutic exercise;Patient/family education      OT Goals(Current goals can be found in the care plan section)   Acute Rehab OT Goals Patient Stated Goal: go home OT Goal Formulation: With patient Time For Goal Achievement: 02/26/24 Potential to Achieve Goals: Good ADL Goals Pt Will Perform Grooming: with supervision;with set-up;sitting Pt Will Perform Upper Body Bathing: with supervision;with set-up;sitting Pt Will Perform Lower Body Bathing: with mod assist;with min assist;with caregiver independent in assisting Pt Will Perform Upper Body Dressing: with supervision;with set-up;sitting Pt Will Perform Lower Body Dressing: with mod assist;with min assist;with caregiver independent in assisting Pt Will Transfer to Toilet: with contact guard assist;with supervision;ambulating Pt Will Perform Toileting - Clothing Manipulation and hygiene: with mod assist;with min assist;sit to/from stand;with caregiver independent in assisting   OT Frequency:  Min 1X/week    Co-evaluation              AM-PAC OT "6 Clicks" Daily Activity     Outcome Measure Help from another person eating meals?: None Help from another person taking care of personal grooming?: A Little Help from another person toileting, which includes using toliet, bedpan, or urinal?: A Lot Help from another person bathing (including washing, rinsing, drying)?: A Lot Help from another person to put on and taking off regular upper body clothing?: A Little Help from another  person to put on and taking off regular lower body clothing?: A Lot 6 Click Score: 16   End of Session Equipment Utilized During Treatment: Gait belt;Rolling walker (2 wheels);Other (comment) (BSC)  Activity Tolerance: Patient tolerated treatment well Patient left: in bed;with call bell/phone within reach;with bed alarm set  OT Visit Diagnosis: Unsteadiness on feet (R26.81);Other abnormalities of gait  and mobility (R26.89);Muscle weakness (generalized) (M62.81)                Time: 1610-9604 OT Time Calculation (min): 26 min Charges:  OT General Charges $OT Visit: 1 Visit OT Evaluation $OT Eval Moderate Complexity: 1 Mod OT Treatments $Therapeutic Activity: 8-22 mins    Galen Manila 02/12/2024, 12:44 PM

## 2024-02-12 NOTE — TOC Progression Note (Signed)
 Transition of Care [facility]) - Progression Note   Patient Details  Name: Charles Richard MRN: [id number] Date of Birth: 12-10-1930  [facility] of [facility] CM/SW Contact  Ewing Schlein, LCSW Phone Number: 02/12/2024, 2:56 PM  Clinical Narrative: CSW spoke with patient's daughter, Charles Richard, regarding PT and OT's recommendations for [facility]. Daughter reported patient is active with [facility] and would like to resume HH with the same agency. Daughter also confirmed patient has an aide with him at home Monday-Friday from 9:00-5:00 while she is at work, but she is home with him on weekends. CSW confirmed with Marylene Land at [facility] that patient is active. HH orders placed by hospitalist. CSW notified Marylene Land of [facility] orders.  Expected Discharge Plan: Home w Home Health Services Barriers to Discharge: Continued Medical Work up  Expected Discharge Plan and Services In-house Referral: Clinical Social Work Post Acute Care Choice: Home Health Living arrangements for the past 2 months: Single Family Home           DME Arranged: N/A DME Agency: NA HH Arranged: PT, OT HH Agency: Other - See comment Producer, television/film/video)  Social Determinants of Health (SDOH) Interventions SDOH Screenings   Food Insecurity: No Food Insecurity (02/10/2024)  Housing: Patient Declined (02/10/2024)  Transportation Needs: No Transportation Needs (02/10/2024)  Utilities: Not At Risk (02/10/2024)  Social Connections: Patient Unable To Answer (02/10/2024)  Tobacco Use: Low Risk  (02/09/2024)   Readmission Risk Interventions    02/10/2024   12:16 PM 01/07/2024   10:49 AM  Readmission Risk Prevention Plan  Transportation Screening Complete Complete  PCP or Specialist Appt within 3-5 Days Complete   HRI or Home Care Consult Complete Complete  Social Work Consult for Recovery Care Planning/Counseling Complete Complete  Palliative Care Screening Not Applicable Not Applicable  Medication Review Oceanographer) Complete Complete

## 2024-02-12 NOTE — Progress Notes (Signed)
 PROGRESS NOTE    Charles SKIDMORE  ZOX:[id number] DOB: 1929-12-29 DOA: 02/09/2024 PCP: [facility]    Brief Narrative:   89 year old with history of dementia, AAA, permanent A-fib on Eliquis, DM 2, HTN, impaired hearing, CVA, HLD, CKD 3B admitted to the hospital 1/25 - 01/09/2024 for obstructive renal stone requiring emergent cystoscopy and left-sided stent placement.  Eventually discharged on 10 days of oral antibiotics and Foley catheter.  Since his prior discharge he has not followed up with outpatient urology and now admitted for urosepsis.  EDP has consulted urology.  Discussed with Dr Mena Goes, requested to keep him on IV abx inptn, and he will perform stent removal inptn on Friday.   Assessment & Plan:  Principal Problem:   Complicated urinary tract infection Active Problems:   Type 2 diabetes mellitus (HCC)   Hyponatremia   Hyperkalemia   Hyperlipidemia   Metabolic acidosis   Sepsis secondary to complicated UTI due to renal stone and Foley catheter CAUTI -Admitted for urosepsis.  Sepsis physiology slowly improving.  Abdominal x-ray shows left ureteral stent in good placement.  Reviewed by urology, no urgent intervention at this point.  Foley catheter replaced by the nurses on 3/2. - Follow culture data.  Continue cefepime and adjust as necessary  Discussed with Dr Mena Goes, requested to keep him on IV abx inptn, and he will perform stent removal inptn on Friday.   Permanent A-fib, intermittent RVR Blood pressure is now better but then due to intermittent RVR will resume home Coreg Eliquis   Chronic kidney disease stage IV Metabolic acidosis.  -For the past several weeks patient's creatinine has been greater than 4, as I had 7.3 back in January.  Follows at the [place]. - Admission creatinine 4.4 > 5.3.  IVF, NaBicarb started. Order Renal US   Hyperkalemia - Improved    Hypomagnesemia P.o. iron repletion    Non-insulin-dependent type 2 diabetes Last A1c 6.5  on 01/05/2024.  Placed on sensitive sliding scale insulin ACHS.   Hypertension Resume Coreg IV as needed   Chronic anemia Hemoglobin stable, monitor labs.   Dementia: Delirium precautions. History of CVA Hyperlipidemia   DVT prophylaxis: apixaban (ELIQUIS) tablet 2.5 mg Start: 02/10/24 1000Eliquis    Code Status: Do not attempt resuscitation (DNR) PRE-ARREST INTERVENTIONS DESIRED Family Communication: Called [name] Status is: Inpatient Remains inpatient appropriate because: Continue hospital stay at least until Saturday per urology.    Subjective: Doing ok, no complaints. Poor po intake.  Ok with procedure later this week.    Examination:  General exam: Appears calm and comfortable, chronically ill-appearing Respiratory system: Clear to auscultation. Respiratory effort normal. Cardiovascular system: S1 & S2 heard, RRR. No JVD, murmurs, rubs, gallops or clicks. No pedal edema. Gastrointestinal system: Abdomen is nondistended, soft and nontender. No organomegaly or masses felt. Normal bowel sounds heard. Central nervous system: Alert and oriented. No focal neurological deficits. Extremities: Symmetric 4x 5 power. Skin: No rashes, lesions or ulcers Psychiatry: Judgement and insight appear normal. Mood & affect appropriate. Foley catheter in place               Diet Orders (From admission, onward)     Start     Ordered   02/10/24 0525  Diet heart healthy/carb modified Room service appropriate? Yes; Fluid consistency: Thin  Diet effective now       Question Answer Comment  Diet-HS Snack? Nothing   Room service appropriate? Yes   Fluid consistency: Thin      02/10/24 0526  Objective: Vitals:   02/12/24 0211 02/12/24 0335 02/12/24 0341 02/12/24 0759  BP: (!) 99/54 (!) 96/55  (!) 119/56  Pulse:    89  Resp: (!) 23 (!) 22 20 19   Temp:  98.5 F (36.9 C)  98.1 F (36.7 C)  TempSrc:  Oral  Oral  SpO2: 100% 100%  100%  Weight:      Height:         Intake/Output Summary (Last 24 hours) at 02/12/2024 1021 Last data filed at 02/12/2024 0210 Gross per 24 hour  Intake 940 ml  Output 700 ml  Net 240 ml   Filed Weights   02/10/24 1200  Weight: 103.5 kg    Scheduled Meds:  apixaban  2.5 mg Oral BID   carvedilol  6.25 mg Oral BID WC   Chlorhexidine Gluconate Cloth  6 each Topical Daily   docusate sodium  100 mg Oral Daily   gabapentin  300 mg Oral QHS   insulin aspart  0-5 Units Subcutaneous QHS   insulin aspart  0-9 Units Subcutaneous TID WC   latanoprost  1 drop Both Eyes QHS   loratadine  10 mg Oral Q48H   simvastatin  10 mg Oral Daily   sodium bicarbonate  650 mg Oral TID   Continuous Infusions:  sodium chloride 75 mL/hr at 02/12/24 0930   ceFEPime (MAXIPIME) IV 2 g (02/11/24 2212)    Nutritional status     Body mass index is 30.95 kg/m.  Data Reviewed:   CBC: Recent Labs  Lab 02/08/24 0940 02/09/24 2157 02/10/24 0606 02/11/24 0420 02/12/24 0416  WBC 11.8* 12.6* 10.3 11.5* 15.4*  NEUTROABS  --  9.6*  --   --   --   HGB 10.1* 10.8* 9.4* 9.4* 9.5*  HCT 32.7* 35.7* 29.5* 30.0* 31.7*  MCV 99.4 102.0* 99.7 98.0 103.6*  PLT 329 369 320 334 300   Basic Metabolic Panel: Recent Labs  Lab 02/08/24 0940 02/09/24 2157 02/10/24 0606 02/11/24 0420 02/12/24 0416  NA 135 131* 134* 136 133*  K 5.9* 6.0* 5.4* 5.1 5.0  CL 107 103 107 108 105  CO2 17* 17* 17* 17* 14*  GLUCOSE 126* 146* 111* 120* 142*  BUN 68* 75* 72* 74* 77*  CREATININE 4.20* 4.46* 4.49* 4.35* 5.30*  CALCIUM 8.8* 8.5* 8.4* 8.4* 7.9*  MG  --   --   --  1.6* 2.5*  PHOS  --   --   --  4.4  --    GFR: Estimated Creatinine Clearance: 11.3 mL/min (A) (by C-G formula based on SCr of 5.3 mg/dL (H)). Liver Function Tests: Recent Labs  Lab 02/09/24 2157 02/10/24 0606  AST 61* 38  ALT 53* 43  ALKPHOS 46 34*  BILITOT 0.3 0.6  PROT 7.5 6.3*  ALBUMIN 2.7* 2.2*   No results for input(s): "LIPASE", "AMYLASE" in the last 168 hours. No results for  input(s): "AMMONIA" in the last 168 hours. Coagulation Profile: Recent Labs  Lab 02/09/24 2157  INR 1.3*   Cardiac Enzymes: No results for input(s): "CKTOTAL", "CKMB", "CKMBINDEX", "TROPONINI" in the last 168 hours. BNP (last 3 results) No results for input(s): "PROBNP" in the last 8760 hours. HbA1C: No results for input(s): "HGBA1C" in the last 72 hours. CBG: Recent Labs  Lab 02/11/24 1126 02/11/24 1700 02/11/24 2246 02/12/24 0200 02/12/24 0758  GLUCAP 127* 162* 175* 135* 171*   Lipid Profile: No results for input(s): "CHOL", "HDL", "LDLCALC", "TRIG", "CHOLHDL", "LDLDIRECT" in the last 72 hours. Thyroid  Function Tests: No results for input(s): "TSH", "T4TOTAL", "FREET4", "T3FREE", "THYROIDAB" in the last 72 hours. Anemia Panel: No results for input(s): "VITAMINB12", "FOLATE", "FERRITIN", "TIBC", "IRON", "RETICCTPCT" in the last 72 hours. Sepsis Labs: Recent Labs  Lab 02/09/24 2203 02/10/24 0618  LATICACIDVEN 2.1* 0.6    Recent Results (from the past 240 hours)  Urine Culture     Status: None   Collection Time: 02/09/24 12:00 AM   Specimen: Urine, Random  Result Value Ref Range Status   Specimen Description   Final    URINE, RANDOM Performed at [facility], [address] [place], [place] [address]    Special Requests   Final    NONE Reflexed from [phone] Performed at [facility], [address], [place], [place] [address]    Culture   Final    NO GROWTH Performed at [facility], [address] [place], [place] [address]    Report Status 02/11/2024 FINAL  Final  Culture, blood (Routine x 2)     Status: None (Preliminary result)   Collection Time: 02/09/24  9:54 PM   Specimen: BLOOD RIGHT FOREARM  Result Value Ref Range Status   Specimen Description   Final    BLOOD RIGHT FOREARM Performed at [facility], [address] [place], [place] [address]    Special Requests   Final    BOTTLES DRAWN AEROBIC  AND ANAEROBIC Blood Culture results may not be optimal due to an inadequate volume of blood received in culture bottles Performed at [facility], [address], [place], [place] [address]    Culture   Final    NO GROWTH 2 DAYS Performed at [facility], [address], [place], [place] [address]    Report Status PENDING  Incomplete  Culture, blood (Routine x 2)     Status: None (Preliminary result)   Collection Time: 02/09/24 10:17 PM   Specimen: BLOOD LEFT FOREARM  Result Value Ref Range Status   Specimen Description   Final    BLOOD LEFT FOREARM Performed at [facility], [address] [place], [place] [address]    Special Requests   Final    BOTTLES DRAWN AEROBIC AND ANAEROBIC Blood Culture results may not be optimal due to an inadequate volume of blood received in culture bottles Performed at [facility], [address], [place], [place] [address]    Culture   Final    NO GROWTH 2 DAYS Performed at [facility], [address] [place], [place] [address]    Report Status PENDING  Incomplete  Urine Culture     Status: None   Collection Time: 02/09/24 10:53 PM   Specimen: Urine, Catheterized  Result Value Ref Range Status   Specimen Description   Final    URINE, CATHETERIZED Performed at [facility], [address], [place], [place] [address]    Special Requests   Final    NONE Performed at [facility], [address] [place], [place] [address]    Culture   Final    NO GROWTH Performed at [facility], [address] [place], [place] [address]    Report Status 02/11/2024 FINAL  Final  Resp panel by RT-PCR (RSV, Flu A&B, Covid) Anterior Nasal Swab     Status: None   Collection Time: 02/09/24 10:55 PM   Specimen: Anterior Nasal Swab  Result Value  Ref Range Status   SARS Coronavirus 2 by RT PCR NEGATIVE NEGATIVE Final    Comment: (NOTE) SARS-CoV-2 target nucleic  acids are NOT DETECTED.  The SARS-CoV-2 RNA is generally detectable in upper respiratory specimens during the acute phase of infection. The lowest concentration of SARS-CoV-2 viral copies this assay can detect is 138 copies/mL. A negative result does not preclude SARS-Cov-2 infection and should not be used as the sole basis for treatment or other patient management decisions. A negative result may occur with  improper specimen collection/handling, submission of specimen other than nasopharyngeal swab, presence of viral mutation(s) within the areas targeted by this assay, and inadequate number of viral copies(<138 copies/mL). A negative result must be combined with clinical observations, patient history, and epidemiological information. The expected result is Negative.  Fact Sheet for Patients:  BloggerCourse.com  Fact Sheet for Healthcare Providers:  SeriousBroker.it  This test is no t yet approved or cleared by the [place] FDA and  has been authorized for detection and/or diagnosis of SARS-CoV-2 by FDA under an Emergency Use Authorization (EUA). This EUA will remain  in effect (meaning this test can be used) for the duration of the COVID-19 declaration under Section 564(b)(1) of the Act, 21 U.S.C.section 360bbb-3(b)(1), unless the authorization is terminated  or revoked sooner.       Influenza A by PCR NEGATIVE NEGATIVE Final   Influenza B by PCR NEGATIVE NEGATIVE Final    Comment: (NOTE) The Xpert Xpress SARS-CoV-2/FLU/RSV plus assay is intended as an aid in the diagnosis of influenza from Nasopharyngeal swab specimens and should not be used as a sole basis for treatment. Nasal washings and aspirates are unacceptable for Xpert Xpress SARS-CoV-2/FLU/RSV testing.  Fact Sheet for Patients: BloggerCourse.com  Fact Sheet for Healthcare Providers: SeriousBroker.it  This  test is not yet approved or cleared by the [place] FDA and has been authorized for detection and/or diagnosis of SARS-CoV-2 by FDA under an Emergency Use Authorization (EUA). This EUA will remain in effect (meaning this test can be used) for the duration of the COVID-19 declaration under Section 564(b)(1) of the Act, 21 U.S.C. section 360bbb-3(b)(1), unless the authorization is terminated or revoked.     Resp Syncytial Virus by PCR NEGATIVE NEGATIVE Final    Comment: (NOTE) Fact Sheet for Patients: BloggerCourse.com  Fact Sheet for Healthcare Providers: SeriousBroker.it  This test is not yet approved or cleared by the [place] FDA and has been authorized for detection and/or diagnosis of SARS-CoV-2 by FDA under an Emergency Use Authorization (EUA). This EUA will remain in effect (meaning this test can be used) for the duration of the COVID-19 declaration under Section 564(b)(1) of the Act, 21 U.S.C. section 360bbb-3(b)(1), unless the authorization is terminated or revoked.  Performed at [facility], [address] [place], [place] [address]          Radiology Studies: No results found.         LOS: 2 days   Time spent= 35 mins    Miguel Rota, MD [facility]  If 7PM-7AM, please contact night-coverage  02/12/2024, 10:21 AM

## 2024-02-13 LAB — CBC
HCT: 29.1 % — ABNORMAL LOW (ref 39.0–52.0)
Hemoglobin: 8.8 g/dL — ABNORMAL LOW (ref 13.0–17.0)
MCH: 30.7 pg (ref 26.0–34.0)
MCHC: 30.2 g/dL (ref 30.0–36.0)
MCV: 101.4 fL — ABNORMAL HIGH (ref 80.0–100.0)
Platelets: 292 10*3/uL (ref 150–400)
RBC: 2.87 MIL/uL — ABNORMAL LOW (ref 4.22–5.81)
RDW: 12.2 % (ref 11.5–15.5)
WBC: 10.3 10*3/uL (ref 4.0–10.5)
nRBC: 0 % (ref 0.0–0.2)

## 2024-02-13 LAB — BASIC METABOLIC PANEL
Anion gap: 11 (ref 5–15)
BUN: 78 mg/dL — ABNORMAL HIGH (ref 8–23)
CO2: 15 mmol/L — ABNORMAL LOW (ref 22–32)
Calcium: 8 mg/dL — ABNORMAL LOW (ref 8.9–10.3)
Chloride: 109 mmol/L (ref 98–111)
Creatinine, Ser: 5.15 mg/dL — ABNORMAL HIGH (ref 0.61–1.24)
GFR, Estimated: 10 mL/min — ABNORMAL LOW (ref >60)
Glucose, Bld: 106 mg/dL — ABNORMAL HIGH (ref 70–99)
Potassium: 4.5 mmol/L (ref 3.5–5.1)
Sodium: 135 mmol/L (ref 135–145)

## 2024-02-13 LAB — GLUCOSE, CAPILLARY
Glucose-Capillary: 151 mg/dL — ABNORMAL HIGH (ref 70–99)
Glucose-Capillary: 118 mg/dL — ABNORMAL HIGH (ref 70–99)
Glucose-Capillary: 125 mg/dL — ABNORMAL HIGH (ref 70–99)
Glucose-Capillary: 200 mg/dL — ABNORMAL HIGH (ref 70–99)

## 2024-02-13 LAB — AMMONIA: Ammonia: 35 umol/L (ref 9–35)

## 2024-02-13 LAB — MAGNESIUM: Magnesium: 2.3 mg/dL (ref 1.7–2.4)

## 2024-02-13 MED ORDER — SODIUM CHLORIDE 0.9 % IV SOLN: INTRAVENOUS | Status: DC

## 2024-02-13 NOTE — Progress Notes (Signed)
 PROGRESS NOTE    Charles Richard  WUJ:[id number] DOB: 21-Oct-1930 DOA: 02/09/2024 PCP: [facility]    Brief Narrative:   89 year old with history of dementia, AAA, permanent A-fib on Eliquis, DM 2, HTN, impaired hearing, CVA, HLD, CKD 3B admitted to the hospital 1/25 - 01/09/2024 for obstructive renal stone requiring emergent cystoscopy and left-sided stent placement.  Eventually discharged on 10 days of oral antibiotics and Foley catheter.  Since his prior discharge he has not followed up with outpatient urology and now admitted for urosepsis.  EDP has consulted urology.  Discussed with Dr Mena Goes, requested to keep him on IV abx inptn, and he will perform stent removal inptn on Friday.   Assessment & Plan:  Principal Problem:   Complicated urinary tract infection Active Problems:   Type 2 diabetes mellitus (HCC)   Hyponatremia   Hyperkalemia   Hyperlipidemia   Metabolic acidosis   Sepsis secondary to complicated UTI due to renal stone and Foley catheter CAUTI -Admitted for urosepsis.  Sepsis physiology slowly improving.  Abdominal x-ray shows left ureteral stent in good placement.  Reviewed by urology, no urgent intervention at this point.  Foley catheter replaced by the nurses on 3/2. - Cultures are negative.  Transition to Rocephin  Discussed with Dr Mena Goes, requested to keep him on IV abx, and he will perform stent removal inptn on Friday.   Permanent A-fib, intermittent RVR Coreg, Eliquis   Chronic kidney disease stage IV Metabolic acidosis.  -For the past several weeks patient's creatinine has been greater than 4, as I had 7.3 back in January.  Follows at the [place]. - Admission creatinine 4.4 > 5.3 > 5.15.  IV fluids, sodium bicarb. -Renal ultrasound shows mild bilateral hydronephrosis otherwise unremarkable - Due to drowsiness we will discontinue Ambien and gabapentin for now   Hyperkalemi - Improved    Hypomagnesemia As needed repletion     Non-insulin-dependent type 2 diabetes Last A1c 6.5 on 01/05/2024.  Placed on sensitive sliding scale insulin ACHS.   Hypertension Resume Coreg IV as needed   Chronic anemia Hemoglobin stable, monitor labs.   Dementia: Delirium precautions. History of CVA Hyperlipidemia   DVT prophylaxis: apixaban (ELIQUIS) tablet 2.5 mg Start: 02/10/24 1000Eliquis    Code Status: Do not attempt resuscitation (DNR) PRE-ARREST INTERVENTIONS DESIRED Family Communication: Called [name] Status is: Inpatient Remains inpatient appropriate because: Continue hospital stay at least until Saturday per urology.    Subjective: Little bit more awake this morning.  Poor oral intake. Remains afebrile  Examination:  General exam: Appears calm and comfortable, chronically ill-appearing Respiratory system: Clear to auscultation. Respiratory effort normal. Cardiovascular system: S1 & S2 heard, RRR. No JVD, murmurs, rubs, gallops or clicks. No pedal edema. Gastrointestinal system: Abdomen is nondistended, soft and nontender. No organomegaly or masses felt. Normal bowel sounds heard. Central nervous system: Alert and oriented. No focal neurological deficits. Extremities: Symmetric 4x 5 power. Skin: No rashes, lesions or ulcers Psychiatry: Judgement and insight appear normal. Mood & affect appropriate. Foley catheter in place               Diet Orders (From admission, onward)     Start     Ordered   02/10/24 0525  Diet heart healthy/carb modified Room service appropriate? Yes; Fluid consistency: Thin  Diet effective now       Question Answer Comment  Diet-HS Snack? Nothing   Room service appropriate? Yes   Fluid consistency: Thin      02/10/24 0526  Objective: Vitals:   02/12/24 1400 02/12/24 1700 02/12/24 2026 02/13/24 0527  BP: 101/63 (!) 105/58 (!) 99/54 113/78  Pulse: 81  92 83  Resp: 18 18 20 18   Temp:   98.3 F (36.8 C) 98.2 F (36.8 C)  TempSrc:   Oral Oral  SpO2:  100% 100% 93% 99%  Weight:      Height:        Intake/Output Summary (Last 24 hours) at 02/13/2024 1129 Last data filed at 02/13/2024 0500 Gross per 24 hour  Intake 2063.23 ml  Output 1525 ml  Net 538.23 ml   Filed Weights   02/10/24 1200  Weight: 103.5 kg    Scheduled Meds:  apixaban  2.5 mg Oral BID   carvedilol  6.25 mg Oral BID WC   Chlorhexidine Gluconate Cloth  6 each Topical Daily   docusate sodium  100 mg Oral Daily   insulin aspart  0-5 Units Subcutaneous QHS   insulin aspart  0-9 Units Subcutaneous TID WC   latanoprost  1 drop Both Eyes QHS   loratadine  10 mg Oral Q48H   simvastatin  10 mg Oral Daily   sodium bicarbonate  650 mg Oral TID   Continuous Infusions:  sodium chloride 75 mL/hr at 02/13/24 0935   cefTRIAXone (ROCEPHIN)  IV 200 mL/hr at 02/12/24 1729    Nutritional status     Body mass index is 30.95 kg/m.  Data Reviewed:   CBC: Recent Labs  Lab 02/09/24 2157 02/10/24 0606 02/11/24 0420 02/12/24 0416 02/13/24 0357  WBC 12.6* 10.3 11.5* 15.4* 10.3  NEUTROABS 9.6*  --   --   --   --   HGB 10.8* 9.4* 9.4* 9.5* 8.8*  HCT 35.7* 29.5* 30.0* 31.7* 29.1*  MCV 102.0* 99.7 98.0 103.6* 101.4*  PLT 369 320 334 300 292   Basic Metabolic Panel: Recent Labs  Lab 02/09/24 2157 02/10/24 0606 02/11/24 0420 02/12/24 0416 02/13/24 0357  NA 131* 134* 136 133* 135  K 6.0* 5.4* 5.1 5.0 4.5  CL 103 107 108 105 109  CO2 17* 17* 17* 14* 15*  GLUCOSE 146* 111* 120* 142* 106*  BUN 75* 72* 74* 77* 78*  CREATININE 4.46* 4.49* 4.35* 5.30* 5.15*  CALCIUM 8.5* 8.4* 8.4* 7.9* 8.0*  MG  --   --  1.6* 2.5* 2.3  PHOS  --   --  4.4  --   --    GFR: Estimated Creatinine Clearance: 11.6 mL/min (A) (by C-G formula based on SCr of 5.15 mg/dL (H)). Liver Function Tests: Recent Labs  Lab 02/09/24 2157 02/10/24 0606  AST 61* 38  ALT 53* 43  ALKPHOS 46 34*  BILITOT 0.3 0.6  PROT 7.5 6.3*  ALBUMIN 2.7* 2.2*   No results for input(s): "LIPASE", "AMYLASE" in  the last 168 hours. Recent Labs  Lab 02/13/24 0357  AMMONIA 35   Coagulation Profile: Recent Labs  Lab 02/09/24 2157  INR 1.3*   Cardiac Enzymes: No results for input(s): "CKTOTAL", "CKMB", "CKMBINDEX", "TROPONINI" in the last 168 hours. BNP (last 3 results) No results for input(s): "PROBNP" in the last 8760 hours. HbA1C: No results for input(s): "HGBA1C" in the last 72 hours. CBG: Recent Labs  Lab 02/12/24 0758 02/12/24 1208 02/12/24 1715 02/12/24 2023 02/13/24 0748  GLUCAP 171* 139* 93 219* 151*   Lipid Profile: No results for input(s): "CHOL", "HDL", "LDLCALC", "TRIG", "CHOLHDL", "LDLDIRECT" in the last 72 hours. Thyroid Function Tests: No results for input(s): "TSH", "T4TOTAL", "FREET4", "T3FREE", "  THYROIDAB" in the last 72 hours. Anemia Panel: No results for input(s): "VITAMINB12", "FOLATE", "FERRITIN", "TIBC", "IRON", "RETICCTPCT" in the last 72 hours. Sepsis Labs: Recent Labs  Lab 02/09/24 2203 02/10/24 0618  LATICACIDVEN 2.1* 0.6    Recent Results (from the past 240 hours)  Urine Culture     Status: None   Collection Time: 02/09/24 12:00 AM   Specimen: Urine, Random  Result Value Ref Range Status   Specimen Description   Final    URINE, RANDOM Performed at [facility], [address] [place], [place] [address]    Special Requests   Final    NONE Reflexed from [phone] Performed at [facility], [address], [place], [place] [address]    Culture   Final    NO GROWTH Performed at [facility], [address] [place], [place] [address]    Report Status 02/11/2024 FINAL  Final  Culture, blood (Routine x 2)     Status: None (Preliminary result)   Collection Time: 02/09/24  9:54 PM   Specimen: BLOOD RIGHT FOREARM  Result Value Ref Range Status   Specimen Description   Final    BLOOD RIGHT FOREARM Performed at [facility], [address], [place], [place] [address]    Special Requests   Final     BOTTLES DRAWN AEROBIC AND ANAEROBIC Blood Culture results may not be optimal due to an inadequate volume of blood received in culture bottles Performed at [facility], [address], [place], [place] [address]    Culture   Final    NO GROWTH 3 DAYS Performed at [facility], [address], [place], [place] [address]    Report Status PENDING  Incomplete  Culture, blood (Routine x 2)     Status: None (Preliminary result)   Collection Time: 02/09/24 10:17 PM   Specimen: BLOOD LEFT FOREARM  Result Value Ref Range Status   Specimen Description   Final    BLOOD LEFT FOREARM Performed at [facility], [address], [place], [place] [address]    Special Requests   Final    BOTTLES DRAWN AEROBIC AND ANAEROBIC Blood Culture results may not be optimal due to an inadequate volume of blood received in culture bottles Performed at [facility], [address] [place], [place] [address]    Culture   Final    NO GROWTH 3 DAYS Performed at [facility], [address], [place], [place] [address]    Report Status PENDING  Incomplete  Urine Culture     Status: None   Collection Time: 02/09/24 10:53 PM   Specimen: Urine, Catheterized  Result Value Ref Range Status   Specimen Description   Final    URINE, CATHETERIZED Performed at [facility], [address], [place], [place] [address]    Special Requests   Final    NONE Performed at [facility], [address], [place], [place] [address]    Culture   Final    NO GROWTH Performed at [facility], [address] [place], [place] [address]    Report Status 02/11/2024 FINAL  Final  Resp panel by RT-PCR (RSV, Flu A&B, Covid) Anterior Nasal Swab     Status: None   Collection Time: 02/09/24 10:55 PM   Specimen: Anterior Nasal Swab  Result Value Ref Range Status   SARS Coronavirus 2 by RT  PCR NEGATIVE NEGATIVE Final    Comment:  (NOTE) SARS-CoV-2 target nucleic acids are NOT DETECTED.  The SARS-CoV-2 RNA is generally detectable in upper respiratory specimens during the acute phase of infection. The lowest concentration of SARS-CoV-2 viral copies this assay can detect is 138 copies/mL. A negative result does not preclude SARS-Cov-2 infection and should not be used as the sole basis for treatment or other patient management decisions. A negative result may occur with  improper specimen collection/handling, submission of specimen other than nasopharyngeal swab, presence of viral mutation(s) within the areas targeted by this assay, and inadequate number of viral copies(<138 copies/mL). A negative result must be combined with clinical observations, patient history, and epidemiological information. The expected result is Negative.  Fact Sheet for Patients:  BloggerCourse.com  Fact Sheet for Healthcare Providers:  SeriousBroker.it  This test is no t yet approved or cleared by the [place] FDA and  has been authorized for detection and/or diagnosis of SARS-CoV-2 by FDA under an Emergency Use Authorization (EUA). This EUA will remain  in effect (meaning this test can be used) for the duration of the COVID-19 declaration under Section 564(b)(1) of the Act, 21 U.S.C.section 360bbb-3(b)(1), unless the authorization is terminated  or revoked sooner.       Influenza A by PCR NEGATIVE NEGATIVE Final   Influenza B by PCR NEGATIVE NEGATIVE Final    Comment: (NOTE) The Xpert Xpress SARS-CoV-2/FLU/RSV plus assay is intended as an aid in the diagnosis of influenza from Nasopharyngeal swab specimens and should not be used as a sole basis for treatment. Nasal washings and aspirates are unacceptable for Xpert Xpress SARS-CoV-2/FLU/RSV testing.  Fact Sheet for Patients: BloggerCourse.com  Fact Sheet for Healthcare  Providers: SeriousBroker.it  This test is not yet approved or cleared by the [place] FDA and has been authorized for detection and/or diagnosis of SARS-CoV-2 by FDA under an Emergency Use Authorization (EUA). This EUA will remain in effect (meaning this test can be used) for the duration of the COVID-19 declaration under Section 564(b)(1) of the Act, 21 U.S.C. section 360bbb-3(b)(1), unless the authorization is terminated or revoked.     Resp Syncytial Virus by PCR NEGATIVE NEGATIVE Final    Comment: (NOTE) Fact Sheet for Patients: BloggerCourse.com  Fact Sheet for Healthcare Providers: SeriousBroker.it  This test is not yet approved or cleared by the [place] FDA and has been authorized for detection and/or diagnosis of SARS-CoV-2 by FDA under an Emergency Use Authorization (EUA). This EUA will remain in effect (meaning this test can be used) for the duration of the COVID-19 declaration under Section 564(b)(1) of the Act, 21 U.S.C. section 360bbb-3(b)(1), unless the authorization is terminated or revoked.  Performed at [facility], [address], [place], [place] [address]          Radiology Studies: US RENAL Result Date: 02/12/2024 CLINICAL DATA:  Acute kidney injury EXAM: RENAL / URINARY TRACT ULTRASOUND COMPLETE COMPARISON:  CT 01/05/2024 FINDINGS: Right Kidney: Renal measurements: 11.9 x 6 x 6.8 cm = volume: 252 mL. Echogenicity within normal limits. Mild right hydronephrosis. Cysts measuring up to 4.4 cm at the upper pole for which no imaging follow-up is recommended. Left Kidney: Renal measurements: 11.8 x 5.7 x 6 cm = volume: 213.3 mL. Poorly visible. Echogenicity probably within normal limits. Mild left hydronephrosis. No discrete mass by sonography. Bladder: Decompressed by catheter Other: None. IMPRESSION: 1. There is mild bilateral hydronephrosis 2. Right renal  cysts for which no imaging follow-up is  recommended Electronically Signed   By: Jasmine Pang M.D.   On: 02/12/2024 19:39           LOS: 3 days   Time spent= 35 mins    Miguel Rota, MD [facility]  If 7PM-7AM, please contact night-coverage  02/13/2024, 11:29 AM

## 2024-02-13 NOTE — Consult Note (Signed)
 Consultation: Left ureteral stone, left renal stone, bladder stone, UTI Requested by: Dr. Stephania Fragmin  History of Present Illness: Charles Richard is a 89 year old male patient of mine.  He was seen in 2022 with bilateral ureteral stones and scheduled for ureteroscopy but family canceled given patient's frailty.  He then presented in January with concern for UTI, left ureteral stone and left hydro on CT.  2 small bladder stones.  The right ureteral stone had passed. He was urgently stented on the left.  He then developed urinary retention and a Foley was placed.  His prostate is not large on the CT scan.  Planning definitive ureteroscopy Friday, 02/15/2024 the patient became febrile.  He was admitted and placed on IV antibiotics.  He is doing better.  Dr. Dr. Nelson Chimes and he switched patient to Rocephin.  Held Ambien.  Patient more alert today.  White count 10.5, creatinine 5.15 and was as high as 6.6.  He does have bilateral renal atrophy on the CT.  Ultrasound 02/12/2024 with mild bilateral hydronephrosis, no ureteral stone on the right on CT. His blood and urine cultures have been negative.  His urine culture from the office was negative.  He is in bed and was just bathed and changed.  Eating a sandwich.  Visiting with daughter.  Past Medical History:  Diagnosis Date   AAA (abdominal aortic aneurysm) without rupture (HCC) 06/03/2017   Arthritis    Atrial fibrillation with rapid ventricular response (HCC) 04/2017   New diagnosis during admission for urosepsis secondary to left nephrolithiasis/ureteral stone with hydronephrosis   Cancer (HCC)    melanoma excised from top of head   COVID-19    04/2020   Dementia (HCC)    Diabetes mellitus without complication (HCC)    Dysrhythmia    a fib   Essential hypertension    Hard of hearing    History of CVA (cerebrovascular accident) without residual deficits 05/2009   Admitted with expressive aphasia & confusion following a neurocardiogenic syncope event (related to  dehydration, AKD & BB related hypotension/bradycardia)   History of kidney stones    Hyperlipidemia    Left nephrolithiasis 04/2017   s/p L Ureteral stent for L hydronephrosis & UTI/Urosepsis.     Myocardial infarction (HCC)    Sepsis (HCC)    Skin ulcers of foot, bilateral (HCC)    Stroke (HCC)    mild stroke no residuals   Past Surgical History:  Procedure Laterality Date   CHOLECYSTECTOMY     CYSTOSCOPY W/ URETERAL STENT PLACEMENT Left 05/09/2017   Procedure: CYSTOSCOPY WITH RETROGRADE PYELOGRAM/URETERAL STENT PLACEMENT;  Surgeon: Jerilee Field, MD;  Location: [facility];  Service: Urology;  Laterality: Left;   CYSTOSCOPY W/ URETERAL STENT PLACEMENT Left 01/05/2024   Procedure: CYSTOSCOPY WITH RETROGRADE PYELOGRAM/URETERAL STENT PLACEMENT;  Surgeon: Malen Gauze, MD;  Location: [facility];  Service: Urology;  Laterality: Left;   CYSTOSCOPY WITH RETROGRADE PYELOGRAM, URETEROSCOPY AND STENT PLACEMENT Left 06/08/2017   Procedure: CYSTOSCOPY WITH LEFT RETROGRADE PYELOGRAM, URETEROSCOPY HOLMIUM LASER LITHOTRIPSY BASKET EXTRACTION  AND STENT EXCHANGE;  Surgeon: Jerilee Field, MD;  Location: [facility];  Service: Urology;  Laterality: Left;   EYE SURGERY     right   HOLMIUM LASER APPLICATION Left 06/08/2017   Procedure: HOLMIUM LASER APPLICATION;  Surgeon: Jerilee Field, MD;  Location: [facility];  Service: Urology;  Laterality: Left;   LITHOTRIPSY     REVERSE SHOULDER ARTHROPLASTY Right 07/20/2017   Procedure: REVERSE RIGHT SHOULDER ARTHROPLASTY;  Surgeon: Yolonda Kida, MD;  Location: [facility];  Service: Orthopedics;  Laterality: Right;  150 mins    Home Medications:  Medications Prior to Admission  Medication Sig Dispense Refill Last Dose/Taking   apixaban (ELIQUIS) 5 MG TABS tablet Take 2.5 mg by mouth 2 (two) times daily.   02/09/2024 at 11:00 AM   carvedilol (COREG) 6.25 MG tablet Take 1 tablet (6.25 mg total) by mouth 2 (two) times daily with a meal. (Patient taking differently:  Take 6.25 mg by mouth daily.) 60 tablet 1 02/09/2024   Docusate Calcium (STOOL SOFTENER PO) Take 1 tablet by mouth daily.   02/09/2024   gabapentin (NEURONTIN) 300 MG capsule Take 1 capsule (300 mg total) by mouth at bedtime.   Past Week   Glucerna (GLUCERNA) LIQD Take 237 mLs by mouth daily.   02/09/2024   latanoprost (XALATAN) 0.005 % ophthalmic solution Place 1 drop into both eyes at bedtime.   Past Week   lisinopril (ZESTRIL) 20 MG tablet Take 20 mg by mouth daily.   02/09/2024   loratadine (CLARITIN) 10 MG tablet Take 10 mg by mouth daily with breakfast.    02/09/2024   meclizine (ANTIVERT) 25 MG tablet Take 25 mg by mouth daily.   02/09/2024   Multiple Vitamin (MULTIVITAMIN WITH MINERALS) TABS tablet Take 1 tablet by mouth daily.   02/09/2024   mupirocin ointment (BACTROBAN) 2 % Apply 1 Application topically 2 (two) times daily.   02/09/2024   nystatin-triamcinolone (MYCOLOG II) cream Apply 1 Application topically as needed (sores).   Taking As Needed   ondansetron (ZOFRAN) 8 MG tablet Take 8 mg by mouth daily as needed for nausea or vomiting.   Taking As Needed   Propylene Glycol 0.6 % SOLN Place 2 drops into both eyes daily as needed (dry eyes).   Taking As Needed   simvastatin (ZOCOR) 20 MG tablet Take 10 mg by mouth daily.   02/09/2024   SITagliptin (ZITUVIO) 25 MG TABS Take 25 mg by mouth daily.   02/09/2024   traMADol (ULTRAM) 50 MG tablet Take 50 mg by mouth at bedtime as needed for moderate pain (pain score 4-6).   Past Week   VITAMIN D PO Take 1 capsule by mouth daily.   02/09/2024   zolpidem (AMBIEN) 10 MG tablet Take 10 mg by mouth at bedtime.   Past Week   Allergies: No Known Allergies  Family History  Problem Relation Age of Onset   Lung cancer Father        black lung   CAD Neg Hx    Kidney Stones Neg Hx    Aneurysm Neg Hx    Social History:  reports that he has never smoked. He has never used smokeless tobacco. He reports that he does not drink alcohol and does not use drugs.  ROS: A  complete review of systems was performed.  All systems are negative except for pertinent findings as noted. Review of Systems  All other systems reviewed and are negative.    Physical Exam:  Vital signs in last 24 hours: Temp:  [98.2 F (36.8 C)-98.3 F (36.8 C)] 98.2 F (36.8 C) (03/05 0527) Pulse Rate:  [81-92] 83 (03/05 0527) Resp:  [18-20] 18 (03/05 0527) BP: (99-113)/(54-78) 113/78 (03/05 0527) SpO2:  [93 %-100 %] 99 % (03/05 0527) General:  Alert and oriented, No acute distress HEENT: Normocephalic, atraumatic Cardiovascular: Regular rate and rhythm Lungs: Regular rate and effort Abdomen: Soft, nontender, nondistended, no abdominal masses Back: No CVA tenderness Extremities: No edema  Neurologic: Grossly intact  Laboratory Data:  Results for orders placed or performed during the hospital encounter of 02/09/24 (from the past 24 hours)  Glucose, capillary     Status: Abnormal   Collection Time: 02/12/24 12:08 PM  Result Value Ref Range   Glucose-Capillary 139 (H) 70 - 99 mg/dL  Glucose, capillary     Status: None   Collection Time: 02/12/24  5:15 PM  Result Value Ref Range   Glucose-Capillary 93 70 - 99 mg/dL  Glucose, capillary     Status: Abnormal   Collection Time: 02/12/24  8:23 PM  Result Value Ref Range   Glucose-Capillary 219 (H) 70 - 99 mg/dL  Basic metabolic panel     Status: Abnormal   Collection Time: 02/13/24  3:57 AM  Result Value Ref Range   Sodium 135 135 - 145 mmol/L   Potassium 4.5 3.5 - 5.1 mmol/L   Chloride 109 98 - 111 mmol/L   CO2 15 (L) 22 - 32 mmol/L   Glucose, Bld 106 (H) 70 - 99 mg/dL   BUN 78 (H) 8 - 23 mg/dL   Creatinine, Ser 4.54 (H) 0.61 - 1.24 mg/dL   Calcium 8.0 (L) 8.9 - 10.3 mg/dL   GFR, Estimated 10 (L) >60 mL/min   Anion gap 11 5 - 15  CBC     Status: Abnormal   Collection Time: 02/13/24  3:57 AM  Result Value Ref Range   WBC 10.3 4.0 - 10.5 K/uL   RBC 2.87 (L) 4.22 - 5.81 MIL/uL   Hemoglobin 8.8 (L) 13.0 - 17.0 g/dL    HCT 09.8 (L) 11.9 - 52.0 %   MCV 101.4 (H) 80.0 - 100.0 fL   MCH 30.7 26.0 - 34.0 pg   MCHC 30.2 30.0 - 36.0 g/dL   RDW 14.7 82.9 - 56.2 %   Platelets 292 150 - 400 K/uL   nRBC 0.0 0.0 - 0.2 %  Magnesium     Status: None   Collection Time: 02/13/24  3:57 AM  Result Value Ref Range   Magnesium 2.3 1.7 - 2.4 mg/dL  Ammonia     Status: None   Collection Time: 02/13/24  3:57 AM  Result Value Ref Range   Ammonia 35 9 - 35 umol/L  Glucose, capillary     Status: Abnormal   Collection Time: 02/13/24  7:48 AM  Result Value Ref Range   Glucose-Capillary 151 (H) 70 - 99 mg/dL   Recent Results (from the past 240 hours)  Urine Culture     Status: None   Collection Time: 02/09/24 12:00 AM   Specimen: Urine, Random  Result Value Ref Range Status   Specimen Description   Final    URINE, RANDOM Performed at [facility], [address] [place], [place] [address]    Special Requests   Final    NONE Reflexed from [id number] Performed at [facility], [address], [place], [place] [address]    Culture   Final    NO GROWTH Performed at [facility], [address], [place], [place] [address]    Report Status 02/11/2024 FINAL  Final  Culture, blood (Routine x 2)     Status: None (Preliminary result)   Collection Time: 02/09/24  9:54 PM   Specimen: BLOOD RIGHT FOREARM  Result Value Ref Range Status   Specimen Description   Final    BLOOD RIGHT FOREARM Performed at [facility], [address]  [place], [place] [address]    Special Requests   Final    BOTTLES DRAWN AEROBIC AND ANAEROBIC Blood Culture results may not be optimal due to an inadequate volume of blood received in culture bottles Performed at [facility], [address], [place], [place] [address]    Culture   Final    NO GROWTH 3 DAYS Performed at [facility], [address] [place], [place] [address]    Report Status PENDING  Incomplete  Culture,  blood (Routine x 2)     Status: None (Preliminary result)   Collection Time: 02/09/24 10:17 PM   Specimen: BLOOD LEFT FOREARM  Result Value Ref Range Status   Specimen Description   Final    BLOOD LEFT FOREARM Performed at [facility], [address] [place], [place] [address]    Special Requests   Final    BOTTLES DRAWN AEROBIC AND ANAEROBIC Blood Culture results may not be optimal due to an inadequate volume of blood received in culture bottles Performed at [facility], [address], [place], [place] [address]    Culture   Final    NO GROWTH 3 DAYS Performed at [facility], [address] [place], [place] [address]    Report Status PENDING  Incomplete  Urine Culture     Status: None   Collection Time: 02/09/24 10:53 PM   Specimen: Urine, Catheterized  Result Value Ref Range Status   Specimen Description   Final    URINE, CATHETERIZED Performed at [facility], [address], [place], [place] [address]    Special Requests   Final    NONE Performed at [facility], [address] [place], [place] [address]    Culture   Final    NO GROWTH Performed at [facility], [address] [place], [place] [address]    Report Status 02/11/2024 FINAL  Final  Resp panel by RT-PCR (RSV, Flu A&B, Covid) Anterior Nasal Swab     Status: None   Collection Time: 02/09/24 10:55 PM   Specimen: Anterior Nasal Swab  Result Value Ref Range Status   SARS Coronavirus 2 by RT PCR NEGATIVE NEGATIVE Final    Comment: (NOTE) SARS-CoV-2 target nucleic acids are NOT DETECTED.  The SARS-CoV-2 RNA is generally detectable in upper respiratory specimens during the acute phase of infection. The lowest concentration of SARS-CoV-2 viral copies this assay can detect is 138 copies/mL. A negative result does not preclude SARS-Cov-2 infection and should not be used as the sole basis for treatment or other patient management  decisions. A negative result may occur with  improper specimen collection/handling, submission of specimen other than nasopharyngeal swab, presence of viral mutation(s) within the areas targeted by this assay, and inadequate number of viral copies(<138 copies/mL). A negative result must be combined with clinical observations, patient history, and epidemiological information. The expected result is Negative.  Fact Sheet for Patients:  BloggerCourse.com  Fact Sheet for Healthcare Providers:  SeriousBroker.it  This test is no t yet approved or cleared by the [place] FDA and  has been authorized for detection and/or diagnosis of SARS-CoV-2 by FDA under an Emergency Use Authorization (EUA). This EUA will remain  in effect (meaning this test can be used) for the duration of the COVID-19 declaration under Section 564(b)(1) of the Act, 21 U.S.C.section 360bbb-3(b)(1), unless the authorization is terminated  or revoked sooner.  Influenza A by PCR NEGATIVE NEGATIVE Final   Influenza B by PCR NEGATIVE NEGATIVE Final    Comment: (NOTE) The Xpert Xpress SARS-CoV-2/FLU/RSV plus assay is intended as an aid in the diagnosis of influenza from Nasopharyngeal swab specimens and should not be used as a sole basis for treatment. Nasal washings and aspirates are unacceptable for Xpert Xpress SARS-CoV-2/FLU/RSV testing.  Fact Sheet for Patients: BloggerCourse.com  Fact Sheet for Healthcare Providers: SeriousBroker.it  This test is not yet approved or cleared by the [place] FDA and has been authorized for detection and/or diagnosis of SARS-CoV-2 by FDA under an Emergency Use Authorization (EUA). This EUA will remain in effect (meaning this test can be used) for the duration of the COVID-19 declaration under Section 564(b)(1) of the Act, 21 U.S.C. section 360bbb-3(b)(1), unless the  authorization is terminated or revoked.     Resp Syncytial Virus by PCR NEGATIVE NEGATIVE Final    Comment: (NOTE) Fact Sheet for Patients: BloggerCourse.com  Fact Sheet for Healthcare Providers: SeriousBroker.it  This test is not yet approved or cleared by the [place] FDA and has been authorized for detection and/or diagnosis of SARS-CoV-2 by FDA under an Emergency Use Authorization (EUA). This EUA will remain in effect (meaning this test can be used) for the duration of the COVID-19 declaration under Section 564(b)(1) of the Act, 21 U.S.C. section 360bbb-3(b)(1), unless the authorization is terminated or revoked.  Performed at [facility], [address], [place], [place] [address]    Creatinine: Recent Labs    02/08/24 0940 02/09/24 2157 02/10/24 0606 02/11/24 0420 02/12/24 0416 02/13/24 0357  CREATININE 4.20* 4.46* 4.49* 4.35* 5.30* 5.15*    Impression/Assessment:  Bladder stones, left ureteral stone, left ureteral stone status post left stent and Foley-his daughter said he passed a stone and she brought that to our office.  We discussed that is likely one of the bladder stones although he could have passed a ureteral stone but typically not with a stent.  Also discussed he has the left lower pole stone.  Plan:  I discussed with the patient and his daughter the nature, potential benefits, risks and alternatives to cystoscopy, left ureteral stent exchange, left ureteroscopy laser lithotripsy, including side effects of the proposed treatment, the likelihood of the patient achieving the goals of the procedure, and any potential problems that might occur during the procedure or recuperation.  We discussed he is frail and this could stir up some bacteria which could be life-threatening.  That being said his creatinine is stabilized.  White count is improved.  Cultures negative.  While he is in-house on  IV antibiotics and IV fluids, likely as good at time is in need to proceed.  Also discussed nonoperative management or stent removal.  All questions answered. Patient and daughter elect to proceed.  I will likely chart check tomorrow and make sure preoperative orders are in for Friday, 02/15/2024.  Discussed with Dr. Nelson Chimes.    Jerilee Field 02/13/2024, 12:04 PM

## 2024-02-13 NOTE — Progress Notes (Signed)
 Physical Therapy Treatment Patient Details Name: Charles Richard MRN: [id number] DOB: [birth year] Today's Date: 02/13/2024   History of Present Illness 89 year old  presents to the ED3/1/25  via EMS with complaints of fever, dysuria, and cough. Prior history of dementia, AAA, permanent A-fib on Eliquis, DM 2, HTN, impaired hearing, CVA, HLD, CKD 3B admitted to the hospital 1/25 - 01/09/2024 for obstructive renal stone requiring emergent cystoscopy and left-sided stent placement.  Eventually discharged on 10 days of oral antibiotics and Foley catheter.    PT Comments  Daughter present with concerns pt is not getting Therapy every day and that staff is "not getting him up".   Addressed her concerns and Educated on Therapy schedule.  Assisted OOB to [facility] was difficult.  General bed mobility comments: required increased assist to transfer from supine to EOB using bed pad to complete scooting.  Max effort.  Found to be incont BM "I called but no one came in time", stated Pt.  General transfer comment: required + 2 assist to rise and turn 1/4 from elevated bed to [facility].  VERY unsteady.  Posterior LOB during stand to sit.  Therapist catch to control. Assisted with peri care as pt was unable to self perform.  General Gait Details: VERY difficult to amb even 5 feet due to weakness, impaired balance and coordination.  Recliner closely following behind.  Uncontrolled stand to sit agagin due to fatigue, Positioned in recliner to comfort. LPT has rec HH PT and resume prior private aide assist when discharged.     If plan is discharge home, recommend the following: A little help with walking and/or transfers;A little help with bathing/dressing/bathroom;Assistance with cooking/housework;Assist for transportation;Help with stairs or ramp for entrance   Can travel by private vehicle        Equipment Recommendations  None recommended by PT    Recommendations for Other Services       Precautions / Restrictions  Precautions Precautions: Fall Precaution/Restrictions Comments: likes to wear his shoes (grip) Restrictions Weight Bearing Restrictions Per Provider Order: No     Mobility  Bed Mobility Overal bed mobility: Needs Assistance Bed Mobility: Supine to Sit     Supine to sit: Mod assist, Max assist     General bed mobility comments: required increased assist to transfer from supine to EOB using bed pad to complete scooting.  Max effort.  Found to be incont BM "I called but no one came in time", stated Pt    Transfers Overall transfer level: Needs assistance Equipment used: Rolling walker (2 wheels)   Sit to Stand: Mod assist, +2 physical assistance, +2 safety/equipment           General transfer comment: required + 2 assist to rise and turn 1/4 from elevated bed to [facility].  VERY unsteady.  Posterior LOB during stand to sit.  Therapist catch to control.    Ambulation/Gait Ambulation/Gait assistance: +2 safety/equipment, Mod assist Gait Distance (Feet): 5 Feet Assistive device: Rolling walker (2 wheels) Gait Pattern/deviations: Step-through pattern, Step-to pattern, Decreased dorsiflexion - right Gait velocity: decr     General Gait Details: VERY difficult to amb even 5 feet due to weakness, impaired balance and coordination.  Recliner closely following behind.  Uncontrolled stand to sit agagin due to fatigue,   Stairs             Wheelchair Mobility     Tilt Bed    Modified Rankin (Stroke Patients Only)       Balance  Communication Communication Communication: Impaired Factors Affecting Communication: Hearing impaired  Cognition Arousal: Alert Behavior During Therapy: WFL for tasks assessed/performed   PT - Cognitive impairments: No apparent impairments                       PT - Cognition Comments: AxO x 3 pleasant and willing.  Lives home with Aide M-F 9 -5 while daughter is at  work.  Then rotating family support nights/weekends.  VERY HOH even with one R hearing aide Following commands: Intact      Cueing Cueing Techniques: Verbal cues  Exercises      General Comments        Pertinent Vitals/Pain Pain Assessment Pain Assessment: No/denies pain    Home Living                          Prior Function            PT Goals (current goals can now be found in the care plan section) Progress towards PT goals: Progressing toward goals    Frequency    Min 1X/week      PT Plan      Co-evaluation              AM-PAC PT "6 Clicks" Mobility   Outcome Measure  Help needed turning from your back to your side while in a flat bed without using bedrails?: A Lot Help needed moving from lying on your back to sitting on the side of a flat bed without using bedrails?: A Lot Help needed moving to and from a bed to a chair (including a wheelchair)?: A Lot Help needed standing up from a chair using your arms (e.g., wheelchair or bedside chair)?: A Lot Help needed to walk in hospital room?: A Lot Help needed climbing 3-5 steps with a railing? : A Lot 6 Click Score: 12    End of Session Equipment Utilized During Treatment: Gait belt Activity Tolerance: Patient limited by fatigue Patient left: in chair;with call bell/phone within reach;with chair alarm set Nurse Communication: Mobility status PT Visit Diagnosis: Unsteadiness on feet (R26.81);Muscle weakness (generalized) (M62.81);Difficulty in walking, not elsewhere classified (R26.2);Pain Pain - Right/Left: Right Pain - part of body: Ankle and joints of foot     Time: 1610-9604 PT Time Calculation (min) (ACUTE ONLY): 25 min  Charges:    $Gait Training: 8-22 mins $Therapeutic Activity: 8-22 mins PT General Charges $$ ACUTE PT VISIT: 1 Visit                     Felecia Shelling  PTA Acute  Rehabilitation Services Office M-F          [phone]

## 2024-02-14 LAB — CBC
HCT: 30.5 % — ABNORMAL LOW (ref 39.0–52.0)
Hemoglobin: 9.5 g/dL — ABNORMAL LOW (ref 13.0–17.0)
MCH: 30.9 pg (ref 26.0–34.0)
MCHC: 31.1 g/dL (ref 30.0–36.0)
MCV: 99.3 fL (ref 80.0–100.0)
Platelets: 343 10*3/uL (ref 150–400)
RBC: 3.07 MIL/uL — ABNORMAL LOW (ref 4.22–5.81)
RDW: 12.2 % (ref 11.5–15.5)
WBC: 10.1 10*3/uL (ref 4.0–10.5)
nRBC: 0 % (ref 0.0–0.2)

## 2024-02-14 LAB — BASIC METABOLIC PANEL
Anion gap: 12 (ref 5–15)
BUN: 76 mg/dL — ABNORMAL HIGH (ref 8–23)
CO2: 13 mmol/L — ABNORMAL LOW (ref 22–32)
Calcium: 8.2 mg/dL — ABNORMAL LOW (ref 8.9–10.3)
Chloride: 116 mmol/L — ABNORMAL HIGH (ref 98–111)
Creatinine, Ser: 4.47 mg/dL — ABNORMAL HIGH (ref 0.61–1.24)
GFR, Estimated: 12 mL/min — ABNORMAL LOW (ref >60)
Glucose, Bld: 128 mg/dL — ABNORMAL HIGH (ref 70–99)
Potassium: 4.3 mmol/L (ref 3.5–5.1)
Sodium: 141 mmol/L (ref 135–145)

## 2024-02-14 LAB — URINALYSIS, ROUTINE W REFLEX MICROSCOPIC
Bilirubin Urine: NEGATIVE
Glucose, UA: NEGATIVE mg/dL
Ketones, ur: NEGATIVE mg/dL
Nitrite: NEGATIVE
Protein, ur: 100 mg/dL — AB
RBC / HPF: >50 RBC/hpf (ref 0–5)
Specific Gravity, Urine: 1.009 (ref 1.005–1.030)
WBC, UA: >50 WBC/hpf (ref 0–5)
pH: 5 (ref 5.0–8.0)

## 2024-02-14 LAB — GLUCOSE, CAPILLARY
Glucose-Capillary: 207 mg/dL — ABNORMAL HIGH (ref 70–99)
Glucose-Capillary: 175 mg/dL — ABNORMAL HIGH (ref 70–99)
Glucose-Capillary: 124 mg/dL — ABNORMAL HIGH (ref 70–99)
Glucose-Capillary: 177 mg/dL — ABNORMAL HIGH (ref 70–99)

## 2024-02-14 LAB — MAGNESIUM: Magnesium: 1.9 mg/dL (ref 1.7–2.4)

## 2024-02-14 LAB — BRAIN NATRIURETIC PEPTIDE: B Natriuretic Peptide: 277.2 pg/mL — ABNORMAL HIGH (ref 0.0–100.0)

## 2024-02-14 MED ORDER — SODIUM CHLORIDE 0.9 % IV SOLN: 2.0000 g | INTRAVENOUS | Status: DC

## 2024-02-14 MED ORDER — SODIUM CHLORIDE 0.9 % IV SOLN: INTRAVENOUS | Status: AC

## 2024-02-14 MED ORDER — SODIUM BICARBONATE 650 MG PO TABS
1300.0000 mg | ORAL_TABLET | Freq: Three times a day (TID) | ORAL | Status: DC
  Administered 2024-02-14 – 2024-02-20 (×15): 1300 mg via ORAL
  Filled 2024-02-14 (×16): qty 2

## 2024-02-14 MED ORDER — FLUCONAZOLE IN SODIUM CHLORIDE 200-0.9 MG/100ML-% IV SOLN
200.0000 mg | INTRAVENOUS | Status: DC
  Administered 2024-02-14 – 2024-02-15 (×2): 200 mg via INTRAVENOUS
  Filled 2024-02-14 (×2): qty 100

## 2024-02-14 NOTE — Progress Notes (Addendum)
 I spoke to Dr. Nelson Chimes. He mentioned Charles Richard's urine looked more cloudy. Almost more mineral like. UA and cx sent. If UA favorable OK for URS tomorrow. If UA with untreated species like fungus we may add other therapy and delay URS. NPO p MN and consent on chart.   UA is back and unfortunately shows budding yeast. Would not recommend URS for tomorrow. Add antifungal coverage and will reschedule for next week.

## 2024-02-14 NOTE — Anesthesia Preprocedure Evaluation (Signed)
 Anesthesia Evaluation    Reviewed: Allergy & Precautions, H&P , Patient's Chart, lab work & pertinent test results, reviewed documented beta blocker date and time   Airway Mallampati: III  TM Distance: >3 FB Neck ROM: Full    Dental  (+) Edentulous Upper, Poor Dentition, Loose, Dental Advisory Given Bottom teeth extremely loose and diseased, upper edentulous except one tooth:   Pulmonary neg pulmonary ROS          Cardiovascular hypertension, Pt. on medications and Pt. on home beta blockers + Past MI  + dysrhythmias (eliquis) Atrial Fibrillation   Echo 2022  1. Left ventricular ejection fraction, by estimation, is 55 to 60%. The  left ventricle has normal function. The left ventricle has no regional  wall motion abnormalities. There is moderate left ventricular hypertrophy.  Left ventricular diastolic parameters are indeterminate.   2. Right ventricular systolic function is normal. The right ventricular  size is normal. There is normal pulmonary artery systolic pressure. The  estimated right ventricular systolic pressure is 19.6 mmHg.   3. Left atrial size was mildly dilated.   4. The mitral valve is grossly normal. Trivial mitral valve  regurgitation.   5. The aortic valve is tricuspid. Aortic valve regurgitation is not  visualized. Mild aortic valve sclerosis is present, with no evidence of  aortic valve stenosis.   6. The inferior vena cava is normal in size with greater than 50%  respiratory variability, suggesting right atrial pressure of 3 mmHg.     Neuro/Psych CVA (2010), No Residual Symptoms  negative psych ROS   GI/Hepatic negative GI ROS, Neg liver ROS,,,  Endo/Other  diabetes, Well Controlled, Type 2, Oral Hypoglycemic Agents    Renal/GU CRFRenal disease (L ureteral stone)Lab Results      Component                Value               Date                      NA                       141                 02/14/2024                 CL                       116 (H)             02/14/2024                K                        4.3                 02/14/2024                CO2                      13 (L)              02/14/2024                BUN                      76 (  H)              02/14/2024                CREATININE               4.47 (H)            02/14/2024                  negative genitourinary   Musculoskeletal  (+) Arthritis , Osteoarthritis,    Abdominal   Peds negative pediatric ROS (+)  Hematology  (+) Blood dyscrasia, anemia Lab Results      Component                Value               Date                      WBC                      10.1                02/14/2024                HGB                      9.5 (L)             02/14/2024                HCT                      30.5 (L)            02/14/2024                MCV                      99.3                02/14/2024                PLT                      343                 02/14/2024              Anesthesia Other Findings   Reproductive/Obstetrics negative OB ROS                             Anesthesia Physical Anesthesia Plan  ASA: 4  Anesthesia Plan: General   Post-op Pain Management: Ofirmev IV (intra-op)*   Induction: Intravenous  PONV Risk Score and Plan: 3 and Ondansetron, Dexamethasone and Treatment may vary due to age or medical condition  Airway Management Planned: LMA  Additional Equipment:   Intra-op Plan:   Post-operative Plan:   Informed Consent:      Dental advisory given  Plan Discussed with: CRNA and Surgeon  Anesthesia Plan Comments:         Anesthesia Quick Evaluation

## 2024-02-14 NOTE — Progress Notes (Addendum)
 PROGRESS NOTE    KOLTIN WEHMEYER  ZOX:[id number] DOB: [birth year] DOA: 02/09/2024 PCP: [facility]    Brief Narrative:   89 year old with history of dementia, AAA, permanent A-fib on Eliquis, DM 2, HTN, impaired hearing, CVA, HLD, CKD 3B admitted to the hospital 1/25 - 01/09/2024 for obstructive renal stone requiring emergent cystoscopy and left-sided stent placement.  Eventually discharged on 10 days of oral antibiotics and Foley catheter.  Now admitted for urosepsis initially on cefepime now transition to IV Rocephin.  Urology planning on stent removal on 3/7.  Creatinine remains elevated which has been chronic.  Discussed with Dr Mena Goes, requested to keep him on IV abx inptn, and he will perform stent removal inptn on Friday.   Assessment & Plan:  Principal Problem:   Complicated urinary tract infection Active Problems:   Type 2 diabetes mellitus (HCC)   Hyponatremia   Hyperkalemia   Hyperlipidemia   Metabolic acidosis   Sepsis secondary to complicated UTI due to renal stone and Foley catheter CAUTI -Admitted for urosepsis.  Sepsis physiology slowly improving.  Abdominal x-ray shows left ureteral stent in good placement.  Reviewed by urology, no urgent intervention at this point.  Foley catheter replaced by the nurses on 3/2. - Cultures are negative.  Transition to Rocephin Repeat UA due to cloudy urine, rediscussed with Dr Mena Goes today  Discussed with Dr Mena Goes, requested to keep him on IV abx, and he will perform stent removal inptn on Friday.   Permanent A-fib, intermittent RVR Coreg, Eliquis   Chronic kidney disease stage IV- V Metabolic acidosis.  -For the past several weeks patient's creatinine has been greater than 4, as I had 7.3 back in January.  Follows at the [place]. creatinine now stable between 4.5-5.3.  Renal ultrasound showing mild bilateral hydronephrosis otherwise unremarkable.  Due to drowsiness have discontinued Ambien and gabapentin -Will  increase sodium bicarb tablets.  Continue gentle hydration -Can involve nephrology if necessary but at this point I do not think he is really dialysis candidate   Hyperkalemi - Improved    Hypomagnesemia As needed repletion    Non-insulin-dependent type 2 diabetes Last A1c 6.5 on 01/05/2024.  Placed on sensitive sliding scale insulin ACHS.   Hypertension Resume Coreg IV as needed   Chronic anemia Hemoglobin stable, monitor labs.   Dementia: Delirium precautions. History of CVA Hyperlipidemia   DVT prophylaxis: Eliquis    Code Status: Do not attempt resuscitation (DNR) PRE-ARREST INTERVENTIONS DESIRED Family Communication: Called [name] Status is: Inpatient Remains inpatient appropriate because: Continue hospital stay at least until Saturday per urology.    Subjective: Sitting up in the chair.  Cloudy urine in the Foley catheter  Examination:  General exam: Appears calm and comfortable, chronically ill-appearing Respiratory system: Clear to auscultation. Respiratory effort normal. Cardiovascular system: S1 & S2 heard, RRR. No JVD, murmurs, rubs, gallops or clicks. No pedal edema. Gastrointestinal system: Abdomen is nondistended, soft and nontender. No organomegaly or masses felt. Normal bowel sounds heard. Central nervous system: Alert and oriented. No focal neurological deficits. Extremities: Symmetric 4x 5 power. Skin: No rashes, lesions or ulcers Psychiatry: Judgement and insight appear normal. Mood & affect appropriate. Foley catheter in place with cloudy urine               Diet Orders (From admission, onward)     Start     Ordered   02/10/24 0525  Diet heart healthy/carb modified Room service appropriate? Yes; Fluid consistency: Thin  Diet effective now  Question Answer Comment  Diet-HS Snack? Nothing   Room service appropriate? Yes   Fluid consistency: Thin      02/10/24 0526            Objective: Vitals:   02/13/24 1416 02/13/24  1950 02/14/24 0410 02/14/24 1315  BP: 113/73 105/63 124/79 123/86  Pulse: 84 87 (!) 103 93  Resp: 16 19 19 20   Temp: 98 F (36.7 C) 98.2 F (36.8 C) 98.3 F (36.8 C) 98.3 F (36.8 C)  TempSrc: Oral Oral Oral Oral  SpO2: 100% 100% 100% 100%  Weight:      Height:        Intake/Output Summary (Last 24 hours) at 02/14/2024 1449 Last data filed at 02/14/2024 1022 Gross per 24 hour  Intake 1487.5 ml  Output 1105 ml  Net 382.5 ml   Filed Weights   02/10/24 1200  Weight: 103.5 kg    Scheduled Meds:  apixaban  2.5 mg Oral BID   carvedilol  6.25 mg Oral BID WC   Chlorhexidine Gluconate Cloth  6 each Topical Daily   docusate sodium  100 mg Oral Daily   insulin aspart  0-5 Units Subcutaneous QHS   insulin aspart  0-9 Units Subcutaneous TID WC   latanoprost  1 drop Both Eyes QHS   loratadine  10 mg Oral Q48H   simvastatin  10 mg Oral Daily   sodium bicarbonate  1,300 mg Oral TID   Continuous Infusions:  sodium chloride 75 mL/hr at 02/14/24 1317   cefTRIAXone (ROCEPHIN)  IV Stopped (02/13/24 1631)    Nutritional status     Body mass index is 30.95 kg/m.  Data Reviewed:   CBC: Recent Labs  Lab 02/09/24 2157 02/10/24 0606 02/11/24 0420 02/12/24 0416 02/13/24 0357 02/14/24 0355  WBC 12.6* 10.3 11.5* 15.4* 10.3 10.1  NEUTROABS 9.6*  --   --   --   --   --   HGB 10.8* 9.4* 9.4* 9.5* 8.8* 9.5*  HCT 35.7* 29.5* 30.0* 31.7* 29.1* 30.5*  MCV 102.0* 99.7 98.0 103.6* 101.4* 99.3  PLT 369 320 334 300 292 343   Basic Metabolic Panel: Recent Labs  Lab 02/10/24 0606 02/11/24 0420 02/12/24 0416 02/13/24 0357 02/14/24 0355  NA 134* 136 133* 135 141  K 5.4* 5.1 5.0 4.5 4.3  CL 107 108 105 109 116*  CO2 17* 17* 14* 15* 13*  GLUCOSE 111* 120* 142* 106* 128*  BUN 72* 74* 77* 78* 76*  CREATININE 4.49* 4.35* 5.30* 5.15* 4.47*  CALCIUM 8.4* 8.4* 7.9* 8.0* 8.2*  MG  --  1.6* 2.5* 2.3 1.9  PHOS  --  4.4  --   --   --    GFR: Estimated Creatinine Clearance: 13.4 mL/min (A)  (by C-G formula based on SCr of 4.47 mg/dL (H)). Liver Function Tests: Recent Labs  Lab 02/09/24 2157 02/10/24 0606  AST 61* 38  ALT 53* 43  ALKPHOS 46 34*  BILITOT 0.3 0.6  PROT 7.5 6.3*  ALBUMIN 2.7* 2.2*   No results for input(s): "LIPASE", "AMYLASE" in the last 168 hours. Recent Labs  Lab 02/13/24 0357  AMMONIA 35   Coagulation Profile: Recent Labs  Lab 02/09/24 2157  INR 1.3*   Cardiac Enzymes: No results for input(s): "CKTOTAL", "CKMB", "CKMBINDEX", "TROPONINI" in the last 168 hours. BNP (last 3 results) No results for input(s): "PROBNP" in the last 8760 hours. HbA1C: No results for input(s): "HGBA1C" in the last 72 hours. CBG: Recent Labs  Lab  02/13/24 1237 02/13/24 1622 02/13/24 1953 02/14/24 0753 02/14/24 1154  GLUCAP 118* 125* 200* 207* 175*   Lipid Profile: No results for input(s): "CHOL", "HDL", "LDLCALC", "TRIG", "CHOLHDL", "LDLDIRECT" in the last 72 hours. Thyroid Function Tests: No results for input(s): "TSH", "T4TOTAL", "FREET4", "T3FREE", "THYROIDAB" in the last 72 hours. Anemia Panel: No results for input(s): "VITAMINB12", "FOLATE", "FERRITIN", "TIBC", "IRON", "RETICCTPCT" in the last 72 hours. Sepsis Labs: Recent Labs  Lab 02/09/24 2203 02/10/24 0618  LATICACIDVEN 2.1* 0.6    Recent Results (from the past 240 hours)  Urine Culture     Status: None   Collection Time: 02/09/24 12:00 AM   Specimen: Urine, Random  Result Value Ref Range Status   Specimen Description   Final    URINE, RANDOM Performed at [facility], [address], [place], [place] [address]    Special Requests   Final    NONE Reflexed from [id number] Performed at [facility], [address] [place], [place] [address]    Culture   Final    NO GROWTH Performed at [facility], [address], [place], [place] [address]    Report Status 02/11/2024 FINAL  Final  Culture, blood (Routine x 2)     Status: None (Preliminary  result)   Collection Time: 02/09/24  9:54 PM   Specimen: BLOOD RIGHT FOREARM  Result Value Ref Range Status   Specimen Description   Final    BLOOD RIGHT FOREARM Performed at [facility], [address], [place], [place] [address]    Special Requests   Final    BOTTLES DRAWN AEROBIC AND ANAEROBIC Blood Culture results may not be optimal due to an inadequate volume of blood received in culture bottles Performed at [facility], [address], [place], [place] [address]    Culture   Final    NO GROWTH 4 DAYS Performed at [facility], [address] [place], [place] [address]    Report Status PENDING  Incomplete  Culture, blood (Routine x 2)     Status: None (Preliminary result)   Collection Time: 02/09/24 10:17 PM   Specimen: BLOOD LEFT FOREARM  Result Value Ref Range Status   Specimen Description   Final    BLOOD LEFT FOREARM Performed at [facility], [address], [place], [place] [address]    Special Requests   Final    BOTTLES DRAWN AEROBIC AND ANAEROBIC Blood Culture results may not be optimal due to an inadequate volume of blood received in culture bottles Performed at [facility], [address], [place], [place] [address]    Culture   Final    NO GROWTH 4 DAYS Performed at [facility], [address] [place], [place] [address]    Report Status PENDING  Incomplete  Urine Culture     Status: None   Collection Time: 02/09/24 10:53 PM   Specimen: Urine, Catheterized  Result Value Ref Range Status   Specimen Description   Final    URINE, CATHETERIZED Performed at [facility], [address], [place], [place] [address]    Special Requests   Final    NONE Performed at [facility], [address], [place], [place] [address]    Culture   Final    NO GROWTH Performed at [facility], [address] [place], [place] [address]    Report Status 02/11/2024  FINAL  Final  Resp panel by RT-PCR (RSV, Flu A&B, Covid) Anterior Nasal Swab     Status: None   Collection Time: 02/09/24 10:55 PM   Specimen: Anterior Nasal Swab  Result Value Ref Range Status   SARS Coronavirus 2 by RT PCR NEGATIVE NEGATIVE Final    Comment: (NOTE) SARS-CoV-2 target nucleic acids are NOT DETECTED.  The SARS-CoV-2 RNA is generally detectable in upper respiratory specimens during the acute phase of infection. The lowest concentration of SARS-CoV-2 viral copies this assay can detect is 138 copies/mL. A negative result does not preclude SARS-Cov-2 infection and should not be used as the sole basis for treatment or other patient management decisions. A negative result may occur with  improper specimen collection/handling, submission of specimen other than nasopharyngeal swab, presence of viral mutation(s) within the areas targeted by this assay, and inadequate number of viral copies(<138 copies/mL). A negative result must be combined with clinical observations, patient history, and epidemiological information. The expected result is Negative.  Fact Sheet for Patients:  BloggerCourse.com  Fact Sheet for Healthcare Providers:  SeriousBroker.it  This test is no t yet approved or cleared by the [place] FDA and  has been authorized for detection and/or diagnosis of SARS-CoV-2 by FDA under an Emergency Use Authorization (EUA). This EUA will remain  in effect (meaning this test can be used) for the duration of the COVID-19 declaration under Section 564(b)(1) of the Act, 21 U.S.C.section 360bbb-3(b)(1), unless the authorization is terminated  or revoked sooner.       Influenza A by PCR NEGATIVE NEGATIVE Final   Influenza B by PCR NEGATIVE NEGATIVE Final    Comment: (NOTE) The Xpert Xpress SARS-CoV-2/FLU/RSV plus assay is intended as an aid in the diagnosis of influenza from Nasopharyngeal swab specimens and should  not be used as a sole basis for treatment. Nasal washings and aspirates are unacceptable for Xpert Xpress SARS-CoV-2/FLU/RSV testing.  Fact Sheet for Patients: BloggerCourse.com  Fact Sheet for Healthcare Providers: SeriousBroker.it  This test is not yet approved or cleared by the [place] FDA and has been authorized for detection and/or diagnosis of SARS-CoV-2 by FDA under an Emergency Use Authorization (EUA). This EUA will remain in effect (meaning this test can be used) for the duration of the COVID-19 declaration under Section 564(b)(1) of the Act, 21 U.S.C. section 360bbb-3(b)(1), unless the authorization is terminated or revoked.     Resp Syncytial Virus by PCR NEGATIVE NEGATIVE Final    Comment: (NOTE) Fact Sheet for Patients: BloggerCourse.com  Fact Sheet for Healthcare Providers: SeriousBroker.it  This test is not yet approved or cleared by the [place] FDA and has been authorized for detection and/or diagnosis of SARS-CoV-2 by FDA under an Emergency Use Authorization (EUA). This EUA will remain in effect (meaning this test can be used) for the duration of the COVID-19 declaration under Section 564(b)(1) of the Act, 21 U.S.C. section 360bbb-3(b)(1), unless the authorization is terminated or revoked.  Performed at [facility], [address], [place], [place] [address]          Radiology Studies: US RENAL Result Date: 02/12/2024 CLINICAL DATA:  Acute kidney injury EXAM: RENAL / URINARY TRACT ULTRASOUND COMPLETE COMPARISON:  CT 01/05/2024 FINDINGS: Right Kidney: Renal measurements: 11.9 x 6 x 6.8 cm = volume: 252 mL. Echogenicity within normal limits. Mild right hydronephrosis. Cysts measuring up to 4.4 cm at the upper pole for which no imaging follow-up is recommended. Left Kidney: Renal measurements: 11.8 x 5.7  x 6 cm = volume: 213.3 mL.  Poorly visible. Echogenicity probably within normal limits. Mild left hydronephrosis. No discrete mass by sonography. Bladder: Decompressed by catheter Other: None. IMPRESSION: 1. There is mild bilateral hydronephrosis 2. Right renal cysts for which no imaging follow-up is recommended Electronically Signed   By: Jasmine Pang M.D.   On: 02/12/2024 19:39           LOS: 4 days   Time spent= 35 mins    Miguel Rota, MD [facility]  If 7PM-7AM, please contact night-coverage  02/14/2024, 2:49 PM

## 2024-02-15 ENCOUNTER — Other Ambulatory Visit: Payer: Self-pay | Admitting: Urology

## 2024-02-15 ENCOUNTER — Encounter (HOSPITAL_COMMUNITY)
Admission: EM | Disposition: A | Discharge status: Home Health | Payer: Self-pay | Source: Home / Self Care | Attending: Internal Medicine

## 2024-02-15 ENCOUNTER — Encounter (HOSPITAL_COMMUNITY): Payer: Self-pay | Admitting: Anesthesiology

## 2024-02-15 ENCOUNTER — Inpatient Hospital Stay (HOSPITAL_COMMUNITY)
Admission: RE | Admit: 2024-02-15 | Payer: No Typology Code available for payment source | Source: Home / Self Care | Admitting: Urology

## 2024-02-15 ENCOUNTER — Encounter (HOSPITAL_COMMUNITY): Payer: Self-pay | Admitting: Physician Assistant

## 2024-02-15 LAB — CBC
HCT: 30.1 % — ABNORMAL LOW (ref 39.0–52.0)
Hemoglobin: 9.4 g/dL — ABNORMAL LOW (ref 13.0–17.0)
MCH: 31.3 pg (ref 26.0–34.0)
MCHC: 31.2 g/dL (ref 30.0–36.0)
MCV: 100.3 fL — ABNORMAL HIGH (ref 80.0–100.0)
Platelets: 353 10*3/uL (ref 150–400)
RBC: 3 MIL/uL — ABNORMAL LOW (ref 4.22–5.81)
RDW: 12.2 % (ref 11.5–15.5)
WBC: 9.4 10*3/uL (ref 4.0–10.5)
nRBC: 0 % (ref 0.0–0.2)

## 2024-02-15 LAB — GLUCOSE, CAPILLARY
Glucose-Capillary: 141 mg/dL — ABNORMAL HIGH (ref 70–99)
Glucose-Capillary: 184 mg/dL — ABNORMAL HIGH (ref 70–99)
Glucose-Capillary: 78 mg/dL (ref 70–99)
Glucose-Capillary: 179 mg/dL — ABNORMAL HIGH (ref 70–99)

## 2024-02-15 LAB — BASIC METABOLIC PANEL
Anion gap: 14 (ref 5–15)
BUN: 70 mg/dL — ABNORMAL HIGH (ref 8–23)
CO2: 16 mmol/L — ABNORMAL LOW (ref 22–32)
Calcium: 7.9 mg/dL — ABNORMAL LOW (ref 8.9–10.3)
Chloride: 114 mmol/L — ABNORMAL HIGH (ref 98–111)
Creatinine, Ser: 3.79 mg/dL — ABNORMAL HIGH (ref 0.61–1.24)
GFR, Estimated: 14 mL/min — ABNORMAL LOW (ref >60)
Glucose, Bld: 136 mg/dL — ABNORMAL HIGH (ref 70–99)
Potassium: 4 mmol/L (ref 3.5–5.1)
Sodium: 144 mmol/L (ref 135–145)

## 2024-02-15 LAB — CULTURE, BLOOD (ROUTINE X 2)
Culture: NO GROWTH
Culture: NO GROWTH

## 2024-02-15 LAB — MAGNESIUM: Magnesium: 1.6 mg/dL — ABNORMAL LOW (ref 1.7–2.4)

## 2024-02-15 SURGERY — CYSTOSCOPY/URETEROSCOPY/HOLMIUM LASER/STENT PLACEMENT
Anesthesia: General | Laterality: Left

## 2024-02-15 MED ORDER — LACTATED RINGERS IV SOLN: INTRAVENOUS | Status: DC

## 2024-02-15 MED ORDER — MAGNESIUM SULFATE 2 GM/50ML IV SOLN
2.0000 g | Freq: Once | INTRAVENOUS | Status: DC
  Filled 2024-02-15: qty 50

## 2024-02-15 MED ORDER — MAGNESIUM SULFATE 2 GM/50ML IV SOLN
2.0000 g | Freq: Once | INTRAVENOUS | Status: AC
  Administered 2024-02-15: 2 g via INTRAVENOUS
  Filled 2024-02-15: qty 50

## 2024-02-15 MED ORDER — CHLORHEXIDINE GLUCONATE 0.12 % MT SOLN
15.0000 mL | Freq: Once | OROMUCOSAL | Status: AC
  Administered 2024-02-15: 15 mL via OROMUCOSAL

## 2024-02-15 MED ORDER — SODIUM CHLORIDE 0.9 % IV SOLN: INTRAVENOUS | Status: DC

## 2024-02-15 MED ORDER — ORAL CARE MOUTH RINSE: 15.0000 mL | Freq: Once | OROMUCOSAL | Status: AC

## 2024-02-15 NOTE — Progress Notes (Signed)
 S: Patient looks well, eating breakfast.  O: Vitals:   02/14/24 2026 02/15/24 0638  BP: (!) 155/93 131/71  Pulse: (!) 58 88  Resp: 18 17  Temp: (!) 97.5 F (36.4 C) 99.4 F (37.4 C)  SpO2: 99% 99%   New Foley catheter in place-urine much clearer today.  A/P: Left ureteral stone, left renal stone with bacterial and fungal UTI-Foley catheter replaced.  -Continue antibiotics and antifungals -Will reschedule patient for Tuesday, 02/19/2024.  Discussed with daughter and son although it is frustrating to reschedule he is frail and ureteroscopy can start up any contaminants and cause life-threatening sepsis, bacteremia or fungemia.  We want to limit this risk as much as possible.

## 2024-02-15 NOTE — Progress Notes (Signed)
 Occupational Therapy Treatment Patient Details Name: Charles Richard MRN: [id number] DOB: 08-Sep-1930 Today's Date: 02/15/2024   History of present illness 89 year old  presents to the ED3/1/25  via EMS with complaints of fever, dysuria, and cough. Prior history of dementia, AAA, permanent A-fib on Eliquis, DM 2, HTN, impaired hearing, CVA, HLD, CKD 3B admitted to the hospital 1/25 - 01/09/2024 for obstructive renal stone requiring emergent cystoscopy and left-sided stent placement.  Eventually discharged on 10 days of oral antibiotics and Foley catheter.   OT comments  Pt. Seen for skilled OT treatment session with son and dtr present.  Both active in his care and assisted during in room mobility.  Dtr. requesting PT and desire for longer walks for entire duration of session even with explanation of OT goals and how therapy frequency works on medical floors.  She reports B/D goals are not needed as pt. Has an aide at home.  Will cont. Our focus on bed mobility, and toileting.  Pt. Will also benefit from continued work with PLB to aide in management of fatigue after activity along with energy conservation strategies as well.  Cont. With acute OT POC with modifications to goals per family request.  Will alert OTR/L also.        If plan is discharge home, recommend the following:  A lot of help with bathing/dressing/bathroom;A little help with walking and/or transfers;Assist for transportation;Direct supervision/assist for medications management;Help with stairs or ramp for entrance   Equipment Recommendations  None recommended by OT    Recommendations for Other Services      Precautions / Restrictions Precautions Precautions: Fall Precaution/Restrictions Comments: likes to wear his shoes (grip)       Mobility Bed Mobility               General bed mobility comments: seated in recliner at beg. and end of session    Transfers Overall transfer level: Needs assistance Equipment  used: Rolling walker (2 wheels) Transfers: Sit to/from Stand, Bed to chair/wheelchair/BSC Sit to Stand: Mod assist, +2 physical assistance     Step pivot transfers: Mod assist     General transfer comment: initial +2 for sit/stand but once on his feet one person assist with chair follow for safety. (son followed with the chair).  good recall of reaching back for arm rests on 3n1 but did not use arm rests for recliner, dtr./son report its because hes used to lift chair at home.     Balance                                           ADL either performed or assessed with clinical judgement   ADL Overall ADL's : Needs assistance/impaired           Upper Body Bathing Details (indicate cue type and reason): dtr and son present and both report b/d goals not needed as pt. has an aide that performs these tasks for him   Lower Body Bathing Details (indicate cue type and reason): dtr and son present and both report b/d goals not needed as pt. has an aide that performs these tasks for him   Upper Body Dressing Details (indicate cue type and reason): dtr and son present and both report b/d goals not needed as pt. has an aide that performs these tasks for him Lower Body Dressing: Total assistance;Sitting/lateral leans Lower Body Dressing  Details (indicate cue type and reason): dtr and son present and both report b/d goals not needed as pt. has an aide that performs these tasks for him Toilet Transfer: Moderate assistance;Cueing for sequencing;Ambulation;Regular Toilet;BSC/3in1;Rolling walker (2 wheels);Grab bars Toilet Transfer Details (indicate cue type and reason): 3n1 over the commode Toileting- Clothing Manipulation and Hygiene: Sit to/from stand;Total assistance Toileting - Clothing Manipulation Details (indicate cue type and reason): dtr. present and reports he requires assistance for peri care after BM     Functional mobility during ADLs: Minimal assistance;Rolling walker  (2 wheels);Cueing for safety;Cueing for sequencing;Moderate assistance General ADL Comments: when i introduced myself as a therapist asst. from OT.  pt. made a sad face and noises indicating that she was not happy. she states "oh OT not PT he needs PT".  expressing feeling that PT is more what her father needs.  states pt. has no b/d needs since he has an aide.  reviewed we could d/c our services if she felt they were not needed or remove the goals she did not want and focus more on toileting as that is something he has to be able to do and it requires the ambulation she is wanting him to have. she agreed to focus on the OT goals that she felt were needed but cont. to express her wanting longer walks and more PT.  after cont. comments from her and her brother who was also present i attempted to review again with her what previous therapists had reviewed regarding pt. being on medical floor and how therapy frequencies work on these floors.  she was some what receptive but continued to make comments and express wanting more walking even while he was walking.  pt. with notable fatigue and required cues for PLB during seated rest.  reviewed with pt., dtr, and son rec. for up for meals and toileting but build in some rest time in bed also.  this will allow them to get the increased mobilty they want but also allow for rest which is also needed.    Extremity/Trunk Assessment              Occupational psychologist Communication: Impaired Factors Affecting Communication: Hearing impaired   Cognition Arousal: Alert Behavior During Therapy: WFL for tasks assessed/performed Cognition: Difficult to assess                               Following commands: Intact        Cueing   Cueing Techniques: Verbal cues  Exercises      Shoulder Instructions       General Comments      Pertinent Vitals/ Pain       Pain Assessment Pain  Assessment: No/denies pain  Home Living                                          Prior Functioning/Environment              Frequency  Min 1X/week        Progress Toward Goals  OT Goals(current goals can now be found in the care plan section)  Progress towards OT goals: Progressing toward goals     Plan      Co-evaluation  AM-PAC OT "6 Clicks" Daily Activity     Outcome Measure   Help from another person eating meals?: None Help from another person taking care of personal grooming?: A Little Help from another person toileting, which includes using toliet, bedpan, or urinal?: A Lot Help from another person bathing (including washing, rinsing, drying)?: A Lot Help from another person to put on and taking off regular upper body clothing?: A Little Help from another person to put on and taking off regular lower body clothing?: A Lot 6 Click Score: 16    End of Session Equipment Utilized During Treatment: Gait belt;Rolling walker (2 wheels)  OT Visit Diagnosis: Unsteadiness on feet (R26.81);Other abnormalities of gait and mobility (R26.89);Muscle weakness (generalized) (M62.81)   Activity Tolerance Patient tolerated treatment well   Patient Left in chair;with call bell/phone within reach;with family/visitor present   Nurse Communication Other (comment);Mobility status (rn states ok to work with pt., updated rn at end of session pts. mobility status to/from b.room along with conversation with son/dtr. about how to pace mobility and oob during the day)        Time: 1031-1050 OT Time Calculation (min): 19 min  Charges: OT General Charges $OT Visit: 1 Visit OT Treatments $Self Care/Home Management : 8-22 mins  Boneta Lucks, COTA/L Acute Rehabilitation [phone]   Alessandra Bevels Lorraine-COTA/L 02/15/2024, 11:19 AM

## 2024-02-15 NOTE — Therapy (Signed)
 OT Note: Per interdisciplinary communication with Burney Gauze, OTA  who worked with pt today, OT goals have been updated per family's wishes to eliminate dressing and bathing goals as pt has Aide assist for this and family is satisfied with this setup. Ot to will proceed to work with pt on current goals to prepare for discharge.

## 2024-02-15 NOTE — Progress Notes (Addendum)
 Procedure cancelled for today due to yeast in urine per Dr Mena Goes. Patient made aware.  Unit aware and patient transported back to floor

## 2024-02-15 NOTE — Progress Notes (Signed)
 PROGRESS NOTE    MAHONRI SEIDEN  NGE:[id number] DOB: Jul 29, 1930 DOA: 02/09/2024 PCP: [facility]   Brief Narrative: This 89 year old Male with history of dementia, AAA, permanent A-fib on Eliquis, DM 2, HTN, impaired hearing, CVA, HLD, CKD 3B admitted to the hospital from 1/25 - 01/09/2024 for obstructive renal stone requiring emergent cystoscopy and left-sided stent placement.  Eventually discharged on 10 days of oral antibiotics and Foley catheter.  Now admitted for urosepsis,  initially on cefepime now transitioned to IV Rocephin.  Urology planning on stent removal on 02/15/24.  Creatinine remains elevated which has been chronic. Stent removal has been canceled until Tuesday due to UA showing yeast infection.     Assessment & Plan:   Principal Problem:   Complicated urinary tract infection Active Problems:   Type 2 diabetes mellitus (HCC)   Hyponatremia   Hyperkalemia   Hyperlipidemia   Metabolic acidosis  Sepsis secondary to complicated UTI: CAUTI: He is admitted for urosepsis.  Sepsis physiology slowly improving.   Abdominal x-ray showed left ureteral stent in good placement.   Reviewed by urology, no urgent intervention at this point.   Foley catheter replaced by the nurses on 3/2. Cultures are negative.  Antibiotics transitioned to ceftriaxone. Repeat UA due to cloudy urine, rediscussed with Dr Mena Goes. Stent removal has been delayed until Tuesday due to UA showing yeast infection Continue antibiotics and antifungals.   Permanent A-fib, intermittent RVR: HR controlled, Continue Coreg, Eliquis.   Chronic kidney disease stage IV- V: Metabolic acidosis: For the past several weeks patient's creatinine has been greater than 4, as had 7.3 back in January.   Follows at the [place]. creatinine now stable between 4.5-5.3.  Renal ultrasound showing mild bilateral hydronephrosis. otherwise unremarkable.  Continue sodium bicarb tablets.  Continue gentle hydration Can involve  nephrology if necessary but at this point I do not think he is really dialysis candidate.   Hyperkalemia: Now Improved.   Hypomagnesemia: Continue as needed repletion.    Non-insulin-dependent type 2 diabetes Last A1c 6.5 on 01/05/2024.  Placed on sensitive sliding scale insulin ACHS.   Hypertension: Continue Coreg IV as needed   Chronic anemia Hemoglobin stable, monitor labs.   Dementia: Delirium precautions. History of CVA Hyperlipidemia     DVT prophylaxis: Eliquis Code Status: DNR Family Communication: No family at bed side Disposition Plan:    Status is: Inpatient Remains inpatient appropriate because: Severity of illness.    Consultants:  Urology  Procedures:   Antimicrobials:  Anti-infectives (From admission, onward)    Start     Dose/Rate Route Frequency Ordered Stop   02/15/24 0845  cefTRIAXone (ROCEPHIN) 2 g in sodium chloride 0.9 % 100 mL IVPB  Status:  Discontinued        2 g 200 mL/hr over 30 Minutes Intravenous 30 min pre-op 02/14/24 1524 02/14/24 1526   02/14/24 1745  fluconazole (DIFLUCAN) IVPB 200 mg        200 mg 100 mL/hr over 60 Minutes Intravenous Every 24 hours 02/14/24 1650     02/12/24 1715  cefTRIAXone (ROCEPHIN) 1 g in sodium chloride 0.9 % 100 mL IVPB        1 g 200 mL/hr over 30 Minutes Intravenous Every 24 hours 02/12/24 1621     02/10/24 2200  ceFEPIme (MAXIPIME) 2 g in sodium chloride 0.9 % 100 mL IVPB  Status:  Discontinued        2 g 200 mL/hr over 30 Minutes Intravenous Every 24 hours 02/10/24 0541  02/12/24 1621   02/09/24 2300  ceFEPIme (MAXIPIME) 2 g in sodium chloride 0.9 % 100 mL IVPB        2 g 200 mL/hr over 30 Minutes Intravenous  Once 02/09/24 2254 02/10/24 0019      Subjective: Patient seen and examined at bedside. Overnight events noted.   Patient reports doing better. Stent removal has been canceled today as UA shows yeast infection.   Family is at Bedside, all questions answered..  Objective: Vitals:    02/14/24 1315 02/14/24 2026 02/15/24 0638 02/15/24 1235  BP: 123/86 (!) 155/93 131/71 (!) 114/56  Pulse: 93 (!) 58 88 82  Resp: 20 18 17 18   Temp: 98.3 F (36.8 C) (!) 97.5 F (36.4 C) 99.4 F (37.4 C) 97.7 F (36.5 C)  TempSrc: Oral Oral Oral Oral  SpO2: 100% 99% 99% 100%  Weight:      Height:        Intake/Output Summary (Last 24 hours) at 02/15/2024 1324 Last data filed at 02/15/2024 1241 Gross per 24 hour  Intake 2083.02 ml  Output 2525 ml  Net -441.98 ml   Filed Weights   02/10/24 1200  Weight: 103.5 kg    Examination:  General exam: Appears calm and comfortable, not in any acute distress.  Respiratory system: Clear to auscultation. Respiratory effort normal. RR 15 Cardiovascular system: S1 & S2 heard, RRR. No JVD, murmurs, rubs, gallops or clicks.  Gastrointestinal system: Abdomen is non distended, soft and non tender. Normal bowel sounds heard. Central nervous system: Alert and oriented X 3. No focal neurological deficits. Extremities: No edema, No cyanosis, No clubbing. Skin: No rashes, lesions or ulcers Psychiatry: Judgement and insight appear normal. Mood & affect appropriate.     Data Reviewed: I have personally reviewed following labs and imaging studies  CBC: Recent Labs  Lab 02/09/24 2157 02/10/24 0606 02/11/24 0420 02/12/24 0416 02/13/24 0357 02/14/24 0355 02/15/24 0332  WBC 12.6*   < > 11.5* 15.4* 10.3 10.1 9.4  NEUTROABS 9.6*  --   --   --   --   --   --   HGB 10.8*   < > 9.4* 9.5* 8.8* 9.5* 9.4*  HCT 35.7*   < > 30.0* 31.7* 29.1* 30.5* 30.1*  MCV 102.0*   < > 98.0 103.6* 101.4* 99.3 100.3*  PLT 369   < > 334 300 292 343 353   < > = values in this interval not displayed.   Basic Metabolic Panel: Recent Labs  Lab 02/11/24 0420 02/12/24 0416 02/13/24 0357 02/14/24 0355 02/15/24 0332  NA 136 133* 135 141 144  K 5.1 5.0 4.5 4.3 4.0  CL 108 105 109 116* 114*  CO2 17* 14* 15* 13* 16*  GLUCOSE 120* 142* 106* 128* 136*  BUN 74* 77* 78* 76*  70*  CREATININE 4.35* 5.30* 5.15* 4.47* 3.79*  CALCIUM 8.4* 7.9* 8.0* 8.2* 7.9*  MG 1.6* 2.5* 2.3 1.9 1.6*  PHOS 4.4  --   --   --   --    GFR: Estimated Creatinine Clearance: 15.8 mL/min (A) (by C-G formula based on SCr of 3.79 mg/dL (H)). Liver Function Tests: Recent Labs  Lab 02/09/24 2157 02/10/24 0606  AST 61* 38  ALT 53* 43  ALKPHOS 46 34*  BILITOT 0.3 0.6  PROT 7.5 6.3*  ALBUMIN 2.7* 2.2*   No results for input(s): "LIPASE", "AMYLASE" in the last 168 hours. Recent Labs  Lab 02/13/24 0357  AMMONIA 35   Coagulation Profile:  Recent Labs  Lab 02/09/24 2157  INR 1.3*   Cardiac Enzymes: No results for input(s): "CKTOTAL", "CKMB", "CKMBINDEX", "TROPONINI" in the last 168 hours. BNP (last 3 results) No results for input(s): "PROBNP" in the last 8760 hours. HbA1C: No results for input(s): "HGBA1C" in the last 72 hours. CBG: Recent Labs  Lab 02/14/24 1154 02/14/24 1635 02/14/24 2027 02/15/24 0752 02/15/24 1229  GLUCAP 175* 124* 177* 141* 184*   Lipid Profile: No results for input(s): "CHOL", "HDL", "LDLCALC", "TRIG", "CHOLHDL", "LDLDIRECT" in the last 72 hours. Thyroid Function Tests: No results for input(s): "TSH", "T4TOTAL", "FREET4", "T3FREE", "THYROIDAB" in the last 72 hours. Anemia Panel: No results for input(s): "VITAMINB12", "FOLATE", "FERRITIN", "TIBC", "IRON", "RETICCTPCT" in the last 72 hours. Sepsis Labs: Recent Labs  Lab 02/09/24 2203 02/10/24 0618  LATICACIDVEN 2.1* 0.6    Recent Results (from the past 240 hours)  Urine Culture     Status: None   Collection Time: 02/09/24 12:00 AM   Specimen: Urine, Random  Result Value Ref Range Status   Specimen Description   Final    URINE, RANDOM Performed at [facility], [address] [place], [place] [address]    Special Requests   Final    NONE Reflexed from [id number] Performed at [facility], [address] [place], [place] [address]    Culture   Final     NO GROWTH Performed at [facility], [address], [place], [place] [address]    Report Status 02/11/2024 FINAL  Final  Culture, blood (Routine x 2)     Status: None (Preliminary result)   Collection Time: 02/09/24  9:54 PM   Specimen: BLOOD RIGHT FOREARM  Result Value Ref Range Status   Specimen Description   Final    BLOOD RIGHT FOREARM Performed at [facility], [address] [place], [place] [address]    Special Requests   Final    BOTTLES DRAWN AEROBIC AND ANAEROBIC Blood Culture results may not be optimal due to an inadequate volume of blood received in culture bottles Performed at [facility], [address], [place], [place] [address]    Culture   Final    NO GROWTH 4 DAYS Performed at [facility], [address], [place], [place] [address]    Report Status PENDING  Incomplete  Culture, blood (Routine x 2)     Status: None (Preliminary result)   Collection Time: 02/09/24 10:17 PM   Specimen: BLOOD LEFT FOREARM  Result Value Ref Range Status   Specimen Description   Final    BLOOD LEFT FOREARM Performed at [facility], [address] [place], [place] [address]    Special Requests   Final    BOTTLES DRAWN AEROBIC AND ANAEROBIC Blood Culture results may not be optimal due to an inadequate volume of blood received in culture bottles Performed at [facility], [address], [place], [place] [address]    Culture   Final    NO GROWTH 4 DAYS Performed at [facility], [address], [place], [place] [address]    Report Status PENDING  Incomplete  Urine Culture     Status: None   Collection Time: 02/09/24 10:53 PM   Specimen: Urine, Catheterized  Result Value Ref Range Status   Specimen Description   Final    URINE, CATHETERIZED Performed at [facility], [address], [place], [place] [address]  Special Requests   Final    NONE Performed at [facility], [address], [place], [place] [address]    Culture   Final    NO GROWTH Performed at [facility], [address] [place], [place] [address]    Report Status 02/11/2024 FINAL  Final  Resp panel by RT-PCR (RSV, Flu A&B, Covid) Anterior Nasal Swab     Status: None   Collection Time: 02/09/24 10:55 PM   Specimen: Anterior Nasal Swab  Result Value Ref Range Status   SARS Coronavirus 2 by RT PCR NEGATIVE NEGATIVE Final    Comment: (NOTE) SARS-CoV-2 target nucleic acids are NOT DETECTED.  The SARS-CoV-2 RNA is generally detectable in upper respiratory specimens during the acute phase of infection. The lowest concentration of SARS-CoV-2 viral copies this assay can detect is 138 copies/mL. A negative result does not preclude SARS-Cov-2 infection and should not be used as the sole basis for treatment or other patient management decisions. A negative result may occur with  improper specimen collection/handling, submission of specimen other than nasopharyngeal swab, presence of viral mutation(s) within the areas targeted by this assay, and inadequate number of viral copies(<138 copies/mL). A negative result must be combined with clinical observations, patient history, and epidemiological information. The expected result is Negative.  Fact Sheet for Patients:  BloggerCourse.com  Fact Sheet for Healthcare Providers:  SeriousBroker.it  This test is no t yet approved or cleared by the [place] FDA and  has been authorized for detection and/or diagnosis of SARS-CoV-2 by FDA under an Emergency Use Authorization (EUA). This EUA will remain  in effect (meaning this test can be used) for the duration of the COVID-19 declaration under Section 564(b)(1) of the Act, 21 U.S.C.section 360bbb-3(b)(1), unless the authorization is terminated  or revoked sooner.       Influenza A by PCR NEGATIVE NEGATIVE Final   Influenza B by  PCR NEGATIVE NEGATIVE Final    Comment: (NOTE) The Xpert Xpress SARS-CoV-2/FLU/RSV plus assay is intended as an aid in the diagnosis of influenza from Nasopharyngeal swab specimens and should not be used as a sole basis for treatment. Nasal washings and aspirates are unacceptable for Xpert Xpress SARS-CoV-2/FLU/RSV testing.  Fact Sheet for Patients: BloggerCourse.com  Fact Sheet for Healthcare Providers: SeriousBroker.it  This test is not yet approved or cleared by the [place] FDA and has been authorized for detection and/or diagnosis of SARS-CoV-2 by FDA under an Emergency Use Authorization (EUA). This EUA will remain in effect (meaning this test can be used) for the duration of the COVID-19 declaration under Section 564(b)(1) of the Act, 21 U.S.C. section 360bbb-3(b)(1), unless the authorization is terminated or revoked.     Resp Syncytial Virus by PCR NEGATIVE NEGATIVE Final    Comment: (NOTE) Fact Sheet for Patients: BloggerCourse.com  Fact Sheet for Healthcare Providers: SeriousBroker.it  This test is not yet approved or cleared by the [place] FDA and has been authorized for detection and/or diagnosis of SARS-CoV-2 by FDA under an Emergency Use Authorization (EUA). This EUA will remain in effect (meaning this test can be used) for the duration of the COVID-19 declaration under Section 564(b)(1) of the Act, 21 U.S.C. section 360bbb-3(b)(1), unless the authorization is terminated or revoked.  Performed at [facility], [address] [place], [place] [address]     Radiology Studies: No results found.  Scheduled Meds:  apixaban  2.5 mg Oral BID   carvedilol  6.25 mg  Oral BID WC   Chlorhexidine Gluconate Cloth  6 each Topical Daily   docusate sodium  100 mg Oral Daily   insulin aspart  0-5 Units Subcutaneous QHS   insulin aspart  0-9  Units Subcutaneous TID WC   latanoprost  1 drop Both Eyes QHS   loratadine  10 mg Oral Q48H   simvastatin  10 mg Oral Daily   sodium bicarbonate  1,300 mg Oral TID   Continuous Infusions:  cefTRIAXone (ROCEPHIN)  IV Stopped (02/14/24 1724)   fluconazole (DIFLUCAN) IV Stopped (02/14/24 1918)   magnesium sulfate bolus IVPB       LOS: 5 days    Time spent: 50 mins    Willeen Niece, MD [facility]   If 7PM-7AM, please contact night-coverage

## 2024-02-16 LAB — CBC
HCT: 28.7 % — ABNORMAL LOW (ref 39.0–52.0)
Hemoglobin: 8.7 g/dL — ABNORMAL LOW (ref 13.0–17.0)
MCH: 31.2 pg (ref 26.0–34.0)
MCHC: 30.3 g/dL (ref 30.0–36.0)
MCV: 102.9 fL — ABNORMAL HIGH (ref 80.0–100.0)
Platelets: 327 10*3/uL (ref 150–400)
RBC: 2.79 MIL/uL — ABNORMAL LOW (ref 4.22–5.81)
RDW: 12.3 % (ref 11.5–15.5)
WBC: 10.5 10*3/uL (ref 4.0–10.5)
nRBC: 0 % (ref 0.0–0.2)

## 2024-02-16 LAB — BASIC METABOLIC PANEL
Anion gap: 10 (ref 5–15)
BUN: 61 mg/dL — ABNORMAL HIGH (ref 8–23)
CO2: 17 mmol/L — ABNORMAL LOW (ref 22–32)
Calcium: 8 mg/dL — ABNORMAL LOW (ref 8.9–10.3)
Chloride: 111 mmol/L (ref 98–111)
Creatinine, Ser: 3.31 mg/dL — ABNORMAL HIGH (ref 0.61–1.24)
GFR, Estimated: 17 mL/min — ABNORMAL LOW (ref >60)
Glucose, Bld: 116 mg/dL — ABNORMAL HIGH (ref 70–99)
Potassium: 4.1 mmol/L (ref 3.5–5.1)
Sodium: 138 mmol/L (ref 135–145)

## 2024-02-16 LAB — HEMOGLOBIN AND HEMATOCRIT, BLOOD
HCT: 29 % — ABNORMAL LOW (ref 39.0–52.0)
Hemoglobin: 8.8 g/dL — ABNORMAL LOW (ref 13.0–17.0)

## 2024-02-16 LAB — GLUCOSE, CAPILLARY
Glucose-Capillary: 111 mg/dL — ABNORMAL HIGH (ref 70–99)
Glucose-Capillary: 157 mg/dL — ABNORMAL HIGH (ref 70–99)
Glucose-Capillary: 95 mg/dL (ref 70–99)
Glucose-Capillary: 155 mg/dL — ABNORMAL HIGH (ref 70–99)

## 2024-02-16 LAB — PHOSPHORUS: Phosphorus: 4.3 mg/dL (ref 2.5–4.6)

## 2024-02-16 LAB — MAGNESIUM: Magnesium: 1.7 mg/dL (ref 1.7–2.4)

## 2024-02-16 MED ORDER — OXYBUTYNIN CHLORIDE 5 MG PO TABS
5.0000 mg | ORAL_TABLET | Freq: Two times a day (BID) | ORAL | Status: DC
  Administered 2024-02-16 – 2024-02-20 (×8): 5 mg via ORAL
  Filled 2024-02-16 (×8): qty 1

## 2024-02-16 MED ORDER — FLUCONAZOLE 100 MG PO TABS
200.0000 mg | ORAL_TABLET | ORAL | Status: DC
  Administered 2024-02-16 – 2024-02-19 (×3): 200 mg via ORAL
  Filled 2024-02-16 (×3): qty 2

## 2024-02-16 NOTE — Progress Notes (Signed)
 PROGRESS NOTE    KEVIS QU  JYN:[id number] DOB: 1930/07/07 DOA: 02/09/2024 PCP: [facility]   Brief Narrative: This 89 year old Male with history of dementia, AAA, permanent A-fib on Eliquis, DM 2, HTN, impaired hearing, CVA, HLD, CKD 3B admitted to the hospital from 1/25 - 01/09/2024 for obstructive renal stone requiring emergent cystoscopy and left-sided stent placement.  Eventually discharged on 10 days of oral antibiotics and Foley catheter.  Now admitted for urosepsis,  initially on cefepime now transitioned to IV Rocephin.  Urology planning on stent removal on 02/15/24.  Creatinine remains elevated which has been chronic. Stent removal has been canceled until Tuesday due to UA showing yeast infection.     Assessment & Plan:   Principal Problem:   Complicated urinary tract infection Active Problems:   Type 2 diabetes mellitus (HCC)   Hyponatremia   Hyperkalemia   Hyperlipidemia   Metabolic acidosis  Sepsis secondary to complicated UTI: CAUTI: He is admitted for urosepsis.  Sepsis physiology slowly improving.   Abdominal x-ray showed left ureteral stent in good placement.   Reviewed by urology, no urgent intervention at this point.   Foley catheter replaced by the nurses on 3/2. Cultures are negative.  Antibiotics transitioned to ceftriaxone. Repeat UA due to cloudy urine, rediscussed with Dr Mena Goes. Stent removal has been delayed until Tuesday due to UA showing yeast infection Continue antibiotics and antifungals.   Permanent A-fib, intermittent RVR: HR controlled, Continue Coreg, Eliquis.   Chronic kidney disease stage IV- V: Metabolic acidosis: For the past several weeks patient's creatinine has been greater than 4, as had 7.3 back in January.   Follows at the [place]. creatinine now stable between 4.5-5.3.  Renal ultrasound showing mild bilateral hydronephrosis. otherwise unremarkable.  Continue sodium bicarb tablets.  Continue gentle hydration Can involve  nephrology if necessary but at this point I do not think he is really dialysis candidate.   Hyperkalemia: Now Improved.   Hypomagnesemia: Continue as needed repletion.    Non-insulin-dependent type 2 diabetes Last A1c 6.5 on 01/05/2024.  Placed on sensitive sliding scale insulin ACHS.   Hypertension: Continue Coreg IV as needed   Chronic anemia Hemoglobin stable, monitor labs.   Dementia: Delirium precautions. History of CVA Hyperlipidemia     DVT prophylaxis: Eliquis Code Status: DNR Family Communication: No family at bed side Disposition Plan:    Status is: Inpatient Remains inpatient appropriate because: Severity of illness.    Consultants:  Urology  Procedures:   Antimicrobials:  Anti-infectives (From admission, onward)    Start     Dose/Rate Route Frequency Ordered Stop   02/15/24 0845  cefTRIAXone (ROCEPHIN) 2 g in sodium chloride 0.9 % 100 mL IVPB  Status:  Discontinued        2 g 200 mL/hr over 30 Minutes Intravenous 30 min pre-op 02/14/24 1524 02/14/24 1526   02/14/24 1745  fluconazole (DIFLUCAN) IVPB 200 mg        200 mg 100 mL/hr over 60 Minutes Intravenous Every 24 hours 02/14/24 1650     02/12/24 1715  cefTRIAXone (ROCEPHIN) 1 g in sodium chloride 0.9 % 100 mL IVPB        1 g 200 mL/hr over 30 Minutes Intravenous Every 24 hours 02/12/24 1621     02/10/24 2200  ceFEPIme (MAXIPIME) 2 g in sodium chloride 0.9 % 100 mL IVPB  Status:  Discontinued        2 g 200 mL/hr over 30 Minutes Intravenous Every 24 hours 02/10/24 0541  02/12/24 1621   02/09/24 2300  ceFEPIme (MAXIPIME) 2 g in sodium chloride 0.9 % 100 mL IVPB        2 g 200 mL/hr over 30 Minutes Intravenous  Once 02/09/24 2254 02/10/24 0019      Subjective: Patient seen and examined at bedside. Overnight events noted.   Patient reports doing better , denies any pain while urination. Stent removal has been canceled today as UA shows yeast infection.   Daughter is at Bedside, all questions  answered..  Objective: Vitals:   02/15/24 0638 02/15/24 1235 02/15/24 2032 02/16/24 0423  BP: 131/71 (!) 114/56 125/72 133/76  Pulse: 88 82 89 89  Resp: 17 18 18 18   Temp: 99.4 F (37.4 C) 97.7 F (36.5 C) 98.5 F (36.9 C) 98.7 F (37.1 C)  TempSrc: Oral Oral Oral Oral  SpO2: 99% 100% 99% 97%  Weight:      Height:        Intake/Output Summary (Last 24 hours) at 02/16/2024 1107 Last data filed at 02/16/2024 0900 Gross per 24 hour  Intake 920.03 ml  Output 2550 ml  Net -1629.97 ml   Filed Weights   02/10/24 1200  Weight: 103.5 kg    Examination:  General exam: Appears calm and comfortable, not in any acute distress.  Respiratory system: Clear to auscultation. Respiratory effort normal. RR 15 Cardiovascular system: S1 & S2 heard, RRR. No JVD, murmurs, rubs, gallops or clicks.  Gastrointestinal system: Abdomen is non distended, soft and non tender. Normal bowel sounds heard. Central nervous system: Alert and oriented X 3. No focal neurological deficits. Extremities: No edema, No cyanosis, No clubbing. Skin: No rashes, lesions or ulcers Psychiatry: Judgement and insight appear normal. Mood & affect appropriate.     Data Reviewed: I have personally reviewed following labs and imaging studies  CBC: Recent Labs  Lab 02/09/24 2157 02/10/24 0606 02/12/24 0416 02/13/24 0357 02/14/24 0355 02/15/24 0332 02/16/24 0417 02/16/24 0912  WBC 12.6*   < > 15.4* 10.3 10.1 9.4 10.5  --   NEUTROABS 9.6*  --   --   --   --   --   --   --   HGB 10.8*   < > 9.5* 8.8* 9.5* 9.4* 8.7* 8.8*  HCT 35.7*   < > 31.7* 29.1* 30.5* 30.1* 28.7* 29.0*  MCV 102.0*   < > 103.6* 101.4* 99.3 100.3* 102.9*  --   PLT 369   < > 300 292 343 353 327  --    < > = values in this interval not displayed.   Basic Metabolic Panel: Recent Labs  Lab 02/11/24 0420 02/12/24 0416 02/13/24 0357 02/14/24 0355 02/15/24 0332 02/16/24 0417  NA 136 133* 135 141 144 138  K 5.1 5.0 4.5 4.3 4.0 4.1  CL 108 105  109 116* 114* 111  CO2 17* 14* 15* 13* 16* 17*  GLUCOSE 120* 142* 106* 128* 136* 116*  BUN 74* 77* 78* 76* 70* 61*  CREATININE 4.35* 5.30* 5.15* 4.47* 3.79* 3.31*  CALCIUM 8.4* 7.9* 8.0* 8.2* 7.9* 8.0*  MG 1.6* 2.5* 2.3 1.9 1.6* 1.7  PHOS 4.4  --   --   --   --  4.3   GFR: Estimated Creatinine Clearance: 18.1 mL/min (A) (by C-G formula based on SCr of 3.31 mg/dL (H)). Liver Function Tests: Recent Labs  Lab 02/09/24 2157 02/10/24 0606  AST 61* 38  ALT 53* 43  ALKPHOS 46 34*  BILITOT 0.3 0.6  PROT 7.5  6.3*  ALBUMIN 2.7* 2.2*   No results for input(s): "LIPASE", "AMYLASE" in the last 168 hours. Recent Labs  Lab 02/13/24 0357  AMMONIA 35   Coagulation Profile: Recent Labs  Lab 02/09/24 2157  INR 1.3*   Cardiac Enzymes: No results for input(s): "CKTOTAL", "CKMB", "CKMBINDEX", "TROPONINI" in the last 168 hours. BNP (last 3 results) No results for input(s): "PROBNP" in the last 8760 hours. HbA1C: No results for input(s): "HGBA1C" in the last 72 hours. CBG: Recent Labs  Lab 02/15/24 0752 02/15/24 1229 02/15/24 1737 02/15/24 2033 02/16/24 0723  GLUCAP 141* 184* 78 179* 111*   Lipid Profile: No results for input(s): "CHOL", "HDL", "LDLCALC", "TRIG", "CHOLHDL", "LDLDIRECT" in the last 72 hours. Thyroid Function Tests: No results for input(s): "TSH", "T4TOTAL", "FREET4", "T3FREE", "THYROIDAB" in the last 72 hours. Anemia Panel: No results for input(s): "VITAMINB12", "FOLATE", "FERRITIN", "TIBC", "IRON", "RETICCTPCT" in the last 72 hours. Sepsis Labs: Recent Labs  Lab 02/09/24 2203 02/10/24 0618  LATICACIDVEN 2.1* 0.6    Recent Results (from the past 240 hours)  Urine Culture     Status: None   Collection Time: 02/09/24 12:00 AM   Specimen: Urine, Random  Result Value Ref Range Status   Specimen Description   Final    URINE, RANDOM Performed at [facility], [address] [place], [place] [address]    Special Requests   Final    NONE  Reflexed from [phone] Performed at [facility], [address] [place], [place] [address]    Culture   Final    NO GROWTH Performed at [facility], [address] [place], [place] [address]    Report Status 02/11/2024 FINAL  Final  Culture, blood (Routine x 2)     Status: None   Collection Time: 02/09/24  9:54 PM   Specimen: BLOOD RIGHT FOREARM  Result Value Ref Range Status   Specimen Description   Final    BLOOD RIGHT FOREARM Performed at [facility], [address] [place], [place] [address]    Special Requests   Final    BOTTLES DRAWN AEROBIC AND ANAEROBIC Blood Culture results may not be optimal due to an inadequate volume of blood received in culture bottles Performed at [facility], [address] [place], [place] [address]    Culture   Final    NO GROWTH 5 DAYS Performed at [facility], [address], [place], [place] [address]    Report Status 02/15/2024 FINAL  Final  Culture, blood (Routine x 2)     Status: None   Collection Time: 02/09/24 10:17 PM   Specimen: BLOOD LEFT FOREARM  Result Value Ref Range Status   Specimen Description   Final    BLOOD LEFT FOREARM Performed at [facility], [address], [place], [place] [address]    Special Requests   Final    BOTTLES DRAWN AEROBIC AND ANAEROBIC Blood Culture results may not be optimal due to an inadequate volume of blood received in culture bottles Performed at [facility], [address], [place], [place] [address]    Culture   Final    NO GROWTH 5 DAYS Performed at [facility], [address] [place], [place] [address]    Report Status 02/15/2024 FINAL  Final  Urine Culture     Status: None   Collection Time: 02/09/24 10:53 PM   Specimen: Urine, Catheterized  Result  Value Ref Range Status   Specimen Description   Final    URINE, CATHETERIZED Performed at [facility], [address],  [place], [place] [address]    Special Requests   Final    NONE Performed at [facility], [address] [place], [place] [address]    Culture   Final    NO GROWTH Performed at [facility], [address] [place], [place] [address]    Report Status 02/11/2024 FINAL  Final  Resp panel by RT-PCR (RSV, Flu A&B, Covid) Anterior Nasal Swab     Status: None   Collection Time: 02/09/24 10:55 PM   Specimen: Anterior Nasal Swab  Result Value Ref Range Status   SARS Coronavirus 2 by RT PCR NEGATIVE NEGATIVE Final    Comment: (NOTE) SARS-CoV-2 target nucleic acids are NOT DETECTED.  The SARS-CoV-2 RNA is generally detectable in upper respiratory specimens during the acute phase of infection. The lowest concentration of SARS-CoV-2 viral copies this assay can detect is 138 copies/mL. A negative result does not preclude SARS-Cov-2 infection and should not be used as the sole basis for treatment or other patient management decisions. A negative result may occur with  improper specimen collection/handling, submission of specimen other than nasopharyngeal swab, presence of viral mutation(s) within the areas targeted by this assay, and inadequate number of viral copies(<138 copies/mL). A negative result must be combined with clinical observations, patient history, and epidemiological information. The expected result is Negative.  Fact Sheet for Patients:  BloggerCourse.com  Fact Sheet for Healthcare Providers:  SeriousBroker.it  This test is no t yet approved or cleared by the [place] FDA and  has been authorized for detection and/or diagnosis of SARS-CoV-2 by FDA under an Emergency Use Authorization (EUA). This EUA will remain  in effect (meaning this test can be used) for the duration of the COVID-19 declaration under Section 564(b)(1) of the Act, 21 U.S.C.section 360bbb-3(b)(1), unless the authorization is  terminated  or revoked sooner.       Influenza A by PCR NEGATIVE NEGATIVE Final   Influenza B by PCR NEGATIVE NEGATIVE Final    Comment: (NOTE) The Xpert Xpress SARS-CoV-2/FLU/RSV plus assay is intended as an aid in the diagnosis of influenza from Nasopharyngeal swab specimens and should not be used as a sole basis for treatment. Nasal washings and aspirates are unacceptable for Xpert Xpress SARS-CoV-2/FLU/RSV testing.  Fact Sheet for Patients: BloggerCourse.com  Fact Sheet for Healthcare Providers: SeriousBroker.it  This test is not yet approved or cleared by the [place] FDA and has been authorized for detection and/or diagnosis of SARS-CoV-2 by FDA under an Emergency Use Authorization (EUA). This EUA will remain in effect (meaning this test can be used) for the duration of the COVID-19 declaration under Section 564(b)(1) of the Act, 21 U.S.C. section 360bbb-3(b)(1), unless the authorization is terminated or revoked.     Resp Syncytial Virus by PCR NEGATIVE NEGATIVE Final    Comment: (NOTE) Fact Sheet for Patients: BloggerCourse.com  Fact Sheet for Healthcare Providers: SeriousBroker.it  This test is not yet approved or cleared by the [place] FDA and has been authorized for detection and/or diagnosis of SARS-CoV-2 by FDA under an Emergency Use Authorization (EUA). This EUA will remain in effect (meaning this test can be used) for the duration of the COVID-19 declaration under Section 564(b)(1) of the Act, 21 U.S.C. section 360bbb-3(b)(1), unless the authorization is terminated or revoked.  Performed at [facility],  [address], [place], [place] [address]     Radiology Studies: No results found.  Scheduled Meds:  apixaban  2.5 mg Oral BID   carvedilol  6.25 mg Oral BID WC   Chlorhexidine Gluconate Cloth  6 each Topical Daily    docusate sodium  100 mg Oral Daily   insulin aspart  0-5 Units Subcutaneous QHS   insulin aspart  0-9 Units Subcutaneous TID WC   latanoprost  1 drop Both Eyes QHS   loratadine  10 mg Oral Q48H   simvastatin  10 mg Oral Daily   sodium bicarbonate  1,300 mg Oral TID   Continuous Infusions:  cefTRIAXone (ROCEPHIN)  IV 1 g (02/15/24 1712)   fluconazole (DIFLUCAN) IV Stopped (02/15/24 1926)   magnesium sulfate bolus IVPB       LOS: 6 days    Time spent: 35 mins    Willeen Niece, MD [facility]   If 7PM-7AM, please contact night-coverage

## 2024-02-16 NOTE — Progress Notes (Signed)
 Physical Therapy Treatment Patient Details Name: Charles Richard MRN: [id number] DOB: [birth year] Today's Date: 02/16/2024   History of Present Illness 89 year old presents to the ED 02/09/24 via EMS with complaints of fever, dysuria, and cough and admitted for urosepsis. PMHx: dementia, AAA, permanent A-fib on Eliquis, DM 2, HTN, impaired hearing, CVA, HLD, CKD 3B.  Pt with recent admission 1/25 - 01/09/2024 for obstructive renal stone requiring emergent cystoscopy and left-sided stent placement.  Eventually discharged on 10 days of oral antibiotics and Foley catheter.    PT Comments  Pt assisted to bathroom and then ambulated short distance in hallway.  Pt returned to sitting on bed and then requested bathroom again.  Daughter present and has been assisting pt to/from bathroom today due to diarrhea.       If plan is discharge home, recommend the following: A little help with walking and/or transfers;A little help with bathing/dressing/bathroom;Assistance with cooking/housework;Assist for transportation;Help with stairs or ramp for entrance   Can travel by private vehicle        Equipment Recommendations  None recommended by PT    Recommendations for Other Services       Precautions / Restrictions Precautions Precautions: Fall Precaution/Restrictions Comments: likes to wear his shoes (grip)     Mobility  Bed Mobility Overal bed mobility: Needs Assistance Bed Mobility: Supine to Sit     Supine to sit: Min assist     General bed mobility comments: pt preferred assist with bil UEs (pull to sit upright); only light assist for trunk required as pt able to self assist with HHA    Transfers Overall transfer level: Needs assistance Equipment used: Rolling walker (2 wheels) Transfers: Sit to/from Stand Sit to Stand: Mod assist, +2 physical assistance           General transfer comment: verbal cues for hand placement; mod assist to rise (has a lift chair at home), pt able  control descent with UE support    Ambulation/Gait Ambulation/Gait assistance: Contact guard assist, Min assist Gait Distance (Feet): 30 Feet Assistive device: Rolling walker (2 wheels) Gait Pattern/deviations: Step-through pattern, Decreased dorsiflexion - right, Decreased dorsiflexion - left, Steppage Gait velocity: decr     General Gait Details: steppage gait observed likely for foot clearance (decr bil active DF), min assist with fatigue   Stairs             Wheelchair Mobility     Tilt Bed    Modified Rankin (Stroke Patients Only)       Balance Overall balance assessment: Needs assistance         Standing balance support: Bilateral upper extremity supported, Reliant on assistive device for balance, During functional activity Standing balance-Leahy Scale: Poor                              Communication Communication Communication: Impaired Factors Affecting Communication: Hearing impaired  Cognition Arousal: Alert Behavior During Therapy: WFL for tasks assessed/performed   PT - Cognitive impairments: No apparent impairments                         Following commands: Intact      Cueing Cueing Techniques: Verbal cues  Exercises      General Comments        Pertinent Vitals/Pain Pain Assessment Pain Assessment: No/denies pain    Home Living  Prior Function            PT Goals (current goals can now be found in the care plan section) Progress towards PT goals: Progressing toward goals    Frequency    Min 2X/week      PT Plan      Co-evaluation              AM-PAC PT "6 Clicks" Mobility   Outcome Measure  Help needed turning from your back to your side while in a flat bed without using bedrails?: A Little Help needed moving from lying on your back to sitting on the side of a flat bed without using bedrails?: A Lot Help needed moving to and from a bed to a chair  (including a wheelchair)?: A Lot Help needed standing up from a chair using your arms (e.g., wheelchair or bedside chair)?: A Lot Help needed to walk in hospital room?: A Little Help needed climbing 3-5 steps with a railing? : A Lot 6 Click Score: 14    End of Session Equipment Utilized During Treatment: Gait belt Activity Tolerance: Patient limited by fatigue Patient left: with family/visitor present (in bathroom with daughter) Nurse Communication: Mobility status (aware pt in bathroom, daughter has been assisting him to/from bathroom) PT Visit Diagnosis: Muscle weakness (generalized) (M62.81);Difficulty in walking, not elsewhere classified (R26.2)     Time: 1610-9604 PT Time Calculation (min) (ACUTE ONLY): 16 min  Charges:    $Gait Training: 8-22 mins PT General Charges $$ ACUTE PT VISIT: 1 Visit                     Paulino Door, DPT Physical Therapist Acute Rehabilitation Services Office: [phone]    Janan Halter [place] 02/16/2024, 4:04 PM

## 2024-02-17 LAB — BASIC METABOLIC PANEL
Anion gap: 10 (ref 5–15)
BUN: 59 mg/dL — ABNORMAL HIGH (ref 8–23)
CO2: 19 mmol/L — ABNORMAL LOW (ref 22–32)
Calcium: 8.3 mg/dL — ABNORMAL LOW (ref 8.9–10.3)
Chloride: 111 mmol/L (ref 98–111)
Creatinine, Ser: 2.98 mg/dL — ABNORMAL HIGH (ref 0.61–1.24)
GFR, Estimated: 19 mL/min — ABNORMAL LOW (ref >60)
Glucose, Bld: 121 mg/dL — ABNORMAL HIGH (ref 70–99)
Potassium: 3.9 mmol/L (ref 3.5–5.1)
Sodium: 140 mmol/L (ref 135–145)

## 2024-02-17 LAB — URINE CULTURE

## 2024-02-17 LAB — CBC
HCT: 27.1 % — ABNORMAL LOW (ref 39.0–52.0)
Hemoglobin: 8.7 g/dL — ABNORMAL LOW (ref 13.0–17.0)
MCH: 31.3 pg (ref 26.0–34.0)
MCHC: 32.1 g/dL (ref 30.0–36.0)
MCV: 97.5 fL (ref 80.0–100.0)
Platelets: 323 10*3/uL (ref 150–400)
RBC: 2.78 MIL/uL — ABNORMAL LOW (ref 4.22–5.81)
RDW: 12.1 % (ref 11.5–15.5)
WBC: 11.3 10*3/uL — ABNORMAL HIGH (ref 4.0–10.5)
nRBC: 0 % (ref 0.0–0.2)

## 2024-02-17 LAB — GLUCOSE, CAPILLARY
Glucose-Capillary: 138 mg/dL — ABNORMAL HIGH (ref 70–99)
Glucose-Capillary: 130 mg/dL — ABNORMAL HIGH (ref 70–99)
Glucose-Capillary: 171 mg/dL — ABNORMAL HIGH (ref 70–99)
Glucose-Capillary: 120 mg/dL — ABNORMAL HIGH (ref 70–99)

## 2024-02-17 LAB — MAGNESIUM: Magnesium: 1.7 mg/dL (ref 1.7–2.4)

## 2024-02-17 NOTE — Progress Notes (Signed)
 I was able to reschedule patient for [facility], 02/18/2024 at 4 PM for his ureteroscopy. Consent and NPO order placed. Continue antifungal and antibiotics. Appreciate excellent TRH and nursing care.

## 2024-02-17 NOTE — Plan of Care (Signed)
  Problem: Education: Goal: Ability to describe self-care measures that may prevent or decrease complications (Diabetes Survival Skills Education) will improve Outcome: Progressing Goal: Individualized Educational Video(s) Outcome: Progressing   Problem: Coping: Goal: Ability to adjust to condition or change in health will improve Outcome: Progressing   Problem: Fluid Volume: Goal: Ability to maintain a balanced intake and output will improve Outcome: Progressing   Problem: Health Behavior/Discharge Planning: Goal: Ability to identify and utilize available resources and services will improve Outcome: Progressing Goal: Ability to manage health-related needs will improve Outcome: Progressing   Problem: Metabolic: Goal: Ability to maintain appropriate glucose levels will improve Outcome: Progressing   Problem: Nutritional: Goal: Maintenance of adequate nutrition will improve Outcome: Progressing Goal: Progress toward achieving an optimal weight will improve Outcome: Progressing   Problem: Skin Integrity: Goal: Risk for impaired skin integrity will decrease Outcome: Progressing   Problem: Tissue Perfusion: Goal: Adequacy of tissue perfusion will improve Outcome: Progressing   Problem: Health Behavior/Discharge Planning: Goal: Ability to manage health-related needs will improve Outcome: Progressing   Problem: Clinical Measurements: Goal: Ability to maintain clinical measurements within normal limits will improve Outcome: Progressing Goal: Will remain free from infection Outcome: Progressing Goal: Diagnostic test results will improve Outcome: Progressing Goal: Respiratory complications will improve Outcome: Progressing Goal: Cardiovascular complication will be avoided Outcome: Progressing   Problem: Activity: Goal: Risk for activity intolerance will decrease Outcome: Progressing   Problem: Nutrition: Goal: Adequate nutrition will be maintained Outcome:  Progressing   Problem: Coping: Goal: Level of anxiety will decrease Outcome: Progressing   Problem: Elimination: Goal: Will not experience complications related to bowel motility Outcome: Progressing Goal: Will not experience complications related to urinary retention Outcome: Progressing   Problem: Pain Managment: Goal: General experience of comfort will improve and/or be controlled Outcome: Progressing   Problem: Safety: Goal: Ability to remain free from injury will improve Outcome: Progressing   Problem: Skin Integrity: Goal: Risk for impaired skin integrity will decrease Outcome: Progressing

## 2024-02-17 NOTE — Progress Notes (Signed)
 PROGRESS NOTE    Charles Richard  GNF:[id number] DOB: 1930/11/02 DOA: 02/09/2024 PCP: [facility]   Brief Narrative: This 89 year old Male with history of dementia, AAA, permanent A-fib on Eliquis, DM 2, HTN, impaired hearing, CVA, HLD, CKD 3B admitted to the hospital from 1/25 - 01/09/2024 for obstructive renal stone requiring emergent cystoscopy and left-sided stent placement.  Eventually discharged on 10 days of oral antibiotics and Foley catheter.  Now admitted for urosepsis,  initially on cefepime now transitioned to IV Rocephin.  Urology was planning on stent removal on 02/15/24.  Creatinine remains elevated which has been chronic. Stent removal has been canceled until Tuesday due to UA showing yeast infection.     Assessment & Plan:   Principal Problem:   Complicated urinary tract infection Active Problems:   Type 2 diabetes mellitus (HCC)   Hyponatremia   Hyperkalemia   Hyperlipidemia   Metabolic acidosis  Sepsis secondary to complicated UTI: CAUTI: He is admitted for urosepsis. Sepsis physiology slowly improving.   Abdominal x-ray showed left ureteral stent in good placement.   Reviewed by urology, no urgent intervention at this point.   Foley catheter replaced by the nurses on 02/10/24. Cultures are negative.  Antibiotics transitioned to ceftriaxone. Repeat UA due to cloudy urine, rediscussed with Dr Mena Goes. Stent removal has been delayed until Tuesday due to UA showing yeast infection Continue antibiotics and antifungals.   Permanent A-fib, intermittent RVR: HR controlled, Continue Coreg, Eliquis.   Chronic kidney disease stage IV- V: Metabolic acidosis: For the past several weeks,  patient's creatinine has been greater than 4, has had 7.3 back in January.   Follows at the [place]. creatinine now stable between 4.5-5.3.   Renal ultrasound showing mild bilateral hydronephrosis. otherwise unremarkable.  Continue sodium bicarb tablets.  Continue gentle  hydration Can involve nephrology if necessary but at this point I do not think he is really dialysis candidate.   Hyperkalemia: Now Improved.   Hypomagnesemia: Continue as needed repletion.    Non-insulin-dependent type 2 diabetes Last A1c 6.5 on 01/05/2024.   Placed on sensitive sliding scale insulin ACHS.   Hypertension: Continue Coreg IV as needed.   Chronic anemia: Hemoglobin stable, monitor labs.   Dementia: Delirium precautions. History of CVA Hyperlipidemia     DVT prophylaxis: Eliquis Code Status: DNR Family Communication: Daughter at bed side Disposition Plan:    Status is: Inpatient Remains inpatient appropriate because: Severity of illness.    Consultants:  Urology  Procedures:   Antimicrobials:  Anti-infectives (From admission, onward)    Start     Dose/Rate Route Frequency Ordered Stop   02/16/24 1700  fluconazole (DIFLUCAN) tablet 200 mg        200 mg Oral Every 24 hours 02/16/24 1226     02/15/24 0845  cefTRIAXone (ROCEPHIN) 2 g in sodium chloride 0.9 % 100 mL IVPB  Status:  Discontinued        2 g 200 mL/hr over 30 Minutes Intravenous 30 min pre-op 02/14/24 1524 02/14/24 1526   02/14/24 1745  fluconazole (DIFLUCAN) IVPB 200 mg  Status:  Discontinued        200 mg 100 mL/hr over 60 Minutes Intravenous Every 24 hours 02/14/24 1650 02/16/24 1226   02/12/24 1715  cefTRIAXone (ROCEPHIN) 1 g in sodium chloride 0.9 % 100 mL IVPB        1 g 200 mL/hr over 30 Minutes Intravenous Every 24 hours 02/12/24 1621     02/10/24 2200  ceFEPIme (MAXIPIME) 2  g in sodium chloride 0.9 % 100 mL IVPB  Status:  Discontinued        2 g 200 mL/hr over 30 Minutes Intravenous Every 24 hours 02/10/24 0541 02/12/24 1621   02/09/24 2300  ceFEPIme (MAXIPIME) 2 g in sodium chloride 0.9 % 100 mL IVPB        2 g 200 mL/hr over 30 Minutes Intravenous  Once 02/09/24 2254 02/10/24 0019      Subjective: Patient seen and examined at bedside. Overnight events noted.   Patient  reports feeling improved.  Denies any pain while urination. Stent removal has been canceled until tuesday as UA shows yeast infection.   Daughter is at Bedside, all questions answered.  Objective: Vitals:   02/16/24 1315 02/16/24 2010 02/17/24 0434 02/17/24 0927  BP: 109/66 136/73 133/82 118/78  Pulse: 69 87 93 (!) 45  Resp: 14 20 16    Temp: 98.9 F (37.2 C) 99 F (37.2 C) 98.4 F (36.9 C) 99 F (37.2 C)  TempSrc: Oral Oral Oral Oral  SpO2: 100% 99% 98% 98%  Weight:      Height:        Intake/Output Summary (Last 24 hours) at 02/17/2024 1153 Last data filed at 02/17/2024 0946 Gross per 24 hour  Intake 360 ml  Output 2200 ml  Net -1840 ml   Filed Weights   02/10/24 1200  Weight: 103.5 kg    Examination:  General exam: Appears calm and comfortable, not in any acute distress.  Respiratory system: CTA Bilaterally . Respiratory effort normal. RR 15 Cardiovascular system: S1 & S2 heard, RRR. No JVD, murmurs, rubs, gallops or clicks.  Gastrointestinal system: Abdomen is non distended, soft and non tender. Normal bowel sounds heard. Central nervous system: Alert and oriented X 3. No focal neurological deficits. Extremities: No edema, No cyanosis, No clubbing. Skin: No rashes, lesions or ulcers Psychiatry: Judgement and insight appear normal. Mood & affect appropriate.     Data Reviewed: I have personally reviewed following labs and imaging studies  CBC: Recent Labs  Lab 02/13/24 0357 02/14/24 0355 02/15/24 0332 02/16/24 0417 02/16/24 0912 02/17/24 0418  WBC 10.3 10.1 9.4 10.5  --  11.3*  HGB 8.8* 9.5* 9.4* 8.7* 8.8* 8.7*  HCT 29.1* 30.5* 30.1* 28.7* 29.0* 27.1*  MCV 101.4* 99.3 100.3* 102.9*  --  97.5  PLT 292 343 353 327  --  323   Basic Metabolic Panel: Recent Labs  Lab 02/11/24 0420 02/12/24 0416 02/13/24 0357 02/14/24 0355 02/15/24 0332 02/16/24 0417 02/17/24 0418  NA 136   < > 135 141 144 138 140  K 5.1   < > 4.5 4.3 4.0 4.1 3.9  CL 108   < > 109  116* 114* 111 111  CO2 17*   < > 15* 13* 16* 17* 19*  GLUCOSE 120*   < > 106* 128* 136* 116* 121*  BUN 74*   < > 78* 76* 70* 61* 59*  CREATININE 4.35*   < > 5.15* 4.47* 3.79* 3.31* 2.98*  CALCIUM 8.4*   < > 8.0* 8.2* 7.9* 8.0* 8.3*  MG 1.6*   < > 2.3 1.9 1.6* 1.7 1.7  PHOS 4.4  --   --   --   --  4.3  --    < > = values in this interval not displayed.   GFR: Estimated Creatinine Clearance: 20.1 mL/min (A) (by C-G formula based on SCr of 2.98 mg/dL (H)). Liver Function Tests: No results for input(s): "AST", "  ALT", "ALKPHOS", "BILITOT", "PROT", "ALBUMIN" in the last 168 hours.  No results for input(s): "LIPASE", "AMYLASE" in the last 168 hours. Recent Labs  Lab 02/13/24 0357  AMMONIA 35   Coagulation Profile: No results for input(s): "INR", "PROTIME" in the last 168 hours.  Cardiac Enzymes: No results for input(s): "CKTOTAL", "CKMB", "CKMBINDEX", "TROPONINI" in the last 168 hours. BNP (last 3 results) No results for input(s): "PROBNP" in the last 8760 hours. HbA1C: No results for input(s): "HGBA1C" in the last 72 hours. CBG: Recent Labs  Lab 02/16/24 0723 02/16/24 1134 02/16/24 1707 02/16/24 2010 02/17/24 0829  GLUCAP 111* 157* 95 155* 138*   Lipid Profile: No results for input(s): "CHOL", "HDL", "LDLCALC", "TRIG", "CHOLHDL", "LDLDIRECT" in the last 72 hours. Thyroid Function Tests: No results for input(s): "TSH", "T4TOTAL", "FREET4", "T3FREE", "THYROIDAB" in the last 72 hours. Anemia Panel: No results for input(s): "VITAMINB12", "FOLATE", "FERRITIN", "TIBC", "IRON", "RETICCTPCT" in the last 72 hours. Sepsis Labs: No results for input(s): "PROCALCITON", "LATICACIDVEN" in the last 168 hours.   Recent Results (from the past 240 hours)  Urine Culture     Status: None   Collection Time: 02/09/24 12:00 AM   Specimen: Urine, Random  Result Value Ref Range Status   Specimen Description   Final    URINE, RANDOM Performed at [facility], [address] [place], [place] [address]    Special Requests   Final    NONE Reflexed from [id number] Performed at [facility], [address], [place], [place] [address]    Culture   Final    NO GROWTH Performed at [facility], [address] [place], [place] [address]    Report Status 02/11/2024 FINAL  Final  Culture, blood (Routine x 2)     Status: None   Collection Time: 02/09/24  9:54 PM   Specimen: BLOOD RIGHT FOREARM  Result Value Ref Range Status   Specimen Description   Final    BLOOD RIGHT FOREARM Performed at [facility], [address] [place], [place] [address]    Special Requests   Final    BOTTLES DRAWN AEROBIC AND ANAEROBIC Blood Culture results may not be optimal due to an inadequate volume of blood received in culture bottles Performed at [facility], [address], [place], [place] [address]    Culture   Final    NO GROWTH 5 DAYS Performed at [facility], [address] [place], [place] [address]    Report Status 02/15/2024 FINAL  Final  Culture, blood (Routine x 2)     Status: None   Collection Time: 02/09/24 10:17 PM   Specimen: BLOOD LEFT FOREARM  Result Value Ref Range Status   Specimen Description   Final    BLOOD LEFT FOREARM Performed at [facility], [address] [place], [place] [address]    Special Requests   Final    BOTTLES DRAWN AEROBIC AND ANAEROBIC Blood Culture results may not be optimal due to an inadequate volume of blood received in culture bottles Performed at [facility], [address] [place], [place] [address]    Culture   Final    NO GROWTH 5 DAYS Performed at [facility], [address], [place], [place] [address]    Report Status 02/15/2024 FINAL  Final  Urine Culture     Status: None   Collection Time: 02/09/24 10:53 PM   Specimen: Urine,  Catheterized  Result Value Ref Range Status   Specimen Description   Final    URINE,  CATHETERIZED Performed at [facility], [address] [place], [place] [address]    Special Requests   Final    NONE Performed at [facility], [address] [place], [place] [address]    Culture   Final    NO GROWTH Performed at [facility], [address] [place], [place] [address]    Report Status 02/11/2024 FINAL  Final  Resp panel by RT-PCR (RSV, Flu A&B, Covid) Anterior Nasal Swab     Status: None   Collection Time: 02/09/24 10:55 PM   Specimen: Anterior Nasal Swab  Result Value Ref Range Status   SARS Coronavirus 2 by RT PCR NEGATIVE NEGATIVE Final    Comment: (NOTE) SARS-CoV-2 target nucleic acids are NOT DETECTED.  The SARS-CoV-2 RNA is generally detectable in upper respiratory specimens during the acute phase of infection. The lowest concentration of SARS-CoV-2 viral copies this assay can detect is 138 copies/mL. A negative result does not preclude SARS-Cov-2 infection and should not be used as the sole basis for treatment or other patient management decisions. A negative result may occur with  improper specimen collection/handling, submission of specimen other than nasopharyngeal swab, presence of viral mutation(s) within the areas targeted by this assay, and inadequate number of viral copies(<138 copies/mL). A negative result must be combined with clinical observations, patient history, and epidemiological information. The expected result is Negative.  Fact Sheet for Patients:  BloggerCourse.com  Fact Sheet for Healthcare Providers:  SeriousBroker.it  This test is no t yet approved or cleared by the [place] FDA and  has been authorized for detection and/or diagnosis of SARS-CoV-2 by FDA under an Emergency Use Authorization (EUA). This EUA will remain  in effect (meaning this test can be used) for the duration of the COVID-19 declaration under Section  564(b)(1) of the Act, 21 U.S.C.section 360bbb-3(b)(1), unless the authorization is terminated  or revoked sooner.       Influenza A by PCR NEGATIVE NEGATIVE Final   Influenza B by PCR NEGATIVE NEGATIVE Final    Comment: (NOTE) The Xpert Xpress SARS-CoV-2/FLU/RSV plus assay is intended as an aid in the diagnosis of influenza from Nasopharyngeal swab specimens and should not be used as a sole basis for treatment. Nasal washings and aspirates are unacceptable for Xpert Xpress SARS-CoV-2/FLU/RSV testing.  Fact Sheet for Patients: BloggerCourse.com  Fact Sheet for Healthcare Providers: SeriousBroker.it  This test is not yet approved or cleared by the [place] FDA and has been authorized for detection and/or diagnosis of SARS-CoV-2 by FDA under an Emergency Use Authorization (EUA). This EUA will remain in effect (meaning this test can be used) for the duration of the COVID-19 declaration under Section 564(b)(1) of the Act, 21 U.S.C. section 360bbb-3(b)(1), unless the authorization is terminated or revoked.     Resp Syncytial Virus by PCR NEGATIVE NEGATIVE Final    Comment: (NOTE) Fact Sheet for Patients: BloggerCourse.com  Fact Sheet for Healthcare Providers: SeriousBroker.it  This test is not yet approved or cleared by the [place] FDA and has been authorized for detection and/or diagnosis of SARS-CoV-2 by FDA under an Emergency Use Authorization (EUA). This EUA will remain in effect (meaning this test can be used) for the duration of the COVID-19 declaration under Section 564(b)(1) of the Act, 21 U.S.C. section 360bbb-3(b)(1), unless the authorization is terminated or revoked.  Performed at [facility], [address] [place], [place] [address]   Urine Culture (for pregnant, neutropenic or urologic patients or patients with an indwelling urinary  catheter)     Status: None (Preliminary result)   Collection Time: 02/14/24 12:28 PM   Specimen: Urine, Clean Catch  Result Value Ref Range Status   Specimen Description   Final    URINE, CLEAN CATCH Performed at [facility], [address], [place], [place] [address]    Special Requests   Final    NONE Performed at [facility], [address] [place], [place] [address]    Culture   Final    CULTURE REINCUBATED FOR BETTER GROWTH Performed at [facility], [address] [place], [place] [address]    Report Status PENDING  Incomplete    Radiology Studies: No results found.  Scheduled Meds:  apixaban  2.5 mg Oral BID   carvedilol  6.25 mg Oral BID WC   Chlorhexidine Gluconate Cloth  6 each Topical Daily   docusate sodium  100 mg Oral Daily   fluconazole  200 mg Oral Q24H   insulin aspart  0-5 Units Subcutaneous QHS   insulin aspart  0-9 Units Subcutaneous TID WC   latanoprost  1 drop Both Eyes QHS   loratadine  10 mg Oral Q48H   oxybutynin  5 mg Oral BID   simvastatin  10 mg Oral Daily   sodium bicarbonate  1,300 mg Oral TID   Continuous Infusions:  cefTRIAXone (ROCEPHIN)  IV 1 g (02/16/24 1709)   magnesium sulfate bolus IVPB       LOS: 7 days    Time spent: 35 mins    Willeen Niece, MD [facility]   If 7PM-7AM, please contact night-coverage

## 2024-02-18 ENCOUNTER — Inpatient Hospital Stay (HOSPITAL_COMMUNITY): Admitting: Anesthesiology

## 2024-02-18 ENCOUNTER — Encounter (HOSPITAL_COMMUNITY): Payer: Self-pay | Admitting: Internal Medicine

## 2024-02-18 ENCOUNTER — Encounter (HOSPITAL_COMMUNITY)
Admission: EM | Disposition: A | Discharge status: Home Health | Payer: Self-pay | Source: Home / Self Care | Attending: Internal Medicine

## 2024-02-18 ENCOUNTER — Inpatient Hospital Stay (HOSPITAL_COMMUNITY)

## 2024-02-18 DIAGNOSIS — N390 Urinary tract infection, site not specified: Secondary | ICD-10-CM | POA: Diagnosis not present

## 2024-02-18 DIAGNOSIS — I2510 Atherosclerotic heart disease of native coronary artery without angina pectoris: Secondary | ICD-10-CM

## 2024-02-18 DIAGNOSIS — N202 Calculus of kidney with calculus of ureter: Secondary | ICD-10-CM | POA: Diagnosis not present

## 2024-02-18 DIAGNOSIS — E119 Type 2 diabetes mellitus without complications: Secondary | ICD-10-CM | POA: Diagnosis not present

## 2024-02-18 DIAGNOSIS — I4891 Unspecified atrial fibrillation: Secondary | ICD-10-CM | POA: Diagnosis not present

## 2024-02-18 HISTORY — PX: CYSTOSCOPY/URETEROSCOPY/HOLMIUM LASER/STENT PLACEMENT: SHX6546

## 2024-02-18 HISTORY — PX: CYSTOSCOPY W/ RETROGRADES: SHX1426

## 2024-02-18 LAB — BASIC METABOLIC PANEL
Anion gap: 10 (ref 5–15)
BUN: 55 mg/dL — ABNORMAL HIGH (ref 8–23)
CO2: 20 mmol/L — ABNORMAL LOW (ref 22–32)
Calcium: 8.3 mg/dL — ABNORMAL LOW (ref 8.9–10.3)
Chloride: 110 mmol/L (ref 98–111)
Creatinine, Ser: 3.01 mg/dL — ABNORMAL HIGH (ref 0.61–1.24)
GFR, Estimated: 19 mL/min — ABNORMAL LOW (ref >60)
Glucose, Bld: 128 mg/dL — ABNORMAL HIGH (ref 70–99)
Potassium: 4 mmol/L (ref 3.5–5.1)
Sodium: 140 mmol/L (ref 135–145)

## 2024-02-18 LAB — GLUCOSE, CAPILLARY
Glucose-Capillary: 125 mg/dL — ABNORMAL HIGH (ref 70–99)
Glucose-Capillary: 138 mg/dL — ABNORMAL HIGH (ref 70–99)
Glucose-Capillary: 107 mg/dL — ABNORMAL HIGH (ref 70–99)

## 2024-02-18 LAB — CBC
HCT: 28.7 % — ABNORMAL LOW (ref 39.0–52.0)
Hemoglobin: 8.8 g/dL — ABNORMAL LOW (ref 13.0–17.0)
MCH: 30.9 pg (ref 26.0–34.0)
MCHC: 30.7 g/dL (ref 30.0–36.0)
MCV: 100.7 fL — ABNORMAL HIGH (ref 80.0–100.0)
Platelets: 352 10*3/uL (ref 150–400)
RBC: 2.85 MIL/uL — ABNORMAL LOW (ref 4.22–5.81)
RDW: 12.3 % (ref 11.5–15.5)
WBC: 11.8 10*3/uL — ABNORMAL HIGH (ref 4.0–10.5)
nRBC: 0 % (ref 0.0–0.2)

## 2024-02-18 LAB — SURGICAL PCR SCREEN
MRSA, PCR: NEGATIVE
Staphylococcus aureus: NEGATIVE

## 2024-02-18 LAB — PHOSPHORUS: Phosphorus: 4.4 mg/dL (ref 2.5–4.6)

## 2024-02-18 LAB — MAGNESIUM: Magnesium: 1.5 mg/dL — ABNORMAL LOW (ref 1.7–2.4)

## 2024-02-18 SURGERY — CYSTOSCOPY/URETEROSCOPY/HOLMIUM LASER/STENT PLACEMENT
Anesthesia: General | Site: Ureter | Laterality: Left

## 2024-02-18 MED ORDER — FENTANYL CITRATE PF 50 MCG/ML IJ SOSY: 25.0000 ug | PREFILLED_SYRINGE | INTRAMUSCULAR | Status: DC | PRN

## 2024-02-18 MED ORDER — LACTATED RINGERS IV SOLN: INTRAVENOUS | Status: DC

## 2024-02-18 MED ORDER — ONDANSETRON HCL 4 MG/2ML IJ SOLN
INTRAMUSCULAR | Status: DC | PRN
Start: 2024-02-18 — End: 2024-02-18
  Administered 2024-02-18: 4 mg via INTRAVENOUS

## 2024-02-18 MED ORDER — FENTANYL CITRATE (PF) 100 MCG/2ML IJ SOLN
INTRAMUSCULAR | Status: DC | PRN
  Administered 2024-02-18: 50 ug via INTRAVENOUS

## 2024-02-18 MED ORDER — SODIUM CHLORIDE 0.9 % IR SOLN
Status: DC | PRN
  Administered 2024-02-18: 3000 mL

## 2024-02-18 MED ORDER — FLUCONAZOLE IN SODIUM CHLORIDE 200-0.9 MG/100ML-% IV SOLN
INTRAVENOUS | Status: DC | PRN
  Administered 2024-02-18: 200 mg via INTRAVENOUS

## 2024-02-18 MED ORDER — LACTATED RINGERS IV SOLN: INTRAVENOUS | Status: DC | PRN

## 2024-02-18 MED ORDER — DEXTROSE 5 % IV SOLN
INTRAVENOUS | Status: DC | PRN
  Administered 2024-02-18: 1 g via INTRAVENOUS

## 2024-02-18 MED ORDER — FLUCONAZOLE IN SODIUM CHLORIDE 200-0.9 MG/100ML-% IV SOLN
200.0000 mg | Freq: Once | INTRAVENOUS | Status: DC
  Filled 2024-02-18: qty 100

## 2024-02-18 MED ORDER — PHENYLEPHRINE 80 MCG/ML (10ML) SYRINGE FOR IV PUSH (FOR BLOOD PRESSURE SUPPORT)
PREFILLED_SYRINGE | INTRAVENOUS | Status: DC | PRN
  Administered 2024-02-18: 80 ug via INTRAVENOUS
  Administered 2024-02-18 (×2): 40 ug via INTRAVENOUS

## 2024-02-18 MED ORDER — PHENYLEPHRINE 80 MCG/ML (10ML) SYRINGE FOR IV PUSH (FOR BLOOD PRESSURE SUPPORT)
PREFILLED_SYRINGE | INTRAVENOUS | Status: AC
  Filled 2024-02-18: qty 10

## 2024-02-18 MED ORDER — LIDOCAINE HCL (CARDIAC) PF 100 MG/5ML IV SOSY
PREFILLED_SYRINGE | INTRAVENOUS | Status: DC | PRN
Start: 2024-02-18 — End: 2024-02-18
  Administered 2024-02-18: 100 mg via INTRATRACHEAL

## 2024-02-18 MED ORDER — LIDOCAINE HCL (PF) 2 % IJ SOLN
INTRAMUSCULAR | Status: AC
  Filled 2024-02-18: qty 5

## 2024-02-18 MED ORDER — PROPOFOL 10 MG/ML IV BOLUS
INTRAVENOUS | Status: AC
  Filled 2024-02-18: qty 20

## 2024-02-18 MED ORDER — SODIUM CHLORIDE 0.9 % IV SOLN
INTRAVENOUS | Status: AC
  Filled 2024-02-18: qty 20

## 2024-02-18 MED ORDER — SODIUM CHLORIDE 0.9 % IV SOLN: 1.0000 g | Freq: Once | INTRAVENOUS | Status: DC

## 2024-02-18 MED ORDER — POLYETHYLENE GLYCOL 3350 17 G PO PACK: 17.0000 g | PACK | Freq: Every day | ORAL | Status: DC | PRN

## 2024-02-18 MED ORDER — ONDANSETRON HCL 4 MG/2ML IJ SOLN
INTRAMUSCULAR | Status: AC
  Filled 2024-02-18: qty 2

## 2024-02-18 MED ORDER — CHLORHEXIDINE GLUCONATE 0.12 % MT SOLN
15.0000 mL | Freq: Once | OROMUCOSAL | Status: AC
  Administered 2024-02-18: 15 mL via OROMUCOSAL

## 2024-02-18 MED ORDER — FENTANYL CITRATE (PF) 100 MCG/2ML IJ SOLN
INTRAMUSCULAR | Status: AC
  Filled 2024-02-18: qty 2

## 2024-02-18 MED ORDER — SODIUM CHLORIDE 0.9 % IV SOLN: INTRAVENOUS | Status: AC

## 2024-02-18 MED ORDER — PROPOFOL 10 MG/ML IV BOLUS
INTRAVENOUS | Status: DC | PRN
  Administered 2024-02-18: 50 mg via INTRAVENOUS

## 2024-02-18 SURGICAL SUPPLY — 25 items
BAG URINE DRAIN 2000ML AR STRL (UROLOGICAL SUPPLIES) IMPLANT
BAG URO CATCHER STRL LF (MISCELLANEOUS) ×2 IMPLANT
BASKET ZERO TIP NITINOL 2.4FR (BASKET) IMPLANT
CATH FOLEY 2WAY SLVR 5CC 16FR (CATHETERS) IMPLANT
CATH URETL OPEN END 6FR 70 (CATHETERS) ×2 IMPLANT
CLOTH BEACON ORANGE TIMEOUT ST (SAFETY) ×2 IMPLANT
EXTRACTOR STONE 1.7FRX115CM (UROLOGICAL SUPPLIES) IMPLANT
FIBER LASER MOSES 200 DFL (Laser) IMPLANT
FIBER LASER MOSES 365 DFL (Laser) IMPLANT
GLOVE BIO SURGEON STRL SZ7.5 (GLOVE) ×2 IMPLANT
GLOVE SURG LX STRL 7.5 STRW (GLOVE) ×2 IMPLANT
GOWN STRL REUS W/ TWL XL LVL3 (GOWN DISPOSABLE) ×2 IMPLANT
GUIDEWIRE STR DUAL SENSOR (WIRE) ×2 IMPLANT
GUIDEWIRE ZIPWRE .038 STRAIGHT (WIRE) IMPLANT
KIT TURNOVER KIT A (KITS) IMPLANT
LASER FIB FLEXIVA PULSE ID 365 (Laser) IMPLANT
MANIFOLD NEPTUNE II (INSTRUMENTS) ×2 IMPLANT
NS IRRIG 1000ML POUR BTL (IV SOLUTION) IMPLANT
PACK CYSTO (CUSTOM PROCEDURE TRAY) ×2 IMPLANT
SHEATH NAVIGATOR HD 11/13X28 (SHEATH) IMPLANT
SHEATH NAVIGATOR HD 11/13X36 (SHEATH) IMPLANT
STENT URET 6FRX26 CONTOUR (STENTS) IMPLANT
TRACTIP FLEXIVA PULS ID 200XHI (Laser) IMPLANT
TUBING CONNECTING 10 (TUBING) ×2 IMPLANT
TUBING UROLOGY SET (TUBING) ×2 IMPLANT

## 2024-02-18 NOTE — Progress Notes (Signed)
 PROGRESS NOTE  Charles LEOPARD  ZOX:[id number] DOB: 07/01/1930 DOA: 02/09/2024 [facility]: [facility]   Brief Narrative: Richard Charles 89 year old Male with history of dementia, AAA, permanent A-fib on Eliquis, DM 2, HTN, impaired hearing, CVA, HLD, CKD 3B admitted to the hospital from 1/25 - 01/09/2024 for obstructive renal stone requiring emergent cystoscopy and left-sided stent placement.  Eventually discharged on 10 days of oral antibiotics and Foley catheter.  Now admitted for urosepsis,  initially on cefepime now transitioned to IV Rocephin.  Urology planning on stent removal on 02/18/24.  Creatinine remains elevated which has been chronic.  Assessment & Plan:   Principal Problem:   Complicated urinary tract infection Active Problems:   Type 2 diabetes mellitus (HCC)   Hyponatremia   Hyperkalemia   Hyperlipidemia   Metabolic acidosis  Sepsis secondary to complicated UTI: CAUTI: He is admitted for urosepsis. Sepsis physiology resolved. Abdominal x-ray showed left ureteral stent in good placement.   Reviewed by urology, no urgent intervention at this point.   Foley catheter replaced by the nurses on 02/10/24. Cultures are negative.   - continue ceftriaxone. - Stent removal 3/10 by urology. IVF while NPO for procedure - continue home oxybutynin - void trial prior to dc   Permanent A-fib, intermittent RVR: HR controlled, Continue Coreg, Eliquis.   Chronic kidney disease stage IV- V: Metabolic acidosis: For the past several weeks,  patient's creatinine has been greater than 4, has had 7.3 back in January.   Follows at the [place]. creatinine now stable between 4.5-5.3.   Renal ultrasound showing mild bilateral hydronephrosis. otherwise unremarkable.  - monitor BMP   Hyperkalemia: Now Improved.   Hypomagnesemia: Continue as needed repletion.    Non-insulin-dependent type 2 diabetes Last A1c 6.5 on 01/05/2024.   Stopping insulin   Hypertension: Continue Coreg    Chronic anemia: Hemoglobin stable, monitor labs.   Dementia: Delirium precautions. History of CVA Hyperlipidemia     DVT prophylaxis: Eliquis Code Status: DNR Family Communication: none at bed side Disposition Plan:    Status is: Inpatient Remains inpatient appropriate because: Severity of illness.    Consultants:  Urology  Procedures: stent removal 3/10  Antimicrobials:  Anti-infectives (From admission, onward)    Start     Dose/Rate Route Frequency Ordered Stop   02/16/24 1700  fluconazole (DIFLUCAN) tablet 200 mg        200 mg Oral Every 24 hours 02/16/24 1226     02/15/24 0845  cefTRIAXone (ROCEPHIN) 2 g in sodium chloride 0.9 % 100 mL IVPB  Status:  Discontinued        2 g 200 mL/hr over 30 Minutes Intravenous 30 min pre-op 02/14/24 1524 02/14/24 1526   02/14/24 1745  fluconazole (DIFLUCAN) IVPB 200 mg  Status:  Discontinued        200 mg 100 mL/hr over 60 Minutes Intravenous Every 24 hours 02/14/24 1650 02/16/24 1226   02/12/24 1715  cefTRIAXone (ROCEPHIN) 1 g in sodium chloride 0.9 % 100 mL IVPB        1 g 200 mL/hr over 30 Minutes Intravenous Every 24 hours 02/12/24 1621     02/10/24 2200  ceFEPIme (MAXIPIME) 2 g in sodium chloride 0.9 % 100 mL IVPB  Status:  Discontinued        2 g 200 mL/hr over 30 Minutes Intravenous Every 24 hours 02/10/24 0541 02/12/24 1621   02/09/24 2300  ceFEPIme (MAXIPIME) 2 g in sodium chloride 0.9 % 100 mL IVPB  2 g 200 mL/hr over 30 Minutes Intravenous  Once 02/09/24 2254 02/10/24 0019      Subjective: Patient reports feeling well. He is awaiting his stent removal. Denies abdominal/flank pain  Objective: Vitals:   02/17/24 0927 02/17/24 1356 02/17/24 2053 02/18/24 0456  BP: 118/78 121/72 139/85 (!) 144/73  Pulse: (!) 45 74 95 95  Resp:  20 18 18   Temp: 99 F (37.2 C) 98.4 F (36.9 C) 98.9 F (37.2 C) 98.1 F (36.7 C)  TempSrc: Oral Oral Oral Oral  SpO2: 98% 100% 99% 98%  Weight:      Height:         Intake/Output Summary (Last 24 hours) at 02/18/2024 0723 Last data filed at 02/18/2024 0500 Gross per 24 hour  Intake 360 ml  Output 2050 ml  Net -1690 ml   Filed Weights   02/10/24 1200  Weight: 103.5 kg    Examination:  General exam: Appears calm and comfortable, not in any acute distress. Hard of hearing Respiratory system: CTA Bilaterally . Respiratory effort normal.  Cardiovascular system: S1 & S2 heard, RRR. No JVD, murmurs, rubs, gallops or clicks.  Gastrointestinal system: Abdomen is non distended, soft and non tender. Normal bowel sounds heard. Central nervous system: Alert and oriented X 3. No focal neurological deficits. Extremities: No edema, No cyanosis, No clubbing. Skin: No rashes, lesions or ulcers Psychiatry: Judgement and insight appear normal. Mood & affect appropriate.   Data Reviewed: I have personally reviewed following labs and imaging studies  CBC: Recent Labs  Lab 02/14/24 0355 02/15/24 0332 02/16/24 0417 02/16/24 0912 02/17/24 0418 02/18/24 0339  WBC 10.1 9.4 10.5  --  11.3* 11.8*  HGB 9.5* 9.4* 8.7* 8.8* 8.7* 8.8*  HCT 30.5* 30.1* 28.7* 29.0* 27.1* 28.7*  MCV 99.3 100.3* 102.9*  --  97.5 100.7*  PLT 343 353 327  --  323 352   Basic Metabolic Panel: Recent Labs  Lab 02/14/24 0355 02/15/24 0332 02/16/24 0417 02/17/24 0418 02/18/24 0339  NA 141 144 138 140 140  K 4.3 4.0 4.1 3.9 4.0  CL 116* 114* 111 111 110  CO2 13* 16* 17* 19* 20*  GLUCOSE 128* 136* 116* 121* 128*  BUN 76* 70* 61* 59* 55*  CREATININE 4.47* 3.79* 3.31* 2.98* 3.01*  CALCIUM 8.2* 7.9* 8.0* 8.3* 8.3*  MG 1.9 1.6* 1.7 1.7 1.5*  PHOS  --   --  4.3  --  4.4    Time spent: 35 mins    Leeroy Bock, MD [facility]   If 7PM-7AM, please contact night-coverage

## 2024-02-18 NOTE — Progress Notes (Signed)
 Patient transferred from PACU to room 1427. Family in room on arrival. RN stated to family that the patient and RN attempted to place hearing aid without success. When RN initially tried to place hearing aid, patient took it and said "I'll do it." Patient was not able to place hearing aid in his ear because the battery would not stay in place. Patient pulled the ear piece off of the amplifier and put it back in the container. When patient's daughter opened the container, she stated "ya'll broke his hearing aid." I informed her that the patient had taken the hearing aid apart.

## 2024-02-18 NOTE — Anesthesia Procedure Notes (Signed)
 Procedure Name: LMA Insertion Date/Time: 02/18/2024 4:59 PM  Performed by: Micki Riley, CRNAPre-anesthesia Checklist: Patient identified, Emergency Drugs available, Suction available and Patient being monitored Patient Re-evaluated:Patient Re-evaluated prior to induction Oxygen Delivery Method: Circle System Utilized Preoxygenation: Pre-oxygenation with 100% oxygen Induction Type: IV induction Ventilation: Mask ventilation without difficulty LMA: LMA inserted LMA Size: 5.0 Number of attempts: 1 Airway Equipment and Method: Bite block Placement Confirmation: positive ETCO2 Tube secured with: Tape Dental Injury: Teeth and Oropharynx as per pre-operative assessment

## 2024-02-18 NOTE — Op Note (Signed)
 Preoperative diagnosis: Left ureteral stone, left renal stones Postoperative diagnosis: Same  Procedure: Cystoscopy with left ureteroscopy, laser lithotripsy, left ureteral stent exchange  Surgeon: Mena Goes  Anesthesia: General  Indication for procedure: Charles Richard is a 89 year old male with a history of kidney stones and ureteral stones.  Family elected surveillance but he became septic.  And underwent an urgent left stent.  He presented today for definitive left-sided stone management.  Findings: On exam the penis was circumcised without mass or lesion.  Glans and meatus appeared normal.  Foreskin appeared normal.  On cystoscopy the urethra was unremarkable, prostate was short and nonobstructive, bladder was moderately trabeculated with some mild debris.  Urine overall clear.  The stent was very clean.  No calcification or encrustation.  He should not need a long-term Foley catheter from a bladder outlet obstruction point of view.  On ureteroscopy stone was located in the left mid ureter and the stones were located in the left lower pole.  All stones were dusted.  Description of procedure: After consent was obtained patient brought to the operating room.  We gave him a dose of Rocephin and Diflucan.  He was placed in lithotomy position after adequate anesthesia.  He was prepped and draped in the usual sterile fashion.  Timeout was performed to confirm the patient and procedure.  Cystoscope was passed per urethra and the bladder irrigated several times.  Left ureteral stent grasped and removed through the urethral meatus and a sensor wire was advanced.  Stent was removed.  A long dual channel semirigid ureteroscope was advanced.  I kept the water on a minimum flow and located the stone in the left mid ureter where a 200 m laser fiber was advanced and the stone was dusted.  No significant fragments remained.  Went up into the proximal ureter without difficulty and passed a Glidewire under direct vision  and backed the scope out.  The ureteral access sheath 11/13 was advanced without any resistance.  Dual channel digital ureteroscope was advanced up into the left kidney where visualization was good.  Access sheath draining well.  Lower pole stones were noted.  I was Charles Richard to dust most of the stones.  A couple of larger fragments were taken out of the lower pole and dropped in the upper pole with an engage basket for ease of fragmentation.  There was a larger stone in the lower pole that was grasped but it wedged in the infundibulum.  Therefore I dropped it here and it was in a better location to fragment.  It was dusted.  Now had better access to the most inferior lower pole calyx was Charles Richard to dust the stones.  Also in a more posterior lower pole calyx stones were dusted.  The stents have been visible on scout imaging and they faded.  No other stones were noted endoscopically none fluoroscopically.  I rechecked the other calyces in the upper pole and noted no other stones.  The access sheath was backed out on the ureteroscope and the collecting system, renal pelvis and ureter inspected on the way out noted to be free of any significant fragment and injury.  The wire was backloaded on the cystoscope and a 6 x 26 cm stent advanced.  The wire was removed with a good coil seen in the kidney and a good coil in the bladder.  A 16 French Foley catheter was placed to max drain the system for a day or 2.  The stent string was taped to the  Foley and he was awakened and taken to the recovery in stable condition.  Complications: None  Blood loss: Minimal  Specimens: None  Drains: 6 x 26 cm left ureteral stent taped to a 16 French Foley catheter  Disposition: Patient stable to PACU.  I went over the procedure, postop care and follow-up with the patient's daughter and son.

## 2024-02-18 NOTE — Progress Notes (Signed)
 S: No complaints.   O: Vitals:   02/18/24 0456 02/18/24 1326  BP: (!) 144/73 (!) 149/79  Pulse: 95 85  Resp: 18 20  Temp: 98.1 F (36.7 C) 97.9 F (36.6 C)  SpO2: 98% 99%   CBC    Component Value Date/Time   WBC 11.8 (H) 02/18/2024 0339   RBC 2.85 (L) 02/18/2024 0339   HGB 8.8 (L) 02/18/2024 0339   HCT 28.7 (L) 02/18/2024 0339   PLT 352 02/18/2024 0339   MCV 100.7 (H) 02/18/2024 0339   MCH 30.9 02/18/2024 0339   MCHC 30.7 02/18/2024 0339   RDW 12.3 02/18/2024 0339   LYMPHSABS 0.7 02/09/2024 2157   MONOABS 1.5 (H) 02/09/2024 2157   EOSABS 0.5 02/09/2024 2157   BASOSABS 0.1 02/09/2024 2157   Lab Results  Component Value Date   CREATININE 3.01 (H) 02/18/2024   CREATININE 2.98 (H) 02/17/2024   CREATININE 3.31 (H) 02/16/2024    Urine cx +yeast  A/P: Left mid ureteral stone and left renal stones - stent and foley -Foley catheter was changed.  Will not clear infection until stent exchange/removed. Left ureteral stone in mid ureter and left lower pole stones.  Again discussed ureteroscopy with patient and family-son and daughter.  Discussed risk of sepsis.  He has been on antifungals and antibacterials.  He has been afebrile and stable.  Creatinine is much improved.  He may be as stable as he is going to get to be able to undergo a procedure like this.  We discussed just dealing with the ureteral stone and leaving the renal stones but they would really like to try to get that system cleared if possible. Also discussed cysto and stent exchange, repeat attempt at URS in future. 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  He is due for Rocephin and antifungal fluconazole later today, so we will go ahead and give those doses now.  I discussed with the patient and children the nature, potential benefits, risks and alternatives to cystoscopy, left ureteroscopy laser lithotripsy, left ureteral stent exchange, including side  effects of the proposed treatment, the likelihood of the patient achieving the goals of the procedure, and any potential problems that might occur during the procedure or recuperation.  Discussed rationale for temporary new stent and Foley to max drain system and then hope to remove that very soon. All questions answered. Patient and family elect to proceed.

## 2024-02-18 NOTE — Progress Notes (Signed)
 Physical Therapy Treatment Patient Details Name: Charles Richard MRN: [id number] DOB: [birth year] Today's Date: 02/18/2024   History of Present Illness 89 year old presents to the ED 02/09/24 via EMS with complaints of fever, dysuria, and cough and admitted for urosepsis. PMHx: dementia, AAA, permanent A-fib on Eliquis, DM 2, HTN, impaired hearing, CVA, HLD, CKD 3B.  Pt with recent admission 1/25 - 01/09/2024 for obstructive renal stone requiring emergent cystoscopy and left-sided stent placement.  Eventually discharged on 10 days of oral antibiotics and Foley catheter.    PT Comments  Pt states that he is doing well overall, currently he is NPO for surgery today (CYSTOSCOPY/URETEROSCOPY/HOLMIUM LASER/STENT PLACEMENT - Left). Pt comes to sit EOB with CGA as he is able to complete partial transfer with HOB elevated and handrail use, requires HHA and trunk assist at [facility] to complete transfer. Pt reports he needs to have a BM urgently, BSC placed and BM completed. For STS he requires min A x2 and for amb CGA using 2WW. Pt resting comfortably in the chair with nursing staff present.    If plan is discharge home, recommend the following: A little help with walking and/or transfers;A little help with bathing/dressing/bathroom;Assistance with cooking/housework;Assist for transportation;Help with stairs or ramp for entrance   Can travel by private vehicle        Equipment Recommendations  None recommended by PT    Recommendations for Other Services       Precautions / Restrictions Precautions Precautions: Fall Recall of Precautions/Restrictions: Intact Precaution/Restrictions Comments: likes to wear his shoes (grip) Restrictions Weight Bearing Restrictions Per Provider Order: No     Mobility  Bed Mobility Overal bed mobility: Needs Assistance Bed Mobility: Supine to Sit     Supine to sit: Contact guard     General bed mobility comments: Pt able to come partway to sitting EOB with HOB  elevated. requires CGA for trunk assist.    Transfers Overall transfer level: Needs assistance Equipment used: Rolling walker (2 wheels) Transfers: Sit to/from Stand Sit to Stand: +2 physical assistance, Min assist   Step pivot transfers: Min assist       General transfer comment: transfers to [facility] at min A, minAx2 for STS from bed and BSC    Ambulation/Gait Ambulation/Gait assistance: Contact guard assist Gait Distance (Feet): 10 Feet Assistive device: Rolling walker (2 wheels) Gait Pattern/deviations: Step-through pattern, Steppage, Decreased dorsiflexion - left, Decreased dorsiflexion - right, Trunk flexed, Decreased stride length, Wide base of support Gait velocity: decr     General Gait Details: inc time, pauses prior to directional changes   Stairs             Wheelchair Mobility     Tilt Bed    Modified Rankin (Stroke Patients Only)       Balance Overall balance assessment: Needs assistance Sitting-balance support: Feet supported, Single extremity supported Sitting balance-Leahy Scale: Good Sitting balance - Comments: able to changes gowns sitting EOB with weight shifting and intermittent HHA   Standing balance support: Bilateral upper extremity supported, Reliant on assistive device for balance, During functional activity Standing balance-Leahy Scale: Poor                              Communication    Cognition Arousal: Alert Behavior During Therapy: WFL for tasks assessed/performed   PT - Cognitive impairments: No apparent impairments  PT - Cognition Comments: AxO x 3 pleasant and willing.  Lives home with Aide M-F 9 -5 while daughter is at work.  Then rotating family support nights/weekends.  VERY HOH even with one R hearing aide        Cueing    Exercises      General Comments General comments (skin integrity, edema, etc.): has BM during session      Pertinent Vitals/Pain Pain  Assessment Pain Assessment: No/denies pain Pain Score: 0-No pain    Home Living                          Prior Function            PT Goals (current goals can now be found in the care plan section) Acute Rehab PT Goals Patient Stated Goal: plans to return home PT Goal Formulation: With patient/family Time For Goal Achievement: 02/25/24 Potential to Achieve Goals: Good Progress towards PT goals: Progressing toward goals    Frequency    Min 2X/week      PT Plan      Co-evaluation              AM-PAC PT "6 Clicks" Mobility   Outcome Measure  Help needed turning from your back to your side while in a flat bed without using bedrails?: A Little Help needed moving from lying on your back to sitting on the side of a flat bed without using bedrails?: A Lot Help needed moving to and from a bed to a chair (including a wheelchair)?: A Lot Help needed standing up from a chair using your arms (e.g., wheelchair or bedside chair)?: A Lot Help needed to walk in hospital room?: A Little Help needed climbing 3-5 steps with a railing? : A Lot 6 Click Score: 14    End of Session Equipment Utilized During Treatment: Gait belt Activity Tolerance: Patient limited by fatigue Patient left: in chair;with call bell/phone within reach;with nursing/sitter in room Nurse Communication: Mobility status PT Visit Diagnosis: Muscle weakness (generalized) (M62.81);Difficulty in walking, not elsewhere classified (R26.2)     Time: 1000-1015 PT Time Calculation (min) (ACUTE ONLY): 15 min  Charges:    $Therapeutic Activity: 8-22 mins PT General Charges $$ ACUTE PT VISIT: 1 Visit                     Madaline Guthrie, PT Acute Rehabilitation Services Office: [phone] 02/18/2024\   Evelena Peat 02/18/2024, 10:42 AM

## 2024-02-18 NOTE — Anesthesia Preprocedure Evaluation (Addendum)
 Anesthesia Evaluation  Patient identified by MRN, date of birth, ID band Patient confused    Reviewed: Allergy & Precautions, H&P , NPO status , Patient's Chart, lab work & pertinent test results, reviewed documented beta blocker date and time   Airway Mallampati: II  TM Distance: >3 FB Neck ROM: Full    Dental no notable dental hx. (+) Edentulous Upper, Partial Lower, Poor Dentition, Dental Advisory Given   Pulmonary neg pulmonary ROS   Pulmonary exam normal breath sounds clear to auscultation       Cardiovascular hypertension, Pt. on medications and Pt. on home beta blockers + CAD  + dysrhythmias Atrial Fibrillation  Rhythm:Irregular Rate:Normal     Neuro/Psych       Dementia CVA, No Residual Symptoms    GI/Hepatic negative GI ROS, Neg liver ROS,,,  Endo/Other  diabetes, Type 2, Oral Hypoglycemic Agents    Renal/GU Renal disease  negative genitourinary   Musculoskeletal  (+) Arthritis , Osteoarthritis,    Abdominal   Peds  Hematology negative hematology ROS (+)   Anesthesia Other Findings   Reproductive/Obstetrics negative OB ROS                             Anesthesia Physical Anesthesia Plan  ASA: 3  Anesthesia Plan: General   Post-op Pain Management: Ofirmev IV (intra-op)*   Induction: Intravenous  PONV Risk Score and Plan: 3 and Ondansetron, Dexamethasone and Treatment may vary due to age or medical condition  Airway Management Planned: LMA  Additional Equipment:   Intra-op Plan:   Post-operative Plan: Extubation in OR  Informed Consent: I have reviewed the patients History and Physical, chart, labs and discussed the procedure including the risks, benefits and alternatives for the proposed anesthesia with the patient or authorized representative who has indicated his/her understanding and acceptance.   Patient has DNR.  Discussed DNR with power of attorney and Suspend  DNR.   Dental advisory given  Plan Discussed with: CRNA  Anesthesia Plan Comments:        Anesthesia Quick Evaluation

## 2024-02-18 NOTE — Transfer of Care (Signed)
 Immediate Anesthesia Transfer of Care Note  Patient: Charles Richard  Procedure(s) Performed: CYSTOSCOPY/URETEROSCOPY/HOLMIUM LASER/STENT PLACEMENT (Left: Ureter) CYSTOSCOPY, WITH RETROGRADE PYELOGRAM (Left: Bladder)  Patient Location: PACU  Anesthesia Type:General  Level of Consciousness: awake and alert   Airway & Oxygen Therapy: Patient Spontanous Breathing and Patient connected to nasal cannula oxygen  Post-op Assessment: Report given to RN  Post vital signs: stable  Last Vitals:  Vitals Value Taken Time  BP 141/92 02/18/24 1811  Temp    Pulse 97 02/18/24 1814  Resp 19 02/18/24 1814  SpO2 100 % 02/18/24 1814  Vitals shown include unfiled device data.  Last Pain:  Vitals:   02/18/24 1435  TempSrc:   PainSc: 0-No pain      Patients Stated Pain Goal: 0 (02/14/24 0755)  Complications: No notable events documented.

## 2024-02-18 NOTE — Anesthesia Postprocedure Evaluation (Signed)
 Anesthesia Post Note  Patient: Charles Richard  Procedure(s) Performed: CYSTOSCOPY/URETEROSCOPY/HOLMIUM LASER/STENT PLACEMENT (Left: Ureter) CYSTOSCOPY, WITH RETROGRADE PYELOGRAM (Left: Bladder)     Patient location during evaluation: PACU Anesthesia Type: General Level of consciousness: awake and alert Pain management: pain level controlled Vital Signs Assessment: post-procedure vital signs reviewed and stable Respiratory status: spontaneous breathing, nonlabored ventilation and respiratory function stable Cardiovascular status: blood pressure returned to baseline and stable Postop Assessment: no apparent nausea or vomiting Anesthetic complications: no  No notable events documented.  Last Vitals:  Vitals:   02/18/24 1830 02/18/24 1845  BP: (!) 153/92 (!) 170/97  Pulse:    Resp:    Temp:    SpO2:  98%    Last Pain:  Vitals:   02/18/24 1435  TempSrc:   PainSc: 0-No pain                 Mack Alvidrez,W. EDMOND

## 2024-02-18 NOTE — Plan of Care (Signed)
   Problem: Safety: Goal: Ability to remain free from injury will improve Outcome: Progressing   Problem: Skin Integrity: Goal: Risk for impaired skin integrity will decrease Outcome: Progressing

## 2024-02-19 ENCOUNTER — Encounter (HOSPITAL_COMMUNITY): Payer: Self-pay | Admitting: Urology

## 2024-02-19 DIAGNOSIS — N390 Urinary tract infection, site not specified: Secondary | ICD-10-CM | POA: Diagnosis not present

## 2024-02-19 LAB — BASIC METABOLIC PANEL
Anion gap: 12 (ref 5–15)
BUN: 49 mg/dL — ABNORMAL HIGH (ref 8–23)
CO2: 17 mmol/L — ABNORMAL LOW (ref 22–32)
Calcium: 8.2 mg/dL — ABNORMAL LOW (ref 8.9–10.3)
Chloride: 109 mmol/L (ref 98–111)
Creatinine, Ser: 2.82 mg/dL — ABNORMAL HIGH (ref 0.61–1.24)
GFR, Estimated: 20 mL/min — ABNORMAL LOW (ref >60)
Glucose, Bld: 152 mg/dL — ABNORMAL HIGH (ref 70–99)
Potassium: 4 mmol/L (ref 3.5–5.1)
Sodium: 138 mmol/L (ref 135–145)

## 2024-02-19 NOTE — Plan of Care (Signed)
  Problem: Coping: Goal: Ability to adjust to condition or change in health will improve Outcome: Progressing   Problem: Skin Integrity: Goal: Risk for impaired skin integrity will decrease Outcome: Progressing   

## 2024-02-19 NOTE — Progress Notes (Signed)
 PROGRESS NOTE  MONTRICE GRACEY  NWG:[id number] DOB: [birth year] DOA: 02/09/2024 PCP: [facility]   Brief Narrative: MONTRAIL MEHRER 89 year old Male with history of dementia, AAA, permanent A-fib on Eliquis, DM 2, HTN, impaired hearing, CVA, HLD, CKD 3B admitted to the hospital from 1/25 - 01/09/2024 for obstructive renal stone requiring emergent cystoscopy and left-sided stent placement.  Eventually discharged on 10 days of oral antibiotics and Foley catheter.  Now admitted for urosepsis,  initially on cefepime now transitioned to IV Rocephin.  Urology planning on stent removal on 02/18/24. New stent and foley placed at that time. Creatinine is improved today.   Assessment & Plan:   Principal Problem:   Complicated urinary tract infection Active Problems:   Type 2 diabetes mellitus (HCC)   Hyponatremia   Hyperkalemia   Hyperlipidemia   Metabolic acidosis  Sepsis secondary to complicated UTI  CAUTI: Sepsis physiology resolved. Abdominal x-ray showed left ureteral stent in good placement.   Followed by urology Foley catheter replaced on 02/10/24, and 02/18/24. Cultures are negative.   - continue ceftriaxone. - Stent removal 3/10 by urology.  - continue home oxybutynin - void trial prior to dc   Permanent A-fib, intermittent RVR: HR controlled, Continue Coreg, Eliquis.   AKI on Chronic kidney disease stage IV- V: improving Metabolic acidosis: Cr 5.15>>>2.82 For the past several weeks,  patient's creatinine has been greater than 4, has had 7.3 back in January.   Follows at the [place]. Renal ultrasound showing mild bilateral hydronephrosis. otherwise unremarkable.  - monitor BMP   Hyperkalemia: Now Improved.   Hypomagnesemia: Continue as needed repletion.    Non-insulin-dependent type 2 diabetes Last A1c 6.5 on 01/05/2024.   Stoppedinsulin   Hypertension: Continue Coreg   Chronic anemia: Hemoglobin stable, monitor labs.   Dementia: Delirium precautions. History  of CVA Hyperlipidemia   DVT prophylaxis: Eliquis Code Status: DNR Family Communication: daughter at bed side Disposition Plan:    Status is: Inpatient Remains inpatient appropriate because: Severity of illness.    Consultants:  Urology  Procedures: stent removal 3/10  Subjective: Patient reports feeling well. He states he has no pain today. Looking forward to going home.   Objective: Vitals:   02/18/24 1856 02/18/24 1949 02/19/24 0006 02/19/24 0433  BP: (!) 146/89 (!) 158/85 121/82 133/74  Pulse: 92 89 (!) 105 (!) 123  Resp: 18 16 16 16   Temp: 98.4 F (36.9 C) 97.9 F (36.6 C) 98 F (36.7 C) 98.2 F (36.8 C)  TempSrc: Oral     SpO2: 99% 100% 96% 96%  Weight:      Height:        Intake/Output Summary (Last 24 hours) at 02/19/2024 5784 Last data filed at 02/19/2024 0406 Gross per 24 hour  Intake 1719.68 ml  Output 1950 ml  Net -230.32 ml   Filed Weights   02/10/24 1200  Weight: 103.5 kg    Examination:  General exam: Appears calm and comfortable, not in any acute distress. Hard of hearing Respiratory system: CTA Bilaterally . Respiratory effort normal.  Cardiovascular system: S1 & S2 heard, RRR. No JVD, murmurs, rubs, gallops or clicks.  Gastrointestinal system: Abdomen is non distended, soft and non tender. Normal bowel sounds heard. Central nervous system: Alert and oriented X 3. No focal neurological deficits. Extremities: No edema, No cyanosis, No clubbing. Skin: No rashes, lesions or ulcers Psychiatry: Judgement and insight appear normal. Mood & affect appropriate.   Data Reviewed: I have personally reviewed following labs and imaging  studies  CBC: Recent Labs  Lab 02/14/24 0355 02/15/24 0332 02/16/24 0417 02/16/24 0912 02/17/24 0418 02/18/24 0339  WBC 10.1 9.4 10.5  --  11.3* 11.8*  HGB 9.5* 9.4* 8.7* 8.8* 8.7* 8.8*  HCT 30.5* 30.1* 28.7* 29.0* 27.1* 28.7*  MCV 99.3 100.3* 102.9*  --  97.5 100.7*  PLT 343 353 327  --  323 352   Basic  Metabolic Panel: Recent Labs  Lab 02/14/24 0355 02/15/24 0332 02/16/24 0417 02/17/24 0418 02/18/24 0339 02/19/24 0354  NA 141 144 138 140 140 138  K 4.3 4.0 4.1 3.9 4.0 4.0  CL 116* 114* 111 111 110 109  CO2 13* 16* 17* 19* 20* 17*  GLUCOSE 128* 136* 116* 121* 128* 152*  BUN 76* 70* 61* 59* 55* 49*  CREATININE 4.47* 3.79* 3.31* 2.98* 3.01* 2.82*  CALCIUM 8.2* 7.9* 8.0* 8.3* 8.3* 8.2*  MG 1.9 1.6* 1.7 1.7 1.5*  --   PHOS  --   --  4.3  --  4.4  --     Time spent: 35 mins    Leeroy Bock, MD [facility]   If 7PM-7AM, please contact night-coverage

## 2024-02-19 NOTE — Progress Notes (Signed)
 S: Pt without complaints.   O: Vitals:   02/19/24 0904 02/19/24 1337  BP: 125/74 135/88  Pulse: 99 87  Resp:  17  Temp:    SpO2:  100%   Smiling and talking in bed Foley in place - sediment from stone dusting in tubing but urine clear  CBC    Component Value Date/Time   WBC 11.8 (H) 02/18/2024 0339   RBC 2.85 (L) 02/18/2024 0339   HGB 8.8 (L) 02/18/2024 0339   HCT 28.7 (L) 02/18/2024 0339   PLT 352 02/18/2024 0339   MCV 100.7 (H) 02/18/2024 0339   MCH 30.9 02/18/2024 0339   MCHC 30.7 02/18/2024 0339   RDW 12.3 02/18/2024 0339   LYMPHSABS 0.7 02/09/2024 2157   MONOABS 1.5 (H) 02/09/2024 2157   EOSABS 0.5 02/09/2024 2157   BASOSABS 0.1 02/09/2024 2157   Lab Results  Component Value Date   CREATININE 2.82 (H) 02/19/2024   CREATININE 3.01 (H) 02/18/2024   CREATININE 2.98 (H) 02/17/2024    A/p: -left ureteral and renal stones - s/p ureteroscopy and laser 3/11. Plan stent removal in AM (taped to foley)  -UTI - bacterial and fungal - continue abx and antifungal at least through Friday or longer per guidelines   -urinary retention - d/c foley in AM. Prostate is short and non-obstructive.

## 2024-02-20 DIAGNOSIS — N390 Urinary tract infection, site not specified: Secondary | ICD-10-CM | POA: Diagnosis not present

## 2024-02-20 DIAGNOSIS — N132 Hydronephrosis with renal and ureteral calculous obstruction: Secondary | ICD-10-CM

## 2024-02-20 DIAGNOSIS — E875 Hyperkalemia: Secondary | ICD-10-CM

## 2024-02-20 DIAGNOSIS — A419 Sepsis, unspecified organism: Secondary | ICD-10-CM

## 2024-02-20 DIAGNOSIS — R6520 Severe sepsis without septic shock: Principal | ICD-10-CM

## 2024-02-20 LAB — BASIC METABOLIC PANEL
Anion gap: 11 (ref 5–15)
BUN: 45 mg/dL — ABNORMAL HIGH (ref 8–23)
CO2: 20 mmol/L — ABNORMAL LOW (ref 22–32)
Calcium: 8.1 mg/dL — ABNORMAL LOW (ref 8.9–10.3)
Chloride: 106 mmol/L (ref 98–111)
Creatinine, Ser: 2.76 mg/dL — ABNORMAL HIGH (ref 0.61–1.24)
GFR, Estimated: 21 mL/min — ABNORMAL LOW (ref >60)
Glucose, Bld: 108 mg/dL — ABNORMAL HIGH (ref 70–99)
Potassium: 3.8 mmol/L (ref 3.5–5.1)
Sodium: 137 mmol/L (ref 135–145)

## 2024-02-20 MED ORDER — CEPHALEXIN 500 MG PO CAPS: 500.0000 mg | ORAL_CAPSULE | Freq: Two times a day (BID) | ORAL | 0 refills | Status: AC

## 2024-02-20 MED ORDER — FLUCONAZOLE 200 MG PO TABS: 200.0000 mg | ORAL_TABLET | ORAL | 0 refills | Status: AC

## 2024-02-20 NOTE — TOC Transition Note (Signed)
 Transition of Care [facility]) - Discharge Note  Patient Details  Name: Charles Richard MRN: [id number] Date of Birth: 1930-04-02  [facility] CM/SW Contact:  Ewing Schlein, LCSW Phone Number: 02/20/2024, 11:37 AM  Clinical Narrative: Patient will discharge home today. CSW notified Marylene Land with [facility] regarding discharge. TOC signing off.  Final next level of care: Home w Home Health Services Barriers to Discharge: Barriers Resolved  Patient Goals and CMS Choice Patient states their goals for this hospitalization and ongoing recovery are:: Resume [facility] CMS Medicare.gov Compare Post Acute Care list provided to:: Patient Represenative (must comment) Choice offered to / list presented to : Adult Children  Discharge Plan and Services Additional resources added to the After Visit Summary for   In-house Referral: Clinical Social Work Post Acute Care Choice: Home Health          DME Arranged: N/A DME Agency: NA HH Arranged: PT, OT HH Agency: Other - See comment Cindie Laroche) Date HH Agency Contacted: 02/20/24 Time HH Agency Contacted: 1136 Representative spoke with at [facility] Agency: Marylene Land  Social Drivers of Health (SDOH) Interventions SDOH Screenings   Food Insecurity: No Food Insecurity (02/10/2024)  Housing: Patient Declined (02/10/2024)  Transportation Needs: No Transportation Needs (02/10/2024)  Utilities: Not At Risk (02/10/2024)  Social Connections: Patient Unable To Answer (02/10/2024)  Tobacco Use: Low Risk  (02/18/2024)   Readmission Risk Interventions    02/10/2024   12:16 PM 01/07/2024   10:49 AM  Readmission Risk Prevention Plan  Transportation Screening Complete Complete  PCP or Specialist Appt within 3-5 Days Complete   HRI or Home Care Consult Complete Complete  Social Work Consult for Recovery Care Planning/Counseling Complete Complete  Palliative Care Screening Not Applicable Not Applicable  Medication Review Oceanographer) Complete Complete

## 2024-02-20 NOTE — Progress Notes (Signed)
 Occupational Therapy Treatment Patient Details Name: Charles Richard MRN: [id number] DOB: 1930-11-28 Today's Date: 02/20/2024   History of present illness 89 year old presents to the ED 02/09/24 via EMS with complaints of fever, dysuria, and cough and admitted for urosepsis. PMHx: dementia, AAA, permanent A-fib on Eliquis, DM 2, HTN, impaired hearing, CVA, HLD, CKD 3B.  Pt with recent admission 1/25 - 01/09/2024 for obstructive renal stone requiring emergent cystoscopy and left-sided stent placement.  Eventually discharged on 10 days of oral antibiotics and Foley catheter.   OT comments  Pt making progress with functional goals. Pt eager to d/c home later today. Pt very pleasant and cooperative and will have assist from family and aide at home with ADLs/selfcare      If plan is discharge home, recommend the following:  A lot of help with bathing/dressing/bathroom;A little help with walking and/or transfers;Assist for transportation;Direct supervision/assist for medications management;Help with stairs or ramp for entrance   Equipment Recommendations  None recommended by OT    Recommendations for Other Services      Precautions / Restrictions Precautions Precautions: Fall Recall of Precautions/Restrictions: Intact Precaution/Restrictions Comments: likes to wear his shoes (grip) Restrictions Weight Bearing Restrictions Per Provider Order: No       Mobility Bed Mobility Overal bed mobility: Needs Assistance Bed Mobility: Supine to Sit, Sit to Supine     Supine to sit: Contact guard Sit to supine: Min assist   General bed mobility comments: CGA to elevate trunk, min A with LEs    Transfers Overall transfer level: Needs assistance Equipment used: Rolling walker (2 wheels) Transfers: Sit to/from Stand Sit to Stand: Mod assist, Min assist     Step pivot transfers: Min assist     General transfer comment: mod A sit - stand from EOB, min A SPT to BSC, min A sit - stand from  [facility], chair     Balance Overall balance assessment: Needs assistance Sitting-balance support: Feet supported, Single extremity supported Sitting balance-Leahy Scale: Good     Standing balance support: Bilateral upper extremity supported, Reliant on assistive device for balance, During functional activity Standing balance-Leahy Scale: Poor                             ADL either performed or assessed with clinical judgement   ADL Overall ADL's : Needs assistance/impaired     Grooming: Wash/dry hands;Wash/dry face;Supervision/safety;Set up;Sitting           Upper Body Dressing : Supervision/safety;Set up;Sitting       Toilet Transfer: Moderate assistance;Minimal assistance;Cueing for safety;Rolling walker (2 wheels);Stand-pivot;BSC/3in1   Toileting- Architect and Hygiene: Moderate assistance;Sit to/from stand       Functional mobility during ADLs: Moderate assistance;Minimal assistance;Rolling walker (2 wheels);Cueing for safety      Extremity/Trunk Assessment Upper Extremity Assessment Upper Extremity Assessment: Generalized weakness   Lower Extremity Assessment Lower Extremity Assessment: Defer to PT evaluation        Vision Ability to See in Adequate Light: 0 Adequate Patient Visual Report: No change from baseline     Perception     Praxis     Communication Communication Communication: Impaired Factors Affecting Communication: Hearing impaired   Cognition Arousal: Alert Behavior During Therapy: WFL for tasks assessed/performed Cognition: No family/caregiver present to determine baseline Difficult to assess due to: Hard of hearing/deaf           OT - Cognition Comments: pt very pleasant, cooperative  Following commands: Intact        Cueing   Cueing Techniques: Verbal cues  Exercises      Shoulder Instructions       General Comments      Pertinent Vitals/ Pain       Pain Assessment Pain  Assessment: No/denies pain Pain Score: 0-No pain Pain Intervention(s): Monitored during session, Repositioned  Home Living                                          Prior Functioning/Environment              Frequency  Min 1X/week        Progress Toward Goals  OT Goals(current goals can now be found in the care plan section)  Progress towards OT goals: Progressing toward goals     Plan      Co-evaluation                 AM-PAC OT "6 Clicks" Daily Activity     Outcome Measure   Help from another person eating meals?: None Help from another person taking care of personal grooming?: A Little Help from another person toileting, which includes using toliet, bedpan, or urinal?: A Lot Help from another person bathing (including washing, rinsing, drying)?: A Lot Help from another person to put on and taking off regular upper body clothing?: A Little Help from another person to put on and taking off regular lower body clothing?: A Lot 6 Click Score: 16    End of Session Equipment Utilized During Treatment: Gait belt;Rolling walker (2 wheels);Other (comment) (BSC)  OT Visit Diagnosis: Unsteadiness on feet (R26.81);Other abnormalities of gait and mobility (R26.89);Muscle weakness (generalized) (M62.81)   Activity Tolerance Patient tolerated treatment well   Patient Left in bed;with call bell/phone within reach;with bed alarm set   Nurse Communication          Time: 1610-9604 OT Time Calculation (min): 17 min  Charges: OT General Charges $OT Visit: 1 Visit OT Treatments $Therapeutic Activity: 8-22 mins    Galen Manila 02/20/2024, 2:56 PM

## 2024-02-20 NOTE — Progress Notes (Addendum)
 S: Pt looks well. Sitting in chair. Seen on rounds this AM.   O: Vitals:   02/19/24 1943 02/20/24 0430  BP: (!) 137/93 138/76  Pulse: 100 88  Resp: 15 18  Temp: 98.3 F (36.8 C) 98.7 F (37.1 C)  SpO2: 98% 93%    Intake/Output Summary (Last 24 hours) at 02/20/2024 1152 Last data filed at 02/20/2024 0433 Gross per 24 hour  Intake 0 ml  Output 1575 ml  Net -1575 ml   Looks well. Talkative and friendly Urine clear  Lab Results  Component Value Date   CREATININE 2.76 (H) 02/20/2024   CREATININE 2.82 (H) 02/19/2024   CREATININE 3.01 (H) 02/18/2024   A/P:  -left ureteral and renal stones - s/p ureteroscopy and laser 3/11. Plan stent removal today (taped to foley)   -UTI - bacterial and fungal - continue abx and antifungal at least through Friday or longer per guidelines    -urinary retention - d/c foley today. Prostate is short and non-obstructive. May have functional or neurogenic incontinence. Only replace foley for painful inability to void. Monitor for urinal use or wet diaper. Discussed with nurse.   Follow-up on chart.

## 2024-02-20 NOTE — Discharge Summary (Signed)
 Physician Discharge Summary  Patient: Charles Richard ZOX:[id number] DOB: 04/14/1930   Code Status: Do not attempt resuscitation (DNR) PRE-ARREST INTERVENTIONS DESIRED Admit date: 02/09/2024 Discharge date: 02/20/2024 Disposition: Home, PT, OT, nurse aid, and RN PCP: [facility]  Recommendations for Outpatient Follow-up:  Follow up with PCP within 1-2 weeks Regarding general hospital follow up and preventative care Follow up with urology   Discharge Diagnoses:  Principal Problem:   Complicated urinary tract infection Active Problems:   Type 2 diabetes mellitus (HCC)   Hyponatremia   Hyperkalemia   Hyperlipidemia   Metabolic acidosis   Acute hyperkalemia   Severe sepsis [facility])  Brief Hospital Course Summary: HUCK ASHWORTH is a 89 year old Male with history of dementia, AAA, permanent A-fib on Eliquis, DM 2, HTN, impaired hearing, CVA, HLD, CKD 3B admitted to the hospital from 1/25 - 01/09/2024 for obstructive renal stone requiring emergent cystoscopy and left-sided stent placement.  Eventually discharged on 10 days of oral antibiotics and Foley catheter.  3/2-Returned with urosepsis,  initially on cefepime, then transitioned to IV Rocephin and antifungal for UTI.  Urology performed stent removal on 02/18/24 with temporary New stent and foley placed at that time. Removed on 3/12. Patient was able to void after removal of the foley. He had no pain and felt ready to dc.   PT evaluated and recommended HH.   All other chronic conditions were treated with home medications.    Discharge Condition: Good, improved Recommended discharge diet: Regular healthy diet  Consultations: Urology   Procedures/Studies: Left ureteral stent placement, uretoscopy, foley placement  Discharge Instructions     Discharge patient   Complete by: As directed    Discharge disposition: 06-Home-Health Care Svc   Discharge patient date: 02/20/2024      Allergies as of 02/20/2024   No Known  Allergies      Medication List     STOP taking these medications    lisinopril 20 MG tablet Commonly known as: ZESTRIL   meclizine 25 MG tablet Commonly known as: ANTIVERT   STOOL SOFTENER PO   Zituvio 25 MG Tabs Generic drug: SITagliptin   zolpidem 10 MG tablet Commonly known as: AMBIEN       TAKE these medications    apixaban 5 MG Tabs tablet Commonly known as: ELIQUIS Take 2.5 mg by mouth 2 (two) times daily.   carvedilol 6.25 MG tablet Commonly known as: COREG Take 1 tablet (6.25 mg total) by mouth 2 (two) times daily with a meal. What changed: when to take this   cephALEXin 500 MG capsule Commonly known as: KEFLEX Take 1 capsule (500 mg total) by mouth 2 (two) times daily for 2 days.   fluconazole 200 MG tablet Commonly known as: DIFLUCAN Take 1 tablet (200 mg total) by mouth daily for 2 days.   gabapentin 300 MG capsule Commonly known as: NEURONTIN Take 1 capsule (300 mg total) by mouth at bedtime.   Glucerna Liqd Take 237 mLs by mouth daily.   latanoprost 0.005 % ophthalmic solution Commonly known as: XALATAN Place 1 drop into both eyes at bedtime.   loratadine 10 MG tablet Commonly known as: CLARITIN Take 10 mg by mouth daily with breakfast.   multivitamin with minerals Tabs tablet Take 1 tablet by mouth daily.   mupirocin ointment 2 % Commonly known as: BACTROBAN Apply 1 Application topically 2 (two) times daily.   nystatin-triamcinolone cream Commonly known as: MYCOLOG II Apply 1 Application topically as needed (sores).   ondansetron  8 MG tablet Commonly known as: ZOFRAN Take 8 mg by mouth daily as needed for nausea or vomiting.   Propylene Glycol 0.6 % Soln Place 2 drops into both eyes daily as needed (dry eyes).   simvastatin 20 MG tablet Commonly known as: ZOCOR Take 10 mg by mouth daily.   traMADol 50 MG tablet Commonly known as: ULTRAM Take 50 mg by mouth at bedtime as needed for moderate pain (pain score 4-6).    VITAMIN D PO Take 1 capsule by mouth daily.        Follow-up Information     [facility] Follow up.   Why: [facility] will provide PT and OT in the home after discharge.        Jerilee Field, MD. Call.   Specialty: Urology Contact information: [address] [place] [address] [phone]                 Subjective   Pt reports feeling well. Denies pain with urination. Was able to urinate after foley removed without difficulty. Tolerating normal diet.  All questions and concerns were addressed at time of discharge.  Objective  Blood pressure 138/76, pulse 88, temperature 98.7 F (37.1 C), resp. rate 18, height 6' (1.829 m), weight 103.5 kg, SpO2 93%.   General: Pt is alert, awake, not in acute distress Cardiovascular: RRR, S1/S2 +, no rubs, no gallops Respiratory: CTA bilaterally, no wheezing, no rhonchi Abdominal: Soft, NT, ND, bowel sounds + Extremities: no edema, no cyanosis  The results of significant diagnostics from this hospitalization (including imaging, microbiology, ancillary and laboratory) are listed below for reference.   Imaging studies: DG C-Arm 1-60 Min-No Report Result Date: 02/18/2024 Fluoroscopy was utilized by the requesting physician.  No radiographic interpretation.   DG C-Arm 1-60 Min-No Report Result Date: 02/18/2024 Fluoroscopy was utilized by the requesting physician.  No radiographic interpretation.   US RENAL Result Date: 02/12/2024 CLINICAL DATA:  Acute kidney injury EXAM: RENAL / URINARY TRACT ULTRASOUND COMPLETE COMPARISON:  CT 01/05/2024 FINDINGS: Right Kidney: Renal measurements: 11.9 x 6 x 6.8 cm = volume: 252 mL. Echogenicity within normal limits. Mild right hydronephrosis. Cysts measuring up to 4.4 cm at the upper pole for which no imaging follow-up is recommended. Left Kidney: Renal measurements: 11.8 x 5.7 x 6 cm = volume: 213.3 mL. Poorly visible. Echogenicity probably within normal limits. Mild left  hydronephrosis. No discrete mass by sonography. Bladder: Decompressed by catheter Other: None. IMPRESSION: 1. There is mild bilateral hydronephrosis 2. Right renal cysts for which no imaging follow-up is recommended Electronically Signed   By: Jasmine Pang M.D.   On: 02/12/2024 19:39   DG Abdomen 1 View Result Date: 02/09/2024 CLINICAL DATA:  Evaluation for stent placement. EXAM: ABDOMEN - 1 VIEW COMPARISON:  CT 01/05/2024 FINDINGS: No radiographic evidence of bowel obstruction. Peripherally calcified round lesion in the central mesentery is unchanged from prior CT. Left ureteral stent projects over the expected course of the left ureter. No radiopaque urinary calculi. IMPRESSION: 1. Left ureteral stent projects over the expected course of the left ureter. No radiopaque urinary calculi. Electronically Signed   By: Minerva Fester M.D.   On: 02/09/2024 23:47   DG Chest 2 View Result Date: 02/09/2024 CLINICAL DATA:  Possible sepsis EXAM: CHEST - 2 VIEW COMPARISON:  01/05/2024 FINDINGS: Cardiac shadow is within normal limits. Aortic calcifications are noted. The lungs are well aerated bilaterally. No bony abnormality is noted. Surgical changes in the right shoulder are seen.  IMPRESSION: No active cardiopulmonary disease. Electronically Signed   By: Alcide Clever M.D.   On: 02/09/2024 22:59    Labs: Basic Metabolic Panel: Recent Labs  Lab 02/14/24 0355 02/15/24 0332 02/16/24 0417 02/17/24 0418 02/18/24 0339 02/19/24 0354 02/20/24 0419  NA 141 144 138 140 140 138 137  K 4.3 4.0 4.1 3.9 4.0 4.0 3.8  CL 116* 114* 111 111 110 109 106  CO2 13* 16* 17* 19* 20* 17* 20*  GLUCOSE 128* 136* 116* 121* 128* 152* 108*  BUN 76* 70* 61* 59* 55* 49* 45*  CREATININE 4.47* 3.79* 3.31* 2.98* 3.01* 2.82* 2.76*  CALCIUM 8.2* 7.9* 8.0* 8.3* 8.3* 8.2* 8.1*  MG 1.9 1.6* 1.7 1.7 1.5*  --   --   PHOS  --   --  4.3  --  4.4  --   --    CBC: Recent Labs  Lab 02/14/24 0355 02/15/24 0332 02/16/24 0417 02/16/24 0912  02/17/24 0418 02/18/24 0339  WBC 10.1 9.4 10.5  --  11.3* 11.8*  HGB 9.5* 9.4* 8.7* 8.8* 8.7* 8.8*  HCT 30.5* 30.1* 28.7* 29.0* 27.1* 28.7*  MCV 99.3 100.3* 102.9*  --  97.5 100.7*  PLT 343 353 327  --  323 352   Microbiology: Results for orders placed or performed during the hospital encounter of 02/09/24  Urine Culture     Status: None   Collection Time: 02/09/24 12:00 AM   Specimen: Urine, Random  Result Value Ref Range Status   Specimen Description   Final    URINE, RANDOM Performed at [facility], [address] [place], [place] [address]    Special Requests   Final    NONE Reflexed from [id number] Performed at [facility], [address] [place], [place] [address]    Culture   Final    NO GROWTH Performed at [facility], [address], [place], [place] [address]    Report Status 02/11/2024 FINAL  Final  Culture, blood (Routine x 2)     Status: None   Collection Time: 02/09/24  9:54 PM   Specimen: BLOOD RIGHT FOREARM  Result Value Ref Range Status   Specimen Description   Final    BLOOD RIGHT FOREARM Performed at [facility], [address] [place], [place] [address]    Special Requests   Final    BOTTLES DRAWN AEROBIC AND ANAEROBIC Blood Culture results may not be optimal due to an inadequate volume of blood received in culture bottles Performed at [facility], [address] [place], [place] [address]    Culture   Final    NO GROWTH 5 DAYS Performed at [facility], [address] [place], [place] [address]    Report Status 02/15/2024 FINAL  Final  Culture, blood (Routine x 2)     Status: None   Collection Time: 02/09/24 10:17 PM   Specimen: BLOOD LEFT FOREARM  Result Value Ref Range Status   Specimen Description   Final    BLOOD LEFT FOREARM Performed at [facility], [address] [place], [place] [address]    Special Requests   Final    BOTTLES DRAWN AEROBIC AND  ANAEROBIC Blood Culture results may not be optimal due to an inadequate volume of blood received in culture bottles Performed at [facility], [address] [place], [place] [address]    Culture   Final    NO  GROWTH 5 DAYS Performed at [facility], [address], [place], [place] [address]    Report Status 02/15/2024 FINAL  Final  Urine Culture     Status: None   Collection Time: 02/09/24 10:53 PM   Specimen: Urine, Catheterized  Result Value Ref Range Status   Specimen Description   Final    URINE, CATHETERIZED Performed at [facility], [address] [place], [place] [address]    Special Requests   Final    NONE Performed at [facility], [address] [place], [place] [address]    Culture   Final    NO GROWTH Performed at [facility], [address], [place], [place] [address]    Report Status 02/11/2024 FINAL  Final  Resp panel by RT-PCR (RSV, Flu A&B, Covid) Anterior Nasal Swab     Status: None   Collection Time: 02/09/24 10:55 PM   Specimen: Anterior Nasal Swab  Result Value Ref Range Status   SARS Coronavirus 2 by RT PCR NEGATIVE NEGATIVE Final    Comment: (NOTE) SARS-CoV-2 target nucleic acids are NOT DETECTED.  The SARS-CoV-2 RNA is generally detectable in upper respiratory specimens during the acute phase of infection. The lowest concentration of SARS-CoV-2 viral copies this assay can detect is 138 copies/mL. A negative result does not preclude SARS-Cov-2 infection and should not be used as the sole basis for treatment or other patient management decisions. A negative result may occur with  improper specimen collection/handling, submission of specimen other than nasopharyngeal swab, presence of viral mutation(s) within the areas targeted by this assay, and inadequate number of viral copies(<138 copies/mL). A negative result must be combined with clinical observations, patient history,  and epidemiological information. The expected result is Negative.  Fact Sheet for Patients:  BloggerCourse.com  Fact Sheet for Healthcare Providers:  SeriousBroker.it  This test is no t yet approved or cleared by the [place] FDA and  has been authorized for detection and/or diagnosis of SARS-CoV-2 by FDA under an Emergency Use Authorization (EUA). This EUA will remain  in effect (meaning this test can be used) for the duration of the COVID-19 declaration under Section 564(b)(1) of the Act, 21 U.S.C.section 360bbb-3(b)(1), unless the authorization is terminated  or revoked sooner.       Influenza A by PCR NEGATIVE NEGATIVE Final   Influenza B by PCR NEGATIVE NEGATIVE Final    Comment: (NOTE) The Xpert Xpress SARS-CoV-2/FLU/RSV plus assay is intended as an aid in the diagnosis of influenza from Nasopharyngeal swab specimens and should not be used as a sole basis for treatment. Nasal washings and aspirates are unacceptable for Xpert Xpress SARS-CoV-2/FLU/RSV testing.  Fact Sheet for Patients: BloggerCourse.com  Fact Sheet for Healthcare Providers: SeriousBroker.it  This test is not yet approved or cleared by the [place] FDA and has been authorized for detection and/or diagnosis of SARS-CoV-2 by FDA under an Emergency Use Authorization (EUA). This EUA will remain in effect (meaning this test can be used) for the duration of the COVID-19 declaration under Section 564(b)(1) of the Act, 21 U.S.C. section 360bbb-3(b)(1), unless the authorization is terminated or revoked.     Resp Syncytial Virus by PCR NEGATIVE NEGATIVE Final    Comment: (NOTE) Fact Sheet for Patients: BloggerCourse.com  Fact Sheet for Healthcare Providers: SeriousBroker.it  This test is not yet approved or cleared by the [place] FDA and has  been authorized for detection and/or diagnosis of SARS-CoV-2 by FDA under  an Emergency Use Authorization (EUA). This EUA will remain in effect (meaning this test can be used) for the duration of the COVID-19 declaration under Section 564(b)(1) of the Act, 21 U.S.C. section 360bbb-3(b)(1), unless the authorization is terminated or revoked.  Performed at [facility], [address] [place], [place] [address]   Urine Culture (for pregnant, neutropenic or urologic patients or patients with an indwelling urinary catheter)     Status: Abnormal   Collection Time: 02/14/24 12:28 PM   Specimen: Urine, Clean Catch  Result Value Ref Range Status   Specimen Description   Final    URINE, CLEAN CATCH Performed at [facility], [address], [place], [place] [address]    Special Requests   Final    NONE Performed at [facility], [address] [place], [place] [address]    Culture >=100,000 COLONIES/mL YEAST (A)  Final   Report Status 02/17/2024 FINAL  Final  Surgical PCR screen     Status: None   Collection Time: 02/18/24  8:42 AM   Specimen: Nasal Mucosa; Nasal Swab  Result Value Ref Range Status   MRSA, PCR NEGATIVE NEGATIVE Final   Staphylococcus aureus NEGATIVE NEGATIVE Final    Comment: (NOTE) The Xpert SA Assay (FDA approved for NASAL specimens in patients 37 years of age and older), is one component of a comprehensive surveillance program. It is not intended to diagnose infection nor to guide or monitor treatment. Performed at [facility], [address] [place], [place] [address]     Time coordinating discharge: Over 30 minutes  Leeroy Bock, MD  [facility] 02/20/2024, 11:33 AM

## 2024-02-20 NOTE — Discharge Instructions (Signed)
 Follow up with urology as instructed.  Continue your antibiotic and antifungal treatments until complete.  Return if you are unable to urinate or having significant pain  Home health has been ordered to resume

## 2024-02-20 NOTE — Plan of Care (Signed)
   Problem: Elimination: Goal: Will not experience complications related to bowel motility Outcome: Progressing   Problem: Safety: Goal: Ability to remain free from injury will improve Outcome: Progressing   Problem: Skin Integrity: Goal: Risk for impaired skin integrity will decrease Outcome: Progressing

## 2024-02-25 ENCOUNTER — Emergency Department (HOSPITAL_COMMUNITY)

## 2024-02-25 ENCOUNTER — Other Ambulatory Visit: Payer: Self-pay

## 2024-02-25 ENCOUNTER — Inpatient Hospital Stay (HOSPITAL_COMMUNITY)
Admission: EM | Admit: 2024-02-25 | Discharge: 2024-03-03 | DRG: 872 | Disposition: A | Discharge status: Skilled Nursing Facility | Attending: Obstetrics and Gynecology | Admitting: Obstetrics and Gynecology

## 2024-02-25 DIAGNOSIS — R54 Age-related physical debility: Secondary | ICD-10-CM | POA: Insufficient documentation

## 2024-02-25 DIAGNOSIS — E785 Hyperlipidemia, unspecified: Secondary | ICD-10-CM | POA: Diagnosis present

## 2024-02-25 DIAGNOSIS — Z1152 Encounter for screening for COVID-19: Secondary | ICD-10-CM

## 2024-02-25 DIAGNOSIS — R0682 Tachypnea, not elsewhere classified: Secondary | ICD-10-CM | POA: Diagnosis present

## 2024-02-25 DIAGNOSIS — E876 Hypokalemia: Secondary | ICD-10-CM | POA: Diagnosis present

## 2024-02-25 DIAGNOSIS — K5289 Other specified noninfective gastroenteritis and colitis: Secondary | ICD-10-CM | POA: Diagnosis present

## 2024-02-25 DIAGNOSIS — A419 Sepsis, unspecified organism: Principal | ICD-10-CM | POA: Diagnosis present

## 2024-02-25 DIAGNOSIS — Z515 Encounter for palliative care: Secondary | ICD-10-CM

## 2024-02-25 DIAGNOSIS — I2510 Atherosclerotic heart disease of native coronary artery without angina pectoris: Secondary | ICD-10-CM | POA: Diagnosis present

## 2024-02-25 DIAGNOSIS — L89311 Pressure ulcer of right buttock, stage 1: Secondary | ICD-10-CM | POA: Diagnosis present

## 2024-02-25 DIAGNOSIS — I700 Atherosclerosis of aorta: Secondary | ICD-10-CM | POA: Diagnosis present

## 2024-02-25 DIAGNOSIS — I252 Old myocardial infarction: Secondary | ICD-10-CM

## 2024-02-25 DIAGNOSIS — K529 Noninfective gastroenteritis and colitis, unspecified: Secondary | ICD-10-CM

## 2024-02-25 DIAGNOSIS — I959 Hypotension, unspecified: Secondary | ICD-10-CM | POA: Diagnosis present

## 2024-02-25 DIAGNOSIS — Z66 Do not resuscitate: Secondary | ICD-10-CM | POA: Diagnosis present

## 2024-02-25 DIAGNOSIS — I4821 Permanent atrial fibrillation: Secondary | ICD-10-CM | POA: Diagnosis present

## 2024-02-25 DIAGNOSIS — N12 Tubulo-interstitial nephritis, not specified as acute or chronic: Secondary | ICD-10-CM

## 2024-02-25 DIAGNOSIS — E875 Hyperkalemia: Secondary | ICD-10-CM | POA: Diagnosis present

## 2024-02-25 DIAGNOSIS — E872 Acidosis, unspecified: Secondary | ICD-10-CM | POA: Diagnosis present

## 2024-02-25 DIAGNOSIS — N179 Acute kidney failure, unspecified: Secondary | ICD-10-CM | POA: Diagnosis not present

## 2024-02-25 DIAGNOSIS — N200 Calculus of kidney: Secondary | ICD-10-CM

## 2024-02-25 DIAGNOSIS — I130 Hypertensive heart and chronic kidney disease with heart failure and stage 1 through stage 4 chronic kidney disease, or unspecified chronic kidney disease: Secondary | ICD-10-CM | POA: Diagnosis present

## 2024-02-25 DIAGNOSIS — N202 Calculus of kidney with calculus of ureter: Secondary | ICD-10-CM | POA: Diagnosis present

## 2024-02-25 DIAGNOSIS — E119 Type 2 diabetes mellitus without complications: Secondary | ICD-10-CM

## 2024-02-25 DIAGNOSIS — Z79899 Other long term (current) drug therapy: Secondary | ICD-10-CM

## 2024-02-25 DIAGNOSIS — K449 Diaphragmatic hernia without obstruction or gangrene: Secondary | ICD-10-CM | POA: Diagnosis present

## 2024-02-25 DIAGNOSIS — A0811 Acute gastroenteropathy due to Norwalk agent: Secondary | ICD-10-CM | POA: Diagnosis present

## 2024-02-25 DIAGNOSIS — H9190 Unspecified hearing loss, unspecified ear: Secondary | ICD-10-CM | POA: Diagnosis present

## 2024-02-25 DIAGNOSIS — Z85820 Personal history of malignant melanoma of skin: Secondary | ICD-10-CM

## 2024-02-25 DIAGNOSIS — Z8673 Personal history of transient ischemic attack (TIA), and cerebral infarction without residual deficits: Secondary | ICD-10-CM

## 2024-02-25 DIAGNOSIS — E669 Obesity, unspecified: Secondary | ICD-10-CM | POA: Diagnosis present

## 2024-02-25 DIAGNOSIS — Z7901 Long term (current) use of anticoagulants: Secondary | ICD-10-CM

## 2024-02-25 DIAGNOSIS — E1122 Type 2 diabetes mellitus with diabetic chronic kidney disease: Secondary | ICD-10-CM | POA: Diagnosis present

## 2024-02-25 DIAGNOSIS — N184 Chronic kidney disease, stage 4 (severe): Secondary | ICD-10-CM | POA: Diagnosis present

## 2024-02-25 DIAGNOSIS — I5032 Chronic diastolic (congestive) heart failure: Secondary | ICD-10-CM | POA: Diagnosis present

## 2024-02-25 DIAGNOSIS — Z7984 Long term (current) use of oral hypoglycemic drugs: Secondary | ICD-10-CM

## 2024-02-25 DIAGNOSIS — Z6830 Body mass index (BMI) 30.0-30.9, adult: Secondary | ICD-10-CM

## 2024-02-25 DIAGNOSIS — F0390 Unspecified dementia without behavioral disturbance: Secondary | ICD-10-CM | POA: Diagnosis present

## 2024-02-25 LAB — CBC WITH DIFFERENTIAL/PLATELET
Abs Immature Granulocytes: 0.24 10*3/uL — ABNORMAL HIGH (ref 0.00–0.07)
Basophils Absolute: 0.1 10*3/uL (ref 0.0–0.1)
Basophils Relative: 0 %
Eosinophils Absolute: 0 10*3/uL (ref 0.0–0.5)
Eosinophils Relative: 0 %
HCT: 33.8 % — ABNORMAL LOW (ref 39.0–52.0)
Hemoglobin: 10.7 g/dL — ABNORMAL LOW (ref 13.0–17.0)
Immature Granulocytes: 1 %
Lymphocytes Relative: 4 %
Lymphs Abs: 0.9 10*3/uL (ref 0.7–4.0)
MCH: 30.9 pg (ref 26.0–34.0)
MCHC: 31.7 g/dL (ref 30.0–36.0)
MCV: 97.7 fL (ref 80.0–100.0)
Monocytes Absolute: 1.5 10*3/uL — ABNORMAL HIGH (ref 0.1–1.0)
Monocytes Relative: 7 %
Neutro Abs: 19.6 10*3/uL — ABNORMAL HIGH (ref 1.7–7.7)
Neutrophils Relative %: 88 %
Platelets: 375 10*3/uL (ref 150–400)
RBC: 3.46 MIL/uL — ABNORMAL LOW (ref 4.22–5.81)
RDW: 13 % (ref 11.5–15.5)
WBC: 22.4 10*3/uL — ABNORMAL HIGH (ref 4.0–10.5)
nRBC: 0 % (ref 0.0–0.2)

## 2024-02-25 LAB — COMPREHENSIVE METABOLIC PANEL
ALT: 19 U/L (ref 0–44)
AST: 21 U/L (ref 15–41)
Albumin: 2.8 g/dL — ABNORMAL LOW (ref 3.5–5.0)
Alkaline Phosphatase: 45 U/L (ref 38–126)
Anion gap: 16 — ABNORMAL HIGH (ref 5–15)
BUN: 50 mg/dL — ABNORMAL HIGH (ref 8–23)
CO2: 18 mmol/L — ABNORMAL LOW (ref 22–32)
Calcium: 9 mg/dL (ref 8.9–10.3)
Chloride: 107 mmol/L (ref 98–111)
Creatinine, Ser: 3.35 mg/dL — ABNORMAL HIGH (ref 0.61–1.24)
GFR, Estimated: 17 mL/min — ABNORMAL LOW (ref >60)
Glucose, Bld: 146 mg/dL — ABNORMAL HIGH (ref 70–99)
Potassium: 5.3 mmol/L — ABNORMAL HIGH (ref 3.5–5.1)
Sodium: 141 mmol/L (ref 135–145)
Total Bilirubin: 0.9 mg/dL (ref 0.0–1.2)
Total Protein: 7.2 g/dL (ref 6.5–8.1)

## 2024-02-25 LAB — I-STAT CG4 LACTIC ACID, ED: Lactic Acid, Venous: 1.7 mmol/L (ref 0.5–1.9)

## 2024-02-25 LAB — TROPONIN I (HIGH SENSITIVITY)
Troponin I (High Sensitivity): 9 ng/L (ref <18)
Troponin I (High Sensitivity): 10 ng/L (ref <18)

## 2024-02-25 MED ORDER — SODIUM CHLORIDE 0.9 % IV BOLUS
500.0000 mL | Freq: Once | INTRAVENOUS | Status: AC
  Administered 2024-02-25: 500 mL via INTRAVENOUS

## 2024-02-25 NOTE — ED Provider Notes (Signed)
 [place] EMERGENCY DEPARTMENT AT [facility] Provider Note   CSN: [id number] Arrival date & time: 02/25/24  1951     History {Add pertinent medical, surgical, social history, OB history to HPI:1} Chief Complaint  Patient presents with   Weakness    Patient brought in by [facility] per EMS family called due to patient weakness. Patient has not be able to stand without assistance which is not baseline for him. Family reported patient can't even get up off the toilet on his own. This is been going on for about 2-3 days. Patient recently seen for UTI hx kidney infections. Medic vitals  BS 140 Bp 159/89 HR120 afib 98% 2L [place] per medics due to patient 02 sats at 89-90% on room air.     Charles Richard is a 89 y.o. male.  Patient is a 89 year old male who presents with weakness.  He has a history of diabetes, hyperlipidemia, prior CVA, dementia, hypertension, AAA, A-fib on Eliquis.  He was recently admitted to the hospital from March 1 to March 12.  He previously was known to have ureteral stones.  He was admitted for UTI/sepsis.  He had stents placed that were ultimately removed.  He was treated with Rocephin.  He was discharged to home.  Family has been taking care of him.  They state he is having ongoing weakness at home.  He is getting physical therapy but remains weak.  Today he seemed weaker than he had before.  He was not able to get off the toilet.  He has not had any known fevers.  No vomiting.  Had a little bit of loose stools.  No cough or cold symptoms.       Home Medications Prior to Admission medications   Medication Sig Start Date End Date Taking? Authorizing Provider  apixaban (ELIQUIS) 5 MG TABS tablet Take 2.5 mg by mouth 2 (two) times daily.    [provider]  carvedilol (COREG) 6.25 MG tablet Take 1 tablet (6.25 mg total) by mouth 2 (two) times daily with a meal. Patient taking differently: Take 6.25 mg by mouth daily. 08/02/21   Rodolph Bong,  MD  gabapentin (NEURONTIN) 300 MG capsule Take 1 capsule (300 mg total) by mouth at bedtime. 01/09/24   Lanae Boast, MD  Glucerna (GLUCERNA) LIQD Take 237 mLs by mouth daily.    [provider]  latanoprost (XALATAN) 0.005 % ophthalmic solution Place 1 drop into both eyes at bedtime.    [provider]  loratadine (CLARITIN) 10 MG tablet Take 10 mg by mouth daily with breakfast.     [provider]  Multiple Vitamin (MULTIVITAMIN WITH MINERALS) TABS tablet Take 1 tablet by mouth daily.    [provider]  mupirocin ointment (BACTROBAN) 2 % Apply 1 Application topically 2 (two) times daily.    [provider]  nystatin-triamcinolone (MYCOLOG II) cream Apply 1 Application topically as needed (sores). 09/18/22   [provider]  ondansetron (ZOFRAN) 8 MG tablet Take 8 mg by mouth daily as needed for nausea or vomiting.    [provider]  Propylene Glycol 0.6 % SOLN Place 2 drops into both eyes daily as needed (dry eyes).    [provider]  simvastatin (ZOCOR) 20 MG tablet Take 10 mg by mouth daily.    [provider]  traMADol (ULTRAM) 50 MG tablet Take 50 mg by mouth at bedtime as needed for moderate pain (pain score 4-6).  [provider]  VITAMIN D PO Take 1 capsule by mouth daily.    [provider]      Allergies    Patient has no known allergies.    Review of Systems   Review of Systems  Unable to perform ROS: Dementia    Physical Exam Updated Vital Signs BP 126/86 (BP Location: Left Arm)   Pulse (!) 110   Temp 98.1 F (36.7 C) (Oral)   Resp (!) 23   SpO2 100%  Physical Exam Constitutional:      Appearance: He is well-developed.  HENT:     Head: Normocephalic and atraumatic.  Eyes:     Pupils: Pupils are equal, round, and reactive to light.  Cardiovascular:     Rate and Rhythm: Regular rhythm. Tachycardia present.     Heart sounds: Normal heart sounds.  Pulmonary:      Effort: Pulmonary effort is normal. No respiratory distress.     Breath sounds: Normal breath sounds. No wheezing or rales.  Chest:     Chest wall: No tenderness.  Abdominal:     General: Bowel sounds are normal.     Palpations: Abdomen is soft.     Tenderness: There is no abdominal tenderness. There is no guarding or rebound.  Musculoskeletal:        General: Normal range of motion.     Cervical back: Normal range of motion and neck supple.  Lymphadenopathy:     Cervical: No cervical adenopathy.  Skin:    General: Skin is warm and dry.     Findings: No rash.  Neurological:     General: No focal deficit present.     Mental Status: He is alert.     Comments: Oriented to person place and year     ED Results / Procedures / Treatments   Labs (all labs ordered are listed, but only abnormal results are displayed) Labs Reviewed  RESP PANEL BY RT-PCR (RSV, FLU A&B, COVID)  [id number]  COMPREHENSIVE METABOLIC PANEL  CBC WITH DIFFERENTIAL/PLATELET  URINALYSIS, W/ REFLEX TO CULTURE (INFECTION SUSPECTED)  TROPONIN I (HIGH SENSITIVITY)    EKG None  Radiology No results found.  Procedures Procedures  {Document cardiac monitor, telemetry assessment procedure when appropriate:1}  Medications Ordered in ED Medications  sodium chloride 0.9 % bolus 500 mL (has no administration in time range)    ED Course/ Medical Decision Making/ A&P   {   Click here for ABCD2, HEART and other calculatorsREFRESH Note before signing :1}                              Medical Decision Making Amount and/or Complexity of Data Reviewed Labs: ordered. Radiology: ordered.   ***  {Document critical care time when appropriate:1} {Document review of labs and clinical decision tools ie heart score, Chads2Vasc2 etc:1}  {Document your independent review of radiology images, and any outside records:1} {Document your discussion with family members, caretakers, and with consultants:1} {Document social  determinants of health affecting pt's care:1} {Document your decision making why or why not admission, treatments were needed:1} Final Clinical Impression(s) / ED Diagnoses Final diagnoses:  None    Rx / DC Orders ED Discharge Orders     None

## 2024-02-26 ENCOUNTER — Encounter (HOSPITAL_COMMUNITY): Payer: Self-pay | Admitting: Internal Medicine

## 2024-02-26 DIAGNOSIS — N179 Acute kidney failure, unspecified: Secondary | ICD-10-CM | POA: Diagnosis present

## 2024-02-26 DIAGNOSIS — N200 Calculus of kidney: Secondary | ICD-10-CM

## 2024-02-26 DIAGNOSIS — Z66 Do not resuscitate: Secondary | ICD-10-CM | POA: Diagnosis present

## 2024-02-26 DIAGNOSIS — A419 Sepsis, unspecified organism: Secondary | ICD-10-CM | POA: Diagnosis present

## 2024-02-26 DIAGNOSIS — F03C0 Unspecified dementia, severe, without behavioral disturbance, psychotic disturbance, mood disturbance, and anxiety: Secondary | ICD-10-CM | POA: Diagnosis not present

## 2024-02-26 DIAGNOSIS — E119 Type 2 diabetes mellitus without complications: Secondary | ICD-10-CM

## 2024-02-26 DIAGNOSIS — Z7189 Other specified counseling: Secondary | ICD-10-CM | POA: Diagnosis not present

## 2024-02-26 DIAGNOSIS — N12 Tubulo-interstitial nephritis, not specified as acute or chronic: Secondary | ICD-10-CM

## 2024-02-26 DIAGNOSIS — N184 Chronic kidney disease, stage 4 (severe): Secondary | ICD-10-CM | POA: Diagnosis present

## 2024-02-26 DIAGNOSIS — F0390 Unspecified dementia without behavioral disturbance: Secondary | ICD-10-CM | POA: Diagnosis present

## 2024-02-26 DIAGNOSIS — I252 Old myocardial infarction: Secondary | ICD-10-CM | POA: Diagnosis not present

## 2024-02-26 DIAGNOSIS — I130 Hypertensive heart and chronic kidney disease with heart failure and stage 1 through stage 4 chronic kidney disease, or unspecified chronic kidney disease: Secondary | ICD-10-CM | POA: Diagnosis present

## 2024-02-26 DIAGNOSIS — K529 Noninfective gastroenteritis and colitis, unspecified: Secondary | ICD-10-CM | POA: Diagnosis not present

## 2024-02-26 DIAGNOSIS — I4821 Permanent atrial fibrillation: Secondary | ICD-10-CM

## 2024-02-26 DIAGNOSIS — N189 Chronic kidney disease, unspecified: Secondary | ICD-10-CM

## 2024-02-26 DIAGNOSIS — E875 Hyperkalemia: Secondary | ICD-10-CM

## 2024-02-26 DIAGNOSIS — E876 Hypokalemia: Secondary | ICD-10-CM | POA: Diagnosis present

## 2024-02-26 DIAGNOSIS — I2510 Atherosclerotic heart disease of native coronary artery without angina pectoris: Secondary | ICD-10-CM | POA: Diagnosis present

## 2024-02-26 DIAGNOSIS — I700 Atherosclerosis of aorta: Secondary | ICD-10-CM | POA: Diagnosis present

## 2024-02-26 DIAGNOSIS — Z8673 Personal history of transient ischemic attack (TIA), and cerebral infarction without residual deficits: Secondary | ICD-10-CM

## 2024-02-26 DIAGNOSIS — R54 Age-related physical debility: Secondary | ICD-10-CM

## 2024-02-26 DIAGNOSIS — E669 Obesity, unspecified: Secondary | ICD-10-CM | POA: Diagnosis present

## 2024-02-26 DIAGNOSIS — N202 Calculus of kidney with calculus of ureter: Secondary | ICD-10-CM | POA: Diagnosis present

## 2024-02-26 DIAGNOSIS — L89311 Pressure ulcer of right buttock, stage 1: Secondary | ICD-10-CM | POA: Diagnosis present

## 2024-02-26 DIAGNOSIS — Z515 Encounter for palliative care: Secondary | ICD-10-CM | POA: Diagnosis not present

## 2024-02-26 DIAGNOSIS — N390 Urinary tract infection, site not specified: Secondary | ICD-10-CM | POA: Diagnosis not present

## 2024-02-26 DIAGNOSIS — A0811 Acute gastroenteropathy due to Norwalk agent: Secondary | ICD-10-CM | POA: Diagnosis present

## 2024-02-26 DIAGNOSIS — E1122 Type 2 diabetes mellitus with diabetic chronic kidney disease: Secondary | ICD-10-CM | POA: Diagnosis present

## 2024-02-26 DIAGNOSIS — Z1152 Encounter for screening for COVID-19: Secondary | ICD-10-CM | POA: Diagnosis not present

## 2024-02-26 DIAGNOSIS — E785 Hyperlipidemia, unspecified: Secondary | ICD-10-CM | POA: Diagnosis present

## 2024-02-26 DIAGNOSIS — K5289 Other specified noninfective gastroenteritis and colitis: Secondary | ICD-10-CM | POA: Diagnosis not present

## 2024-02-26 DIAGNOSIS — I5032 Chronic diastolic (congestive) heart failure: Secondary | ICD-10-CM | POA: Diagnosis present

## 2024-02-26 DIAGNOSIS — E872 Acidosis, unspecified: Secondary | ICD-10-CM | POA: Diagnosis present

## 2024-02-26 LAB — COMPREHENSIVE METABOLIC PANEL
ALT: 15 U/L (ref 0–44)
AST: 16 U/L (ref 15–41)
Albumin: 2.2 g/dL — ABNORMAL LOW (ref 3.5–5.0)
Alkaline Phosphatase: 36 U/L — ABNORMAL LOW (ref 38–126)
Anion gap: 14 (ref 5–15)
BUN: 47 mg/dL — ABNORMAL HIGH (ref 8–23)
CO2: 18 mmol/L — ABNORMAL LOW (ref 22–32)
Calcium: 8.5 mg/dL — ABNORMAL LOW (ref 8.9–10.3)
Chloride: 110 mmol/L (ref 98–111)
Creatinine, Ser: 2.99 mg/dL — ABNORMAL HIGH (ref 0.61–1.24)
GFR, Estimated: 19 mL/min — ABNORMAL LOW (ref >60)
Glucose, Bld: 105 mg/dL — ABNORMAL HIGH (ref 70–99)
Potassium: 4.5 mmol/L (ref 3.5–5.1)
Sodium: 142 mmol/L (ref 135–145)
Total Bilirubin: 1 mg/dL (ref 0.0–1.2)
Total Protein: 6.1 g/dL — ABNORMAL LOW (ref 6.5–8.1)

## 2024-02-26 LAB — SODIUM, URINE, RANDOM: Sodium, Ur: 87 mmol/L

## 2024-02-26 LAB — URINALYSIS, W/ REFLEX TO CULTURE (INFECTION SUSPECTED)
Bacteria, UA: NONE SEEN
Bilirubin Urine: NEGATIVE
Glucose, UA: NEGATIVE mg/dL
Ketones, ur: 5 mg/dL — AB
Nitrite: NEGATIVE
Protein, ur: 100 mg/dL — AB
RBC / HPF: >50 RBC/hpf (ref 0–5)
Specific Gravity, Urine: 1.01 (ref 1.005–1.030)
WBC, UA: >50 WBC/hpf (ref 0–5)
pH: 5 (ref 5.0–8.0)

## 2024-02-26 LAB — C DIFFICILE QUICK SCREEN W PCR REFLEX
C Diff antigen: NEGATIVE
C Diff interpretation: NOT DETECTED
C Diff toxin: NEGATIVE

## 2024-02-26 LAB — RESP PANEL BY RT-PCR (RSV, FLU A&B, COVID)  RVPGX2
Influenza A by PCR: NEGATIVE
Influenza B by PCR: NEGATIVE
Resp Syncytial Virus by PCR: NEGATIVE
SARS Coronavirus 2 by RT PCR: NEGATIVE

## 2024-02-26 LAB — CBG MONITORING, ED: Glucose-Capillary: 92 mg/dL (ref 70–99)

## 2024-02-26 LAB — CBC
HCT: 31.9 % — ABNORMAL LOW (ref 39.0–52.0)
Hemoglobin: 9.5 g/dL — ABNORMAL LOW (ref 13.0–17.0)
MCH: 30.4 pg (ref 26.0–34.0)
MCHC: 29.8 g/dL — ABNORMAL LOW (ref 30.0–36.0)
MCV: 101.9 fL — ABNORMAL HIGH (ref 80.0–100.0)
Platelets: 321 10*3/uL (ref 150–400)
RBC: 3.13 MIL/uL — ABNORMAL LOW (ref 4.22–5.81)
RDW: 13.1 % (ref 11.5–15.5)
WBC: 18.6 10*3/uL — ABNORMAL HIGH (ref 4.0–10.5)
nRBC: 0 % (ref 0.0–0.2)

## 2024-02-26 LAB — CREATININE, URINE, RANDOM: Creatinine, Urine: 74 mg/dL

## 2024-02-26 LAB — GLUCOSE, CAPILLARY: Glucose-Capillary: 95 mg/dL (ref 70–99)

## 2024-02-26 MED ORDER — INSULIN ASPART 100 UNIT/ML IJ SOLN
2.0000 [IU] | INTRAMUSCULAR | Status: DC
  Filled 2024-02-26: qty 0.02

## 2024-02-26 MED ORDER — ONDANSETRON HCL 4 MG/2ML IJ SOLN: 4.0000 mg | Freq: Four times a day (QID) | INTRAMUSCULAR | Status: DC | PRN

## 2024-02-26 MED ORDER — CARVEDILOL 6.25 MG PO TABS
6.2500 mg | ORAL_TABLET | Freq: Two times a day (BID) | ORAL | Status: DC
  Administered 2024-02-26 – 2024-03-03 (×13): 6.25 mg via ORAL
  Filled 2024-02-26 (×7): qty 1
  Filled 2024-02-26: qty 2
  Filled 2024-02-26 (×5): qty 1

## 2024-02-26 MED ORDER — SODIUM CHLORIDE 0.9% FLUSH
3.0000 mL | Freq: Two times a day (BID) | INTRAVENOUS | Status: DC
  Administered 2024-02-26 – 2024-03-03 (×13): 3 mL via INTRAVENOUS

## 2024-02-26 MED ORDER — CALCIUM GLUCONATE-NACL 1-0.675 GM/50ML-% IV SOLN
1.0000 g | INTRAVENOUS | Status: AC
  Administered 2024-02-26: 1000 mg via INTRAVENOUS
  Filled 2024-02-26: qty 50

## 2024-02-26 MED ORDER — SIMVASTATIN 10 MG PO TABS
10.0000 mg | ORAL_TABLET | Freq: Every evening | ORAL | Status: DC
  Administered 2024-02-26 – 2024-03-02 (×6): 10 mg via ORAL
  Filled 2024-02-26 (×5): qty 1

## 2024-02-26 MED ORDER — SODIUM ZIRCONIUM CYCLOSILICATE 10 G PO PACK
10.0000 g | PACK | Freq: Once | ORAL | Status: AC
  Administered 2024-02-26: 10 g via ORAL
  Filled 2024-02-26: qty 1

## 2024-02-26 MED ORDER — ACETAMINOPHEN 325 MG PO TABS
650.0000 mg | ORAL_TABLET | Freq: Four times a day (QID) | ORAL | Status: DC | PRN
  Administered 2024-02-26 – 2024-02-29 (×2): 650 mg via ORAL
  Filled 2024-02-26 (×3): qty 2

## 2024-02-26 MED ORDER — METRONIDAZOLE 500 MG/100ML IV SOLN
500.0000 mg | Freq: Once | INTRAVENOUS | Status: AC
  Administered 2024-02-26: 500 mg via INTRAVENOUS
  Filled 2024-02-26: qty 100

## 2024-02-26 MED ORDER — METRONIDAZOLE 500 MG/100ML IV SOLN
500.0000 mg | Freq: Two times a day (BID) | INTRAVENOUS | Status: DC
  Administered 2024-02-26 – 2024-02-27 (×4): 500 mg via INTRAVENOUS
  Filled 2024-02-26 (×4): qty 100

## 2024-02-26 MED ORDER — SODIUM CHLORIDE 0.9 % IV SOLN: 250.0000 mL | INTRAVENOUS | Status: AC | PRN

## 2024-02-26 MED ORDER — SODIUM CHLORIDE 0.9% FLUSH: 3.0000 mL | INTRAVENOUS | Status: DC | PRN

## 2024-02-26 MED ORDER — DEXTROSE 50 % IV SOLN: 50.0000 mL | INTRAVENOUS | Status: DC

## 2024-02-26 MED ORDER — APIXABAN 2.5 MG PO TABS
2.5000 mg | ORAL_TABLET | Freq: Two times a day (BID) | ORAL | Status: DC
  Administered 2024-02-26 – 2024-03-03 (×13): 2.5 mg via ORAL
  Filled 2024-02-26 (×13): qty 1

## 2024-02-26 MED ORDER — ACETAMINOPHEN 650 MG RE SUPP: 650.0000 mg | Freq: Four times a day (QID) | RECTAL | Status: DC | PRN

## 2024-02-26 MED ORDER — SODIUM CHLORIDE 0.9 % IV SOLN
2.0000 g | Freq: Once | INTRAVENOUS | Status: AC
  Administered 2024-02-26: 2 g via INTRAVENOUS
  Filled 2024-02-26: qty 20

## 2024-02-26 MED ORDER — LACTATED RINGERS IV SOLN: INTRAVENOUS | Status: DC

## 2024-02-26 MED ORDER — SODIUM CHLORIDE 0.9 % IV SOLN: INTRAVENOUS | Status: DC

## 2024-02-26 MED ORDER — ONDANSETRON HCL 4 MG PO TABS: 4.0000 mg | ORAL_TABLET | Freq: Four times a day (QID) | ORAL | Status: DC | PRN

## 2024-02-26 MED ORDER — STERILE WATER FOR INJECTION IV SOLN
INTRAVENOUS | Status: AC
  Filled 2024-02-26: qty 1000
  Filled 2024-02-26: qty 150

## 2024-02-26 MED ORDER — SODIUM CHLORIDE 0.9 % IV SOLN
2.0000 g | INTRAVENOUS | Status: DC
  Administered 2024-02-27 (×2): 2 g via INTRAVENOUS
  Filled 2024-02-26 (×2): qty 20

## 2024-02-26 MED ORDER — SODIUM CHLORIDE 0.9 % IV BOLUS
1000.0000 mL | Freq: Once | INTRAVENOUS | Status: AC
  Administered 2024-02-26: 1000 mL via INTRAVENOUS

## 2024-02-26 MED ORDER — GABAPENTIN 300 MG PO CAPS
300.0000 mg | ORAL_CAPSULE | Freq: Every day | ORAL | Status: DC
  Administered 2024-02-26 – 2024-03-02 (×6): 300 mg via ORAL
  Filled 2024-02-26 (×6): qty 1

## 2024-02-26 NOTE — Progress Notes (Addendum)
   Patient seen and examined at bedside, patient admitted after midnight, please see earlier detailed admission note by Tereasa Coop, MD. Briefly, patient presented weakness and found to have evidence of sepsis, secondary to colitis seen on imaging and pyel  Subjective: Patient reports feeling better than on admission. No urinary symptoms. Reports having a bowel movement earlier today.  BP (!) 122/54   Pulse (!) 110   Temp 98.5 F (36.9 C)   Resp 14   SpO2 98%   General exam: Appears calm and comfortable Respiratory system: Clear to auscultation. Respiratory effort normal. Cardiovascular system: S1 & S2 heard, RRR.Marland Kitchen Gastrointestinal system: Abdomen is nondistended, soft and nontender. Normal bowel sounds heard. Central nervous system: Alert. Hard of hearing. Psychiatry: Judgement and insight appear normal. Mood & affect appropriate.   Brief assessment/Plan:  Sepsis secondary to pyelonephritis and acute colitis Present on admission. Blood and urine cultures obtained. Empiric antibiotics initiated. Colitis noted on CT imaging with possible evidence of stercolitis. No evidence of pyelonephritis and urinalysis not highly suggestive of infection. -Continue Ceftriaxone and Flagyl -Follow-up culture data -Follow-up GI pathogen panel and C. Difficile testing -CBC in AM  Hyperkalemia Mild. Given calcium gluconate, insulin and Lokelma. -Recheck BMP  AKI on CKD stageIIIb Associated metabolic acidosis. Started on sodium bicarbonate IV infusion. -Continue IV fluids -Recheck BMP  Aortic atherosclerosis Noted on CT imaging.  "Small bowel" calcification noted on CT imaging Discussed with radiology, likely calcified artery of the mesentery or renal arteries. No management needed.  Diabetes mellitus type 2 Permanent atrial fibrillation Primary hypertension Physical debility Per H&P  Family communication: None at bedside DVT prophylaxis: Eliquis Disposition: Discharge pending  continued sepsis workup and eventual transition to outpatient antibiotic regimen  Jacquelin Hawking, MD [facility] 02/26/2024, 7:35 AM

## 2024-02-26 NOTE — H&P (Signed)
 History and Physical    Charles Richard NFA:[id number] DOB: March 24, 1930 DOA: 02/25/2024  PCP: [facility]   Patient coming from: Home   Chief Complaint:  Chief Complaint  Patient presents with   Weakness    Patient brought in by [facility] per EMS family called due to patient weakness. Patient has not be able to stand without assistance which is not baseline for him. Family reported patient can't even get up off the toilet on his own. This is been going on for about 2-3 days. Patient recently seen for UTI hx kidney infections. Medic vitals  BS 140 Bp 159/89 HR120 afib 98% 2L [place] per medics due to patient 02 sats at 89-90% on room air.    ED TRIAGE note:Patient brought in by [facility] per EMS family called due to patient weakness. Patient has not be able to stand without assistance which is not baseline for him. Family reported patient can't even get up off the toilet on his own. This is been going on for about 2-3 days. Patient recently seen for UTI hx kidney infections. Medic vitals BS 140 Bp 159/89 HR120 afib 98% 2L [place] per medics due to patient 02 sats at 89-90% on room air.   HPI:  Charles Richard is a 89 y.o. male with medical history significant of dementia, AAA, permanent atrial fibrillation on Eliquis, DM type II, essential hypertension, impaired hearing, CVA, hyperlipidemia, CKD stage IIIb and history of recurrent UTI status post cystoscopy left-sided stent placement then removal on 02/19/2024 presented to emergency department with complaining of generalized weakness. Family reported at home patient has ongoing weakness.  He is getting physical therapy but remain weak.  He seemed more weaker than before.  He is unable to get off from the toilet by himself.  Denies any fever, nausea, vomiting and abdominal pain.  Patient is having loose stool.  Patient was admitted 3/1 to 3/12 for urosepsis in the setting of left ureteral stone status post stent placement afterward  removal on 3/11.  Patient underwent ureteroscopy and laser left ureteral stone removal on 3/11.  Patient has been treated with UTI both bacterial and fungal and completed antibiotic course.  ED Course:  At presentation to ED patient is tachycardic, tachypneic otherwise blood pressure within good range and O2 sat 100% room air. UA showed evidence of UTI.  Pending urine culture. Normal lactic acid level 1.7. Blood cultures are in process. CBC showing leukocytosis to 22.4, stable H&H 10.7 and 33.8 and normal platelet count. CMP showing elevated potassium 5.8, low bicarb 18, elevated creatinine 3.35, low albumin 2.8 elevated anion gap 16, elevated BUN 40. Normal troponin level 9. EKG showing atrial fibrillation heart rate 125.  Chest x-ray no active disease process.  CT abdomen pelvis: 1. Bilateral nephrolithiasis. There is a mild prominence of the renal collecting system on the right at the level of the pelvis with no evidence of obstructing stone. 2. Distention of the rectum with colonic wall thickening and mild surrounding fat stranding, possible stercoral colitis. 3. Small hiatal hernia. 4. Stable compression deformity/fracture in the T11 vertebral body. 5. Aortic atherosclerosis and coronary artery calcifications   In the ED patient has been given ceftriaxone and metronidazole and 2 L of LR bolus.  Hospitalist has been consulted for further evaluation management of sepsis secondary to UTI, right-sided pyelonephritis, colitis, AKI, hyperkalemia, and anion gap Intermedic acidosis.  Significant labs in the ED: Lab Orders         Resp panel  by RT-PCR (RSV, Flu A&B, Covid) Anterior Nasal Swab         Culture, blood (routine x 2)         Urine Culture         Gastrointestinal Panel by PCR , Stool         C Difficile Quick Screen w PCR reflex         Comprehensive metabolic panel         CBC with Differential         Urinalysis, w/ Reflex to Culture (Infection Suspected) -Urine, Clean  Catch         CBC         Comprehensive metabolic panel         Creatinine, urine, random         Sodium, urine, random         I-Stat Lactic Acid         CBG monitoring, ED       Review of Systems:  Review of Systems  Constitutional:  Negative for chills and fever.  Respiratory:  Negative for cough, hemoptysis, sputum production and shortness of breath.   Cardiovascular:  Negative for chest pain and palpitations.  Gastrointestinal:  Negative for abdominal pain, blood in stool, constipation, diarrhea, nausea and vomiting.  Genitourinary:  Negative for dysuria, flank pain, frequency, hematuria and urgency.  Musculoskeletal:  Negative for back pain, falls, joint pain, myalgias and neck pain.  Neurological:  Negative for dizziness.  Psychiatric/Behavioral:  The patient is not nervous/anxious.   All other systems reviewed and are negative.   Past Medical History:  Diagnosis Date   AAA (abdominal aortic aneurysm) without rupture (HCC) 06/03/2017   Arthritis    Atrial fibrillation with rapid ventricular response (HCC) 04/2017   New diagnosis during admission for urosepsis secondary to left nephrolithiasis/ureteral stone with hydronephrosis   Cancer (HCC)    melanoma excised from top of head   COVID-19    04/2020   Dementia (HCC)    Diabetes mellitus without complication (HCC)    Dysrhythmia    a fib   Essential hypertension    Hard of hearing    History of CVA (cerebrovascular accident) without residual deficits 05/2009   Admitted with expressive aphasia & confusion following a neurocardiogenic syncope event (related to dehydration, AKD & BB related hypotension/bradycardia)   History of kidney stones    Hyperlipidemia    Left nephrolithiasis 04/2017   s/p L Ureteral stent for L hydronephrosis & UTI/Urosepsis.     Myocardial infarction (HCC)    Sepsis (HCC)    Skin ulcers of foot, bilateral (HCC)    Stroke (HCC)    mild stroke no residuals    Past Surgical History:   Procedure Laterality Date   CHOLECYSTECTOMY     CYSTOSCOPY W/ RETROGRADES Left 02/18/2024   Procedure: CYSTOSCOPY, WITH RETROGRADE PYELOGRAM;  Surgeon: Jerilee Field, MD;  Location: [facility];  Service: Urology;  Laterality: Left;   CYSTOSCOPY W/ URETERAL STENT PLACEMENT Left 05/09/2017   Procedure: CYSTOSCOPY WITH RETROGRADE PYELOGRAM/URETERAL STENT PLACEMENT;  Surgeon: Jerilee Field, MD;  Location: [facility];  Service: Urology;  Laterality: Left;   CYSTOSCOPY W/ URETERAL STENT PLACEMENT Left 01/05/2024   Procedure: CYSTOSCOPY WITH RETROGRADE PYELOGRAM/URETERAL STENT PLACEMENT;  Surgeon: Malen Gauze, MD;  Location: [facility];  Service: Urology;  Laterality: Left;   CYSTOSCOPY WITH RETROGRADE PYELOGRAM, URETEROSCOPY AND STENT PLACEMENT Left 06/08/2017   Procedure: CYSTOSCOPY WITH LEFT RETROGRADE PYELOGRAM, URETEROSCOPY HOLMIUM LASER  LITHOTRIPSY BASKET EXTRACTION  AND STENT EXCHANGE;  Surgeon: Jerilee Field, MD;  Location: [facility];  Service: Urology;  Laterality: Left;   CYSTOSCOPY/URETEROSCOPY/HOLMIUM LASER/STENT PLACEMENT Left 02/18/2024   Procedure: CYSTOSCOPY/URETEROSCOPY/HOLMIUM LASER/STENT PLACEMENT;  Surgeon: Jerilee Field, MD;  Location: [facility];  Service: Urology;  Laterality: Left;   EYE SURGERY     right   HOLMIUM LASER APPLICATION Left 06/08/2017   Procedure: HOLMIUM LASER APPLICATION;  Surgeon: Jerilee Field, MD;  Location: [facility];  Service: Urology;  Laterality: Left;   LITHOTRIPSY     REVERSE SHOULDER ARTHROPLASTY Right 07/20/2017   Procedure: REVERSE RIGHT SHOULDER ARTHROPLASTY;  Surgeon: Yolonda Kida, MD;  Location[facility];  Service: Orthopedics;  Laterality: Right;  150 mins     reports that he has never smoked. He has never used smokeless tobacco. He reports that he does not drink alcohol and does not use drugs.  No Known Allergies  Family History  Problem Relation Age of Onset   Lung cancer Father        black lung   CAD Neg Hx    Kidney Stones Neg  Hx    Aneurysm Neg Hx     Prior to Admission medications   Medication Sig Start Date End Date Taking? Authorizing Provider  apixaban (ELIQUIS) 5 MG TABS tablet Take 2.5 mg by mouth 2 (two) times daily.    [provider]  carvedilol (COREG) 6.25 MG tablet Take 1 tablet (6.25 mg total) by mouth 2 (two) times daily with a meal. Patient taking differently: Take 6.25 mg by mouth daily. 08/02/21   Rodolph Bong, MD  gabapentin (NEURONTIN) 300 MG capsule Take 1 capsule (300 mg total) by mouth at bedtime. 01/09/24   Lanae Boast, MD  Glucerna (GLUCERNA) LIQD Take 237 mLs by mouth daily.    [provider]  latanoprost (XALATAN) 0.005 % ophthalmic solution Place 1 drop into both eyes at bedtime.    [provider]  loratadine (CLARITIN) 10 MG tablet Take 10 mg by mouth daily with breakfast.     [provider]  Multiple Vitamin (MULTIVITAMIN WITH MINERALS) TABS tablet Take 1 tablet by mouth daily.    [provider]  mupirocin ointment (BACTROBAN) 2 % Apply 1 Application topically 2 (two) times daily.    [provider]  nystatin-triamcinolone (MYCOLOG II) cream Apply 1 Application topically as needed (sores). 09/18/22   [provider]  ondansetron (ZOFRAN) 8 MG tablet Take 8 mg by mouth daily as needed for nausea or vomiting.    [provider]  Propylene Glycol 0.6 % SOLN Place 2 drops into both eyes daily as needed (dry eyes).    [provider]  simvastatin (ZOCOR) 20 MG tablet Take 10 mg by mouth daily.    [provider]  traMADol (ULTRAM) 50 MG tablet Take 50 mg by mouth at bedtime as needed for moderate pain (pain score 4-6).    [provider]  VITAMIN D PO Take 1 capsule by mouth daily.    [provider]     Physical Exam: Vitals:   02/26/24 0100 02/26/24 0130 02/26/24 0200 02/26/24 0206  BP: (!) 140/81 128/75 119/71   Pulse: (!) 123 (!) 120 (!) 101 97  Resp: (!) 23 18 17 15    Temp:    98.1 F (36.7 C)  TempSrc:      SpO2: 98% 99% 97% 99%    Physical Exam Constitutional:      Appearance: He is  not ill-appearing.     Comments: Pleasantly confused  HENT:     Head: Normocephalic.     Mouth/Throat:     Mouth: Mucous membranes are dry.  Eyes:     Pupils: Pupils are equal, round, and reactive to light.  Cardiovascular:     Rate and Rhythm: Regular rhythm. Tachycardia present.     Pulses: Normal pulses.     Heart sounds: Normal heart sounds.  Pulmonary:     Effort: Pulmonary effort is normal.     Breath sounds: Normal breath sounds.  Abdominal:     Palpations: Abdomen is soft.     Tenderness: There is no abdominal tenderness. There is no right CVA tenderness or left CVA tenderness.  Musculoskeletal:     Cervical back: Normal range of motion and neck supple.     Right lower leg: No edema.     Left lower leg: No edema.  Skin:    Capillary Refill: Capillary refill takes less than 2 seconds.  Neurological:     Mental Status: He is alert and oriented to person, place, and time.  Psychiatric:        Mood and Affect: Mood normal.      Labs on Admission: I have personally reviewed following labs and imaging studies  CBC: Recent Labs  Lab 02/25/24 2156  WBC 22.4*  NEUTROABS 19.6*  HGB 10.7*  HCT 33.8*  MCV 97.7  PLT 375   Basic Metabolic Panel: Recent Labs  Lab 02/20/24 0419 02/25/24 2156  NA 137 141  K 3.8 5.3*  CL 106 107  CO2 20* 18*  GLUCOSE 108* 146*  BUN 45* 50*  CREATININE 2.76* 3.35*  CALCIUM 8.1* 9.0   GFR: CrCl cannot be calculated (Unknown ideal weight.). Liver Function Tests: Recent Labs  Lab 02/25/24 2156  AST 21  ALT 19  ALKPHOS 45  BILITOT 0.9  PROT 7.2  ALBUMIN 2.8*   No results for input(s): "LIPASE", "AMYLASE" in the last 168 hours. No results for input(s): "AMMONIA" in the last 168 hours. Coagulation Profile: No results for input(s): "INR", "PROTIME" in the last 168 hours. Cardiac Enzymes: Recent Labs   Lab 02/25/24 2156 02/25/24 2311  TROPONINIHS 9 10   BNP (last 3 results) Recent Labs    02/14/24 0355  BNP 277.2*   HbA1C: No results for input(s): "HGBA1C" in the last 72 hours. CBG: Recent Labs  Lab 02/26/24 0341  GLUCAP 92   Lipid Profile: No results for input(s): "CHOL", "HDL", "LDLCALC", "TRIG", "CHOLHDL", "LDLDIRECT" in the last 72 hours. Thyroid Function Tests: No results for input(s): "TSH", "T4TOTAL", "FREET4", "T3FREE", "THYROIDAB" in the last 72 hours. Anemia Panel: No results for input(s): "VITAMINB12", "FOLATE", "FERRITIN", "TIBC", "IRON", "RETICCTPCT" in the last 72 hours. Urine analysis:    Component Value Date/Time   COLORURINE YELLOW 02/26/2024 0028   APPEARANCEUR TURBID (A) 02/26/2024 0028   LABSPEC 1.010 02/26/2024 0028   PHURINE 5.0 02/26/2024 0028   GLUCOSEU NEGATIVE 02/26/2024 0028   HGBUR MODERATE (A) 02/26/2024 0028   BILIRUBINUR NEGATIVE 02/26/2024 0028   KETONESUR 5 (A) 02/26/2024 0028   PROTEINUR 100 (A) 02/26/2024 0028   UROBILINOGEN 0.2 05/19/2009 0733   NITRITE NEGATIVE 02/26/2024 0028   LEUKOCYTESUR MODERATE (A) 02/26/2024 0028    Radiological Exams on Admission: I have personally reviewed images CT Renal Stone Study Result Date: 02/25/2024 CLINICAL DATA:  Urolithiasis, symptomatic. EXAM: CT ABDOMEN AND PELVIS WITHOUT CONTRAST TECHNIQUE: Multidetector CT imaging of the abdomen and pelvis was performed following  the standard protocol without IV contrast. RADIATION DOSE REDUCTION: This exam was performed according to the departmental dose-optimization program which includes automated exposure control, adjustment of the mA and/or kV according to patient size and/or use of iterative reconstruction technique. COMPARISON:  01/05/2024. FINDINGS: Lower chest: The heart is normal in size and there is a trace pericardial effusion. Multi-vessel coronary artery calcifications are noted. Atelectasis is present at the lung bases. Hepatobiliary: No focal  liver abnormality is seen. The gallbladder is not seen. No biliary ductal dilatation. Pancreas: Unremarkable. No pancreatic ductal dilatation or surrounding inflammatory changes. Spleen: Normal in size without focal abnormality. Adrenals/Urinary Tract: The adrenal glands are within normal limits. Cysts are noted in the right kidney. There is a hyperdense lesion in the right kidney measuring 1 cm, likely hemorrhagic or proteinaceous cyst. Renal calculi are present bilaterally. There is prominence of the proximal collecting system on the right to the level of the pelvis. No obstructing stone is seen bilaterally. The bladder is within normal limits. Stomach/Bowel: Small hiatal hernia is noted. Stomach is within normal limits. Appendix appears normal. No bowel obstruction, free air, or pneumatosis is seen. A moderate amount of stool is noted in the rectum with mild rectal wall thickening and surrounding fat stranding. A stable rim calcified lesion is noted in the central small bowel, possible aneurysm. Vascular/Lymphatic: Aortic atherosclerosis. No enlarged abdominal or pelvic lymph nodes. Reproductive: Prostate is unremarkable. Other: No abdominopelvic ascites. A fat containing umbilical hernia is noted. Musculoskeletal: Degenerative changes are present in the thoracolumbar spine. A stable compression deformity/fracture is noted in the superior endplate at T11. No acute osseous abnormality is seen. IMPRESSION: 1. Bilateral nephrolithiasis. There is a mild prominence of the renal collecting system on the right at the level of the pelvis with no evidence of obstructing stone. 2. Distention of the rectum with colonic wall thickening and mild surrounding fat stranding, possible stercoral colitis. 3. Small hiatal hernia. 4. Stable compression deformity/fracture in the T11 vertebral body. 5. Aortic atherosclerosis and coronary artery calcifications Electronically Signed   By: Thornell Sartorius M.D.   On: 02/25/2024 23:32   DG  Chest 2 View Result Date: 02/25/2024 CLINICAL DATA:  Weakness EXAM: CHEST - 2 VIEW COMPARISON:  02/09/2024 FINDINGS: Stable cardiomediastinal silhouette. Aortic atherosclerotic calcification. No focal consolidation, pleural effusion, or pneumothorax. No displaced rib fractures. Right reverse TSA. IMPRESSION: No active cardiopulmonary disease. Electronically Signed   By: Minerva Fester M.D.   On: 02/25/2024 21:51   CT Head Wo Contrast Result Date: 02/25/2024 CLINICAL DATA:  Mental status change, unknown cause. EXAM: CT HEAD WITHOUT CONTRAST TECHNIQUE: Contiguous axial images were obtained from the base of the skull through the vertex without intravenous contrast. RADIATION DOSE REDUCTION: This exam was performed according to the departmental dose-optimization program which includes automated exposure control, adjustment of the mA and/or kV according to patient size and/or use of iterative reconstruction technique. COMPARISON:  05/17/2009. FINDINGS: Brain: No acute intracranial hemorrhage, midline shift or mass effect is seen. Diffuse atrophy is noted. Periventricular white matter hypodensities are present bilaterally. No hydrocephalus. Vascular: No hyperdense vessel or unexpected calcification. Skull: Normal. Negative for fracture or focal lesion. Sinuses/Orbits: No acute finding. Other: None. IMPRESSION: 1. No acute intracranial process. 2. Atrophy with chronic microvascular ischemic changes. Electronically Signed   By: Thornell Sartorius M.D.   On: 02/25/2024 21:40     EKG: My personal interpretation of EKG shows: Atrial fibrillation heart rate 123.    Assessment/Plan: Principal Problem:   Sepsis secondary  to UTI [facility]) Active Problems:   Acute kidney injury superimposed on chronic kidney disease (HCC)   Pyelonephritis   Acute colitis   Non-insulin dependent type 2 diabetes mellitus (HCC)   Permanent atrial fibrillation (HCC)   Bilateral nonobstructing nephrolithiasis   Hyperkalemia   History of  CVA (cerebrovascular accident)   Age-related physical debility    Assessment and Plan: Sepsis secondary to UTI, pyelonephritis and acute colitis Bilateral nonobstructing nephrolithiasis History of current UTI -Patient presented to the emergency department via family with complaining of worsening generalized weakness unable to perform PT at home and ambulate by himself. - At presentation to ED patient is tachycardic, borderline Hypotensive, tachypneic O2 sat 100% on room air.  Normal lactic acid 1.7.  CBC showing significant leukocytosis 22.4.  CMP showing evidence of hyperkalemia, acidosis and AKI. -UA showed evidence of UTI - Pending urine culture and blood culture result - Chest x-ray unremarkable. - CT abdomen pelvis showed bilateral nephrolithiasis nonobstructing.  Prominence of the right collecting system without any evidence of obstructing stone.  Evidence ofStercoral colitis. -Continue IV ceftriaxone 2 g daily. - Will follow-up with culture result. -Continue to monitor urine output. - Will avoid Foley catheter placement unless patient develop urinary retention. -In the ED patient has been given 1.5 L of NS bolus.  Changing maintenance fluid NS to bicarb drip 125 cc/h.   Acute colitis -CT abdomen pelvis showing evidence of colitis.  Given patient has been recently admitted 5 days ago there is a risk for development of C. difficile infection in the setting of recent hospital admission antibiotic exposure - Checking GI and C. difficile panel. - Continue IV ceftriaxone and metronidazole.  Will follow-up with culture results.  Hyperkalemia -Elevated potassium 5.3 treating with calcium gluconate, insulin 2 unit, and Lokelma 10 g.  Acute kidney injury superimposed CKD stage IIIb -Elevated creatinine 3.35 and GFR 17.  Baseline creatinine around 2-3.  Over the course of last 1 year patient has multiple episodes of AKI.  It is hard to determine if patient has been acute kidney injury  versus this is new baseline renal function and creatinine - Checking urine creatinine and sodium. - Avoid nephrotoxic agent -Continue maintenance fluid. bicarb drip 125 cc/h. -Monitor urine output and monitor renal function.  Anion gap metabolic acidosis - Anion gap metabolic acidosis in the setting of AKI. - Starting bicarb drip on 125 cc/h for 1 day. -Continue to monitor bicarb level.   Non-insulin-dependent DM type II-diet controlled diabetes -History of DM type II diet managed.  Continue check POC blood glucose with meal  Permanent atrial fibrillation -As patient is above 88, creatinine above 1.5 decreasing the dose of Eliquis 5 mg twice daily to 2.5 mg twice daily. -Continue Coreg twice daily   Age-related physical debility -  On discharge need to resume home health for home PT  Essential hypertension Diastolic heart failure preserved EF 55 to 60% -Blood pressure within good range.  Continue Coreg twice daily.  DVT prophylaxis:  Eliquis Code Status:  Full Code Diet: Heart healthy carb modified diet Family Communication: No family member present at bedside now. Disposition Plan: Will follow-up with urine culture and blood culture result for antibiotic guidance.  Pending GI and C. difficile panel. Consults: None at this time as not indicated. Admission status:   Inpatient, cardiac-Telemetry bed  Severity of Illness: The appropriate patient status for this patient is INPATIENT. Inpatient status is judged to be reasonable and necessary in order to provide the required intensity of  service to ensure the patient's safety. The patient's presenting symptoms, physical exam findings, and initial radiographic and laboratory data in the context of their chronic comorbidities is felt to place them at high risk for further clinical deterioration. Furthermore, it is not anticipated that the patient will be medically stable for discharge from the hospital within 2 midnights of admission.    * I certify that at the point of admission it is my clinical judgment that the patient will require inpatient hospital care spanning beyond 2 midnights from the point of admission due to high intensity of service, high risk for further deterioration and high frequency of surveillance required.Marland Kitchen    Tereasa Coop, MD [facility]  How to contact the [facility] Attending or Consulting provider 7A - 7P or covering provider during after hours 7P -7A, for this patient.  Check the care team in [facility] and look for a) attending/consulting [facility] provider listed and b) the [facility] team listed Log into [email or web address] and use [place]'s universal password to access. If you do not have the password, please contact the hospital operator. Locate the [facility] provider you are looking for under [facility] and page to a number that you can be directly reached. If you still have difficulty reaching the provider, please page the [facility] (Director on Call) for the Hospitalists listed on amion for assistance.  02/26/2024, 4:11 AM

## 2024-02-26 NOTE — Plan of Care (Signed)

## 2024-02-26 NOTE — ED Notes (Signed)
 Called lab to see if additional urine tests could be added on. Lab advised they can add them on.

## 2024-02-26 NOTE — ED Provider Notes (Signed)
 I assumed care of this patient from previous provider.  Please see their note for further details of history, exam, and MDM.   Briefly patient is a 89 y.o. male who presented here with generalized malaise and weakness.  Recent admission for urinary tract infection.  Workup notable for leukocytosis.  AKI.  CT scan notable for Stercoral colitis. Pending UA for assess for infection.  UA inconclusive for infection. Will send for culture.  Upon assessment, patient is tachycardic.  Given the leukocytosis of 22,000 with evidence of colitis.  Code sepsis was initiated and patient was started on empiric antibiotics to cover both GI and urinary sources.  Will admit patient to medicine for further workup and management.      Nira Conn, MD 02/26/24 [phone]

## 2024-02-26 NOTE — Sepsis Progress Note (Addendum)
 Elink following for sepsis protocol called at 0150, LA done at 23:12, BC done at 23:10, VSS, Abx's given 0156 and 0200, Flds Given

## 2024-02-27 DIAGNOSIS — N390 Urinary tract infection, site not specified: Secondary | ICD-10-CM | POA: Diagnosis not present

## 2024-02-27 DIAGNOSIS — A419 Sepsis, unspecified organism: Secondary | ICD-10-CM | POA: Diagnosis not present

## 2024-02-27 LAB — URINE CULTURE: Culture: NO GROWTH

## 2024-02-27 LAB — GLUCOSE, CAPILLARY
Glucose-Capillary: 77 mg/dL (ref 70–99)
Glucose-Capillary: 72 mg/dL (ref 70–99)
Glucose-Capillary: 111 mg/dL — ABNORMAL HIGH (ref 70–99)
Glucose-Capillary: 139 mg/dL — ABNORMAL HIGH (ref 70–99)

## 2024-02-27 LAB — GASTROINTESTINAL PANEL BY PCR, STOOL (REPLACES STOOL CULTURE)

## 2024-02-27 LAB — CBC
HCT: 29.4 % — ABNORMAL LOW (ref 39.0–52.0)
Hemoglobin: 8.8 g/dL — ABNORMAL LOW (ref 13.0–17.0)
MCH: 30.4 pg (ref 26.0–34.0)
MCHC: 29.9 g/dL — ABNORMAL LOW (ref 30.0–36.0)
MCV: 101.7 fL — ABNORMAL HIGH (ref 80.0–100.0)
Platelets: 284 10*3/uL (ref 150–400)
RBC: 2.89 MIL/uL — ABNORMAL LOW (ref 4.22–5.81)
RDW: 13.1 % (ref 11.5–15.5)
WBC: 15.1 10*3/uL — ABNORMAL HIGH (ref 4.0–10.5)
nRBC: 0 % (ref 0.0–0.2)

## 2024-02-27 LAB — BASIC METABOLIC PANEL
Anion gap: 17 — ABNORMAL HIGH (ref 5–15)
BUN: 42 mg/dL — ABNORMAL HIGH (ref 8–23)
CO2: 21 mmol/L — ABNORMAL LOW (ref 22–32)
Calcium: 8.1 mg/dL — ABNORMAL LOW (ref 8.9–10.3)
Chloride: 103 mmol/L (ref 98–111)
Creatinine, Ser: 2.65 mg/dL — ABNORMAL HIGH (ref 0.61–1.24)
GFR, Estimated: 22 mL/min — ABNORMAL LOW (ref >60)
Glucose, Bld: 82 mg/dL (ref 70–99)
Potassium: 3.4 mmol/L — ABNORMAL LOW (ref 3.5–5.1)
Sodium: 141 mmol/L (ref 135–145)

## 2024-02-27 MED ORDER — CARMEX CLASSIC LIP BALM EX OINT
TOPICAL_OINTMENT | CUTANEOUS | Status: DC | PRN
  Filled 2024-02-27: qty 10

## 2024-02-27 NOTE — Plan of Care (Signed)
  Problem: Education: Goal: Knowledge of General Education information will improve Description: Including pain rating scale, medication(s)/side effects and non-pharmacologic comfort measures Outcome: Progressing   Problem: Clinical Measurements: Goal: Ability to maintain clinical measurements within normal limits will improve Outcome: Progressing Goal: Diagnostic test results will improve Outcome: Progressing Goal: Respiratory complications will improve Outcome: Progressing Goal: Cardiovascular complication will be avoided Outcome: Progressing   Problem: Nutrition: Goal: Adequate nutrition will be maintained Outcome: Progressing   Problem: Coping: Goal: Level of anxiety will decrease Outcome: Progressing   Problem: Pain Managment: Goal: General experience of comfort will improve and/or be controlled Outcome: Progressing   Problem: Safety: Goal: Ability to remain free from injury will improve Outcome: Progressing   Problem: Health Behavior/Discharge Planning: Goal: Ability to manage health-related needs will improve Outcome: Not Progressing   Problem: Clinical Measurements: Goal: Will remain free from infection Outcome: Not Progressing   Problem: Activity: Goal: Risk for activity intolerance will decrease Outcome: Not Progressing   Problem: Elimination: Goal: Will not experience complications related to bowel motility Outcome: Not Progressing   Problem: Skin Integrity: Goal: Risk for impaired skin integrity will decrease Outcome: Not Progressing

## 2024-02-27 NOTE — TOC Initial Note (Addendum)
 Transition of Care [facility]) - Initial/Assessment Note    Patient Details  Name: Charles Richard MRN: [id number] Date of Birth: Jul 05, 1930  Transition [facility]) CM/SW Contact:    Jessie Foot, RN Phone Number: 02/27/2024, 1:25 PM  Clinical Narrative:                 Presented for sepsis from home.Lives in a single family home with daughter [name]). PCP and [employer] insurance verified. DME ([employer], [employer], [place]). Active with [facility] (PT/OT/Nurse) and will need resumption order.Daughter, [name] will transport home at discharge. TOC will continue to follow for progression to discharge. Pt currently not oriented fully and I have spoken with his daughter regarding dc planning needs.  Expected Discharge Plan: Home w Home Health Services Barriers to Discharge: Continued Medical Work up   Patient Goals and CMS Choice Patient states their goals for this hospitalization and ongoing recovery are:: Return home with daughter and [facility]          Expected Discharge Plan and Services   Discharge Planning Services: CM Consult   Living arrangements for the past 2 months: Single Family Home                 DME Arranged: N/A DME Agency: NA                  Prior Living Arrangements/Services Living arrangements for the past 2 months: Single Family Home Lives with:: Adult Children Patient language and need for interpreter reviewed:: Yes Do you feel safe going back to the place where you live?: Yes      Need for Family Participation in Patient Care: Yes (Comment) Care giver support system in place?: Yes (comment) Current home services: DME, Homehealth aide, Home OT, Home PT, Home RN (Active with [employer]) Criminal Activity/Legal Involvement Pertinent to Current Situation/Hospitalization: No - Comment as needed  Activities of Daily Living   ADL Screening (condition at time of admission) Independently performs ADLs?: Yes (appropriate for developmental age) Is the patient deaf or have difficulty  hearing?: Yes Does the patient have difficulty seeing, even when wearing glasses/contacts?: Yes Does the patient have difficulty concentrating, remembering, or making decisions?: Yes  Permission Sought/Granted Permission sought to share information with : Case Manager Permission granted to share information with : Yes, Verbal Permission Granted  Share Information with NAME: [name]  Permission granted to share info w AGENCY: [facility]  Permission granted to share info w Relationship: daughter  Permission granted to share info w Contact Information: [phone]  Emotional Assessment Appearance:: Appears stated age Attitude/Demeanor/Rapport: Apprehensive Affect (typically observed): Apprehensive Orientation: : Oriented to Self Alcohol / Substance Use: Not Applicable Psych Involvement: No (comment)  Admission diagnosis:  AKI (acute kidney injury) (HCC) [N17.9] Sepsis secondary to UTI (HCC) [A41.9, N39.0] Sepsis with acute renal failure without septic shock, due to unspecified organism, unspecified acute renal failure type (HCC) [A41.9, R65.20, N17.9] Stercoral colitis [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] Patient Active Problem List   Diagnosis Date Noted   Pyelonephritis 02/26/2024   Acute colitis 02/26/2024   History of CVA (cerebrovascular accident) 02/26/2024   Age-related physical debility 02/26/2024   Acute hyperkalemia 02/20/2024   Severe sepsis (HCC) 02/20/2024   Complicated urinary tract infection 02/10/2024   Metabolic acidosis 02/10/2024   Acute kidney injury superimposed on chronic kidney disease (HCC) 01/05/2024   Bilateral nonobstructing nephrolithiasis 01/05/2024   Hyperkalemia 01/05/2024   Protein-calorie malnutrition, severe (HCC) 01/05/2024   Coronary arteriosclerosis 01/05/2024   Pressure ulcer of left heel, unspecified stage  01/05/2024   Class 1 obesity 01/05/2024   Hydroureter on left 01/05/2024   Acute UTI (urinary tract infection) 01/05/2024   Sepsis secondary to UTI (HCC) 01/05/2024    Primary open angle glaucoma (POAG) of both eyes, mild stage 01/23/2023   Hyperlipidemia 09/27/2022   Diabetic neuropathy (HCC) 09/27/2022   Stage 4 chronic kidney disease (HCC) 09/27/2022   Wide-complex tachycardia 07/31/2021   Permanent atrial fibrillation (HCC) 07/30/2021   Closed T11 fracture (HCC) 07/30/2021   Dehydration 07/30/2021   Pneumonia due to COVID-19 virus 05/06/2020   COVID-19 virus infection 05/05/2020   Closed fracture of right proximal humerus 07/20/2017   S/P reverse total shoulder arthroplasty, 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   AAA (abdominal aortic aneurysm) without rupture (HCC) 06/03/2017   Preop cardiovascular exam 06/01/2017   Physical deconditioning    Ureteral stone with hydronephrosis 05/09/2017   Essential hypertension 05/09/2017   Non-insulin dependent type 2 diabetes mellitus (HCC) 05/09/2017   Hyponatremia 05/09/2017   Acute renal failure superimposed on stage 3a chronic kidney disease (HCC) 05/09/2017   Atrial fibrillation with RVR (HCC) 05/09/2017   Sepsis (HCC) 05/09/2017   PCP:  Clinic, [facility] Pharmacy:   [id number] [place], [place] - [address] AT [facility] OF [facility] [address] [place] [address] Phone: [phone] Fax: [phone]  [id number] - [place], [place] - [address] [place] [address] Phone: [phone] Fax: [phone]  [id number] - [place], [place] - [address] AT [facility] OF [address] [place] [address] Phone: [phone] Fax: [phone]     Social Drivers of Health (SDOH) Social History: SDOH Screenings   Food Insecurity: No Food Insecurity (02/27/2024)  Housing: Patient Declined (02/27/2024)  Transportation Needs: No Transportation Needs (02/27/2024)  Utilities: Not At Risk (02/27/2024)  Social Connections: Patient Unable To Answer (02/27/2024)  Tobacco Use: Low Risk   (02/26/2024)   SDOH Interventions:     Readmission Risk Interventions    02/10/2024   12:16 PM 01/07/2024   10:49 AM  Readmission Risk Prevention Plan  Transportation Screening Complete Complete  PCP or Specialist Appt within 3-5 Days Complete   HRI or Home Care Consult Complete Complete  Social Work Consult for Recovery Care Planning/Counseling Complete Complete  Palliative Care Screening Not Applicable Not Applicable  Medication Review Oceanographer) Complete Complete

## 2024-02-27 NOTE — Progress Notes (Signed)
 PROGRESS NOTE    Charles Richard  QIH:[id number] DOB: 02-29-1930 DOA: 02/25/2024 PCP: [facility]  Chief Complaint  Patient presents with   Weakness    Patient brought in by [facility] per EMS family called due to patient weakness. Patient has not be able to stand without assistance which is not baseline for him. Family reported patient can't even get up off the toilet on his own. This is been going on for about 2-3 days. Patient recently seen for UTI hx kidney infections. Medic vitals  BS 140 Bp 159/89 HR120 afib 98% 2L [place] per medics due to patient 02 sats at 89-90% on room air.     Brief Narrative:   89 y.o. male with medical history significant of dementia, AAA, permanent atrial fibrillation on Eliquis, DM type II, essential hypertension, impaired hearing, CVA, hyperlipidemia, CKD stage IIIb and history of recurrent UTI status post cystoscopy left-sided stent placement then removal on 02/19/2024 presented to emergency department with complaining of generalized weakness.   Assessment & Plan:   Principal Problem:   Sepsis secondary to UTI [facility]) Active Problems:   Acute kidney injury superimposed on chronic kidney disease (HCC)   Pyelonephritis   Acute colitis   Non-insulin dependent type 2 diabetes mellitus (HCC)   Permanent atrial fibrillation (HCC)   Bilateral nonobstructing nephrolithiasis   Hyperkalemia   History of CVA (cerebrovascular accident)   Age-related physical debility  Sepsis due to Colitis Norovirus Infection Ruled in for sepsis with leukocytosis, tachycardia, norovirus infection There was concern for pyelonephritis on presentation (notably with recent stent removal on 3/11), but urine culture is negative CT showed distension of rectum with colonic wall thickening and mild fat stranding (? Stercoral colitis) Blood cultures pending Notably, stool studies notable for norovirus today Will continue ceftriaxone/flagyl for now -> not sure I'd expect  norovirus to cause such as significant leukocytosis at presentation He'll need good bowel regimen once his stools slow down given evidence of possible stercoral colitis  AKI on CKD IV AGMA Unclear baseline creatinine - creatinine is currently better than it's been in the recent past few months  Recent Cystoscopy with L Ureteroscopy, laser lithostripsy, and L ureteral stent exchange 3/10 Stent removed on 3/12  Hypertension Coreg  Hyperkalemia Improved, trend  T2DM A1c 12/2023 6.5 Diet controlled, will monitor BG's on BMP's  "Small bowel" calcification noted on CT imaging Previous hospitalist discussed with radiology, likely calcified artery of the mesentery or renal arteries. No management needed.  Aortic Atherosclerosis    DVT prophylaxis: eliquis Code Status: full Family Communication: none Disposition:   Status is: Inpatient Remains inpatient appropriate because: need for continued inpatient care, monitoring renal function   Consultants:  none  Procedures:  none  Antimicrobials:  Anti-infectives (From admission, onward)    Start     Dose/Rate Route Frequency Ordered Stop   02/27/24 0000  cefTRIAXone (ROCEPHIN) 2 g in sodium chloride 0.9 % 100 mL IVPB        2 g 200 mL/hr over 30 Minutes Intravenous Every 24 hours 02/26/24 0236 03/03/24 2359   02/26/24 0800  metroNIDAZOLE (FLAGYL) IVPB 500 mg        500 mg 100 mL/hr over 60 Minutes Intravenous Every 12 hours 02/26/24 0236 03/03/24 1959   02/26/24 0115  cefTRIAXone (ROCEPHIN) 2 g in sodium chloride 0.9 % 100 mL IVPB       Placed in "And" Linked Group   2 g 200 mL/hr over 30 Minutes Intravenous  Once 02/26/24  0106 02/26/24 0346   02/26/24 0115  metroNIDAZOLE (FLAGYL) IVPB 500 mg       Placed in "And" Linked Group   500 mg 100 mL/hr over 60 Minutes Intravenous  Once 02/26/24 0106 02/26/24 0346       Subjective: No complaints  Objective: Vitals:   02/26/24 2143 02/27/24 0358 02/27/24 0811 02/27/24 1158   BP: 133/64 108/71 108/71 (!) 140/75  Pulse: 98 95 95 86  Resp: 18 17 17    Temp: 97.6 F (36.4 C) 98.6 F (37 C) 98.6 F (37 C) 97.7 F (36.5 C)  TempSrc:  Oral Oral   SpO2: 98% 97%  98%  Weight:   103.5 kg   Height:   6' (1.829 m)     Intake/Output Summary (Last 24 hours) at 02/27/2024 1839 Last data filed at 02/27/2024 1700 Gross per 24 hour  Intake 1370 ml  Output --  Net 1370 ml   Filed Weights   02/27/24 0811  Weight: 103.5 kg    Examination:  General exam: chronically ill appearing Respiratory system: unlabored Cardiovascular system: RRR Gastrointestinal system: Abdomen is nondistended, soft and nontender.  Central nervous system: hard of hearing Extremities: no LEE    Data Reviewed: I have personally reviewed following labs and imaging studies  CBC: Recent Labs  Lab 02/25/24 2156 02/26/24 0726 02/27/24 0604  WBC 22.4* 18.6* 15.1*  NEUTROABS 19.6*  --   --   HGB 10.7* 9.5* 8.8*  HCT 33.8* 31.9* 29.4*  MCV 97.7 101.9* 101.7*  PLT 375 321 284    Basic Metabolic Panel: Recent Labs  Lab 02/25/24 2156 02/26/24 0726 02/27/24 0604  NA 141 142 141  K 5.3* 4.5 3.4*  CL 107 110 103  CO2 18* 18* 21*  GLUCOSE 146* 105* 82  BUN 50* 47* 42*  CREATININE 3.35* 2.99* 2.65*  CALCIUM 9.0 8.5* 8.1*    GFR: Estimated Creatinine Clearance: 22.6 mL/min (Rosalynn Sergent) (by C-G formula based on SCr of 2.65 mg/dL (H)).  Liver Function Tests: Recent Labs  Lab 02/25/24 2156 02/26/24 0726  AST 21 16  ALT 19 15  ALKPHOS 45 36*  BILITOT 0.9 1.0  PROT 7.2 6.1*  ALBUMIN 2.8* 2.2*    CBG: Recent Labs  Lab 02/26/24 0341 02/26/24 2041 02/27/24 0733 02/27/24 1130 02/27/24 1652  GLUCAP 92 95 77 72 111*     Recent Results (from the past 240 hours)  Surgical PCR screen     Status: None   Collection Time: 02/18/24  8:42 AM   Specimen: Nasal Mucosa; Nasal Swab  Result Value Ref Range Status   MRSA, PCR NEGATIVE NEGATIVE Final   Staphylococcus aureus NEGATIVE  NEGATIVE Final    Comment: (NOTE) The Xpert SA Assay (FDA approved for NASAL specimens in patients 81 years of age and older), is one component of Addam Goeller comprehensive surveillance program. It is not intended to diagnose infection nor to guide or monitor treatment. Performed at [facility], [address] [place], [place] [address]   Resp panel by RT-PCR (RSV, Flu Dore Oquin&B, Covid) Anterior Nasal Swab     Status: None   Collection Time: 02/25/24  9:29 PM   Specimen: Anterior Nasal Swab  Result Value Ref Range Status   SARS Coronavirus 2 by RT PCR NEGATIVE NEGATIVE Final    Comment: (NOTE) SARS-CoV-2 target nucleic acids are NOT DETECTED.  The SARS-CoV-2 RNA is generally detectable in upper respiratory specimens during the acute phase of infection. The lowest concentration of SARS-CoV-2 viral  copies this assay can detect is 138 copies/mL. Bessie Livingood negative result does not preclude SARS-Cov-2 infection and should not be used as the sole basis for treatment or other patient management decisions. Karinna Beadles negative result may occur with  improper specimen collection/handling, submission of specimen other than nasopharyngeal swab, presence of viral mutation(s) within the areas targeted by this assay, and inadequate number of viral copies(<138 copies/mL). Joshau Code negative result must be combined with clinical observations, patient history, and epidemiological information. The expected result is Negative.  Fact Sheet for Patients:  BloggerCourse.com  Fact Sheet for Healthcare Providers:  SeriousBroker.it  This test is no t yet approved or cleared by the [place] FDA and  has been authorized for detection and/or diagnosis of SARS-CoV-2 by FDA under an Emergency Use Authorization (EUA). This EUA will remain  in effect (meaning this test can be used) for the duration of the COVID-19 declaration under Section 564(b)(1) of the Act,  21 U.S.C.section 360bbb-3(b)(1), unless the authorization is terminated  or revoked sooner.       Influenza Iva Montelongo by PCR NEGATIVE NEGATIVE Final   Influenza B by PCR NEGATIVE NEGATIVE Final    Comment: (NOTE) The Xpert Xpress SARS-CoV-2/FLU/RSV plus assay is intended as an aid in the diagnosis of influenza from Nasopharyngeal swab specimens and should not be used as Madai Nuccio sole basis for treatment. Nasal washings and aspirates are unacceptable for Xpert Xpress SARS-CoV-2/FLU/RSV testing.  Fact Sheet for Patients: BloggerCourse.com  Fact Sheet for Healthcare Providers: SeriousBroker.it  This test is not yet approved or cleared by the [place] FDA and has been authorized for detection and/or diagnosis of SARS-CoV-2 by FDA under an Emergency Use Authorization (EUA). This EUA will remain in effect (meaning this test can be used) for the duration of the COVID-19 declaration under Section 564(b)(1) of the Act, 21 U.S.C. section 360bbb-3(b)(1), unless the authorization is terminated or revoked.     Resp Syncytial Virus by PCR NEGATIVE NEGATIVE Final    Comment: (NOTE) Fact Sheet for Patients: BloggerCourse.com  Fact Sheet for Healthcare Providers: SeriousBroker.it  This test is not yet approved or cleared by the [place] FDA and has been authorized for detection and/or diagnosis of SARS-CoV-2 by FDA under an Emergency Use Authorization (EUA). This EUA will remain in effect (meaning this test can be used) for the duration of the COVID-19 declaration under Section 564(b)(1) of the Act, 21 U.S.C. section 360bbb-3(b)(1), unless the authorization is terminated or revoked.  Performed at [facility], [address], [place], [place] [address]   Culture, blood (routine x 2)     Status: None (Preliminary result)   Collection Time: 02/25/24 11:10 PM   Specimen:  BLOOD  Result Value Ref Range Status   Specimen Description   Final    BLOOD RIGHT ANTECUBITAL Performed at [facility], [address] [place], [place] [address]    Special Requests   Final    BOTTLES DRAWN AEROBIC AND ANAEROBIC Blood Culture results may not be optimal due to an inadequate volume of blood received in culture bottles Performed at [facility], [address], [place], [place] [address]    Culture   Final    NO GROWTH 1 DAY Performed at [facility], [address], [place], [place] [address]    Report Status PENDING  Incomplete  Urine Culture     Status: None   Collection Time: 02/26/24 12:28 AM   Specimen: Urine, Random  Result Value  Ref Range Status   Specimen Description   Final    URINE, RANDOM Performed at [facility], [address], [place], [place] [address]    Special Requests   Final    NONE Reflexed from [id number] Performed at [facility], [address] [place], [place] [address]    Culture   Final    NO GROWTH Performed at [facility], [address], [place], [place] [address]    Report Status 02/27/2024 FINAL  Final  Culture, blood (routine x 2)     Status: None (Preliminary result)   Collection Time: 02/26/24  3:18 PM   Specimen: BLOOD LEFT HAND  Result Value Ref Range Status   Specimen Description   Final    BLOOD LEFT HAND Performed at [facility], [address], [place], [place] [address]    Special Requests   Final    BOTTLES DRAWN AEROBIC ONLY Blood Culture results may not be optimal due to an inadequate volume of blood received in culture bottles Performed at [facility], [address] [place], [place] [address]    Culture   Final    NO GROWTH < 12 HOURS Performed at [facility], [address] [place], [place] [address]    Report Status PENDING  Incomplete  Gastrointestinal Panel by PCR , Stool      Status: Abnormal   Collection Time: 02/26/24  5:17 PM   Specimen: Stool  Result Value Ref Range Status   Campylobacter species NOT DETECTED NOT DETECTED Final   Plesimonas shigelloides NOT DETECTED NOT DETECTED Final   Salmonella species NOT DETECTED NOT DETECTED Final   Yersinia enterocolitica NOT DETECTED NOT DETECTED Final   Vibrio species NOT DETECTED NOT DETECTED Final   Vibrio cholerae NOT DETECTED NOT DETECTED Final   Enteroaggregative E coli (EAEC) NOT DETECTED NOT DETECTED Final   Enteropathogenic E coli (EPEC) NOT DETECTED NOT DETECTED Final   Enterotoxigenic E coli (ETEC) NOT DETECTED NOT DETECTED Final   Shiga like toxin producing E coli (STEC) NOT DETECTED NOT DETECTED Final   Shigella/Enteroinvasive E coli (EIEC) NOT DETECTED NOT DETECTED Final   Cryptosporidium NOT DETECTED NOT DETECTED Final   Cyclospora cayetanensis NOT DETECTED NOT DETECTED Final   Entamoeba histolytica NOT DETECTED NOT DETECTED Final   Giardia lamblia NOT DETECTED NOT DETECTED Final   Adenovirus F40/41 NOT DETECTED NOT DETECTED Final   Astrovirus NOT DETECTED NOT DETECTED Final   Norovirus GI/GII DETECTED (Reather Steller) NOT DETECTED Final    Comment: RESULT CALLED TO, READ BACK BY AND VERIFIED WITH: LINDSAY MORGAN 1634 02/27/24 MU    Rotavirus Chip Canepa NOT DETECTED NOT DETECTED Final   Sapovirus (I, II, IV, and V) NOT DETECTED NOT DETECTED Final    Comment: Performed at [facility], [address] [place], [place] [address]  C Difficile Quick Screen w PCR reflex     Status: None   Collection Time: 02/26/24  5:17 PM   Specimen: Stool  Result Value Ref Range Status   C Diff antigen NEGATIVE NEGATIVE Final   C Diff toxin NEGATIVE NEGATIVE Final   C Diff interpretation No C. difficile detected.  Final    Comment: Performed at [facility], [address], [place], [place] [address]         Radiology Studies: CT Renal Stone Study Result Date: 02/25/2024 CLINICAL DATA:   Urolithiasis, symptomatic. EXAM: CT ABDOMEN AND PELVIS WITHOUT CONTRAST  TECHNIQUE: Multidetector CT imaging of the abdomen and pelvis was performed following the standard protocol without IV contrast. RADIATION DOSE REDUCTION: This exam was performed according to the departmental dose-optimization program which includes automated exposure control, adjustment of the mA and/or kV according to patient size and/or use of iterative reconstruction technique. COMPARISON:  01/05/2024. FINDINGS: Lower chest: The heart is normal in size and there is Onalee Steinbach trace pericardial effusion. Multi-vessel coronary artery calcifications are noted. Atelectasis is present at the lung bases. Hepatobiliary: No focal liver abnormality is seen. The gallbladder is not seen. No biliary ductal dilatation. Pancreas: Unremarkable. No pancreatic ductal dilatation or surrounding inflammatory changes. Spleen: Normal in size without focal abnormality. Adrenals/Urinary Tract: The adrenal glands are within normal limits. Cysts are noted in the right kidney. There is Darden Flemister hyperdense lesion in the right kidney measuring 1 cm, likely hemorrhagic or proteinaceous cyst. Renal calculi are present bilaterally. There is prominence of the proximal collecting system on the right to the level of the pelvis. No obstructing stone is seen bilaterally. The bladder is within normal limits. Stomach/Bowel: Small hiatal hernia is noted. Stomach is within normal limits. Appendix appears normal. No bowel obstruction, free air, or pneumatosis is seen. Dwyne Hasegawa moderate amount of stool is noted in the rectum with mild rectal wall thickening and surrounding fat stranding. Nikoloz Huy stable rim calcified lesion is noted in the central small bowel, possible aneurysm. Vascular/Lymphatic: Aortic atherosclerosis. No enlarged abdominal or pelvic lymph nodes. Reproductive: Prostate is unremarkable. Other: No abdominopelvic ascites. Blaze Nylund fat containing umbilical hernia is noted. Musculoskeletal: Degenerative  changes are present in the thoracolumbar spine. Avir Deruiter stable compression deformity/fracture is noted in the superior endplate at T11. No acute osseous abnormality is seen. IMPRESSION: 1. Bilateral nephrolithiasis. There is Riaan Toledo mild prominence of the renal collecting system on the right at the level of the pelvis with no evidence of obstructing stone. 2. Distention of the rectum with colonic wall thickening and mild surrounding fat stranding, possible stercoral colitis. 3. Small hiatal hernia. 4. Stable compression deformity/fracture in the T11 vertebral body. 5. Aortic atherosclerosis and coronary artery calcifications Electronically Signed   By: Thornell Sartorius M.D.   On: 02/25/2024 23:32   DG Chest 2 View Result Date: 02/25/2024 CLINICAL DATA:  Weakness EXAM: CHEST - 2 VIEW COMPARISON:  02/09/2024 FINDINGS: Stable cardiomediastinal silhouette. Aortic atherosclerotic calcification. No focal consolidation, pleural effusion, or pneumothorax. No displaced rib fractures. Right reverse TSA. IMPRESSION: No active cardiopulmonary disease. Electronically Signed   By: Minerva Fester M.D.   On: 02/25/2024 21:51   CT Head Wo Contrast Result Date: 02/25/2024 CLINICAL DATA:  Mental status change, unknown cause. EXAM: CT HEAD WITHOUT CONTRAST TECHNIQUE: Contiguous axial images were obtained from the base of the skull through the vertex without intravenous contrast. RADIATION DOSE REDUCTION: This exam was performed according to the departmental dose-optimization program which includes automated exposure control, adjustment of the mA and/or kV according to patient size and/or use of iterative reconstruction technique. COMPARISON:  05/17/2009. FINDINGS: Brain: No acute intracranial hemorrhage, midline shift or mass effect is seen. Diffuse atrophy is noted. Periventricular white matter hypodensities are present bilaterally. No hydrocephalus. Vascular: No hyperdense vessel or unexpected calcification. Skull: Normal. Negative for  fracture or focal lesion. Sinuses/Orbits: No acute finding. Other: None. IMPRESSION: 1. No acute intracranial process. 2. Atrophy with chronic microvascular ischemic changes. Electronically Signed   By: Thornell Sartorius M.D.   On: 02/25/2024 21:40        Scheduled Meds:  apixaban  2.5 mg  Oral BID   carvedilol  6.25 mg Oral BID WC   gabapentin  300 mg Oral QHS   simvastatin  10 mg Oral QPM   sodium chloride flush  3 mL Intravenous Q12H   Continuous Infusions:  cefTRIAXone (ROCEPHIN)  IV 2 g (02/27/24 0031)   metronidazole 500 mg (02/27/24 0904)     LOS: 1 day    Time spent: over 30 min    Lacretia Nicks, MD [facility]   To contact the attending provider between 7A-7P or the covering provider during after hours 7P-7A, please log into the web site [email or web address] and access using universal [place] password for that web site. If you do not have the password, please call the hospital operator.  02/27/2024, 6:39 PM

## 2024-02-27 NOTE — Plan of Care (Signed)

## 2024-02-28 DIAGNOSIS — A419 Sepsis, unspecified organism: Secondary | ICD-10-CM | POA: Diagnosis not present

## 2024-02-28 DIAGNOSIS — N390 Urinary tract infection, site not specified: Secondary | ICD-10-CM | POA: Diagnosis not present

## 2024-02-28 LAB — CBC WITH DIFFERENTIAL/PLATELET
Abs Immature Granulocytes: 0.09 10*3/uL — ABNORMAL HIGH (ref 0.00–0.07)
Basophils Absolute: 0.1 10*3/uL (ref 0.0–0.1)
Basophils Relative: 1 %
Eosinophils Absolute: 0.4 10*3/uL (ref 0.0–0.5)
Eosinophils Relative: 3 %
HCT: 31.7 % — ABNORMAL LOW (ref 39.0–52.0)
Hemoglobin: 9.6 g/dL — ABNORMAL LOW (ref 13.0–17.0)
Immature Granulocytes: 1 %
Lymphocytes Relative: 13 %
Lymphs Abs: 1.4 10*3/uL (ref 0.7–4.0)
MCH: 30.5 pg (ref 26.0–34.0)
MCHC: 30.3 g/dL (ref 30.0–36.0)
MCV: 100.6 fL — ABNORMAL HIGH (ref 80.0–100.0)
Monocytes Absolute: 1.1 10*3/uL — ABNORMAL HIGH (ref 0.1–1.0)
Monocytes Relative: 10 %
Neutro Abs: 7.8 10*3/uL — ABNORMAL HIGH (ref 1.7–7.7)
Neutrophils Relative %: 72 %
Platelets: 280 10*3/uL (ref 150–400)
RBC: 3.15 MIL/uL — ABNORMAL LOW (ref 4.22–5.81)
RDW: 13.2 % (ref 11.5–15.5)
WBC: 10.8 10*3/uL — ABNORMAL HIGH (ref 4.0–10.5)
nRBC: 0 % (ref 0.0–0.2)

## 2024-02-28 LAB — COMPREHENSIVE METABOLIC PANEL
ALT: 13 U/L (ref 0–44)
AST: 15 U/L (ref 15–41)
Albumin: 2 g/dL — ABNORMAL LOW (ref 3.5–5.0)
Alkaline Phosphatase: 35 U/L — ABNORMAL LOW (ref 38–126)
Anion gap: 12 (ref 5–15)
BUN: 43 mg/dL — ABNORMAL HIGH (ref 8–23)
CO2: 24 mmol/L (ref 22–32)
Calcium: 8 mg/dL — ABNORMAL LOW (ref 8.9–10.3)
Chloride: 103 mmol/L (ref 98–111)
Creatinine, Ser: 2.42 mg/dL — ABNORMAL HIGH (ref 0.61–1.24)
GFR, Estimated: 25 mL/min — ABNORMAL LOW (ref >60)
Glucose, Bld: 112 mg/dL — ABNORMAL HIGH (ref 70–99)
Potassium: 3 mmol/L — ABNORMAL LOW (ref 3.5–5.1)
Sodium: 139 mmol/L (ref 135–145)
Total Bilirubin: 0.8 mg/dL (ref 0.0–1.2)
Total Protein: 5.7 g/dL — ABNORMAL LOW (ref 6.5–8.1)

## 2024-02-28 LAB — GLUCOSE, CAPILLARY
Glucose-Capillary: 121 mg/dL — ABNORMAL HIGH (ref 70–99)
Glucose-Capillary: 147 mg/dL — ABNORMAL HIGH (ref 70–99)
Glucose-Capillary: 141 mg/dL — ABNORMAL HIGH (ref 70–99)
Glucose-Capillary: 119 mg/dL — ABNORMAL HIGH (ref 70–99)

## 2024-02-28 LAB — PHOSPHORUS: Phosphorus: 2.8 mg/dL (ref 2.5–4.6)

## 2024-02-28 LAB — MAGNESIUM: Magnesium: 1.5 mg/dL — ABNORMAL LOW (ref 1.7–2.4)

## 2024-02-28 MED ORDER — POTASSIUM CHLORIDE CRYS ER 20 MEQ PO TBCR
40.0000 meq | EXTENDED_RELEASE_TABLET | Freq: Once | ORAL | Status: AC
  Administered 2024-02-28: 40 meq via ORAL
  Filled 2024-02-28: qty 2

## 2024-02-28 MED ORDER — MAGNESIUM OXIDE -MG SUPPLEMENT 400 (240 MG) MG PO TABS
400.0000 mg | ORAL_TABLET | Freq: Every day | ORAL | Status: DC
Start: 2024-02-28 — End: 2024-02-29
  Administered 2024-02-28 – 2024-02-29 (×2): 400 mg via ORAL
  Filled 2024-02-28 (×2): qty 1

## 2024-02-28 NOTE — Progress Notes (Signed)
 PROGRESS NOTE    Charles Richard  QMV:[id number] DOB: [birth year] DOA: 02/25/2024 PCP: [facility]  Chief Complaint  Patient presents with   Weakness    Patient brought in by [facility] per EMS family called due to patient weakness. Patient has not be able to stand without assistance which is not baseline for him. Family reported patient can't even get up off the toilet on his own. This is been going on for about 2-3 days. Patient recently seen for UTI hx kidney infections. Medic vitals  BS 140 Bp 159/89 HR120 afib 98% 2L Ida per medics due to patient 02 sats at 89-90% on room air.     Brief Narrative:   89 y.o. male with medical history significant of dementia, AAA, permanent atrial fibrillation on Eliquis, DM type II, essential hypertension, impaired hearing, CVA, hyperlipidemia, CKD stage IIIb and history of recurrent UTI status post cystoscopy left-sided stent placement then removal on 02/19/2024 presented to emergency department with complaining of generalized weakness.   Workup so far shows possible stercoral colitis and norovirus infection.  Working on SNF placement.  Palliative care has been consulted.   Assessment & Plan:   Principal Problem:   Sepsis secondary to UTI [facility]) Active Problems:   Acute kidney injury superimposed on chronic kidney disease (HCC)   Pyelonephritis   Acute colitis   Non-insulin dependent type 2 diabetes mellitus (HCC)   Permanent atrial fibrillation (HCC)   Bilateral nonobstructing nephrolithiasis   Hyperkalemia   History of CVA (cerebrovascular accident)   Age-related physical debility  Sepsis due to Colitis Norovirus Infection Ruled in for sepsis with leukocytosis, tachycardia, norovirus infection There was concern for pyelonephritis on presentation (notably with recent stent removal on 3/11), but urine culture is negative CT showed distension of rectum with colonic wall thickening and mild fat stranding (? Stercoral  colitis) Blood cultures Ngx2 Urine culture negative  Notably, stool studies notable for norovirus  D/c abx today.  Monitor closely off abx (had impressive leukocytosis, more than I'd expect with norovirus alone).   He'll need good bowel regimen once his stools slow down given evidence of possible stercoral colitis  AKI on CKD IV AGMA Unclear baseline creatinine - creatinine is currently better than it's been in the recent past few months Recent CT stone study with mild prominence of renal collecting system on R Seems to be generally improving - will continue to monitor - not clear where things will stabilize  Recent Cystoscopy with L Ureteroscopy, laser lithostripsy, and L ureteral stent exchange 3/10 Stent removed on 3/12 noted  Hypertension Coreg  Hyperkalemia Hypokalemia K 5.3 on 3/17, now Pari Lombard bit low Will supplement cautiously  Hypomagnesemia Replace and follow  T2DM A1c 12/2023 6.5 Diet controlled, will monitor BG's on BMP's  "Small bowel" calcification noted on CT imaging Previous hospitalist discussed with radiology, likely calcified artery of the mesentery or renal arteries. No management needed.  Aortic Atherosclerosis  Goals of Care Daughter notes general decline.  Hopefully this will be temporary in setting of his norovirus infection, but he may have Shakedra Beam prolonged recovery.  Will ask for palliative involvement for goals of care.  Daughter notes he is DNR/I.     DVT prophylaxis: eliquis Code Status: DNR/I, clarified with daughter today Family Communication: none Disposition:   Status is: Inpatient Remains inpatient appropriate because: need for continued inpatient care, monitoring renal function   Consultants:  none  Procedures:  none  Antimicrobials:  Anti-infectives (From admission, onward)  Start     Dose/Rate Route Frequency Ordered Stop   02/27/24 0000  cefTRIAXone (ROCEPHIN) 2 g in sodium chloride 0.9 % 100 mL IVPB  Status:  Discontinued         2 g 200 mL/hr over 30 Minutes Intravenous Every 24 hours 02/26/24 0236 02/28/24 0832   02/26/24 0800  metroNIDAZOLE (FLAGYL) IVPB 500 mg  Status:  Discontinued        500 mg 100 mL/hr over 60 Minutes Intravenous Every 12 hours 02/26/24 0236 02/28/24 0832   02/26/24 0115  cefTRIAXone (ROCEPHIN) 2 g in sodium chloride 0.9 % 100 mL IVPB       Placed in "And" Linked Group   2 g 200 mL/hr over 30 Minutes Intravenous  Once 02/26/24 0106 02/26/24 0346   02/26/24 0115  metroNIDAZOLE (FLAGYL) IVPB 500 mg       Placed in "And" Linked Group   500 mg 100 mL/hr over 60 Minutes Intravenous  Once 02/26/24 0106 02/26/24 0346       Subjective: Asking for help using the bathroom   Objective: Vitals:   02/28/24 0939 02/28/24 0945 02/28/24 1205 02/28/24 1209  BP:    (!) 137/116  Pulse: (!) 135 (!) 112 (!) 140 97  Resp:      Temp:    98.3 F (36.8 C)  TempSrc:    Oral  SpO2:    97%  Weight:      Height:        Intake/Output Summary (Last 24 hours) at 02/28/2024 1511 Last data filed at 02/28/2024 1884 Gross per 24 hour  Intake 150 ml  Output 800 ml  Net -650 ml   Filed Weights   02/27/24 0811  Weight: 103.5 kg    Examination:  General: chronically ill appearing Cardiovascular: RRR Lungs: unlabored Abdomen: Soft, nontender, nondistended Neurological: Alert. Moves all extremities 4 . Cranial nerves II through XII grossly intact. Extremities: No clubbing or cyanosis. No edema.   Data Reviewed: I have personally reviewed following labs and imaging studies  CBC: Recent Labs  Lab 02/25/24 2156 02/26/24 0726 02/27/24 0604 02/28/24 0557  WBC 22.4* 18.6* 15.1* 10.8*  NEUTROABS 19.6*  --   --  7.8*  HGB 10.7* 9.5* 8.8* 9.6*  HCT 33.8* 31.9* 29.4* 31.7*  MCV 97.7 101.9* 101.7* 100.6*  PLT 375 321 284 280    Basic Metabolic Panel: Recent Labs  Lab 02/25/24 2156 02/26/24 0726 02/27/24 0604 02/28/24 0557  NA 141 142 141 139  K 5.3* 4.5 3.4* 3.0*  CL 107 110 103 103   CO2 18* 18* 21* 24  GLUCOSE 146* 105* 82 112*  BUN 50* 47* 42* 43*  CREATININE 3.35* 2.99* 2.65* 2.42*  CALCIUM 9.0 8.5* 8.1* 8.0*  MG  --   --   --  1.5*  PHOS  --   --   --  2.8    GFR: Estimated Creatinine Clearance: 24.7 mL/min (Niomie Englert) (by C-G formula based on SCr of 2.42 mg/dL (H)).  Liver Function Tests: Recent Labs  Lab 02/25/24 2156 02/26/24 0726 02/28/24 0557  AST 21 16 15   ALT 19 15 13   ALKPHOS 45 36* 35*  BILITOT 0.9 1.0 0.8  PROT 7.2 6.1* 5.7*  ALBUMIN 2.8* 2.2* 2.0*    CBG: Recent Labs  Lab 02/27/24 1130 02/27/24 1652 02/27/24 2125 02/28/24 0728 02/28/24 1207  GLUCAP 72 111* 139* 121* 147*     Recent Results (from the past 240 hours)  Resp panel by RT-PCR (RSV,  Flu Zailee Vallely&B, Covid) Anterior Nasal Swab     Status: None   Collection Time: 02/25/24  9:29 PM   Specimen: Anterior Nasal Swab  Result Value Ref Range Status   SARS Coronavirus 2 by RT PCR NEGATIVE NEGATIVE Final    Comment: (NOTE) SARS-CoV-2 target nucleic acids are NOT DETECTED.  The SARS-CoV-2 RNA is generally detectable in upper respiratory specimens during the acute phase of infection. The lowest concentration of SARS-CoV-2 viral copies this assay can detect is 138 copies/mL. Burma Ketcher negative result does not preclude SARS-Cov-2 infection and should not be used as the sole basis for treatment or other patient management decisions. Oden Lindaman negative result may occur with  improper specimen collection/handling, submission of specimen other than nasopharyngeal swab, presence of viral mutation(s) within the areas targeted by this assay, and inadequate number of viral copies(<138 copies/mL). Rohaan Durnil negative result must be combined with clinical observations, patient history, and epidemiological information. The expected result is Negative.  Fact Sheet for Patients:  BloggerCourse.com  Fact Sheet for Healthcare Providers:  SeriousBroker.it  This test is no t  yet approved or cleared by the [place] FDA and  has been authorized for detection and/or diagnosis of SARS-CoV-2 by FDA under an Emergency Use Authorization (EUA). This EUA will remain  in effect (meaning this test can be used) for the duration of the COVID-19 declaration under Section 564(b)(1) of the Act, 21 U.S.C.section 360bbb-3(b)(1), unless the authorization is terminated  or revoked sooner.       Influenza Meher Kucinski by PCR NEGATIVE NEGATIVE Final   Influenza B by PCR NEGATIVE NEGATIVE Final    Comment: (NOTE) The Xpert Xpress SARS-CoV-2/FLU/RSV plus assay is intended as an aid in the diagnosis of influenza from Nasopharyngeal swab specimens and should not be used as Naylee Frankowski sole basis for treatment. Nasal washings and aspirates are unacceptable for Xpert Xpress SARS-CoV-2/FLU/RSV testing.  Fact Sheet for Patients: BloggerCourse.com  Fact Sheet for Healthcare Providers: SeriousBroker.it  This test is not yet approved or cleared by the [place] FDA and has been authorized for detection and/or diagnosis of SARS-CoV-2 by FDA under an Emergency Use Authorization (EUA). This EUA will remain in effect (meaning this test can be used) for the duration of the COVID-19 declaration under Section 564(b)(1) of the Act, 21 U.S.C. section 360bbb-3(b)(1), unless the authorization is terminated or revoked.     Resp Syncytial Virus by PCR NEGATIVE NEGATIVE Final    Comment: (NOTE) Fact Sheet for Patients: BloggerCourse.com  Fact Sheet for Healthcare Providers: SeriousBroker.it  This test is not yet approved or cleared by the [place] FDA and has been authorized for detection and/or diagnosis of SARS-CoV-2 by FDA under an Emergency Use Authorization (EUA). This EUA will remain in effect (meaning this test can be used) for the duration of the COVID-19 declaration under Section 564(b)(1)  of the Act, 21 U.S.C. section 360bbb-3(b)(1), unless the authorization is terminated or revoked.  Performed at [facility], [address], [place], [place] [address]   Culture, blood (routine x 2)     Status: None (Preliminary result)   Collection Time: 02/25/24 11:10 PM   Specimen: BLOOD  Result Value Ref Range Status   Specimen Description   Final    BLOOD RIGHT ANTECUBITAL Performed at [facility], [address] [place], [place] [address]    Special Requests   Final    BOTTLES DRAWN AEROBIC AND ANAEROBIC Blood Culture results may not be optimal due to an inadequate volume  of blood received in culture bottles Performed at [facility], [address] [place], [place] [address]    Culture   Final    NO GROWTH 2 DAYS Performed at [facility], [address] [place], [place] [address]    Report Status PENDING  Incomplete  Urine Culture     Status: None   Collection Time: 02/26/24 12:28 AM   Specimen: Urine, Random  Result Value Ref Range Status   Specimen Description   Final    URINE, RANDOM Performed at [facility], [address] [place], [place] [address]    Special Requests   Final    NONE Reflexed from [id number] Performed at [facility], [address] [place], [place] [address]    Culture   Final    NO GROWTH Performed at [facility], [address] [place], [place] [address]    Report Status 02/27/2024 FINAL  Final  Culture, blood (routine x 2)     Status: None (Preliminary result)   Collection Time: 02/26/24  3:18 PM   Specimen: BLOOD LEFT HAND  Result Value Ref Range Status   Specimen Description   Final    BLOOD LEFT HAND Performed at [facility], [address], [place], [place] [address]    Special Requests   Final    BOTTLES DRAWN AEROBIC ONLY Blood Culture results may not be optimal due to an inadequate volume of blood received in  culture bottles Performed at [facility], [address], [place], [place] [address]    Culture   Final    NO GROWTH 2 DAYS Performed at [facility], [address] [place], [place] [address]    Report Status PENDING  Incomplete  Gastrointestinal Panel by PCR , Stool     Status: Abnormal   Collection Time: 02/26/24  5:17 PM   Specimen: Stool  Result Value Ref Range Status   Campylobacter species NOT DETECTED NOT DETECTED Final   Plesimonas shigelloides NOT DETECTED NOT DETECTED Final   Salmonella species NOT DETECTED NOT DETECTED Final   Yersinia enterocolitica NOT DETECTED NOT DETECTED Final   Vibrio species NOT DETECTED NOT DETECTED Final   Vibrio cholerae NOT DETECTED NOT DETECTED Final   Enteroaggregative E coli (EAEC) NOT DETECTED NOT DETECTED Final   Enteropathogenic E coli (EPEC) NOT DETECTED NOT DETECTED Final   Enterotoxigenic E coli (ETEC) NOT DETECTED NOT DETECTED Final   Shiga like toxin producing E coli (STEC) NOT DETECTED NOT DETECTED Final   Shigella/Enteroinvasive E coli (EIEC) NOT DETECTED NOT DETECTED Final   Cryptosporidium NOT DETECTED NOT DETECTED Final   Cyclospora cayetanensis NOT DETECTED NOT DETECTED Final   Entamoeba histolytica NOT DETECTED NOT DETECTED Final   Giardia lamblia NOT DETECTED NOT DETECTED Final   Adenovirus F40/41 NOT DETECTED NOT DETECTED Final   Astrovirus NOT DETECTED NOT DETECTED Final   Norovirus GI/GII DETECTED (Lee Kalt) NOT DETECTED Final    Comment: RESULT CALLED TO, READ BACK BY AND VERIFIED WITH: LINDSAY MORGAN 1634 02/27/24 MU    Rotavirus Carlotta Telfair NOT DETECTED NOT DETECTED Final   Sapovirus (I, II, IV, and V) NOT DETECTED NOT DETECTED Final    Comment: Performed at [facility], [address] [place], [place] [address]  C Difficile Quick Screen w PCR reflex     Status: None   Collection Time: 02/26/24  5:17 PM   Specimen: Stool  Result Value  Ref Range Status   C Diff antigen NEGATIVE NEGATIVE Final    C Diff toxin NEGATIVE NEGATIVE Final   C Diff interpretation No C. difficile detected.  Final    Comment: Performed at [facility], [address], [place], [place] [address]         Radiology Studies: No results found.       Scheduled Meds:  apixaban  2.5 mg Oral BID   carvedilol  6.25 mg Oral BID WC   gabapentin  300 mg Oral QHS   simvastatin  10 mg Oral QPM   sodium chloride flush  3 mL Intravenous Q12H   Continuous Infusions:     LOS: 2 days    Time spent: over 30 min    Lacretia Nicks, MD [facility]   To contact the attending provider between 7A-7P or the covering provider during after hours 7P-7A, please log into the web site [email or web address] and access using universal [place] password for that web site. If you do not have the password, please call the hospital operator.  02/28/2024, 3:11 PM

## 2024-02-28 NOTE — Progress Notes (Signed)
 Occupational Therapy Evaluation Patient Details Name: Charles Richard MRN: [id number] DOB: [birth year] Today's Date: 02/28/2024   History of Present Illness   89 year old presents to the ED on 02/25/24 after a recent hospitalization on 02/09/24 via due to fever, dysuria, and cough and admitted for urosepsis. Pt returned to [facility] due to  generalized weakness. Admitted with Sepsis secondary to UTI and norovirus.  PMHx: dementia, AAA, permanent A-fib on Eliquis, DM 2, HTN, impaired hearing, CVA, HLD, CKD 3B.  Pt with recent admission 1/25 - 01/09/2024 for obstructive renal stone requiring emergent cystoscopy and left-sided stent placement.  Eventually discharged on 10 days of oral antibiotics and Foley catheter.     Clinical Impressions Patient is currently requiring as high as Total assistance of 2 with basic ADLs, as well as  Max assist with bed mobility and up to Max assist of 2 with functional transfers to BSC/Recliner with use of RW and heart rate of 135..   Current level of function is below patient's typical baseline prior to February.    During this evaluation, patient was limited by severe hearing loss despite hearing aid, generalized weakness, impaired activity tolerance, and baseline cognitive impairment due to dementia, all of which has the potential to impact patient's and/or caregivers' safety and independence during functional mobility, as well as performance for ADLs.    Patient lives with family and has an aide, all of whom would be unable to safely provide the necessary supervision and assistance that pt currently requires.  Patient demonstrates good rehab potential, and should benefit from continued skilled occupational therapy services while in acute care to maximize safety, independence and quality of life at home.  Continued occupational therapy services in acute care are recommended. Patient will benefit from continued inpatient follow up therapy, <3 hours/day.  ?      If plan is  discharge home, recommend the following:   A lot of help with bathing/dressing/bathroom;Assist for transportation;Direct supervision/assist for medications management;Help with stairs or ramp for entrance;A lot of help with walking and/or transfers;Two people to help with walking and/or transfers;Supervision due to cognitive status     Functional Status Assessment   Patient has had a recent decline in their functional status and demonstrates the ability to make significant improvements in function in a reasonable and predictable amount of time.     Equipment Recommendations   Other (comment) (Defer to next LOC)     Recommendations for Other Services         Precautions/Restrictions   Precautions Precautions: Fall Recall of Precautions/Restrictions: Intact Precaution/Restrictions Comments: likes to wear his shoes (grip). Enteric Precautions Restrictions Weight Bearing Restrictions Per Provider Order: No     Mobility Bed Mobility Overal bed mobility: Needs Assistance Bed Mobility: Supine to Sit     Supine to sit: Max assist, HOB elevated, Used rails (Pt very stiff when initiating mobility. Step by step cues needed. Max As to LEs and trunk.)          Transfers                          Balance Overall balance assessment: Needs assistance Sitting-balance support: Feet supported, Single extremity supported Sitting balance-Leahy Scale: Fair (quick to fatigue)     Standing balance support: Bilateral upper extremity supported, Reliant on assistive device for balance, During functional activity Standing balance-Leahy Scale: Poor  ADL either performed or assessed with clinical judgement   ADL Overall ADL's : Needs assistance/impaired Eating/Feeding: Set up;Bed level Eating/Feeding Details (indicate cue type and reason): Daughter reports pt is rejecting food but drinking his supplimental drinks well. Grooming:  Wash/dry face;Sitting;Set up;Supervision/safety   Upper Body Bathing: Sitting;Minimal assistance   Lower Body Bathing: Maximal assistance;Bed level Lower Body Bathing Details (indicate cue type and reason): Also see toileting. Upper Body Dressing : Minimal assistance;Sitting;Cueing for sequencing   Lower Body Dressing: Total assistance;Sitting/lateral leans   Toilet Transfer: Moderate assistance;Maximal assistance;Rolling walker (2 wheels);Cueing for sequencing;Stand-pivot;Cueing for safety;+2 for physical assistance Toilet Transfer Details (indicate cue type and reason): Pt stood from EOB to RW with cues, and Mod Assist of 2 people x 2 reps due to uncontrolled bowel voiding. Pt used RW to pivot to recliner with Mod then Max As of 2 people and required MAx As of two to safely descend to chair. Pt stood x 2 from recliner due to continued bowel and hygiene needs, requiring Mod-Max As of 2 with each sit<>stand.  Unable to hear cues for UE reach back for more controlled eccentric movement. Toileting- Clothing Manipulation and Hygiene: Total assistance;+2 for physical assistance;Sit to/from stand Toileting - Clothing Manipulation Details (indicate cue type and reason): Total Assist for hygiene needs. RN notified no BSC in room and pt will require one.     Functional mobility during ADLs: Moderate assistance;Rolling walker (2 wheels);Cueing for safety;Maximal assistance;Cueing for sequencing;+2 for physical assistance       Vision Baseline Vision/History: 3 Glaucoma Ability to See in Adequate Light: 0 Adequate Patient Visual Report: No change from baseline       Perception         Praxis         Pertinent Vitals/Pain Pain Assessment Pain Assessment: No/denies pain (Pt denied pain at rest and with activity. Pt moans but says it is effort.) Pain Intervention(s): Monitored during session     Extremity/Trunk Assessment Upper Extremity Assessment Upper Extremity Assessment: Generalized  weakness   Lower Extremity Assessment Lower Extremity Assessment: Defer to PT evaluation   Cervical / Trunk Assessment Cervical / Trunk Assessment: Kyphotic   Communication Communication Communication: Impaired Factors Affecting Communication: Hearing impaired   Cognition Arousal: Alert Behavior During Therapy: WFL for tasks assessed/performed Cognition: History of cognitive impairments Difficult to assess due to: Hard of hearing/deaf           OT - Cognition Comments: pt very pleasant, cooperative.                 Following commands: Intact       Cueing  General Comments   Cueing Techniques: Verbal cues;Gestural cues;Tactile cues;Visual cues      Exercises     Shoulder Instructions      Home Living Family/patient expects to be discharged to:: Private residence Living Arrangements: Children Available Help at Discharge: Family;Available 24 hours/day Type of Home: House Home Access: Ramped entrance     Home Layout: One level     Bathroom Shower/Tub: Producer, television/film/video: Handicapped height Bathroom Accessibility: Yes   Home Equipment: Rollator (4 wheels);BSC/3in1;Transport chair;Rolling Walker (2 wheels);Cane - single point;Shower seat;Hand held shower head;Grab bars - toilet;Grab bars - tub/shower;Hospital bed;Lift chair   Additional Comments: lives with daughter      Prior Functioning/Environment Prior Level of Function : Needs assist  Cognitive Assist : ADLs (cognitive);Mobility (cognitive)     Physical Assist : ADLs (physical);Mobility (physical)  Mobility Comments: uses rollator at baseline, able to get  up to amb without assistance,  has a lift chair ADLs Comments: patient has assist with showers from family 3x/wk and able to participate in UB bathing/LB bathing, able to dress himself, uses rollator at baseline, has a lift chair.  Daughter, [name] reports that since home from recent admission this month, pt has been weak and  requiring increased assistance with eventual inability to stand and need of EMS call.    OT Problem List: Decreased strength;Impaired balance (sitting and/or standing);Decreased safety awareness;Decreased activity tolerance;Decreased coordination;Decreased knowledge of use of DME or AE;Pain;Cardiopulmonary status limiting activity   OT Treatment/Interventions: Self-care/ADL training;DME and/or AE instruction;Balance training;Therapeutic activities;Therapeutic exercise;Patient/family education;Energy conservation      OT Goals(Current goals can be found in the care plan section)   Acute Rehab OT Goals Patient Stated Goal: Per daughter, for pt to go to rehab at [facility] in [place] as it is right near home. OT Goal Formulation: With family Time For Goal Achievement: 03/13/24 Potential to Achieve Goals: Good ADL Goals Pt Will Perform Grooming: standing;with supervision (for at least one full task with VSS) Pt Will Transfer to Toilet: with contact guard assist;ambulating;bedside commode Pt Will Perform Tub/Shower Transfer: with min assist;Shower transfer;with caregiver independent in assisting;Stand pivot transfer;anterior/posterior transfer Pt/caregiver will Perform Home Exercise Program: Increased ROM;Increased strength;Both right and left upper extremity;With Supervision (VSS, RPE no greater than 6/10) Additional ADL Goal #1: Family/CGs will verbalize understnading to compensatory strategies for peri care, bathing, etc to decrease CG burden and increase pt participation in his own care ad lib.   OT Frequency:  Min 1X/week    Co-evaluation PT/OT/SLP Co-Evaluation/Treatment: Yes Reason for Co-Treatment: For patient/therapist safety;To address functional/ADL transfers PT goals addressed during session: Mobility/safety with mobility;Balance OT goals addressed during session: ADL's and self-care      AM-PAC OT "6 Clicks" Daily Activity     Outcome Measure Help from another person  eating meals?: A Little Help from another person taking care of personal grooming?: A Little Help from another person toileting, which includes using toliet, bedpan, or urinal?: Total Help from another person bathing (including washing, rinsing, drying)?: A Lot Help from another person to put on and taking off regular upper body clothing?: A Little Help from another person to put on and taking off regular lower body clothing?: Total 6 Click Score: 13   End of Session Equipment Utilized During Treatment: Gait belt;Rolling walker (2 wheels);Other (comment) Nurse Communication: Other (comment);Mobility status (need of BSC in room)  Activity Tolerance: Patient limited by fatigue (HR to 135) Patient left: with call bell/phone within reach;in chair;with family/visitor present;with chair alarm set  OT Visit Diagnosis: Unsteadiness on feet (R26.81);Other abnormalities of gait and mobility (R26.89);Muscle weakness (generalized) (M62.81)                Time: 3220-2542 OT Time Calculation (min): 30 min Charges:  OT General Charges $OT Visit: 1 Visit OT Evaluation $OT Eval Low Complexity: 1 Low  Victorino Dike, OT Acute Rehab Services Office: [phone] 02/28/2024   Theodoro Clock 02/28/2024, 9:58 AM

## 2024-02-28 NOTE — Plan of Care (Signed)

## 2024-02-28 NOTE — Progress Notes (Signed)
 Occupational Therapy Treatment Patient Details Name: Charles Richard MRN: [id number] DOB: 1930/08/10 Today's Date: 02/28/2024   History of present illness 89 year old presents to the ED on 02/25/24 after a recent hospitalization on 02/09/24 via due to fever, dysuria, and cough and admitted for urosepsis. [facility] due to  generalized weakness. Admitted with Sepsis secondary to UTI and norovirus.  PMHx: dementia, AAA, permanent A-fib on Eliquis, DM 2, HTN, impaired hearing, CVA, HLD, CKD 3B.  Pt with recent admission 1/25 - 01/09/2024 for obstructive renal stone requiring emergent cystoscopy and left-sided stent placement.  Eventually discharged on 10 days of oral antibiotics and Foley catheter.   OT comments  Daughter request that OT return to room to assist pt. See below for details.  Up to Max As of 2 to safely pivot from [facility] to EOB. Mod As of 2 for squat pivot, recliner->BSC. Near fall with pivot from [facility] to EOB due to pt fatigue. Required Max As of 2 lower to bed for safety.   Pt tolerated only 30 min in recliner and very tired after, but does continue to have leaking bowel and likely depleted.  Will continue to follow for OT as pt shows good rehab potential and very motivated.       If plan is discharge home, recommend the following:  A lot of help with bathing/dressing/bathroom;Assist for transportation;Direct supervision/assist for medications management;Help with stairs or ramp for entrance;A lot of help with walking and/or transfers;Two people to help with walking and/or transfers;Supervision due to cognitive status   Equipment Recommendations  Other (comment) (Defer to next LOC)    Recommendations for Other Services      Precautions / Restrictions Precautions Precautions: Fall Recall of Precautions/Restrictions: Intact Precaution/Restrictions Comments: likes to wear his shoes (grip). Enteric Precautions Restrictions Weight Bearing Restrictions Per Provider Order: No        Mobility Bed Mobility Overal bed mobility: Needs Assistance Bed Mobility: Sit to Supine     Supine to sit: Max assist, HOB elevated, Used rails (Pt very stiff when initiating mobility. Step by step cues needed. Max As to LEs and trunk.) Sit to supine: Mod assist (Mod As as pt performed a sort of "controlled fall" from EOB to sidelying. Tiotal Assist of 2 people for supine posterior scoot.)        Transfers                         Balance Overall balance assessment: Needs assistance Sitting-balance support: Feet supported, Single extremity supported Sitting balance-Leahy Scale: Fair (quick to fatigue)     Standing balance support: Bilateral upper extremity supported, Reliant on assistive device for balance, During functional activity Standing balance-Leahy Scale: Poor                             ADL either performed or assessed with clinical judgement   ADL Overall ADL's : Needs assistance/impaired Eating/Feeding: Set up;Bed level Eating/Feeding Details (indicate cue type and reason): Daughter reports pt is rejecting food but drinking his supplimental drinks well. Grooming: Wash/dry face;Sitting;Set up;Supervision/safety   Upper Body Bathing: Sitting;Minimal assistance   Lower Body Bathing: Maximal assistance;Bed level Lower Body Bathing Details (indicate cue type and reason): Also see toileting. Upper Body Dressing : Minimal assistance;Sitting;Cueing for sequencing   Lower Body Dressing: Total assistance;Sitting/lateral leans   Toilet Transfer: Moderate assistance;Squat-pivot;+2 for physical assistance;Maximal assistance Toilet Transfer Details (indicate cue type and  reason): Daughter found OT in hallwy and reported that after 30 min in chair, pt is very fatigued and requesting return to bed.  Pt believes he was still voiding bowels and so BSC setup.  Pt performed squat-pivot to [facility] with Mod As and 2nd person for safety.  Pt stood from [facility] with Mod As  and OT assisted with standing balance while CNA perform hygiene. Pt then pivoted with RW to EOB with LOB and MAx As of 2 lower to EOB for safety. Toileting- Clothing Manipulation and Hygiene: Total assistance;+2 for physical assistance;Sit to/from stand Toileting - Clothing Manipulation Details (indicate cue type and reason): Total Assist for hygiene needs.     Functional mobility during ADLs: Moderate assistance;Rolling walker (2 wheels);Cueing for safety;Maximal assistance;Cueing for sequencing;+2 for physical assistance      Extremity/Trunk Assessment Upper Extremity Assessment Upper Extremity Assessment: Generalized weakness   Lower Extremity Assessment Lower Extremity Assessment: Generalized weakness   Cervical / Trunk Assessment Cervical / Trunk Assessment: Kyphotic    Vision Baseline Vision/History: 3 Glaucoma Ability to See in Adequate Light: 0 Adequate Patient Visual Report: No change from baseline     Perception     Praxis     Communication Communication Communication: Impaired Factors Affecting Communication: Hearing impaired   Cognition Arousal: Alert Behavior During Therapy: WFL for tasks assessed/performed Cognition: History of cognitive impairments Difficult to assess due to: Hard of hearing/deaf           OT - Cognition Comments: pt very pleasant, cooperative.                 Following commands: Intact        Cueing   Cueing Techniques: Verbal cues, Gestural cues, Tactile cues, Visual cues  Exercises Other Exercises Other Exercises: Pt and daughter educated for pt to move UB and LB as tolerated throughout the day. Suggested use of TV commercials (Since TV on) and to do a few UE exercises until fatigued then rest. Next commercial, do a few LE exercises , then rest, etc. Pt and daughter receptive to this.    Shoulder Instructions       General Comments freq urge for BM    Pertinent Vitals/ Pain       Pain Assessment Pain Assessment:  No/denies pain Pain Intervention(s): Monitored during session  Home Living Family/patient expects to be discharged to:: Private residence Living Arrangements: Children Available Help at Discharge: Family;Available 24 hours/day Type of Home: House Home Access: Ramped entrance     Home Layout: One level     Bathroom Shower/Tub: Producer, television/film/video: Handicapped height Bathroom Accessibility: Yes   Home Equipment: Rollator (4 wheels);BSC/3in1;Transport chair;Rolling Walker (2 wheels);Cane - single point;Shower seat;Hand held shower head;Grab bars - toilet;Grab bars - tub/shower;Hospital bed;Lift chair   Additional Comments: lives with daughter      Prior Functioning/Environment              Frequency  Min 1X/week        Progress Toward Goals  OT Goals(current goals can now be found in the care plan section)  Progress towards OT goals: Not progressing toward goals - comment (2nd visit of day after Evaluation. No progress expcted yet)  Acute Rehab OT Goals Patient Stated Goal: Per daughter, for pt to go to rehab at [facility] in [place] as it is right near home. OT Goal Formulation: With family Time For Goal Achievement: 03/13/24 Potential to Achieve Goals: Good ADL Goals Pt Will Perform  Grooming: standing;with supervision (for at least one full task with VSS) Pt Will Transfer to Toilet: with contact guard assist;ambulating;bedside commode Pt Will Perform Tub/Shower Transfer: with min assist;Shower transfer;with caregiver independent in assisting;Stand pivot transfer;anterior/posterior transfer Pt/caregiver will Perform Home Exercise Program: Increased ROM;Increased strength;Both right and left upper extremity;With Supervision (VSS, RPE no greater than 6/10) Additional ADL Goal #1: Family/CGs will verbalize understnading to compensatory strategies for peri care, bathing, etc to decrease CG burden and increase pt participation in his own care ad lib.   Plan      Co-evaluation    PT/OT/SLP Co-Evaluation/Treatment: Yes Reason for Co-Treatment: For patient/therapist safety;To address functional/ADL transfers PT goals addressed during session: Mobility/safety with mobility;Balance OT goals addressed during session: ADL's and self-care      AM-PAC OT "6 Clicks" Daily Activity     Outcome Measure   Help from another person eating meals?: A Little Help from another person taking care of personal grooming?: A Little Help from another person toileting, which includes using toliet, bedpan, or urinal?: Total Help from another person bathing (including washing, rinsing, drying)?: A Lot Help from another person to put on and taking off regular upper body clothing?: A Little Help from another person to put on and taking off regular lower body clothing?: Total 6 Click Score: 13    End of Session Equipment Utilized During Treatment: Gait belt;Rolling walker (2 wheels);Other (comment)  OT Visit Diagnosis: Unsteadiness on feet (R26.81);Other abnormalities of gait and mobility (R26.89);Muscle weakness (generalized) (M62.81)   Activity Tolerance Patient limited by fatigue   Patient Left with family/visitor present;in bed;with call bell/phone within reach;with bed alarm set;with nursing/sitter in room   Nurse Communication Other (comment) (Rn to room to notifty OT of tele report of 140 HR. this resolved once pt resting in bed.)        Time: 1610-9604 OT Time Calculation (min): 13 min  Charges: OT General Charges $OT Visit: 1 Visit OT Evaluation $OT Eval Low Complexity: 1 Low OT Treatments $Therapeutic Activity: 8-22 mins  Victorino Dike, OT Acute Rehab Services Office: [phone] 02/28/2024   Theodoro Clock 02/28/2024, 12:13 PM

## 2024-02-28 NOTE — [place] FL2 (Signed)
 [place] MEDICAID FL2 LEVEL OF CARE FORM     IDENTIFICATION  Patient Name: Charles Richard Birthdate: 1930-07-02 Sex: male Admission Date (Current Location): 02/25/2024  [facility] and [place] Number:  Producer, television/film/video and Address:  [facility],  [address] [place], [place] [address]      Provider Number: [id number]  Attending Physician Name and Address:  Zigmund Daniel., *  Relative Name and Phone Number:  [name], [name] (Daughter)  [phone]    Current Level of Care: Hospital Recommended Level of Care: Skilled Nursing Facility Prior Approval Number:    Date Approved/Denied:   PASRR Number: [id number]  Discharge Plan: SNF    Current Diagnoses: Patient Active Problem List   Diagnosis Date Noted   Pyelonephritis 02/26/2024   Acute colitis 02/26/2024   History of CVA (cerebrovascular accident) 02/26/2024   Age-related physical debility 02/26/2024   Acute hyperkalemia 02/20/2024   Severe sepsis (HCC) 02/20/2024   Complicated urinary tract infection 02/10/2024   Metabolic acidosis 02/10/2024   Acute kidney injury superimposed on chronic kidney disease (HCC) 01/05/2024   Bilateral nonobstructing nephrolithiasis 01/05/2024   Hyperkalemia 01/05/2024   Protein-calorie malnutrition, severe (HCC) 01/05/2024   Coronary arteriosclerosis 01/05/2024   Pressure ulcer of left heel, unspecified stage 01/05/2024   Class 1 obesity 01/05/2024   Hydroureter on left 01/05/2024   Acute UTI (urinary tract infection) 01/05/2024   Sepsis secondary to UTI (HCC) 01/05/2024   Primary open angle glaucoma (POAG) of both eyes, mild stage 01/23/2023   Hyperlipidemia 09/27/2022   Diabetic neuropathy (HCC) 09/27/2022   Stage 4 chronic kidney disease (HCC) 09/27/2022   Wide-complex tachycardia 07/31/2021   Permanent atrial fibrillation (HCC) 07/30/2021   Closed T11 fracture (HCC) 07/30/2021   Dehydration 07/30/2021   Pneumonia due to COVID-19 virus 05/06/2020   COVID-19  virus infection 05/05/2020   Closed fracture of right proximal humerus 07/20/2017   S/P reverse total shoulder arthroplasty, 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   AAA (abdominal aortic aneurysm) without rupture (HCC) 06/03/2017   Preop cardiovascular exam 06/01/2017   Physical deconditioning    Ureteral stone with hydronephrosis 05/09/2017   Essential hypertension 05/09/2017   Non-insulin dependent type 2 diabetes mellitus (HCC) 05/09/2017   Hyponatremia 05/09/2017   Acute renal failure superimposed on stage 3a chronic kidney disease (HCC) 05/09/2017   Atrial fibrillation with RVR (HCC) 05/09/2017   Sepsis (HCC) 05/09/2017    Orientation RESPIRATION BLADDER Height & Weight     Self, Situation, Place  Normal Incontinent, External catheter Weight: 228 lb 2.8 oz (103.5 kg) Height:  6' (182.9 cm)  BEHAVIORAL SYMPTOMS/MOOD NEUROLOGICAL BOWEL NUTRITION STATUS      Incontinent Diet (Carb modified)  AMBULATORY STATUS COMMUNICATION OF NEEDS Skin   Extensive Assist Verbally PU Stage and Appropriate Care PU Stage 1 Dressing:  (PRN)                     Personal Care Assistance Level of Assistance  Feeding, Bathing, Dressing Bathing Assistance: Maximum assistance Feeding assistance: Limited assistance Dressing Assistance: Maximum assistance     Functional Limitations Info  Sight, Hearing, Speech Sight Info: Impaired Hearing Info: Impaired Speech Info: Adequate    SPECIAL CARE FACTORS FREQUENCY  PT (By licensed PT), OT (By licensed OT)     PT Frequency: 5x/wk OT Frequency: 5x/wk            Contractures Contractures Info: Not present    Additional Factors Info  Code Status, Allergies Code Status Info: FULL Allergies Info:  No Known Allergies           Current Medications (02/28/2024):  This is the current hospital active medication list Current Facility-Administered Medications  Medication Dose Route Frequency Provider Last Rate Last Admin   acetaminophen (TYLENOL) tablet 650 mg  650  mg Oral Q6H PRN Janalyn Shy, Subrina, MD   650 mg at 02/26/24 2143   Or   acetaminophen (TYLENOL) suppository 650 mg  650 mg Rectal Q6H PRN Janalyn Shy, Subrina, MD       apixaban Everlene Balls) tablet 2.5 mg  2.5 mg Oral BID Janalyn Shy, Subrina, MD   2.5 mg at 02/28/24 1610   carvedilol (COREG) tablet 6.25 mg  6.25 mg Oral BID WC Sundil, Subrina, MD   6.25 mg at 02/28/24 9604   gabapentin (NEURONTIN) capsule 300 mg  300 mg Oral QHS Sundil, Subrina, MD   300 mg at 02/27/24 2057   lip balm (CARMEX) ointment   Topical PRN Zigmund Daniel., MD       ondansetron [facility]) tablet 4 mg  4 mg Oral Q6H PRN Janalyn Shy, Subrina, MD       Or   ondansetron [facility]) injection 4 mg  4 mg Intravenous Q6H PRN Janalyn Shy, Subrina, MD       simvastatin (ZOCOR) tablet 10 mg  10 mg Oral QPM Sundil, Subrina, MD   10 mg at 02/27/24 1746   sodium chloride flush (NS) 0.9 % injection 3 mL  3 mL Intravenous Q12H Sundil, Subrina, MD   3 mL at 02/28/24 0941   sodium chloride flush (NS) 0.9 % injection 3 mL  3 mL Intravenous PRN Tereasa Coop, MD         Discharge Medications: Please see discharge summary for a list of discharge medications.  Relevant Imaging Results:  Relevant Lab Results:   Additional Information SSN: [id number]  Otelia Santee, LCSW

## 2024-02-28 NOTE — TOC Progression Note (Signed)
 Transition of Care [facility]) - Progression Note    Patient Details  Name: Charles Richard MRN: [id number] Date of Birth: 29-Apr-1930  [facility] CM/SW Contact  Otelia Santee, LCSW Phone Number: 02/28/2024, 1:46 PM  Clinical Narrative:    Spoke with pt's daughter via t/c and confirmed plan for SNF placement. Daughter denies pt going to SNF before and states her top choice is for [facility] in [place]. CSW explained SNF process w/ VA insurance. CLC checklist sent to the [facility] for review for SNF. Awaiting VA approval and list of SNF options.  PASRR obtained: [id number]   Expected Discharge Plan: Home w Home Health Services Barriers to Discharge: Continued Medical Work up  Expected Discharge Plan and Services   Discharge Planning Services: CM Consult   Living arrangements for the past 2 months: Single Family Home                 DME Arranged: N/A DME Agency: NA                   Social Determinants of Health (SDOH) Interventions SDOH Screenings   Food Insecurity: No Food Insecurity (02/27/2024)  Housing: Patient Declined (02/27/2024)  Transportation Needs: No Transportation Needs (02/27/2024)  Utilities: Not At Risk (02/27/2024)  Social Connections: Patient Unable To Answer (02/27/2024)  Tobacco Use: Low Risk  (02/26/2024)    Readmission Risk Interventions    02/10/2024   12:16 PM 01/07/2024   10:49 AM  Readmission Risk Prevention Plan  Transportation Screening Complete Complete  PCP or Specialist Appt within 3-5 Days Complete   HRI or Home Care Consult Complete Complete  Social Work Consult for Recovery Care Planning/Counseling Complete Complete  Palliative Care Screening Not Applicable Not Applicable  Medication Review Oceanographer) Complete Complete

## 2024-02-28 NOTE — Evaluation (Signed)
 Physical Therapy Evaluation Patient Details Name: Charles Richard MRN: [id number] DOB: 04-Oct-1930 Today's Date: 02/28/2024  History of Present Illness  89 year old presents to the ED on 02/25/24 after a recent hospitalization on 02/09/24 via due to fever, dysuria, and cough and admitted for urosepsis. Pt returned to [facility] due to  generalized weakness. Admitted with Sepsis secondary to UTI and norovirus.  PMHx: dementia, AAA, permanent A-fib on Eliquis, DM 2, HTN, impaired hearing, CVA, HLD, CKD 3B.  Pt with recent admission 1/25 - 01/09/2024 for obstructive renal stone requiring emergent cystoscopy and left-sided stent placement.  Eventually discharged on 10 days of oral antibiotics and Foley catheter.  Clinical Impression  Pt admitted with above diagnosis. Pt requires assist for all levels of mobility at this time, able to maintain balance EOB, fatigues quickly. Pt requires Max A to stand and has dec tol to standing in the presence of frequent BM urges and voids in standing and sitting multiple times. Pt is able to step pivot to the chair, he does better with STS from chair due to solid support surface vs pushing from bed. Based on pt PLOF, level of support, and current functional status, he would benefit from skilled PT <3 hours per day at dc. Pt currently with functional limitations due to the deficits listed below (see PT Problem List). Pt will benefit from acute skilled PT to increase their independence and safety with mobility to allow discharge.           If plan is discharge home, recommend the following: Assistance with cooking/housework;Assist for transportation;A lot of help with walking and/or transfers;Two people to help with walking and/or transfers;A lot of help with bathing/dressing/bathroom;Help with stairs or ramp for entrance   Can travel by private vehicle   No    Equipment Recommendations None recommended by PT  Recommendations for Other Services       Functional Status  Assessment Patient has had a recent decline in their functional status and demonstrates the ability to make significant improvements in function in a reasonable and predictable amount of time.     Precautions / Restrictions Precautions Precautions: Fall Recall of Precautions/Restrictions: Intact Precaution/Restrictions Comments: likes to wear his shoes (grip). Enteric Precautions Restrictions Weight Bearing Restrictions Per Provider Order: No      Mobility  Bed Mobility Overal bed mobility: Needs Assistance Bed Mobility: Supine to Sit     Supine to sit: Max assist, HOB elevated, Used rails Sit to supine: Min assist   General bed mobility comments: CGA to elevate trunk, min A with LEs    Transfers Overall transfer level: Needs assistance Equipment used: Rolling walker (2 wheels) Transfers: Sit to/from Stand Sit to Stand: +2 physical assistance, Max assist   Step pivot transfers: Max assist, +2 physical assistance       General transfer comment: pt has frequent urge for BM which dec tolerance to standing and mobility    Ambulation/Gait                  Stairs            Wheelchair Mobility     Tilt Bed    Modified Rankin (Stroke Patients Only)       Balance Overall balance assessment: Needs assistance Sitting-balance support: Feet supported, Single extremity supported Sitting balance-Leahy Scale: Fair     Standing balance support: Bilateral upper extremity supported, Reliant on assistive device for balance, During functional activity Standing balance-Leahy Scale: Poor  Pertinent Vitals/Pain Pain Assessment Pain Assessment: No/denies pain Pain Intervention(s): Monitored during session    Home Living Family/patient expects to be discharged to:: Private residence Living Arrangements: Children Available Help at Discharge: Family;Available 24 hours/day Type of Home: House Home Access: Ramped  entrance       Home Layout: One level Home Equipment: Rollator (4 wheels);BSC/3in1;Transport chair;Rolling Walker (2 wheels);Cane - single point;Shower seat;Hand held shower head;Grab bars - toilet;Grab bars - tub/shower;Hospital bed;Lift chair Additional Comments: lives with daughter    Prior Function Prior Level of Function : Needs assist  Cognitive Assist : Mobility (cognitive);ADLs (cognitive) Mobility (Cognitive): Intermittent cues ADLs (Cognitive): Intermittent cues Physical Assist : Mobility (physical) Mobility (physical): Bed mobility;Transfers;Gait   Mobility Comments: uses rollator at baseline, able to get  up to amb without assistance,  has a lift chair ADLs Comments: patient has assist with showers from family 3x/wk and able to participate in UB bathing/LB bathing, able to dress himself, uses rollator at baseline, has a lift chair.  Daughter, [name] reports that since home from recent admission this month, pt has been weak and requiring increased assistance with eventual inability to stand and need of EMS call.     Extremity/Trunk Assessment   Upper Extremity Assessment Upper Extremity Assessment: Generalized weakness    Lower Extremity Assessment Lower Extremity Assessment: Generalized weakness    Cervical / Trunk Assessment Cervical / Trunk Assessment: Kyphotic  Communication   Communication Communication: Impaired Factors Affecting Communication: Hearing impaired    Cognition Arousal: Alert Behavior During Therapy: WFL for tasks assessed/performed   PT - Cognitive impairments: No apparent impairments                       PT - Cognition Comments: AxO x 3 pleasant and willing.  Lives home with Aide M-F 9 -5 while daughter is at work.  Then rotating family support nights/weekends.  VERY HOH even with one R hearing aide Following commands: Intact       Cueing Cueing Techniques: Verbal cues, Gestural cues, Tactile cues, Visual cues     General  Comments General comments (skin integrity, edema, etc.): freq urge for BM    Exercises     Assessment/Plan    PT Assessment Patient needs continued PT services  PT Problem List Decreased strength;Decreased mobility;Decreased safety awareness;Decreased knowledge of precautions;Decreased activity tolerance;Decreased balance       PT Treatment Interventions DME instruction;Gait training;Functional mobility training;Therapeutic activities;Therapeutic exercise;Patient/family education;Balance training    PT Goals (Current goals can be found in the Care Plan section)  Acute Rehab PT Goals Patient Stated Goal: improve strength to return home PT Goal Formulation: With patient/family Time For Goal Achievement: 03/13/24 Potential to Achieve Goals: Good    Frequency Min 2X/week     Co-evaluation PT/OT/SLP Co-Evaluation/Treatment: Yes Reason for Co-Treatment: For patient/therapist safety;To address functional/ADL transfers PT goals addressed during session: Mobility/safety with mobility;Balance OT goals addressed during session: ADL's and self-care       AM-PAC PT "6 Clicks" Mobility  Outcome Measure Help needed turning from your back to your side while in a flat bed without using bedrails?: A Little Help needed moving from lying on your back to sitting on the side of a flat bed without using bedrails?: A Lot Help needed moving to and from a bed to a chair (including a wheelchair)?: A Lot Help needed standing up from a chair using your arms (e.g., wheelchair or bedside chair)?: A Lot Help needed to walk in hospital  room?: Total Help needed climbing 3-5 steps with a railing? : Total 6 Click Score: 11    End of Session Equipment Utilized During Treatment: Gait belt Activity Tolerance: Patient limited by fatigue Patient left: in chair;with call bell/phone within reach;with family/visitor present;with chair alarm set Nurse Communication: Mobility status PT Visit Diagnosis: Muscle  weakness (generalized) (M62.81);Difficulty in walking, not elsewhere classified (R26.2);Unsteadiness on feet (R26.81);Other abnormalities of gait and mobility (R26.89)    Time: 0630-1601 PT Time Calculation (min) (ACUTE ONLY): 24 min   Charges:   PT Evaluation $PT Eval Low Complexity: 1 Low   PT General Charges $$ ACUTE PT VISIT: 1 Visit         Madaline Guthrie, PT Acute Rehabilitation Services Office: [phone] 02/28/2024   Evelena Peat 02/28/2024, 11:16 AM

## 2024-02-29 DIAGNOSIS — K529 Noninfective gastroenteritis and colitis, unspecified: Secondary | ICD-10-CM | POA: Diagnosis not present

## 2024-02-29 DIAGNOSIS — A0811 Acute gastroenteropathy due to Norwalk agent: Secondary | ICD-10-CM

## 2024-02-29 DIAGNOSIS — F03C0 Unspecified dementia, severe, without behavioral disturbance, psychotic disturbance, mood disturbance, and anxiety: Secondary | ICD-10-CM | POA: Diagnosis not present

## 2024-02-29 DIAGNOSIS — Z7189 Other specified counseling: Secondary | ICD-10-CM | POA: Diagnosis not present

## 2024-02-29 DIAGNOSIS — K5289 Other specified noninfective gastroenteritis and colitis: Secondary | ICD-10-CM | POA: Diagnosis not present

## 2024-02-29 DIAGNOSIS — A419 Sepsis, unspecified organism: Secondary | ICD-10-CM | POA: Diagnosis not present

## 2024-02-29 DIAGNOSIS — N12 Tubulo-interstitial nephritis, not specified as acute or chronic: Secondary | ICD-10-CM | POA: Diagnosis not present

## 2024-02-29 DIAGNOSIS — N179 Acute kidney failure, unspecified: Secondary | ICD-10-CM | POA: Diagnosis not present

## 2024-02-29 LAB — CBC
HCT: 32.5 % — ABNORMAL LOW (ref 39.0–52.0)
Hemoglobin: 9.9 g/dL — ABNORMAL LOW (ref 13.0–17.0)
MCH: 30.8 pg (ref 26.0–34.0)
MCHC: 30.5 g/dL (ref 30.0–36.0)
MCV: 101.2 fL — ABNORMAL HIGH (ref 80.0–100.0)
Platelets: 253 10*3/uL (ref 150–400)
RBC: 3.21 MIL/uL — ABNORMAL LOW (ref 4.22–5.81)
RDW: 13.1 % (ref 11.5–15.5)
WBC: 8.5 10*3/uL (ref 4.0–10.5)
nRBC: 0 % (ref 0.0–0.2)

## 2024-02-29 LAB — GLUCOSE, CAPILLARY
Glucose-Capillary: 112 mg/dL — ABNORMAL HIGH (ref 70–99)
Glucose-Capillary: 216 mg/dL — ABNORMAL HIGH (ref 70–99)
Glucose-Capillary: 153 mg/dL — ABNORMAL HIGH (ref 70–99)
Glucose-Capillary: 141 mg/dL — ABNORMAL HIGH (ref 70–99)

## 2024-02-29 LAB — MAGNESIUM: Magnesium: 1.4 mg/dL — ABNORMAL LOW (ref 1.7–2.4)

## 2024-02-29 LAB — BASIC METABOLIC PANEL
Anion gap: 13 (ref 5–15)
BUN: 39 mg/dL — ABNORMAL HIGH (ref 8–23)
CO2: 22 mmol/L (ref 22–32)
Calcium: 8.2 mg/dL — ABNORMAL LOW (ref 8.9–10.3)
Chloride: 108 mmol/L (ref 98–111)
Creatinine, Ser: 2.28 mg/dL — ABNORMAL HIGH (ref 0.61–1.24)
GFR, Estimated: 26 mL/min — ABNORMAL LOW (ref >60)
Glucose, Bld: 97 mg/dL (ref 70–99)
Potassium: 3.8 mmol/L (ref 3.5–5.1)
Sodium: 143 mmol/L (ref 135–145)

## 2024-02-29 LAB — PHOSPHORUS: Phosphorus: 2 mg/dL — ABNORMAL LOW (ref 2.5–4.6)

## 2024-02-29 MED ORDER — MAGNESIUM SULFATE 2 GM/50ML IV SOLN
2.0000 g | Freq: Once | INTRAVENOUS | Status: AC
  Administered 2024-02-29: 2 g via INTRAVENOUS
  Filled 2024-02-29: qty 50

## 2024-02-29 NOTE — TOC Progression Note (Addendum)
 Transition of Care [facility]) - Progression Note    Patient Details  Name: Charles Richard MRN: [id number] Date of Birth: 1930/07/26  Transition of Care [facility]) CM/SW Contact  Otelia Santee, LCSW Phone Number: 02/29/2024, 11:16 AM  Clinical Narrative:    SNF referrals have been faxed out to [facility] facilities.  Still awaiting confirmation from [facility] for SNF placement.  ADDENDUM: [facility] approved SNF for 30-days. Reviewed bed offers with pt's daughter. Daughter has accepted offer for [facility] in [place]. [facility] able to accept pt once off of enteric precautions. Left VM w/ Tomasa Hose [phone]) w/ the [facility] to generate authorization for SNF.  Pt's daughter also agreeable to recommendation for outpatient palliative care and has selected [facility] as agency of choice. Referral has been made to [facility] for palliative care services.  Expected Discharge Plan: Home w Home Health Services Barriers to Discharge: Continued Medical Work up  Expected Discharge Plan and Services   Discharge Planning Services: CM Consult   Living arrangements for the past 2 months: Single Family Home                 DME Arranged: N/A DME Agency: NA                   Social Determinants of Health (SDOH) Interventions SDOH Screenings   Food Insecurity: No Food Insecurity (02/27/2024)  Housing: Patient Declined (02/27/2024)  Transportation Needs: No Transportation Needs (02/27/2024)  Utilities: Not At Risk (02/27/2024)  Social Connections: Patient Unable To Answer (02/27/2024)  Tobacco Use: Low Risk  (02/26/2024)    Readmission Risk Interventions    02/10/2024   12:16 PM 01/07/2024   10:49 AM  Readmission Risk Prevention Plan  Transportation Screening Complete Complete  PCP or Specialist Appt within 3-5 Days Complete   HRI or Home Care Consult Complete Complete  Social Work Consult for Recovery Care Planning/Counseling Complete Complete  Palliative Care Screening Not Applicable Not Applicable   Medication Review Oceanographer) Complete Complete

## 2024-02-29 NOTE — Progress Notes (Signed)
 [facility]  PROGRESS NOTE  Charles Richard NWG:[id number] DOB: 1930-12-08 DOA: 02/25/2024 PCP: [facility]   Brief HPI:   89 y.o. male with medical history significant of dementia, AAA, permanent atrial fibrillation on Eliquis, DM type II, essential hypertension, impaired hearing, CVA, hyperlipidemia, CKD stage IIIb and history of recurrent UTI status post cystoscopy left-sided stent placement then removal on 02/19/2024 presented to emergency department with complaining of generalized weakness.    Workup so far shows possible stercoral colitis and norovirus infection.   Working on SNF placement.  Palliative care has been consulted.     Assessment/Plan:   Sepsis due to Colitis Norovirus Infection Ruled in for sepsis with leukocytosis, tachycardia, norovirus infection There was concern for pyelonephritis on presentation (notably with recent stent removal on 3/11), but urine culture is negative CT showed distension of rectum with colonic wall thickening and mild fat stranding (? Stercoral colitis) Blood cultures Ngx2 Urine culture negative  Notably, stool studies notable for norovirus  Antibiotics discontinued.   He'll need good bowel regimen once his stools slow down given evidence of possible stercoral colitis   AKI on CKD IV AGMA Unclear baseline creatinine - creatinine is currently better than it's been in the recent past few months Recent CT stone study with mild prominence of renal collecting system on R    Recent Cystoscopy with L Ureteroscopy, laser lithostripsy, and L ureteral stent exchange 3/10 Stent removed on 3/12 noted   Hypertension Coreg    Hypokalemia Replete   Hypomagnesemia Replace and follow  Diabetes mellitus type 2 A1c 12/2023 6.5 Diet controlled, will monitor BG's on BMP's   "Small bowel" calcification noted on CT imaging Previous hospitalist discussed with radiology, likely calcified artery of the mesentery or renal arteries.  No management needed.   Aortic Atherosclerosis   Goals of Care Palliative care consulted -Plan for outpatient palliative care.  Skilled nursing facility      Medications     apixaban  2.5 mg Oral BID   carvedilol  6.25 mg Oral BID WC   gabapentin  300 mg Oral QHS   magnesium oxide  400 mg Oral Daily   simvastatin  10 mg Oral QPM   sodium chloride flush  3 mL Intravenous Q12H     Data Reviewed:   CBG:  Recent Labs  Lab 02/28/24 1207 02/28/24 1630 02/28/24 2128 02/29/24 0737 02/29/24 1155  GLUCAP 147* 141* 119* 112* 216*    SpO2: 94 %    Vitals:   02/28/24 1205 02/28/24 1209 02/28/24 2017 02/29/24 0429  BP:  (!) 137/116 122/75 123/72  Pulse: (!) 140 97 96 (!) 101  Resp:   18 17  Temp:  98.3 F (36.8 C) 99.2 F (37.3 C) 99.3 F (37.4 C)  TempSrc:  Oral    SpO2:  97% 94% 94%  Weight:      Height:          Data Reviewed:  Basic Metabolic Panel: Recent Labs  Lab 02/25/24 2156 02/26/24 0726 02/27/24 0604 02/28/24 0557 02/29/24 0530  NA 141 142 141 139 143  K 5.3* 4.5 3.4* 3.0* 3.8  CL 107 110 103 103 108  CO2 18* 18* 21* 24 22  GLUCOSE 146* 105* 82 112* 97  BUN 50* 47* 42* 43* 39*  CREATININE 3.35* 2.99* 2.65* 2.42* 2.28*  CALCIUM 9.0 8.5* 8.1* 8.0* 8.2*  MG  --   --   --  1.5* 1.4*  PHOS  --   --   --  2.8 2.0*    CBC: Recent Labs  Lab 02/25/24 2156 02/26/24 0726 02/27/24 0604 02/28/24 0557 02/29/24 0530  WBC 22.4* 18.6* 15.1* 10.8* 8.5  NEUTROABS 19.6*  --   --  7.8*  --   HGB 10.7* 9.5* 8.8* 9.6* 9.9*  HCT 33.8* 31.9* 29.4* 31.7* 32.5*  MCV 97.7 101.9* 101.7* 100.6* 101.2*  PLT 375 321 284 280 253    LFT Recent Labs  Lab 02/25/24 2156 02/26/24 0726 02/28/24 0557  AST 21 16 15   ALT 19 15 13   ALKPHOS 45 36* 35*  BILITOT 0.9 1.0 0.8  PROT 7.2 6.1* 5.7*  ALBUMIN 2.8* 2.2* 2.0*     Antibiotics: Anti-infectives (From admission, onward)    Start     Dose/Rate Route Frequency Ordered Stop   02/27/24 0000  cefTRIAXone  (ROCEPHIN) 2 g in sodium chloride 0.9 % 100 mL IVPB  Status:  Discontinued        2 g 200 mL/hr over 30 Minutes Intravenous Every 24 hours 02/26/24 0236 02/28/24 0832   02/26/24 0800  metroNIDAZOLE (FLAGYL) IVPB 500 mg  Status:  Discontinued        500 mg 100 mL/hr over 60 Minutes Intravenous Every 12 hours 02/26/24 0236 02/28/24 0832   02/26/24 0115  cefTRIAXone (ROCEPHIN) 2 g in sodium chloride 0.9 % 100 mL IVPB       Placed in "And" Linked Group   2 g 200 mL/hr over 30 Minutes Intravenous  Once 02/26/24 0106 02/26/24 0346   02/26/24 0115  metroNIDAZOLE (FLAGYL) IVPB 500 mg       Placed in "And" Linked Group   500 mg 100 mL/hr over 60 Minutes Intravenous  Once 02/26/24 0106 02/26/24 0346        DVT prophylaxis: Apixaban  Code Status: DNR  Family Communication: No family at bedside   CONSULTS    Subjective   Denies abdominal pain.   Objective    Physical Examination:   General-appears in no acute distress Heart-S1-S2, regular, no murmur auscultated Lungs-clear to auscultation bilaterally, no wheezing or crackles auscultated Abdomen-soft, nontender, no organomegaly Extremities-no edema in the lower extremities Neuro-alert, oriented x3, no focal deficit noted  Status is: Inpatient:      Pressure Injury 02/27/24 Buttocks Right;Left Stage 1 -  Intact skin with non-blanchable redness of a localized area usually over a bony prominence. (Active)  02/27/24 2005  Location: Buttocks  Location Orientation: Right;Left  Staging: Stage 1 -  Intact skin with non-blanchable redness of a localized area usually over a bony prominence.  Wound Description (Comments):   Present on Admission:         Melenda Bielak S Jerryl Holzhauer   [facility] If 7PM-7AM, please contact night-coverage at [email or web address], Office  [phone]   02/29/2024, 11:59 AM  LOS: 3 days

## 2024-02-29 NOTE — Plan of Care (Signed)

## 2024-02-29 NOTE — Plan of Care (Signed)

## 2024-02-29 NOTE — Consult Note (Signed)
 Consultation Note Date: 02/29/2024   Patient Name: Charles Richard  DOB: 01/27/1930  MRN: [id number]  Age / Sex: 89 y.o., male  PCP: [facility] Referring Physician: Meredeth Ide, MD  Reason for Consultation:  goals of care  HPI/Patient Profile: 89 y.o. male  with past medical history of dementia, AAA, a-fib, DM, impaired hearing, CVA, nephrolithiasis- recent admission with UTI and stent removal- admitted on 02/25/2024 with diarrhea, weakness. Labs reviewed- stool + for norovirus. CT reviewed- concern for stercoral colitis. Palliative consulted for goals of care.   Primary Decision Maker NEXT OF KIN - patient's daughter- Charles Richard  Discussion: Chart reviewed including labs, progress notes, imaging from this and previous encounters.  Evaluated patient- he was in bed, appeared weak, frail, did not answer my questions. Met with his daughter Charles Richard at bedside.  Prior to admission- living at home. Has paid caregiver assistance. Was ambulating however, had acute onset of weakness and could not get up from toilet. He is unable to recognize family members. He often asks about his sister who is deceased.  Charles Richard is hopeful he will be able to regain some strength- she cannot take him home in his current condition. She notes he has continued to decline since his last hospitalization in early March.  We discussed the difficulty he is likely to have to return to his prior functional state, and how his acute illnesses can worsen his dementia.  Options for continued aggressive medical interventions vs transition to comfort and hospice were discussed. Discussed transition to comfort measures only which includes stopping IV fluids, antibiotics, labs and providing symptom management for SOB, anxiety, nausea, vomiting, and other symptoms of dying.  At this time Charles Richard wishes to continue on current care plan with plan of  dc to SNF.  She is agreeable to outpatient Palliative following patient when he leaves hospital. I encouraged her to prepare for the fact that he may not thrive at SNF and may decline further, she is aware and would likely choose hospice if this happened.   SUMMARY OF RECOMMENDATIONS -Continue current plan -TOC order placed for referral to outpatient Palliative to see at SNF    Code Status/Advance Care Planning:   Code Status: Limited: Do not attempt resuscitation (DNR) -DNR-LIMITED -Do Not Intubate/DNI     Prognosis:   Unable to determine  Discharge Planning: Skilled Nursing Facility for rehab with Palliative care service follow-up  Primary Diagnoses: Present on Admission:  Sepsis secondary to UTI (HCC)  Acute kidney injury superimposed on chronic kidney disease (HCC)  Hyperkalemia  Permanent atrial fibrillation (HCC)   Review of Systems  Physical Exam  Vital Signs: BP 123/72 (BP Location: Left Arm)   Pulse (!) 101   Temp 99.3 F (37.4 C)   Resp 17   Ht 6' (1.829 m)   Wt 103.5 kg   SpO2 94%   BMI 30.95 kg/m  Pain Scale: 0-10   Pain Score: 5    SpO2: SpO2: 94 % O2 Device:SpO2: 94 % O2 Flow Rate: .  IO: Intake/output summary:  Intake/Output Summary (Last 24 hours) at 02/29/2024 1313 Last data filed at 02/29/2024 1201 Gross per 24 hour  Intake 540 ml  Output 1200 ml  Net -660 ml    LBM: Last BM Date : 02/28/24 Baseline Weight: Weight: 103.5 kg Most recent weight: Weight: 103.5 kg       Thank you for this consult. Palliative medicine will continue to follow and assist as needed.  Time Total: 80 minutes Signed by: Ocie Bob, AGNP-C Palliative Medicine  Time includes:   Preparing to see the patient (e.g., review of tests) Obtaining and/or reviewing separately obtained history Performing a medically necessary appropriate examination and/or evaluation Counseling and educating the patient/family/caregiver Ordering medications, tests, or  procedures Referring and communicating with other health care professionals (when not reported separately) Documenting clinical information in the electronic or other health record Independently interpreting results (not reported separately) and communicating results to the patient/family/caregiver Care coordination (not reported separately) Clinical documentation   Please contact Palliative Medicine Team phone at [phone] for questions and concerns.  For individual provider: See Loretha Stapler

## 2024-02-29 NOTE — Progress Notes (Signed)
 [id number]- [facility] Liaison Note: Notified by [facility] manager of patient/family request for [facility] Palliative services at home after discharge. Please call with any hospice or outpatient palliative care related questions. Thank you for the opportunity to participate in this patient's care. Henderson Newcomer, LPN [facility] Liaison [phone]

## 2024-03-01 DIAGNOSIS — N12 Tubulo-interstitial nephritis, not specified as acute or chronic: Secondary | ICD-10-CM | POA: Diagnosis not present

## 2024-03-01 DIAGNOSIS — K529 Noninfective gastroenteritis and colitis, unspecified: Secondary | ICD-10-CM | POA: Diagnosis not present

## 2024-03-01 DIAGNOSIS — A419 Sepsis, unspecified organism: Secondary | ICD-10-CM | POA: Diagnosis not present

## 2024-03-01 DIAGNOSIS — N179 Acute kidney failure, unspecified: Secondary | ICD-10-CM | POA: Diagnosis not present

## 2024-03-01 LAB — GLUCOSE, CAPILLARY
Glucose-Capillary: 139 mg/dL — ABNORMAL HIGH (ref 70–99)
Glucose-Capillary: 196 mg/dL — ABNORMAL HIGH (ref 70–99)
Glucose-Capillary: 143 mg/dL — ABNORMAL HIGH (ref 70–99)
Glucose-Capillary: 147 mg/dL — ABNORMAL HIGH (ref 70–99)

## 2024-03-01 LAB — MAGNESIUM: Magnesium: 2 mg/dL (ref 1.7–2.4)

## 2024-03-01 NOTE — Progress Notes (Signed)
 [facility]  PROGRESS NOTE  Charles Richard ZOX:[id number] DOB: 1930-09-25 DOA: 02/25/2024 [facility]CP: [facility]   Brief HPI:   89 y.o. male with medical history significant of dementia, AAA, permanent atrial fibrillation on Eliquis, DM type II, essential hypertension, impaired hearing, CVA, hyperlipidemia, CKD stage IIIb and history of recurrent UTI status post cystoscopy left-sided stent placement then removal on 02/19/2024 presented to emergency department with complaining of generalized weakness.    Workup so far shows possible stercoral colitis and norovirus infection.   Working on SNF placement.  Palliative care has been consulted.     Assessment/Plan:   Sepsis due to Colitis Norovirus Infection Ruled in for sepsis with leukocytosis, tachycardia, norovirus infection There was concern for pyelonephritis on presentation (notably with recent stent removal on 3/11), but urine culture is negative CT showed distension of rectum with colonic wall thickening and mild fat stranding (? Stercoral colitis) Blood cultures Ngx2 Urine culture negative  Notably, stool studies notable for norovirus  Antibiotics discontinued.   He'll need good bowel regimen once his stools slow down given evidence of possible stercoral colitis   AKI on CKD IV AGMA Unclear baseline creatinine - creatinine is currently better than it's been in the recent past few months Recent CT stone study with mild prominence of renal collecting system on R    Recent Cystoscopy with L Ureteroscopy, laser lithostripsy, and L ureteral stent exchange 3/10 Stent removed on 3/12 noted   Hypertension Coreg    Hypokalemia Replete   Hypomagnesemia Replace and follow  Diabetes mellitus type 2 A1c 12/2023 6.5 Diet controlled, will monitor BG's on BMP's   "Small bowel" calcification noted on CT imaging Previous hospitalist discussed with radiology, likely calcified artery of the mesentery or renal arteries.  No management needed.      Goals of Care Palliative care consulted -Plan for outpatient palliative care.  Skilled nursing facility   Discussed with patient daughter who wants repeat PT evaluation to see if patient can go home with home health PT.  Will again consult PT.   Medications     apixaban  2.5 mg Oral BID   carvedilol  6.25 mg Oral BID WC   gabapentin  300 mg Oral QHS   simvastatin  10 mg Oral QPM   sodium chloride flush  3 mL Intravenous Q12H     Data Reviewed:   CBG:  Recent Labs  Lab 02/29/24 0737 02/29/24 1155 02/29/24 1648 02/29/24 2149 03/01/24 0737  GLUCAP 112* 216* 153* 141* 139*    SpO2: 98 %    Vitals:   02/29/24 0429 02/29/24 1346 02/29/24 2115 03/01/24 0626  BP: 123/72 128/85 105/72 112/67  Pulse: (!) 101 (!) 57 98 99  Resp: 17 17 18 16   Temp: 99.3 F (37.4 C) 97.7 F (36.5 C) 98.7 F (37.1 C) 98 F (36.7 C)  TempSrc:  Oral Oral Oral  SpO2: 94% 100% 98% 98%  Weight:      Height:          Data Reviewed:  Basic Metabolic Panel: Recent Labs  Lab 02/25/24 2156 02/26/24 0726 02/27/24 0604 02/28/24 0557 02/29/24 0530 03/01/24 0640  NA 141 142 141 139 143  --   K 5.3* 4.5 3.4* 3.0* 3.8  --   CL 107 110 103 103 108  --   CO2 18* 18* 21* 24 22  --   GLUCOSE 146* 105* 82 112* 97  --   BUN 50* 47* 42* 43* 39*  --  CREATININE 3.35* 2.99* 2.65* 2.42* 2.28*  --   CALCIUM 9.0 8.5* 8.1* 8.0* 8.2*  --   MG  --   --   --  1.5* 1.4* 2.0  PHOS  --   --   --  2.8 2.0*  --     CBC: Recent Labs  Lab 02/25/24 2156 02/26/24 0726 02/27/24 0604 02/28/24 0557 02/29/24 0530  WBC 22.4* 18.6* 15.1* 10.8* 8.5  NEUTROABS 19.6*  --   --  7.8*  --   HGB 10.7* 9.5* 8.8* 9.6* 9.9*  HCT 33.8* 31.9* 29.4* 31.7* 32.5*  MCV 97.7 101.9* 101.7* 100.6* 101.2*  PLT 375 321 284 280 253    LFT Recent Labs  Lab 02/25/24 2156 02/26/24 0726 02/28/24 0557  AST 21 16 15   ALT 19 15 13   ALKPHOS 45 36* 35*  BILITOT 0.9 1.0 0.8  PROT 7.2 6.1* 5.7*   ALBUMIN 2.8* 2.2* 2.0*     Antibiotics: Anti-infectives (From admission, onward)    Start     Dose/Rate Route Frequency Ordered Stop   02/27/24 0000  cefTRIAXone (ROCEPHIN) 2 g in sodium chloride 0.9 % 100 mL IVPB  Status:  Discontinued        2 g 200 mL/hr over 30 Minutes Intravenous Every 24 hours 02/26/24 0236 02/28/24 0832   02/26/24 0800  metroNIDAZOLE (FLAGYL) IVPB 500 mg  Status:  Discontinued        500 mg 100 mL/hr over 60 Minutes Intravenous Every 12 hours 02/26/24 0236 02/28/24 0832   02/26/24 0115  cefTRIAXone (ROCEPHIN) 2 g in sodium chloride 0.9 % 100 mL IVPB       Placed in "And" Linked Group   2 g 200 mL/hr over 30 Minutes Intravenous  Once 02/26/24 0106 02/26/24 0346   02/26/24 0115  metroNIDAZOLE (FLAGYL) IVPB 500 mg       Placed in "And" Linked Group   500 mg 100 mL/hr over 60 Minutes Intravenous  Once 02/26/24 0106 02/26/24 0346        DVT prophylaxis: Apixaban  Code Status: DNR  Family Communication: Discussed with patient's daughter at bedside   CONSULTS    Subjective    Denies any complaints  Objective    Physical Examination:  Appears in no acute distress S1-S2, regular Lungs clear to auscultation bilaterally Abdomen is soft, nontender, no organomegaly  Status is: Inpatient:      Pressure Injury 02/27/24 Buttocks Right;Left Stage 1 -  Intact skin with non-blanchable redness of a localized area usually over a bony prominence. (Active)  02/27/24 2005  Location: Buttocks  Location Orientation: Right;Left  Staging: Stage 1 -  Intact skin with non-blanchable redness of a localized area usually over a bony prominence.  Wound Description (Comments):   Present on Admission:         Charles Richard S Charles Richard   [facility] If 7PM-7AM, please contact night-coverage at [email or web address], Office  [phone]   03/01/2024, 10:50 AM  LOS: 4 days

## 2024-03-02 DIAGNOSIS — N390 Urinary tract infection, site not specified: Secondary | ICD-10-CM | POA: Diagnosis not present

## 2024-03-02 DIAGNOSIS — A419 Sepsis, unspecified organism: Secondary | ICD-10-CM | POA: Diagnosis not present

## 2024-03-02 LAB — CULTURE, BLOOD (ROUTINE X 2)
Culture: NO GROWTH
Culture: NO GROWTH

## 2024-03-02 LAB — GLUCOSE, CAPILLARY
Glucose-Capillary: 122 mg/dL — ABNORMAL HIGH (ref 70–99)
Glucose-Capillary: 181 mg/dL — ABNORMAL HIGH (ref 70–99)
Glucose-Capillary: 164 mg/dL — ABNORMAL HIGH (ref 70–99)
Glucose-Capillary: 127 mg/dL — ABNORMAL HIGH (ref 70–99)

## 2024-03-02 NOTE — Plan of Care (Signed)
  Problem: Education: Goal: Knowledge of General Education information will improve Description: Including pain rating scale, medication(s)/side effects and non-pharmacologic comfort measures Outcome: Progressing   Problem: Clinical Measurements: Goal: Will remain free from infection Outcome: Progressing   Problem: Nutrition: Goal: Adequate nutrition will be maintained Outcome: Progressing   Problem: Coping: Goal: Level of anxiety will decrease Outcome: Progressing   Problem: Elimination: Goal: Will not experience complications related to bowel motility Outcome: Progressing Goal: Will not experience complications related to urinary retention Outcome: Progressing   Problem: Pain Managment: Goal: General experience of comfort will improve and/or be controlled Outcome: Progressing   Problem: Safety: Goal: Ability to remain free from injury will improve Outcome: Progressing   Problem: Skin Integrity: Goal: Risk for impaired skin integrity will decrease Outcome: Progressing

## 2024-03-02 NOTE — Progress Notes (Signed)
  Progress Note   Patient: Charles Richard WUJ:[id number] DOB: 07-24-1930 DOA: 02/25/2024     5 DOS: the patient was seen and examined on 03/02/2024   Brief hospital course: 89 y.o. male with medical history significant of dementia, AAA, permanent atrial fibrillation on Eliquis, DM type II, essential hypertension, impaired hearing, CVA, hyperlipidemia, CKD stage IIIb and history of recurrent UTI status post cystoscopy left-sided stent placement then removal on 02/19/2024 presented to emergency department with complaining of generalized weakness.    Workup revealed possible stercoral colitis and norovirus infection.  Patient was conservatively treated.  Case management working on SNF placement.  Palliative care had been consulted.   Assessment and Plan:  Sepsis due to Colitis: Present on admission. Norovirus Infection: Present on admission Ruled in for sepsis with leukocytosis, tachycardia, norovirus infection on presentation Urinalysis was benign, urine culture is negative CT showed distension of rectum with colonic wall thickening and mild fat stranding (? Stercoral colitis) Blood cultures negative x2 Antibiotics discontinued.   Stool softeners as needed upon discharge.   AKI on CKD IV-Unclear baseline creatinine.  Possible underlying diabetic nephropathy. creatinine is currently better Follow-up BMP in a.m. Recent CT study with mild prominence of renal collecting system with bilateral nephrolithiasis.  No evidence of any obstruction reported.  Diabetes mellitus type 2-A1c 12/2023 6.5 Diet controlled, will monitor BG's on BMP's    Recent Cystoscopy with L Ureteroscopy, laser lithostripsy, and L ureteral stent exchange 3/10 Stent removed on 3/12   Hypertension: Continue with Coreg    Hypomagnesemia and hypokalemia: Repleted as appropriate.  Protocol   "Small bowel" calcification noted on CT imaging Previous hospitalist discussed with radiology, likely calcified artery of the  mesentery or renal arteries. No management needed.   Obesity: Body mass index of 30.95.  Stage I pressure ulcer on the right buttock.  Nursing care for wound     Subjective: Patient daughter concerned that patient is laying and not being ambulated.  When patient was asked whether or not he would want to sit in the chair, patient declined.  He is alert and oriented.  Denies any abdominal pain at this time.  Patient daughter was reassured and will be plan for possible transfer to nursing home facility tomorrow  Physical Exam: Vitals:   03/01/24 1301 03/01/24 2055 03/02/24 0643 03/02/24 1256  BP: 117/73 114/61 (!) 91/56 111/62  Pulse: 95 98 85 82  Resp: 20 18 18 14   Temp: 98 F (36.7 C) 99.1 F (37.3 C) 99 F (37.2 C) 98.2 F (36.8 C)  TempSrc: Oral Oral Oral   SpO2: 100% 98% 96% 100%  Weight:      Height:       General: Elderly, frail gentleman.  Not in any acute distress. HEENT: Head is atraumatic, oral mucosa moist.  Not pale or jaundiced. Neck: Supple Cardiovascular: S1-S2 with no murmur Chest clinically clear without any added sounds.  Diminished breath sounds at the base of the lungs CNS shows no focal deficits Skin negative for any new rash.  Data Reviewed: Serum glucose was 181 No new labs for today.  Family Communication: Daughter at bedside was updated regarding plan of care  Disposition: Status is: Inpatient Remains inpatient appropriate because:   Planned Discharge Destination: Skilled nursing facility VTE prophylaxis with Eliquis 2.5 mg twice daily.  TED hose.    Time spent: 30 minutes  Author: Lilia Pro, MD 03/02/2024 3:34 PM  For on call review [email or web address].

## 2024-03-03 DIAGNOSIS — A419 Sepsis, unspecified organism: Secondary | ICD-10-CM | POA: Diagnosis not present

## 2024-03-03 DIAGNOSIS — N390 Urinary tract infection, site not specified: Secondary | ICD-10-CM | POA: Diagnosis not present

## 2024-03-03 LAB — CBC
HCT: 32.1 % — ABNORMAL LOW (ref 39.0–52.0)
Hemoglobin: 9.9 g/dL — ABNORMAL LOW (ref 13.0–17.0)
MCH: 31.2 pg (ref 26.0–34.0)
MCHC: 30.8 g/dL (ref 30.0–36.0)
MCV: 101.3 fL — ABNORMAL HIGH (ref 80.0–100.0)
Platelets: 248 10*3/uL (ref 150–400)
RBC: 3.17 MIL/uL — ABNORMAL LOW (ref 4.22–5.81)
RDW: 13.2 % (ref 11.5–15.5)
WBC: 8.4 10*3/uL (ref 4.0–10.5)
nRBC: 0 % (ref 0.0–0.2)

## 2024-03-03 LAB — BASIC METABOLIC PANEL
Anion gap: 8 (ref 5–15)
BUN: 43 mg/dL — ABNORMAL HIGH (ref 8–23)
CO2: 27 mmol/L (ref 22–32)
Calcium: 8.4 mg/dL — ABNORMAL LOW (ref 8.9–10.3)
Chloride: 105 mmol/L (ref 98–111)
Creatinine, Ser: 2.24 mg/dL — ABNORMAL HIGH (ref 0.61–1.24)
GFR, Estimated: 27 mL/min — ABNORMAL LOW (ref >60)
Glucose, Bld: 143 mg/dL — ABNORMAL HIGH (ref 70–99)
Potassium: 5.1 mmol/L (ref 3.5–5.1)
Sodium: 140 mmol/L (ref 135–145)

## 2024-03-03 LAB — GLUCOSE, CAPILLARY: Glucose-Capillary: 137 mg/dL — ABNORMAL HIGH (ref 70–99)

## 2024-03-03 MED ORDER — POLYETHYLENE GLYCOL 3350 17 G PO PACK: 17.0000 g | PACK | Freq: Every day | ORAL | Status: AC | PRN

## 2024-03-03 NOTE — Plan of Care (Signed)
 VSS. No c/o pain. BG 127 at bedtime. No acute events overnight.  Problem: Education: Goal: Knowledge of General Education information will improve Description: Including pain rating scale, medication(s)/side effects and non-pharmacologic comfort measures Outcome: Progressing   Problem: Clinical Measurements: Goal: Ability to maintain clinical measurements within normal limits will improve Outcome: Progressing Goal: Will remain free from infection Outcome: Progressing   Problem: Activity: Goal: Risk for activity intolerance will decrease Outcome: Progressing   Problem: Nutrition: Goal: Adequate nutrition will be maintained Outcome: Progressing   Problem: Safety: Goal: Ability to remain free from injury will improve Outcome: Progressing   Problem: Skin Integrity: Goal: Risk for impaired skin integrity will decrease Outcome: Progressing

## 2024-03-03 NOTE — Discharge Summary (Signed)
 Charles Richard VWU:[id number] DOB: 10/14/1930 DOA: 02/25/2024  PCP: [facility]  Admit date: 02/25/2024 Discharge date: 03/03/2024  Time spent: 35 minutes  Recommendations for Outpatient Follow-up:  Pcp f/u  BMP 1 weeks    Discharge Diagnoses:  Principal Problem:   Sepsis secondary to UTI [facility]) Active Problems:   Acute kidney injury superimposed on chronic kidney disease (HCC)   Pyelonephritis   Acute colitis   Non-insulin dependent type 2 diabetes mellitus (HCC)   Permanent atrial fibrillation (HCC)   Bilateral nonobstructing nephrolithiasis   Hyperkalemia   History of CVA (cerebrovascular accident)   Age-related physical debility   Discharge Condition: improved  Diet recommendation: heart healthy  Filed Weights   02/27/24 0811  Weight: 103.5 kg    History of present illness:  From admission h and p Charles Richard is a 89 y.o. male with medical history significant of dementia, AAA, permanent atrial fibrillation on Eliquis, DM type II, essential hypertension, impaired hearing, CVA, hyperlipidemia, CKD stage IIIb and history of recurrent UTI status post cystoscopy left-sided stent placement then removal on 02/19/2024 presented to emergency department with complaining of generalized weakness. Family reported at home patient has ongoing weakness.  He is getting physical therapy but remain weak.  He seemed more weaker than before.  He is unable to get off from the toilet by himself.  Denies any fever, nausea, vomiting and abdominal pain.  Patient is having loose stool.   Patient was admitted 3/1 to 3/12 for urosepsis in the setting of left ureteral stone status post stent placement afterward removal on 3/11.  Patient underwent ureteroscopy and laser left ureteral stone removal on 3/11.  Patient has been treated with UTI both bacterial and fungal and completed antibiotic course.    Hospital Course:   Sepsis due to Colitis: Present on admission. Norovirus  Infection: Present on admission Ruled in for sepsis with leukocytosis, tachycardia, norovirus infection on presentation Urinalysis was benign, urine culture is negative CT showed distension of rectum with colonic wall thickening and mild fat stranding (? Stercoral colitis) Blood cultures negative x2 Antibiotics discontinued.   Diarrhea resolved 2 days ago, had normal bm today, tolerating diet no vomiting. Enteric precautionis lifted   AKI on CKD IV-Unclear baseline creatinine.  Possible underlying diabetic nephropathy. creatinine is currently better Follow-up BMP in a.m. Recent CT study with mild prominence of renal collecting system with bilateral nephrolithiasis.  No evidence of any obstruction reported. Kidney function has stabilized with gfr in the upper 20s. Advise bmp one week   Diabetes mellitus type 2-A1c 12/2023 6.5 Diet controlled here, advise hold stlg2i for now given aki on presentation with hyperkalemia   Recent Cystoscopy with L Ureteroscopy, laser lithostripsy, and L ureteral stent exchange 3/10 Stent removed on 3/12 Outpatient urology f/u   Hypertension: Continue with Coreg   Hypomagnesemia and hypokalemia: Repleted as appropriate.  Protocol   "Small bowel" calcification noted on CT imaging Previous hospitalist discussed with radiology, likely calcified artery of the mesentery or renal arteries. No management needed.    Procedures: none   Consultations: none  Discharge Exam: Vitals:   03/02/24 2053 03/03/24 0456  BP: 107/64 105/61  Pulse: 89 (!) 101  Resp: 18 18  Temp: 99.3 F (37.4 C) 99.2 F (37.3 C)  SpO2: 97% 96%    General: NAD Cardiovascular: RRR Respiratory: CTAB Abdomen: soft, non-tender  Discharge Instructions   Discharge Instructions     Diet - low sodium heart healthy   Complete by: As directed  Discharge wound care:   Complete by: As directed    Standard ulcer wound care   Increase activity slowly   Complete by: As directed        Allergies as of 03/03/2024   No Known Allergies      Medication List     STOP taking these medications    sitaGLIPtin 25 MG tablet Commonly known as: JANUVIA   zolpidem 10 MG tablet Commonly known as: AMBIEN       TAKE these medications    apixaban 5 MG Tabs tablet Commonly known as: ELIQUIS Take 2.5 mg by mouth 2 (two) times daily.   carvedilol 6.25 MG tablet Commonly known as: COREG Take 1 tablet (6.25 mg total) by mouth 2 (two) times daily with a meal. What changed: when to take this   gabapentin 300 MG capsule Commonly known as: NEURONTIN Take 1 capsule (300 mg total) by mouth at bedtime.   Glucerna Liqd Take 237 mLs by mouth daily.   latanoprost 0.005 % ophthalmic solution Commonly known as: XALATAN Place 1 drop into both eyes at bedtime.   loratadine 10 MG tablet Commonly known as: CLARITIN Take 10 mg by mouth daily with breakfast.   multivitamin with minerals Tabs tablet Take 1 tablet by mouth daily.   mupirocin ointment 2 % Commonly known as: BACTROBAN Apply 1 Application topically 2 (two) times daily.   nystatin-triamcinolone cream Commonly known as: MYCOLOG II Apply 1 Application topically as needed (sores).   ondansetron 8 MG tablet Commonly known as: ZOFRAN Take 8 mg by mouth daily as needed for nausea or vomiting.   polyethylene glycol 17 g packet Commonly known as: MiraLax Take 17 g by mouth daily as needed.   Propylene Glycol 0.6 % Soln Place 2 drops into both eyes daily as needed (dry eyes).   simvastatin 20 MG tablet Commonly known as: ZOCOR Take 10 mg by mouth daily.   traMADol 50 MG tablet Commonly known as: ULTRAM Take 50 mg by mouth at bedtime as needed for moderate pain (pain score 4-6).   VITAMIN D PO Take 1 capsule by mouth daily.               Discharge Care Instructions  (From admission, onward)           Start     Ordered   03/03/24 0000  Discharge wound care:       Comments: Standard  ulcer wound care   03/03/24 1052           No Known Allergies  Follow-up Information     [facility].   Contact information: [address] [facility] [place] [place] [address] [phone]                  The results of significant diagnostics from this hospitalization (including imaging, microbiology, ancillary and laboratory) are listed below for reference.    Significant Diagnostic Studies: CT Renal Stone Study Result Date: 02/25/2024 CLINICAL DATA:  Urolithiasis, symptomatic. EXAM: CT ABDOMEN AND PELVIS WITHOUT CONTRAST TECHNIQUE: Multidetector CT imaging of the abdomen and pelvis was performed following the standard protocol without IV contrast. RADIATION DOSE REDUCTION: This exam was performed according to the departmental dose-optimization program which includes automated exposure control, adjustment of the mA and/or kV according to patient size and/or use of iterative reconstruction technique. COMPARISON:  01/05/2024. FINDINGS: Lower chest: The heart is normal in size and there is a trace pericardial effusion. Multi-vessel coronary artery calcifications are  noted. Atelectasis is present at the lung bases. Hepatobiliary: No focal liver abnormality is seen. The gallbladder is not seen. No biliary ductal dilatation. Pancreas: Unremarkable. No pancreatic ductal dilatation or surrounding inflammatory changes. Spleen: Normal in size without focal abnormality. Adrenals/Urinary Tract: The adrenal glands are within normal limits. Cysts are noted in the right kidney. There is a hyperdense lesion in the right kidney measuring 1 cm, likely hemorrhagic or proteinaceous cyst. Renal calculi are present bilaterally. There is prominence of the proximal collecting system on the right to the level of the pelvis. No obstructing stone is seen bilaterally. The bladder is within normal limits. Stomach/Bowel: Small hiatal hernia is noted. Stomach is within normal limits.  Appendix appears normal. No bowel obstruction, free air, or pneumatosis is seen. A moderate amount of stool is noted in the rectum with mild rectal wall thickening and surrounding fat stranding. A stable rim calcified lesion is noted in the central small bowel, possible aneurysm. Vascular/Lymphatic: Aortic atherosclerosis. No enlarged abdominal or pelvic lymph nodes. Reproductive: Prostate is unremarkable. Other: No abdominopelvic ascites. A fat containing umbilical hernia is noted. Musculoskeletal: Degenerative changes are present in the thoracolumbar spine. A stable compression deformity/fracture is noted in the superior endplate at T11. No acute osseous abnormality is seen. IMPRESSION: 1. Bilateral nephrolithiasis. There is a mild prominence of the renal collecting system on the right at the level of the pelvis with no evidence of obstructing stone. 2. Distention of the rectum with colonic wall thickening and mild surrounding fat stranding, possible stercoral colitis. 3. Small hiatal hernia. 4. Stable compression deformity/fracture in the T11 vertebral body. 5. Aortic atherosclerosis and coronary artery calcifications Electronically Signed   By: Thornell Sartorius M.D.   On: 02/25/2024 23:32   DG Chest 2 View Result Date: 02/25/2024 CLINICAL DATA:  Weakness EXAM: CHEST - 2 VIEW COMPARISON:  02/09/2024 FINDINGS: Stable cardiomediastinal silhouette. Aortic atherosclerotic calcification. No focal consolidation, pleural effusion, or pneumothorax. No displaced rib fractures. Right reverse TSA. IMPRESSION: No active cardiopulmonary disease. Electronically Signed   By: Minerva Fester M.D.   On: 02/25/2024 21:51   CT Head Wo Contrast Result Date: 02/25/2024 CLINICAL DATA:  Mental status change, unknown cause. EXAM: CT HEAD WITHOUT CONTRAST TECHNIQUE: Contiguous axial images were obtained from the base of the skull through the vertex without intravenous contrast. RADIATION DOSE REDUCTION: This exam was performed  according to the departmental dose-optimization program which includes automated exposure control, adjustment of the mA and/or kV according to patient size and/or use of iterative reconstruction technique. COMPARISON:  05/17/2009. FINDINGS: Brain: No acute intracranial hemorrhage, midline shift or mass effect is seen. Diffuse atrophy is noted. Periventricular white matter hypodensities are present bilaterally. No hydrocephalus. Vascular: No hyperdense vessel or unexpected calcification. Skull: Normal. Negative for fracture or focal lesion. Sinuses/Orbits: No acute finding. Other: None. IMPRESSION: 1. No acute intracranial process. 2. Atrophy with chronic microvascular ischemic changes. Electronically Signed   By: Thornell Sartorius M.D.   On: 02/25/2024 21:40   DG C-Arm 1-60 Min-No Report Result Date: 02/18/2024 Fluoroscopy was utilized by the requesting physician.  No radiographic interpretation.   DG C-Arm 1-60 Min-No Report Result Date: 02/18/2024 Fluoroscopy was utilized by the requesting physician.  No radiographic interpretation.   US RENAL Result Date: 02/12/2024 CLINICAL DATA:  Acute kidney injury EXAM: RENAL / URINARY TRACT ULTRASOUND COMPLETE COMPARISON:  CT 01/05/2024 FINDINGS: Right Kidney: Renal measurements: 11.9 x 6 x 6.8 cm = volume: 252 mL. Echogenicity within normal limits. Mild right hydronephrosis. Cysts measuring  up to 4.4 cm at the upper pole for which no imaging follow-up is recommended. Left Kidney: Renal measurements: 11.8 x 5.7 x 6 cm = volume: 213.3 mL. Poorly visible. Echogenicity probably within normal limits. Mild left hydronephrosis. No discrete mass by sonography. Bladder: Decompressed by catheter Other: None. IMPRESSION: 1. There is mild bilateral hydronephrosis 2. Right renal cysts for which no imaging follow-up is recommended Electronically Signed   By: Jasmine Pang M.D.   On: 02/12/2024 19:39   DG Abdomen 1 View Result Date: 02/09/2024 CLINICAL DATA:  Evaluation for stent  placement. EXAM: ABDOMEN - 1 VIEW COMPARISON:  CT 01/05/2024 FINDINGS: No radiographic evidence of bowel obstruction. Peripherally calcified round lesion in the central mesentery is unchanged from prior CT. Left ureteral stent projects over the expected course of the left ureter. No radiopaque urinary calculi. IMPRESSION: 1. Left ureteral stent projects over the expected course of the left ureter. No radiopaque urinary calculi. Electronically Signed   By: Minerva Fester M.D.   On: 02/09/2024 23:47   DG Chest 2 View Result Date: 02/09/2024 CLINICAL DATA:  Possible sepsis EXAM: CHEST - 2 VIEW COMPARISON:  01/05/2024 FINDINGS: Cardiac shadow is within normal limits. Aortic calcifications are noted. The lungs are well aerated bilaterally. No bony abnormality is noted. Surgical changes in the right shoulder are seen. IMPRESSION: No active cardiopulmonary disease. Electronically Signed   By: Alcide Clever M.D.   On: 02/09/2024 22:59    Microbiology: Recent Results (from the past 240 hours)  Resp panel by RT-PCR (RSV, Flu A&B, Covid) Anterior Nasal Swab     Status: None   Collection Time: 02/25/24  9:29 PM   Specimen: Anterior Nasal Swab  Result Value Ref Range Status   SARS Coronavirus 2 by RT PCR NEGATIVE NEGATIVE Final    Comment: (NOTE) SARS-CoV-2 target nucleic acids are NOT DETECTED.  The SARS-CoV-2 RNA is generally detectable in upper respiratory specimens during the acute phase of infection. The lowest concentration of SARS-CoV-2 viral copies this assay can detect is 138 copies/mL. A negative result does not preclude SARS-Cov-2 infection and should not be used as the sole basis for treatment or other patient management decisions. A negative result may occur with  improper specimen collection/handling, submission of specimen other than nasopharyngeal swab, presence of viral mutation(s) within the areas targeted by this assay, and inadequate number of viral copies(<138 copies/mL). A negative  result must be combined with clinical observations, patient history, and epidemiological information. The expected result is Negative.  Fact Sheet for Patients:  BloggerCourse.com  Fact Sheet for Healthcare Providers:  SeriousBroker.it  This test is no t yet approved or cleared by the [place] FDA and  has been authorized for detection and/or diagnosis of SARS-CoV-2 by FDA under an Emergency Use Authorization (EUA). This EUA will remain  in effect (meaning this test can be used) for the duration of the COVID-19 declaration under Section 564(b)(1) of the Act, 21 U.S.C.section 360bbb-3(b)(1), unless the authorization is terminated  or revoked sooner.       Influenza A by PCR NEGATIVE NEGATIVE Final   Influenza B by PCR NEGATIVE NEGATIVE Final    Comment: (NOTE) The Xpert Xpress SARS-CoV-2/FLU/RSV plus assay is intended as an aid in the diagnosis of influenza from Nasopharyngeal swab specimens and should not be used as a sole basis for treatment. Nasal washings and aspirates are unacceptable for Xpert Xpress SARS-CoV-2/FLU/RSV testing.  Fact Sheet for Patients: BloggerCourse.com  Fact Sheet for Healthcare Providers: SeriousBroker.it  This  test is not yet approved or cleared by the [place] and has been authorized for detection and/or diagnosis of SARS-CoV-2 by FDA under an Emergency Use Authorization (EUA). This EUA will remain in effect (meaning this test can be used) for the duration of the COVID-19 declaration under Section 564(b)(1) of the Act, 21 U.S.C. section 360bbb-3(b)(1), unless the authorization is terminated or revoked.     Resp Syncytial Virus by PCR NEGATIVE NEGATIVE Final    Comment: (NOTE) Fact Sheet for Patients: BloggerCourse.com  Fact Sheet for Healthcare Providers: SeriousBroker.it  This  test is not yet approved or cleared by the [place] FDA and has been authorized for detection and/or diagnosis of SARS-CoV-2 by FDA under an Emergency Use Authorization (EUA). This EUA will remain in effect (meaning this test can be used) for the duration of the COVID-19 declaration under Section 564(b)(1) of the Act, 21 U.S.C. section 360bbb-3(b)(1), unless the authorization is terminated or revoked.  Performed at [facility], [address], [place], [place] [address]   Culture, blood (routine x 2)     Status: None   Collection Time: 02/25/24 11:10 PM   Specimen: BLOOD  Result Value Ref Range Status   Specimen Description   Final    BLOOD RIGHT ANTECUBITAL Performed at [facility], [address], [place], [place] [address]    Special Requests   Final    BOTTLES DRAWN AEROBIC AND ANAEROBIC Blood Culture results may not be optimal due to an inadequate volume of blood received in culture bottles Performed at [facility], [address] [place], [place] [address]    Culture   Final    NO GROWTH 5 DAYS Performed at [facility], [address], [place], [place] [address]    Report Status 03/02/2024 FINAL  Final  Urine Culture     Status: None   Collection Time: 02/26/24 12:28 AM   Specimen: Urine, Random  Result Value Ref Range Status   Specimen Description   Final    URINE, RANDOM Performed at [facility], [address] [place], [place] [address]    Special Requests   Final    NONE Reflexed from [id number] Performed at [facility], [address] [place], [place] [address]    Culture   Final    NO GROWTH Performed at [facility], [address] [place], [place] [address]    Report Status 02/27/2024 FINAL  Final  Culture, blood (routine x 2)     Status: None   Collection Time: 02/26/24  3:18 PM   Specimen: BLOOD LEFT HAND  Result Value Ref Range Status    Specimen Description   Final    BLOOD LEFT HAND Performed at [facility], [address] [place], [place] [address]    Special Requests   Final    BOTTLES DRAWN AEROBIC ONLY Blood Culture results may not be optimal due to an inadequate volume of blood received in culture bottles Performed at [facility], [address], [place], [place] [address]    Culture   Final    NO GROWTH 5 DAYS Performed at [facility], [address], [place], [place] [address]    Report Status 03/02/2024 FINAL  Final  Gastrointestinal Panel by PCR , Stool     Status: Abnormal   Collection Time: 02/26/24  5:17 PM   Specimen: Stool  Result Value  Ref Range Status   Campylobacter species NOT DETECTED NOT DETECTED Final   Plesimonas shigelloides NOT DETECTED NOT DETECTED Final   Salmonella species NOT DETECTED NOT DETECTED Final   Yersinia enterocolitica NOT DETECTED NOT DETECTED Final   Vibrio species NOT DETECTED NOT DETECTED Final   Vibrio cholerae NOT DETECTED NOT DETECTED Final   Enteroaggregative E coli (EAEC) NOT DETECTED NOT DETECTED Final   Enteropathogenic E coli (EPEC) NOT DETECTED NOT DETECTED Final   Enterotoxigenic E coli (ETEC) NOT DETECTED NOT DETECTED Final   Shiga like toxin producing E coli (STEC) NOT DETECTED NOT DETECTED Final   Shigella/Enteroinvasive E coli (EIEC) NOT DETECTED NOT DETECTED Final   Cryptosporidium NOT DETECTED NOT DETECTED Final   Cyclospora cayetanensis NOT DETECTED NOT DETECTED Final   Entamoeba histolytica NOT DETECTED NOT DETECTED Final   Giardia lamblia NOT DETECTED NOT DETECTED Final   Adenovirus F40/41 NOT DETECTED NOT DETECTED Final   Astrovirus NOT DETECTED NOT DETECTED Final   Norovirus GI/GII DETECTED (A) NOT DETECTED Final    Comment: RESULT CALLED TO, READ BACK BY AND VERIFIED WITH: LINDSAY MORGAN 1634 02/27/24 MU    Rotavirus A NOT DETECTED NOT DETECTED Final   Sapovirus (I, II, IV, and V) NOT DETECTED NOT DETECTED  Final    Comment: Performed at [facility], [address] [place], [place] [address]  C Difficile Quick Screen w PCR reflex     Status: None   Collection Time: 02/26/24  5:17 PM   Specimen: Stool  Result Value Ref Range Status   C Diff antigen NEGATIVE NEGATIVE Final   C Diff toxin NEGATIVE NEGATIVE Final   C Diff interpretation No C. difficile detected.  Final    Comment: Performed at [facility], [address], [place], [place] [address]     Labs: Basic Metabolic Panel: Recent Labs  Lab 02/26/24 0726 02/27/24 0604 02/28/24 0557 02/29/24 0530 03/01/24 0640 03/03/24 0514  NA 142 141 139 143  --  140  K 4.5 3.4* 3.0* 3.8  --  5.1  CL 110 103 103 108  --  105  CO2 18* 21* 24 22  --  27  GLUCOSE 105* 82 112* 97  --  143*  BUN 47* 42* 43* 39*  --  43*  CREATININE 2.99* 2.65* 2.42* 2.28*  --  2.24*  CALCIUM 8.5* 8.1* 8.0* 8.2*  --  8.4*  MG  --   --  1.5* 1.4* 2.0  --   PHOS  --   --  2.8 2.0*  --   --    Liver Function Tests: Recent Labs  Lab 02/25/24 2156 02/26/24 0726 02/28/24 0557  AST 21 16 15   ALT 19 15 13   ALKPHOS 45 36* 35*  BILITOT 0.9 1.0 0.8  PROT 7.2 6.1* 5.7*  ALBUMIN 2.8* 2.2* 2.0*   No results for input(s): "LIPASE", "AMYLASE" in the last 168 hours. No results for input(s): "AMMONIA" in the last 168 hours. CBC: Recent Labs  Lab 02/25/24 2156 02/26/24 0726 02/27/24 0604 02/28/24 0557 02/29/24 0530 03/03/24 0514  WBC 22.4* 18.6* 15.1* 10.8* 8.5 8.4  NEUTROABS 19.6*  --   --  7.8*  --   --   HGB 10.7* 9.5* 8.8* 9.6* 9.9* 9.9*  HCT 33.8* 31.9* 29.4* 31.7* 32.5* 32.1*  MCV 97.7 101.9* 101.7* 100.6* 101.2* 101.3*  PLT 375 321 284 280 253 248   Cardiac Enzymes: No results for input(s): "CKTOTAL", "CKMB", "CKMBINDEX", "TROPONINI" in the last  168 hours. BNP: BNP (last 3 results) Recent Labs    02/14/24 0355  BNP 277.2*    ProBNP (last 3 results) No results for input(s): "PROBNP" in the last 8760  hours.  CBG: Recent Labs  Lab 03/02/24 0735 03/02/24 1139 03/02/24 1700 03/02/24 2055 03/03/24 0831  GLUCAP 122* 181* 164* 127* 137*       Signed:  Silvano Bilis MD.  [facility] 03/03/2024, 10:53 AM

## 2024-03-03 NOTE — Progress Notes (Signed)
 Report is given to RN Chelsy in [facility] in [place].

## 2024-03-03 NOTE — TOC Transition Note (Signed)
 Transition of Care [facility]) - Discharge Note   Patient Details  Name: Charles Richard MRN: [id number] Date of Birth: 10/15/1930  [facility] CM/SW Contact:  Otelia Santee, LCSW Phone Number: 03/03/2024, 1:25 PM   Clinical Narrative:    Pt to transfer to [facility] [place] for [facility] SN[facility]. Pt will be going to room 208. RN to call report to [phone]. DC packet with signed DNR placed at RN station. Spoke with pt's daughter and confirmed discharge plans. [facility] called at 1:21pm for transportation.    Final next level of care: Skilled Nursing Facility Barriers to Discharge: Barriers Resolved   Patient Goals and CMS Choice Patient states their goals for this hospitalization and ongoing recovery are:: Return home with daughter and [facility]          Discharge Placement PASRR number recieved: 02/28/24            Patient chooses bed at: [facility] [place] Patient to be transferred to facility by: [facility] Name of family member notified: Daughter, [name] Patient and family notified of of transfer: 03/03/24  Discharge Plan and Services Additional resources added to the After Visit Summary for     Discharge Planning Services: CM Consult            DME Arranged: N/A DME Agency: NA                  Social Drivers of Health (SDOH) Interventions SDOH Screenings   Food Insecurity: No Food Insecurity (02/27/2024)  Housing: Patient Declined (02/27/2024)  Transportation Needs: No Transportation Needs (02/27/2024)  Utilities: Not At Risk (02/27/2024)  Social Connections: Patient Unable To Answer (02/27/2024)  Tobacco Use: Low Risk  (02/26/2024)     Readmission Risk Interventions    02/10/2024   12:16 PM 01/07/2024   10:49 AM  Readmission Risk Prevention Plan  Transportation Screening Complete Complete  PCP or Specialist Appt within 3-5 Days Complete   HRI or Home Care Consult Complete Complete  Social Work Consult for Recovery Care Planning/Counseling Complete  Complete  Palliative Care Screening Not Applicable Not Applicable  Medication Review Oceanographer) Complete Complete

## 2024-03-03 NOTE — Progress Notes (Signed)
 Physical Therapy Treatment Patient Details Name: Charles Richard MRN: [id number] DOB: 08/20/1930 Today's Date: 03/03/2024   History of Present Illness 89 year old presents to the ED on 02/25/24 after a recent hospitalization on 02/09/24 via due to fever, dysuria, and cough and admitted for urosepsis. Pt returned to [facility] due to  generalized weakness. Admitted with Sepsis secondary to UTI and norovirus.  PMHx: dementia, AAA, permanent A-fib on Eliquis, DM 2, HTN, impaired hearing, CVA, HLD, CKD 3B.  Pt with recent admission 1/25 - 01/09/2024 for obstructive renal stone requiring emergent cystoscopy and left-sided stent placement.  Eventually discharged on 10 days of oral antibiotics and Foley catheter.    PT Comments  Pt able to come to sit EOB with CGA, but once sitting has decreased stability in the presence of a posterior lean. He is able to regain his posture over time, dons 2nd gown. Pt requires max A x2 for standing and feels urges to have BM once standing. Pt is able to complete multiple trials, but clearly fatigues with continued reps of STS. We were able to transfer to the [facility] where he had further BM, stand pivot completed to and from [facility] at max A x2. Pt unable to side step towards [facility] but stands and pivots hips to place his sitting position closer to [facility]. Pt returns to supine with LE and trunk assist, scooted up in bed and bed placed in chair position. Based on pt PLOF, level of support, and current functional status, he would benefit from skilled PT <3 hours per day at dc.      If plan is discharge home, recommend the following: Assistance with cooking/housework;Assist for transportation;A lot of help with walking and/or transfers;Two people to help with walking and/or transfers;A lot of help with bathing/dressing/bathroom;Help with stairs or ramp for entrance   Can travel by private vehicle     No  Equipment Recommendations  None recommended by PT    Recommendations for Other Services        Precautions / Restrictions Precautions Precautions: Fall Recall of Precautions/Restrictions: Intact Precaution/Restrictions Comments: likes to wear his shoes (grip). Enteric Precautions Restrictions Weight Bearing Restrictions Per Provider Order: No     Mobility  Bed Mobility Overal bed mobility: Needs Assistance Bed Mobility: Sit to Supine     Supine to sit: Max assist, Used rails, Mod assist Sit to supine: Max assist   General bed mobility comments: able to bring LE over edge, once sitting EOB requires min A sitting EOB for balance as pt leans posteriorly    Transfers Overall transfer level: Needs assistance Equipment used: Rolling walker (2 wheels) Transfers: Sit to/from Stand Sit to Stand: +2 physical assistance, Max assist Stand pivot transfers: Max assist, +2 physical assistance         General transfer comment: pt has frequent urge for BM which dec tolerance to standing and mobility    Ambulation/Gait                   Stairs             Wheelchair Mobility     Tilt Bed    Modified Rankin (Stroke Patients Only)       Balance Overall balance assessment: Needs assistance Sitting-balance support: Feet supported, Single extremity supported Sitting balance-Leahy Scale: Poor Sitting balance - Comments: able to changes gowns sitting EOB with weight shifting and intermittent HHA, requires CGA for posterior leaning   Standing balance support: Bilateral upper extremity supported, Reliant on assistive  device for balance, During functional activity Standing balance-Leahy Scale: Zero Standing balance comment: pt has decreased tolerance to upright positioning, flexes trunk, has several BMs.                            Communication Communication Communication: Impaired Factors Affecting Communication: Hearing impaired  Cognition Arousal: Alert Behavior During Therapy: WFL for tasks assessed/performed   PT - Cognitive  impairments: No apparent impairments                       PT - Cognition Comments: AxO x 3 pleasant and willing.  Lives home with Aide M-F 9 -5 while daughter is at work.  Then rotating family support nights/weekends.  VERY HOH even with one R hearing aide Following commands: Intact      Cueing Cueing Techniques: Verbal cues, Gestural cues, Tactile cues, Visual cues  Exercises      General Comments General comments (skin integrity, edema, etc.): continues to have BMs once standing      Pertinent Vitals/Pain Pain Assessment Pain Assessment: No/denies pain    Home Living                          Prior Function            PT Goals (current goals can now be found in the care plan section) Acute Rehab PT Goals Patient Stated Goal: improve strength to return home PT Goal Formulation: With patient/family Time For Goal Achievement: 03/13/24 Potential to Achieve Goals: Good Progress towards PT goals: Progressing toward goals    Frequency           PT Plan      Co-evaluation              AM-PAC PT "6 Clicks" Mobility   Outcome Measure  Help needed turning from your back to your side while in a flat bed without using bedrails?: A Little Help needed moving from lying on your back to sitting on the side of a flat bed without using bedrails?: A Lot Help needed moving to and from a bed to a chair (including a wheelchair)?: Total Help needed standing up from a chair using your arms (e.g., wheelchair or bedside chair)?: Total Help needed to walk in hospital room?: Total Help needed climbing 3-5 steps with a railing? : Total 6 Click Score: 9    End of Session Equipment Utilized During Treatment: Gait belt Activity Tolerance: Patient limited by fatigue Patient left: with family/visitor present;in bed;with call bell/phone within reach (chair position) Nurse Communication: Mobility status PT Visit Diagnosis: Muscle weakness (generalized)  (M62.81);Difficulty in walking, not elsewhere classified (R26.2);Unsteadiness on feet (R26.81);Other abnormalities of gait and mobility (R26.89)     Time: 6578-4696 PT Time Calculation (min) (ACUTE ONLY): 44 min  Charges:    $Therapeutic Activity: 38-52 mins PT General Charges $$ ACUTE PT VISIT: 1 Visit                     Charles Richard, PT Acute Rehabilitation Services Office: [phone] 03/03/2024    Charles Richard 03/03/2024, 10:37 AM

## 2024-04-10 DEATH — deceased
